# Patient Record
Sex: Female | Born: 1940 | Race: White | Hispanic: No | Marital: Married | Smoking: Never smoker
Health system: Southern US, Community
[De-identification: ages and names within clinical notes are randomized; demographics above are authoritative.]

## PROBLEM LIST (undated history)

## (undated) DIAGNOSIS — S42292D Other displaced fracture of upper end of left humerus, subsequent encounter for fracture with routine healing: Secondary | ICD-10-CM

## (undated) DIAGNOSIS — C439 Malignant melanoma of skin, unspecified: Secondary | ICD-10-CM

## (undated) DIAGNOSIS — G20 Parkinson's disease: Secondary | ICD-10-CM

## (undated) DIAGNOSIS — C50919 Malignant neoplasm of unspecified site of unspecified female breast: Secondary | ICD-10-CM

## (undated) DIAGNOSIS — G20A1 Parkinson's disease without dyskinesia, without mention of fluctuations: Secondary | ICD-10-CM

## (undated) DIAGNOSIS — C4499 Other specified malignant neoplasm of skin, unspecified: Secondary | ICD-10-CM

## (undated) DIAGNOSIS — N952 Postmenopausal atrophic vaginitis: Secondary | ICD-10-CM

## (undated) DIAGNOSIS — C50412 Malignant neoplasm of upper-outer quadrant of left female breast: Secondary | ICD-10-CM

## (undated) DIAGNOSIS — Z923 Personal history of irradiation: Secondary | ICD-10-CM

## (undated) DIAGNOSIS — R232 Flushing: Secondary | ICD-10-CM

## (undated) DIAGNOSIS — I639 Cerebral infarction, unspecified: Secondary | ICD-10-CM

## (undated) DIAGNOSIS — K219 Gastro-esophageal reflux disease without esophagitis: Secondary | ICD-10-CM

## (undated) DIAGNOSIS — I10 Essential (primary) hypertension: Secondary | ICD-10-CM

## (undated) DIAGNOSIS — N3281 Overactive bladder: Secondary | ICD-10-CM

## (undated) DIAGNOSIS — E7800 Pure hypercholesterolemia, unspecified: Secondary | ICD-10-CM

## (undated) HISTORY — DX: Parkinson's disease without dyskinesia, without mention of fluctuations: G20.A1

## (undated) HISTORY — PX: BRAIN SURGERY: SHX531

## (undated) HISTORY — DX: Other displaced fracture of upper end of left humerus, subsequent encounter for fracture with routine healing: S42.292D

## (undated) HISTORY — DX: Overactive bladder: N32.81

## (undated) HISTORY — DX: Malignant melanoma of skin, unspecified: C43.9

## (undated) HISTORY — DX: Flushing: R23.2

## (undated) HISTORY — DX: Other specified malignant neoplasm of skin, unspecified: C44.99

## (undated) HISTORY — DX: Parkinson's disease: G20

## (undated) HISTORY — DX: Essential (primary) hypertension: I10

## (undated) HISTORY — PX: OTHER SURGICAL HISTORY: SHX169

## (undated) HISTORY — DX: Pure hypercholesterolemia, unspecified: E78.00

## (undated) HISTORY — DX: Malignant neoplasm of upper-outer quadrant of left female breast: C50.412

## (undated) HISTORY — DX: Malignant neoplasm of unspecified site of unspecified female breast: C50.919

## (undated) HISTORY — DX: Postmenopausal atrophic vaginitis: N95.2

## (undated) HISTORY — PX: TONSILLECTOMY: SUR1361

---

## 1997-12-06 ENCOUNTER — Other Ambulatory Visit: Admission: RE | Admit: 1997-12-06 | Discharge: 1997-12-06 | Payer: Self-pay | Admitting: Obstetrics and Gynecology

## 1998-08-11 DIAGNOSIS — S42292D Other displaced fracture of upper end of left humerus, subsequent encounter for fracture with routine healing: Secondary | ICD-10-CM

## 1998-08-11 HISTORY — DX: Other displaced fracture of upper end of left humerus, subsequent encounter for fracture with routine healing: S42.292D

## 1998-12-10 ENCOUNTER — Other Ambulatory Visit: Admission: RE | Admit: 1998-12-10 | Discharge: 1998-12-10 | Payer: Self-pay | Admitting: Obstetrics and Gynecology

## 1999-12-19 ENCOUNTER — Other Ambulatory Visit: Admission: RE | Admit: 1999-12-19 | Discharge: 1999-12-19 | Payer: Self-pay | Admitting: Obstetrics and Gynecology

## 2000-02-25 ENCOUNTER — Encounter: Payer: Self-pay | Admitting: Obstetrics and Gynecology

## 2000-02-25 ENCOUNTER — Encounter: Admission: RE | Admit: 2000-02-25 | Discharge: 2000-02-25 | Payer: Self-pay | Admitting: Obstetrics and Gynecology

## 2000-12-29 ENCOUNTER — Other Ambulatory Visit: Admission: RE | Admit: 2000-12-29 | Discharge: 2000-12-29 | Payer: Self-pay | Admitting: Obstetrics and Gynecology

## 2001-03-02 ENCOUNTER — Encounter: Admission: RE | Admit: 2001-03-02 | Discharge: 2001-03-02 | Payer: Self-pay | Admitting: Obstetrics and Gynecology

## 2001-03-02 ENCOUNTER — Encounter: Payer: Self-pay | Admitting: Obstetrics and Gynecology

## 2001-12-30 ENCOUNTER — Other Ambulatory Visit: Admission: RE | Admit: 2001-12-30 | Discharge: 2001-12-30 | Payer: Self-pay | Admitting: Obstetrics and Gynecology

## 2002-03-07 ENCOUNTER — Encounter: Payer: Self-pay | Admitting: Obstetrics and Gynecology

## 2002-03-07 ENCOUNTER — Encounter: Admission: RE | Admit: 2002-03-07 | Discharge: 2002-03-07 | Payer: Self-pay | Admitting: Obstetrics and Gynecology

## 2002-08-11 HISTORY — PX: FOOT SURGERY: SHX648

## 2003-01-02 ENCOUNTER — Other Ambulatory Visit: Admission: RE | Admit: 2003-01-02 | Discharge: 2003-01-02 | Payer: Self-pay | Admitting: Obstetrics and Gynecology

## 2003-03-09 ENCOUNTER — Encounter: Admission: RE | Admit: 2003-03-09 | Discharge: 2003-03-09 | Payer: Self-pay | Admitting: Obstetrics and Gynecology

## 2003-03-09 ENCOUNTER — Encounter: Payer: Self-pay | Admitting: Obstetrics and Gynecology

## 2004-01-10 ENCOUNTER — Other Ambulatory Visit: Admission: RE | Admit: 2004-01-10 | Discharge: 2004-01-10 | Payer: Self-pay | Admitting: Obstetrics and Gynecology

## 2004-02-14 ENCOUNTER — Other Ambulatory Visit: Admission: RE | Admit: 2004-02-14 | Discharge: 2004-02-14 | Payer: Self-pay | Admitting: Obstetrics and Gynecology

## 2004-03-18 ENCOUNTER — Encounter: Admission: RE | Admit: 2004-03-18 | Discharge: 2004-03-18 | Payer: Self-pay | Admitting: Obstetrics and Gynecology

## 2004-08-06 ENCOUNTER — Other Ambulatory Visit: Admission: RE | Admit: 2004-08-06 | Discharge: 2004-08-06 | Payer: Self-pay | Admitting: Obstetrics and Gynecology

## 2005-01-28 ENCOUNTER — Other Ambulatory Visit: Admission: RE | Admit: 2005-01-28 | Discharge: 2005-01-28 | Payer: Self-pay | Admitting: Obstetrics and Gynecology

## 2005-04-08 ENCOUNTER — Encounter: Admission: RE | Admit: 2005-04-08 | Discharge: 2005-04-08 | Payer: Self-pay | Admitting: Obstetrics and Gynecology

## 2005-08-11 HISTORY — PX: KNEE SURGERY: SHX244

## 2006-02-02 ENCOUNTER — Other Ambulatory Visit: Admission: RE | Admit: 2006-02-02 | Discharge: 2006-02-02 | Payer: Self-pay | Admitting: Obstetrics and Gynecology

## 2006-02-02 ENCOUNTER — Encounter: Payer: Self-pay | Admitting: Vascular Surgery

## 2006-02-02 ENCOUNTER — Ambulatory Visit (HOSPITAL_COMMUNITY): Admission: RE | Admit: 2006-02-02 | Discharge: 2006-02-02 | Payer: Self-pay | Admitting: Obstetrics and Gynecology

## 2006-02-13 ENCOUNTER — Ambulatory Visit (HOSPITAL_COMMUNITY): Admission: RE | Admit: 2006-02-13 | Discharge: 2006-02-13 | Payer: Self-pay | Admitting: Orthopedic Surgery

## 2006-02-13 ENCOUNTER — Encounter (INDEPENDENT_AMBULATORY_CARE_PROVIDER_SITE_OTHER): Payer: Self-pay | Admitting: *Deleted

## 2006-05-06 ENCOUNTER — Encounter: Admission: RE | Admit: 2006-05-06 | Discharge: 2006-05-06 | Payer: Self-pay | Admitting: Obstetrics and Gynecology

## 2007-04-28 ENCOUNTER — Other Ambulatory Visit: Admission: RE | Admit: 2007-04-28 | Discharge: 2007-04-28 | Payer: Self-pay | Admitting: Obstetrics and Gynecology

## 2007-05-26 ENCOUNTER — Encounter: Admission: RE | Admit: 2007-05-26 | Discharge: 2007-05-26 | Payer: Self-pay | Admitting: Obstetrics and Gynecology

## 2007-10-07 ENCOUNTER — Ambulatory Visit: Payer: Self-pay | Admitting: Cardiology

## 2008-06-07 ENCOUNTER — Encounter: Admission: RE | Admit: 2008-06-07 | Discharge: 2008-06-07 | Payer: Self-pay | Admitting: Obstetrics and Gynecology

## 2008-06-07 ENCOUNTER — Ambulatory Visit: Payer: Self-pay | Admitting: Obstetrics and Gynecology

## 2009-06-08 ENCOUNTER — Encounter: Payer: Self-pay | Admitting: Obstetrics and Gynecology

## 2009-06-08 ENCOUNTER — Ambulatory Visit: Payer: Self-pay | Admitting: Obstetrics and Gynecology

## 2009-06-08 ENCOUNTER — Encounter: Admission: RE | Admit: 2009-06-08 | Discharge: 2009-06-08 | Payer: Self-pay | Admitting: Obstetrics and Gynecology

## 2009-06-08 ENCOUNTER — Other Ambulatory Visit: Admission: RE | Admit: 2009-06-08 | Discharge: 2009-06-08 | Payer: Self-pay | Admitting: Obstetrics and Gynecology

## 2010-03-27 ENCOUNTER — Ambulatory Visit: Payer: Self-pay | Admitting: Women's Health

## 2010-04-11 ENCOUNTER — Ambulatory Visit: Payer: Self-pay | Admitting: Obstetrics and Gynecology

## 2010-04-29 ENCOUNTER — Ambulatory Visit: Payer: Self-pay | Admitting: Obstetrics and Gynecology

## 2010-06-11 ENCOUNTER — Ambulatory Visit: Payer: Self-pay | Admitting: Obstetrics and Gynecology

## 2010-07-01 ENCOUNTER — Encounter: Admission: RE | Admit: 2010-07-01 | Discharge: 2010-07-01 | Payer: Self-pay | Admitting: Obstetrics and Gynecology

## 2010-07-12 ENCOUNTER — Encounter: Admission: RE | Admit: 2010-07-12 | Discharge: 2010-07-12 | Payer: Self-pay | Admitting: Obstetrics and Gynecology

## 2010-07-12 IMAGING — MG MM DIGITAL DIAGNOSTIC LIMITED*R*
2 series · 2 of 2 positions shown · non-contrast
Comparison: Previous examinations, including the screening
mammogram dated [DATE].

CLINICAL DATA: Possible right breast mass at recent screening
mammography.

 RIGHT DIGITAL DIAGNOSTIC MAMMOGRAM

[R CC]
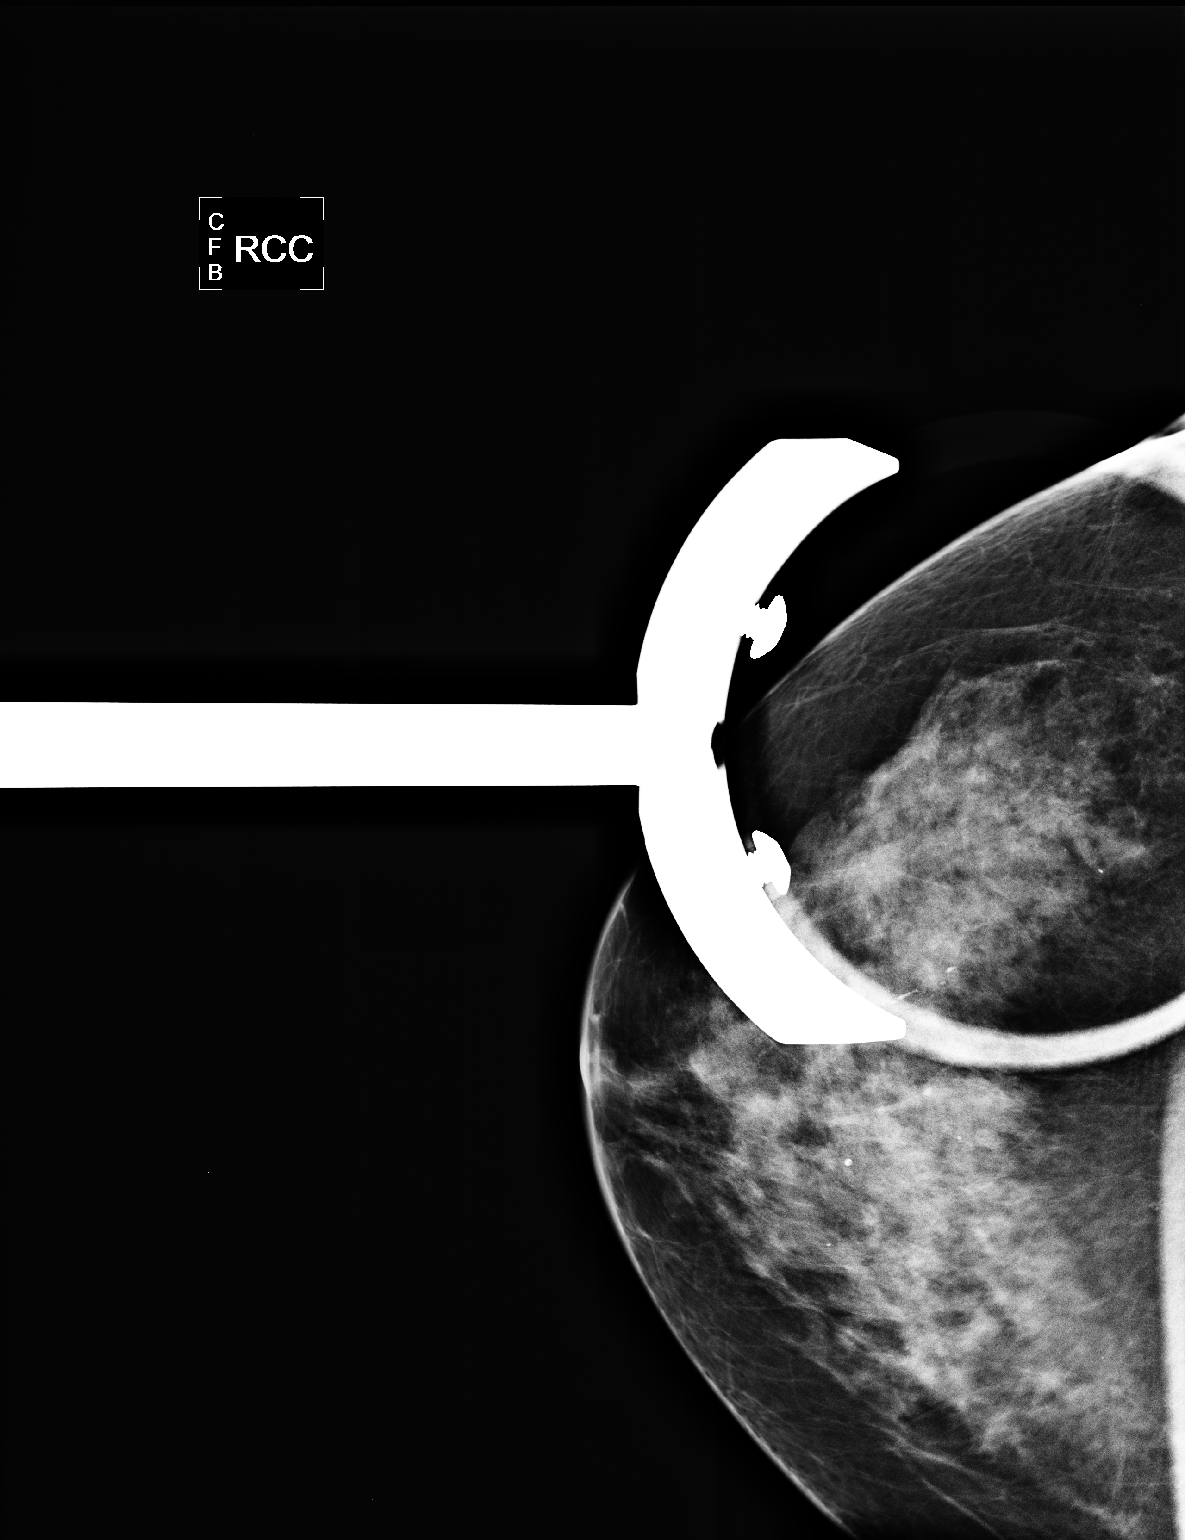

[R MLO]
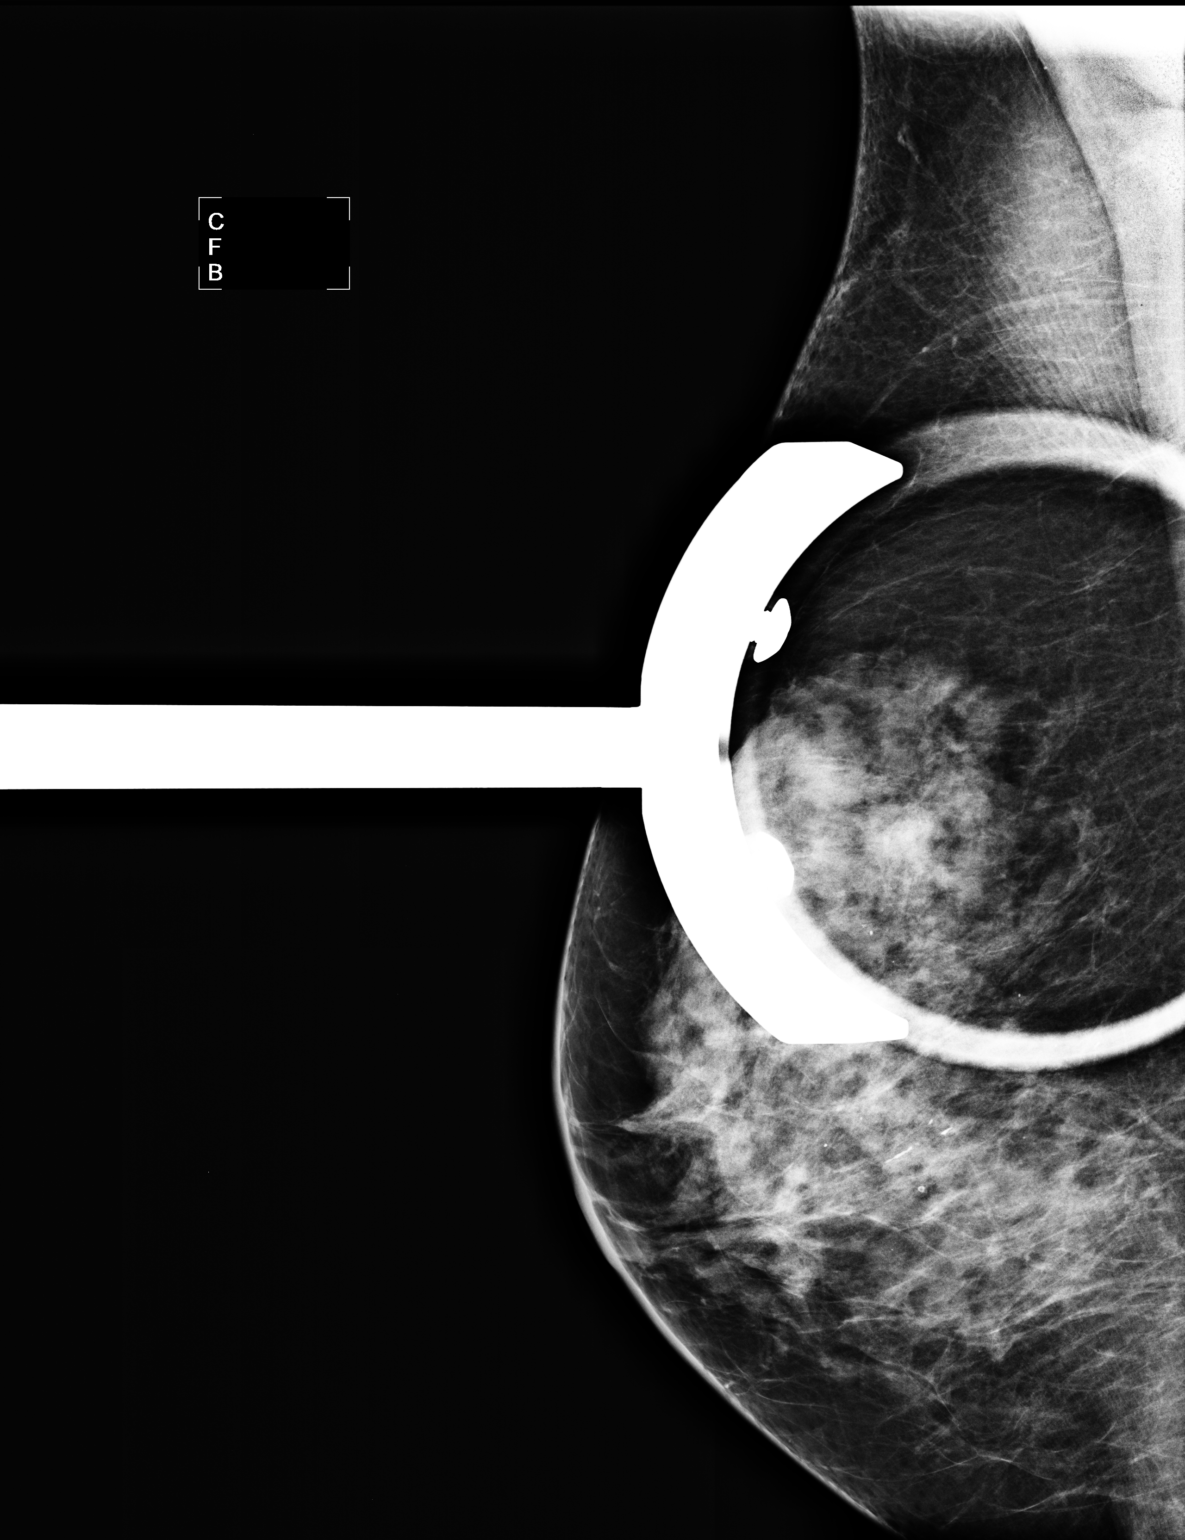

[2 of 2 positions shown; findings below may reference images not displayed]

FINDINGS: Spot compression views of the right breast demonstrate
normal appearing parenchyma at the location of the recently
suspected mass.  No true mass or other findings suspicious for
malignancy are seen.
IMPRESSION: No evidence of malignancy.  The recently suspected right breast
mass represented overlapping of normal tissue.  Annual screening
mammography is recommended.

Bi-Rads Category 1: Negative

## 2010-09-01 ENCOUNTER — Encounter: Payer: Self-pay | Admitting: Obstetrics and Gynecology

## 2010-12-24 NOTE — Assessment & Plan Note (Signed)
[facility]                            CARDIOLOGY OFFICE NOTE   ALANIA, OVERHOLT                      MRN:          [id number]  DATE:10/07/2007                            DOB:          [birth year]    PRIMARY:  Paulita Cradle, nurse practitioner.   REASON FOR PRESENTATION:  Evaluate the patient with chest discomfort and  palpitations.   HISTORY OF PRESENT ILLNESS:  The patient is 70 year old white female.  She had been having some chest discomfort on and off for several days.  She presented last Thursday to [place].  She described a dull  discomfort.  It was somewhat sore for her to move.  It was substernal.  It was episodic.  It was moderate.  It was not associated with nausea,  vomiting or diaphoresis.  It did radiate very slightly into her neck and  down into her arms.  She could not bring this on with activity.  She  dances and was able to do this without bringing it on.  She was given  Nexium which she has been taking for a week.  Said around Monday this  discomfort stopped.  She was able to dance since then again not having  any of this discomfort.  She is also describing palpitations apparently.  However, she does not describe it as such.  She describes it rather to  me, as the chest discomfort.  She is not having any presyncope or  syncope.  She has had a monitor.  She has pressed this 7 times and has  been told It was okay.   PAST MEDICAL HISTORY:  Hyperlipidemia x 8 years, hypertension x 20  years, melanoma recently resected from both legs.   PAST SURGICAL HISTORY:  Left knee torn ligament, melanoma resected as  above.   ALLERGIES:  None.   MEDICATIONS:  Glucosamine, calcium, aspirin 81 mg daily, lisinopril 10  mg daily, simvastatin 40 mg, Nexium 40 mg daily, doxycycline, Ditropan.   SOCIAL HISTORY:  The patient retired.  She is married.  She has no  children.  She has never smoked cigarettes and does not drink  alcohol.   FAMILY HISTORY:  Noncontributory for early coronary artery disease.  Mother died of CVA at 6.   REVIEW OF SYSTEMS:  As stated in the HPI, positive for mild cough,  positive for hiatal hernia, positive for mild edema and lower extremity.  Negative for all other systems.   PHYSICAL EXAMINATION:  The patient is in no distress.  Blood pressure 125 of 70, heart rate 88 and regular, weight 160 pounds,  body mass index 28.  HEENT:  Eyelids unremarkable.  Pupils are equal, round, and reactive to  light and accommodation.  Fundi are not visualized.  Oral mucosa  unremarkable.  NECK:  No jugular venous distension at 45 degrees, carotid upstroke  brisk and symmetric, no bruits, thyromegaly.  LYMPHATICS:  No cervical, axillary, or inguinal adenopathy.  LUNGS:  Clear to auscultation bilaterally.  BACK:  No costovertebral angle tenderness.  CHEST:  Unremarkable.  HEART:  PMI not displaced or sustained, S1  and S2 within normal limits,  no S3, no S4, no clicks, rubs, murmurs.  ABDOMEN:  Flat, positive bowel sounds, normal in frequency and pitch, no  bruits, rebound, guarding.  No midline pulsatile masses, hepatomegaly,  splenomegaly.  SKIN:  No rashes, no nodules.  EXTREMITIES:  With 2+ pulses throughout, no edema, cyanosis, clubbing.  NEURO:  Oriented to person, place, and time, cranial nerves 2-12 grossly  intact, motor grossly intact.    EKG sinus rhythm, rate 84, axis within normal limits, intervals within  normal limits, poor anterior R wave progression, no specific ST-T wave  changes.   ASSESSMENT/PLAN:  1. Chest discomfort, the patient's chest discomfort is atypical.      Actually went away with Nexium.  I think the pretest probability of      obstructive coronary disease is low.  However, given her risk      factors,  I think that exercising her with a POET (plain old      exercise treadmill test) would be reasonable.  She can have this      arranged through [facility].  2. Palpitations.  She did she not had any apparent events of abnormal      rhythm noted though she is press the device 7 times.  I would be      happy reviewed the strips.  I think she could discontinue this at      about 2 weeks if it has been unrevealing.  3. Dyslipidemia per Dr. Christell Constant and Paulita Cradle.  4. Hypertension a blood pressure is elevated today.  However, states      at home it is 120s over 70s.  She probably does have some white      coat hypertension.  Looked at her last readings at [facility] and it is fine.  Therefore, she will continue      medications as listed.  5. Weight.  She does have body mass index that puts her in overweight      range.  She understands in the lose weight with an exercise.  6. Follow-up will be as needed.     Rollene Rotunda, MD, [facility]  Electronically Signed    JH/MedQ  DD: 10/07/2007  DT: 10/08/2007  Job #: [id number]   cc:   Paulita Cradle, NP

## 2011-06-03 ENCOUNTER — Encounter: Payer: Self-pay | Admitting: Obstetrics and Gynecology

## 2011-06-06 ENCOUNTER — Other Ambulatory Visit: Payer: Self-pay | Admitting: Obstetrics and Gynecology

## 2011-06-06 DIAGNOSIS — Z1231 Encounter for screening mammogram for malignant neoplasm of breast: Secondary | ICD-10-CM

## 2011-06-09 ENCOUNTER — Encounter: Payer: Self-pay | Admitting: Obstetrics and Gynecology

## 2011-07-09 ENCOUNTER — Encounter: Payer: Self-pay | Admitting: *Deleted

## 2011-07-09 DIAGNOSIS — N952 Postmenopausal atrophic vaginitis: Secondary | ICD-10-CM | POA: Insufficient documentation

## 2011-07-09 DIAGNOSIS — I10 Essential (primary) hypertension: Secondary | ICD-10-CM | POA: Insufficient documentation

## 2011-07-09 DIAGNOSIS — N3281 Overactive bladder: Secondary | ICD-10-CM | POA: Insufficient documentation

## 2011-07-09 DIAGNOSIS — C439 Malignant melanoma of skin, unspecified: Secondary | ICD-10-CM | POA: Insufficient documentation

## 2011-07-09 DIAGNOSIS — C4499 Other specified malignant neoplasm of skin, unspecified: Secondary | ICD-10-CM | POA: Insufficient documentation

## 2011-07-16 ENCOUNTER — Other Ambulatory Visit (HOSPITAL_COMMUNITY)
Admission: RE | Admit: 2011-07-16 | Discharge: 2011-07-16 | Disposition: A | Discharge status: Home / Self Care | Payer: Medicare Other | Source: Ambulatory Visit | Attending: Obstetrics and Gynecology | Admitting: Obstetrics and Gynecology

## 2011-07-16 ENCOUNTER — Encounter: Payer: Self-pay | Admitting: Obstetrics and Gynecology

## 2011-07-16 ENCOUNTER — Ambulatory Visit (INDEPENDENT_AMBULATORY_CARE_PROVIDER_SITE_OTHER): Payer: Medicare Other | Admitting: Obstetrics and Gynecology

## 2011-07-16 ENCOUNTER — Ambulatory Visit
Admission: RE | Admit: 2011-07-16 | Discharge: 2011-07-16 | Disposition: A | Discharge status: Home / Self Care | Payer: Medicare Other | Source: Ambulatory Visit | Attending: Obstetrics and Gynecology | Admitting: Obstetrics and Gynecology

## 2011-07-16 VITALS — BP 124/74 | Ht 63.0 in | Wt 157.0 lb

## 2011-07-16 DIAGNOSIS — N951 Menopausal and female climacteric states: Secondary | ICD-10-CM

## 2011-07-16 DIAGNOSIS — M949 Disorder of cartilage, unspecified: Secondary | ICD-10-CM

## 2011-07-16 DIAGNOSIS — Z1231 Encounter for screening mammogram for malignant neoplasm of breast: Secondary | ICD-10-CM

## 2011-07-16 DIAGNOSIS — Z01419 Encounter for gynecological examination (general) (routine) without abnormal findings: Secondary | ICD-10-CM | POA: Insufficient documentation

## 2011-07-16 DIAGNOSIS — M8580 Other specified disorders of bone density and structure, unspecified site: Secondary | ICD-10-CM

## 2011-07-16 DIAGNOSIS — R251 Tremor, unspecified: Secondary | ICD-10-CM

## 2011-07-16 DIAGNOSIS — N952 Postmenopausal atrophic vaginitis: Secondary | ICD-10-CM

## 2011-07-16 DIAGNOSIS — M899 Disorder of bone, unspecified: Secondary | ICD-10-CM

## 2011-07-16 DIAGNOSIS — R259 Unspecified abnormal involuntary movements: Secondary | ICD-10-CM

## 2011-07-16 DIAGNOSIS — B373 Candidiasis of vulva and vagina: Secondary | ICD-10-CM

## 2011-07-16 DIAGNOSIS — Z124 Encounter for screening for malignant neoplasm of cervix: Secondary | ICD-10-CM

## 2011-07-16 DIAGNOSIS — Z780 Asymptomatic menopausal state: Secondary | ICD-10-CM

## 2011-07-16 DIAGNOSIS — B3731 Acute candidiasis of vulva and vagina: Secondary | ICD-10-CM

## 2011-07-16 MED ORDER — TERCONAZOLE 0.8 % VA CREA: 1.0000 | TOPICAL_CREAM | Freq: Every day | VAGINAL | Status: AC

## 2011-07-16 NOTE — Progress Notes (Signed)
Patient came to see me today for further followup. For many years she took systemic estrogen for menopausal symptoms. Since she stopped it she has been somewhat symptomatic especially in the summer. She is also developed vaginal dryness but she uses estrogen cream for that she didn't have when she was on oral estrogen. She is scheduled for a mammogram today. Her bone density done this year showed significant osteopenia with an elevated FRAX risk. Her PCP who did it did not recommend treatment but instead suggested she have it repeated in one year. She is taking calcium and vitamin D. She exercises. She is a nonsmoker nondrinker. She did have one fracture of her shoulder and when she described to me it does not sound like her fragility fracture. In addition she is having vulvar and vaginal itching. She also has developed a tremor and her PCP was going to refer her to a neurologist but never did. She had heard of operation she can have if it is an essential tremor.  ROS: 12 system review done. See above for pertinent positives. In addition other positives include hyperlipidemia on medication, history of basal cell skin cancer and melanoma without current issues.  Physical examination:  Tracey Juarez present HEENT within normal limits. Neck: Thyroid not large. No masses. Supraclavicular nodes: not enlarged. Breasts: Examined in both sitting midline position. No skin changes and no masses. Abdomen: Soft no guarding rebound or masses or hernia. Pelvic: External: Within normal limits. BUS: Within normal limits. Vaginal:within normal limits. Good estrogen effect. No evidence of cystocele rectocele or enterocele. Cervix: clean. Uterus: Normal size and shape. Adnexa: No masses. Rectovaginal exam: Confirmatory and negative. Extremities: Within normal limits. Obvious tremor of hands. 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  Assessment: #1. Menopausal symptoms #2. Tremor. #3. Significant  osteopenia with elevated FRAX risk #4. Yeast vaginitis  Plan: Had a very long discussion about systemic HRT. I told her I be willing to treat her would like to use estrogen patch and the rationale for this. We discussed CombiPatch, Climara pro, or Climara with medroxyprogesterone. She will checked on costs. Encouraged her to see a neurologist. Terconazole 3 cream for yeast infection. Mammogram. Discussed biphosphonate therapy. Explained HRT would be another way to treat her osteopenia. Discussed slightly higher risk of breast cancer with HRT.

## 2011-07-16 NOTE — Patient Instructions (Signed)
Patient to look into costs of different patches and let me know.

## 2011-07-29 ENCOUNTER — Telehealth: Payer: Self-pay | Admitting: *Deleted

## 2011-07-29 MED ORDER — ESTRADIOL-NORETHINDRONE ACET 0.05-0.14 MG/DAY TD PTTW: 1.0000 | MEDICATED_PATCH | TRANSDERMAL | Status: DC

## 2011-07-29 NOTE — Telephone Encounter (Signed)
Patient informed rx sent.

## 2011-07-29 NOTE — Telephone Encounter (Signed)
CombiPatch 0.05/.14 milligrams. Applied twice a week to buttocks. Move  patch each time. Space out(i.e Monday-Thursday)

## 2011-07-29 NOTE — Telephone Encounter (Signed)
Addended byMckinley Jewel, Nnaemeka Samson L on: 07/29/2011 11:04 AM   Modules accepted: Orders

## 2011-07-29 NOTE — Telephone Encounter (Signed)
Patient said she would like for [place] to call in Combipatch for her.  Didn't see the strength in chart.  Can we call in?

## 2011-09-05 DIAGNOSIS — H04129 Dry eye syndrome of unspecified lacrimal gland: Secondary | ICD-10-CM | POA: Diagnosis not present

## 2011-09-17 DIAGNOSIS — C44519 Basal cell carcinoma of skin of other part of trunk: Secondary | ICD-10-CM | POA: Diagnosis not present

## 2011-09-17 DIAGNOSIS — L570 Actinic keratosis: Secondary | ICD-10-CM | POA: Diagnosis not present

## 2011-09-17 DIAGNOSIS — Z85828 Personal history of other malignant neoplasm of skin: Secondary | ICD-10-CM | POA: Diagnosis not present

## 2011-09-17 DIAGNOSIS — D485 Neoplasm of uncertain behavior of skin: Secondary | ICD-10-CM | POA: Diagnosis not present

## 2011-09-24 DIAGNOSIS — I10 Essential (primary) hypertension: Secondary | ICD-10-CM | POA: Diagnosis not present

## 2011-09-24 DIAGNOSIS — M899 Disorder of bone, unspecified: Secondary | ICD-10-CM | POA: Diagnosis not present

## 2011-09-24 DIAGNOSIS — E785 Hyperlipidemia, unspecified: Secondary | ICD-10-CM | POA: Diagnosis not present

## 2011-09-24 DIAGNOSIS — E559 Vitamin D deficiency, unspecified: Secondary | ICD-10-CM | POA: Diagnosis not present

## 2011-09-24 DIAGNOSIS — R259 Unspecified abnormal involuntary movements: Secondary | ICD-10-CM | POA: Diagnosis not present

## 2011-09-25 DIAGNOSIS — C44519 Basal cell carcinoma of skin of other part of trunk: Secondary | ICD-10-CM | POA: Diagnosis not present

## 2011-10-27 DIAGNOSIS — E785 Hyperlipidemia, unspecified: Secondary | ICD-10-CM | POA: Diagnosis not present

## 2011-10-27 DIAGNOSIS — R259 Unspecified abnormal involuntary movements: Secondary | ICD-10-CM | POA: Diagnosis not present

## 2011-10-27 DIAGNOSIS — I658 Occlusion and stenosis of other precerebral arteries: Secondary | ICD-10-CM | POA: Diagnosis not present

## 2011-10-27 DIAGNOSIS — G20 Parkinson's disease: Secondary | ICD-10-CM | POA: Diagnosis not present

## 2011-12-29 DIAGNOSIS — H04129 Dry eye syndrome of unspecified lacrimal gland: Secondary | ICD-10-CM | POA: Diagnosis not present

## 2012-01-01 DIAGNOSIS — E559 Vitamin D deficiency, unspecified: Secondary | ICD-10-CM | POA: Diagnosis not present

## 2012-01-01 DIAGNOSIS — E039 Hypothyroidism, unspecified: Secondary | ICD-10-CM | POA: Diagnosis not present

## 2012-01-01 DIAGNOSIS — I10 Essential (primary) hypertension: Secondary | ICD-10-CM | POA: Diagnosis not present

## 2012-01-01 DIAGNOSIS — I6789 Other cerebrovascular disease: Secondary | ICD-10-CM | POA: Diagnosis not present

## 2012-01-01 DIAGNOSIS — E785 Hyperlipidemia, unspecified: Secondary | ICD-10-CM | POA: Diagnosis not present

## 2012-01-07 ENCOUNTER — Ambulatory Visit (INDEPENDENT_AMBULATORY_CARE_PROVIDER_SITE_OTHER): Payer: Medicare Other | Admitting: Obstetrics and Gynecology

## 2012-01-07 DIAGNOSIS — N898 Other specified noninflammatory disorders of vagina: Secondary | ICD-10-CM

## 2012-01-07 DIAGNOSIS — N9489 Other specified conditions associated with female genital organs and menstrual cycle: Secondary | ICD-10-CM

## 2012-01-07 DIAGNOSIS — B373 Candidiasis of vulva and vagina: Secondary | ICD-10-CM

## 2012-01-07 DIAGNOSIS — B3731 Acute candidiasis of vulva and vagina: Secondary | ICD-10-CM | POA: Diagnosis not present

## 2012-01-07 DIAGNOSIS — R102 Pelvic and perineal pain: Secondary | ICD-10-CM

## 2012-01-07 LAB — URINALYSIS W MICROSCOPIC + REFLEX CULTURE
Casts: NONE SEEN
Glucose, UA: NEGATIVE mg/dL
Ketones, ur: NEGATIVE mg/dL
Nitrite: NEGATIVE
Protein, ur: NEGATIVE mg/dL
Specific Gravity, Urine: 1.01 (ref 1.005–1.030)
Urobilinogen, UA: 0.2 mg/dL (ref 0.0–1.0)
WBC, UA: NONE SEEN WBC/hpf (ref <3)
pH: 7 (ref 5.0–8.0)

## 2012-01-07 LAB — WET PREP FOR TRICH, YEAST, CLUE: Clue Cells Wet Prep HPF POC: NONE SEEN

## 2012-01-07 MED ORDER — TERCONAZOLE 0.8 % VA CREA: 1.0000 | TOPICAL_CREAM | Freq: Every day | VAGINAL | Status: AC

## 2012-01-07 NOTE — Progress Notes (Signed)
Patient came to see me today with a 2 week history of lower abdominal pressure, vulva burning, vaginal discharge and some itching. So far she is taken no medication.  Exam: Tracey Juarez  Present. Abdomen is soft without guarding rebound or masses. Pelvic exam: External: 2+ vulvitis. BUS within normal limits. Vaginal exam: poor estrogen effect. Very little discharge. Wet prep negative. Cervix: Clean. Uterus: Within normal limits. Adnexa: Within normal limits. Rectovaginal exam: Within normal limits. No uterine, bladder, or rectal prolapse. Urinalysis negative.  Assessment: Probable yeast vaginitis  Plan: Terconazole 3 cream

## 2012-02-04 DIAGNOSIS — I2510 Atherosclerotic heart disease of native coronary artery without angina pectoris: Secondary | ICD-10-CM | POA: Diagnosis not present

## 2012-02-04 DIAGNOSIS — I10 Essential (primary) hypertension: Secondary | ICD-10-CM | POA: Diagnosis not present

## 2012-03-15 DIAGNOSIS — J020 Streptococcal pharyngitis: Secondary | ICD-10-CM | POA: Diagnosis not present

## 2012-03-15 DIAGNOSIS — H65199 Other acute nonsuppurative otitis media, unspecified ear: Secondary | ICD-10-CM | POA: Diagnosis not present

## 2012-04-14 DIAGNOSIS — L570 Actinic keratosis: Secondary | ICD-10-CM | POA: Diagnosis not present

## 2012-04-14 DIAGNOSIS — Z85828 Personal history of other malignant neoplasm of skin: Secondary | ICD-10-CM | POA: Diagnosis not present

## 2012-04-26 DIAGNOSIS — E559 Vitamin D deficiency, unspecified: Secondary | ICD-10-CM | POA: Diagnosis not present

## 2012-04-26 DIAGNOSIS — E785 Hyperlipidemia, unspecified: Secondary | ICD-10-CM | POA: Diagnosis not present

## 2012-04-26 DIAGNOSIS — I10 Essential (primary) hypertension: Secondary | ICD-10-CM | POA: Diagnosis not present

## 2012-04-27 DIAGNOSIS — R259 Unspecified abnormal involuntary movements: Secondary | ICD-10-CM | POA: Diagnosis not present

## 2012-04-27 DIAGNOSIS — G20 Parkinson's disease: Secondary | ICD-10-CM | POA: Diagnosis not present

## 2012-05-12 DIAGNOSIS — Z23 Encounter for immunization: Secondary | ICD-10-CM | POA: Diagnosis not present

## 2012-05-24 DIAGNOSIS — H40059 Ocular hypertension, unspecified eye: Secondary | ICD-10-CM | POA: Diagnosis not present

## 2012-06-07 ENCOUNTER — Other Ambulatory Visit: Payer: Self-pay | Admitting: Obstetrics and Gynecology

## 2012-06-07 DIAGNOSIS — Z1231 Encounter for screening mammogram for malignant neoplasm of breast: Secondary | ICD-10-CM

## 2012-07-21 ENCOUNTER — Encounter: Payer: Self-pay | Admitting: Obstetrics and Gynecology

## 2012-07-21 ENCOUNTER — Ambulatory Visit (INDEPENDENT_AMBULATORY_CARE_PROVIDER_SITE_OTHER): Payer: Medicare Other | Admitting: Obstetrics and Gynecology

## 2012-07-21 ENCOUNTER — Ambulatory Visit
Admission: RE | Admit: 2012-07-21 | Discharge: 2012-07-21 | Disposition: A | Discharge status: Home / Self Care | Payer: Medicare Other | Source: Ambulatory Visit | Attending: Obstetrics and Gynecology | Admitting: Obstetrics and Gynecology

## 2012-07-21 VITALS — BP 136/84 | Ht 62.5 in | Wt 158.0 lb

## 2012-07-21 DIAGNOSIS — G20A1 Parkinson's disease without dyskinesia, without mention of fluctuations: Secondary | ICD-10-CM | POA: Insufficient documentation

## 2012-07-21 DIAGNOSIS — N951 Menopausal and female climacteric states: Secondary | ICD-10-CM

## 2012-07-21 DIAGNOSIS — G20 Parkinson's disease: Secondary | ICD-10-CM | POA: Insufficient documentation

## 2012-07-21 DIAGNOSIS — M8580 Other specified disorders of bone density and structure, unspecified site: Secondary | ICD-10-CM

## 2012-07-21 DIAGNOSIS — Z1231 Encounter for screening mammogram for malignant neoplasm of breast: Secondary | ICD-10-CM

## 2012-07-21 DIAGNOSIS — N3941 Urge incontinence: Secondary | ICD-10-CM

## 2012-07-21 DIAGNOSIS — R232 Flushing: Secondary | ICD-10-CM

## 2012-07-21 DIAGNOSIS — N952 Postmenopausal atrophic vaginitis: Secondary | ICD-10-CM | POA: Diagnosis not present

## 2012-07-21 DIAGNOSIS — R350 Frequency of micturition: Secondary | ICD-10-CM

## 2012-07-21 DIAGNOSIS — M949 Disorder of cartilage, unspecified: Secondary | ICD-10-CM

## 2012-07-21 DIAGNOSIS — M899 Disorder of bone, unspecified: Secondary | ICD-10-CM

## 2012-07-21 MED ORDER — MEDROXYPROGESTERONE ACETATE 2.5 MG PO TABS: 2.5000 mg | ORAL_TABLET | Freq: Every day | ORAL | Status: DC

## 2012-07-21 MED ORDER — ESTRADIOL 0.05 MG/24HR TD PTWK: 1.0000 | MEDICATED_PATCH | TRANSDERMAL | Status: DC

## 2012-07-21 NOTE — Progress Notes (Signed)
Patient came back to see me today for further followup. She is using a CombiPatch for control of both menopausal symptoms and vaginal dryness. She is very happy on it. She is having no bleeding with it. She is having no pelvic pain. It has however become expensive. She has been treated with oxybutin for urinary urgency and incontinence with excellent results and no side effects. She has scheduled her yearly mammogram. She does her bone density through PCP. She has osteopenia and is taking calcium and vitamin D. She has had no fractures. She has always had normal Pap smears. Her last Pap smear was 2012.  ROS: 12 system review done. Pertinent positives above. Other positives include Hypertension, Parkinson's disease and melanoma.  Physical examination:Kim Julian Reil present. HEENT within normal limits. Neck: Thyroid not large. No masses. Supraclavicular nodes: not enlarged. Breasts: Examined in both sitting and lying  position. No skin changes and no masses. Abdomen: Soft no guarding rebound or masses or hernia. Pelvic: External: Within normal limits. BUS: Within normal limits. Vaginal:within normal limits. Good estrogen effect. No evidence of cystocele rectocele or enterocele. Cervix: clean. Uterus: Normal size and shape. Adnexa: No masses. Rectovaginal exam: Confirmatory and negative. Extremities: Within normal limits.  Assessment: #1. Menopausal symptoms #2. Atrophic vaginitis #3. Detrussor instability #4. Osteopenia  Plan: Switched patient to Climara patch with oral progesterone for cost-effectiveness. We discussed higher risk of breast cancer with HRT.Consider estrogen-serm when available next year. Pap not done.The new Pap smear guidelines were discussed with the patient. Continue oxybutynin. Continue yearly mammograms. Continue periodic bone densities.

## 2012-07-21 NOTE — Patient Instructions (Signed)
Continue yearly mammograms. Continue periodic bone densities.

## 2012-08-09 DIAGNOSIS — J029 Acute pharyngitis, unspecified: Secondary | ICD-10-CM | POA: Diagnosis not present

## 2012-08-18 DIAGNOSIS — K219 Gastro-esophageal reflux disease without esophagitis: Secondary | ICD-10-CM | POA: Diagnosis not present

## 2012-08-18 DIAGNOSIS — N318 Other neuromuscular dysfunction of bladder: Secondary | ICD-10-CM | POA: Diagnosis not present

## 2012-08-18 DIAGNOSIS — E559 Vitamin D deficiency, unspecified: Secondary | ICD-10-CM | POA: Diagnosis not present

## 2012-08-18 DIAGNOSIS — I10 Essential (primary) hypertension: Secondary | ICD-10-CM | POA: Diagnosis not present

## 2012-08-18 DIAGNOSIS — E785 Hyperlipidemia, unspecified: Secondary | ICD-10-CM | POA: Diagnosis not present

## 2012-08-25 DIAGNOSIS — Z1212 Encounter for screening for malignant neoplasm of rectum: Secondary | ICD-10-CM | POA: Diagnosis not present

## 2012-09-10 DIAGNOSIS — H16149 Punctate keratitis, unspecified eye: Secondary | ICD-10-CM | POA: Diagnosis not present

## 2012-10-12 DIAGNOSIS — D485 Neoplasm of uncertain behavior of skin: Secondary | ICD-10-CM | POA: Diagnosis not present

## 2012-10-12 DIAGNOSIS — L570 Actinic keratosis: Secondary | ICD-10-CM | POA: Diagnosis not present

## 2012-10-12 DIAGNOSIS — C4359 Malignant melanoma of other part of trunk: Secondary | ICD-10-CM | POA: Diagnosis not present

## 2012-10-12 DIAGNOSIS — Z85828 Personal history of other malignant neoplasm of skin: Secondary | ICD-10-CM | POA: Diagnosis not present

## 2012-10-21 DIAGNOSIS — C4359 Malignant melanoma of other part of trunk: Secondary | ICD-10-CM | POA: Diagnosis not present

## 2012-11-16 DIAGNOSIS — G20 Parkinson's disease: Secondary | ICD-10-CM | POA: Diagnosis not present

## 2012-11-16 DIAGNOSIS — I658 Occlusion and stenosis of other precerebral arteries: Secondary | ICD-10-CM | POA: Diagnosis not present

## 2012-11-16 DIAGNOSIS — R259 Unspecified abnormal involuntary movements: Secondary | ICD-10-CM | POA: Diagnosis not present

## 2013-01-20 ENCOUNTER — Encounter: Payer: Self-pay | Admitting: *Deleted

## 2013-02-02 ENCOUNTER — Telehealth: Payer: Self-pay | Admitting: *Deleted

## 2013-02-02 NOTE — Telephone Encounter (Signed)
Pharmacy faxed prior authorization for estradiol 0.05mg  patch change weekly. Medication approved thru 08/10/13. Pharmacy informed as well.

## 2013-02-16 ENCOUNTER — Ambulatory Visit: Payer: Self-pay | Admitting: Family Medicine

## 2013-03-01 ENCOUNTER — Encounter: Payer: Self-pay | Admitting: Family Medicine

## 2013-03-01 ENCOUNTER — Ambulatory Visit (INDEPENDENT_AMBULATORY_CARE_PROVIDER_SITE_OTHER): Payer: Medicare Other | Admitting: Family Medicine

## 2013-03-01 VITALS — BP 140/70 | HR 78 | Temp 97.0°F | Ht 64.0 in | Wt 161.0 lb

## 2013-03-01 DIAGNOSIS — E785 Hyperlipidemia, unspecified: Secondary | ICD-10-CM | POA: Diagnosis not present

## 2013-03-01 DIAGNOSIS — I10 Essential (primary) hypertension: Secondary | ICD-10-CM | POA: Diagnosis not present

## 2013-03-01 LAB — BASIC METABOLIC PANEL WITH GFR
CO2: 26 mEq/L (ref 19–32)
Chloride: 106 mEq/L (ref 96–112)
Creat: 1.04 mg/dL (ref 0.50–1.10)
GFR, Est African American: 63 mL/min
GFR, Est Non African American: 55 mL/min — ABNORMAL LOW
Glucose, Bld: 95 mg/dL (ref 70–99)

## 2013-03-01 LAB — HEPATIC FUNCTION PANEL
ALT: 20 U/L (ref 0–35)
Alkaline Phosphatase: 48 U/L (ref 39–117)

## 2013-03-01 MED ORDER — PANTOPRAZOLE SODIUM 40 MG PO TBEC: 40.0000 mg | DELAYED_RELEASE_TABLET | Freq: Every day | ORAL | Status: DC

## 2013-03-01 MED ORDER — OXYBUTYNIN CHLORIDE 5 MG PO TABS: 5.0000 mg | ORAL_TABLET | Freq: Two times a day (BID) | ORAL | Status: DC

## 2013-03-01 MED ORDER — LISINOPRIL 10 MG PO TABS: 10.0000 mg | ORAL_TABLET | Freq: Every day | ORAL | Status: DC

## 2013-03-01 NOTE — Addendum Note (Signed)
Addended by: Magdalene River on: 03/01/2013 10:05 AM   Modules accepted: Orders

## 2013-03-01 NOTE — Patient Instructions (Addendum)
Continue current medications Continue aggressive therapeutic lifestyle changes which include diet and exercise Always be careful and don't put yourself at risk of falling Return FOBT Get colonoscopy as planned--- make sure that we get a copy of the report.

## 2013-03-01 NOTE — Progress Notes (Signed)
  Subjective:    Patient ID: Tracey Juarez, female    DOB: October 23, 1940, 72 y.o.   MRN: [id number]  HPI Patient returns to clinic today for followup and management of chronic medical problems. 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 She also has a history of colon polyps and osteopenia. All of her health maintenance issues are up to date. Her last pelvic exam was done in December 2013. According to the paper chart she may need an FOBT. Patient sees a neurologist every 6 months regarding her tremor which she says is gradually getting worse. She is only taking Benadryl for that now.   Review of Systems  Constitutional: Negative.   HENT: Negative.   Eyes: Negative.   Respiratory: Negative.   Cardiovascular: Negative.   Gastrointestinal: Negative.   Endocrine: Negative.   Genitourinary: Negative.   Musculoskeletal: Positive for myalgias (arthritis).  Skin: Negative.   Allergic/Immunologic: Negative.   Neurological: Negative.   Hematological: Negative.   Psychiatric/Behavioral: Negative.        Objective:   Physical Exam BP 140/70  Pulse 78  Temp(Src) 97 F (36.1 C) (Oral)  Ht 5\' 4"  (1.626 m)  Wt 161 lb (73.029 kg)  BMI 27.62 kg/m2  The patient appeared well nourished and normally developed, alert and oriented to time and place. She was calm and pleasant. The tremor was only slightly noticeable during the exam.  Speech, behavior and judgement appear normal. Vital signs as documented.  Head exam is unremarkable. No scleral icterus or pallor noted. Ears nose mouth and throat were within normal limits Neck is without jugular venous distension, thyromegally, or carotid bruits. Carotid upstrokes are brisk bilaterally. No cervical adenopathy. Lungs are clear anteriorly and posteriorly to auscultation. Normal respiratory effort. Cardiac exam reveals regular rate and rhythm at 72 per minute. First and second heart sounds normal.  No murmurs,  rubs or gallops.  Abdominal exam reveals normal bowl sounds, no masses, no organomegaly and no aortic enlargement. No inguinal adenopathy. Extremities are nonedematous and both femoral and pedal pulses are normal. Skin without pallor or jaundice.  Warm and dry, without rash. Neurologic exam reveals normal brisk deep tendon reflexes bilaterally and normal sensation.          Assessment & Plan:  Hypertension - Plan: Hepatic function panel, BASIC METABOLIC PANEL WITH GFR  Other and unspecified hyperlipidemia - Plan: NMR Lipoprofile with Lipids  Tremor -Followup with neurologist as planned  Patient Instructions  Continue current medications Continue aggressive therapeutic lifestyle changes which include diet and exercise Always be careful and don't put yourself at risk of falling Return FOBT Get colonoscopy as planned--- make sure that we get a copy of the report.   medications will be refilled at [facility]--oxybutynin 5, lisinopril 10, and pantoprazole 40   Nyra Capes MD

## 2013-03-02 LAB — NMR LIPOPROFILE WITH LIPIDS
HDL Particle Number: 40.5 umol/L (ref >=30.5)
HDL-C: 47 mg/dL (ref >=40)
LDL (calc): 66 mg/dL (ref <100)
LDL Particle Number: 1259 nmol/L — ABNORMAL HIGH (ref <1000)
Large VLDL-P: 1.3 nmol/L (ref <=2.7)
Small LDL Particle Number: 843 nmol/L — ABNORMAL HIGH (ref <=527)
Triglycerides: 107 mg/dL (ref <150)

## 2013-03-09 ENCOUNTER — Telehealth: Payer: Self-pay | Admitting: Family Medicine

## 2013-03-10 NOTE — Telephone Encounter (Signed)
PATIENT AWARE

## 2013-04-18 DIAGNOSIS — Z85828 Personal history of other malignant neoplasm of skin: Secondary | ICD-10-CM | POA: Diagnosis not present

## 2013-04-18 DIAGNOSIS — L570 Actinic keratosis: Secondary | ICD-10-CM | POA: Diagnosis not present

## 2013-05-09 DIAGNOSIS — Z23 Encounter for immunization: Secondary | ICD-10-CM | POA: Diagnosis not present

## 2013-05-20 DIAGNOSIS — H40059 Ocular hypertension, unspecified eye: Secondary | ICD-10-CM | POA: Diagnosis not present

## 2013-05-25 DIAGNOSIS — R259 Unspecified abnormal involuntary movements: Secondary | ICD-10-CM | POA: Diagnosis not present

## 2013-05-25 DIAGNOSIS — G20 Parkinson's disease: Secondary | ICD-10-CM | POA: Diagnosis not present

## 2013-05-25 DIAGNOSIS — I658 Occlusion and stenosis of other precerebral arteries: Secondary | ICD-10-CM | POA: Diagnosis not present

## 2013-06-10 ENCOUNTER — Other Ambulatory Visit: Payer: Self-pay

## 2013-06-10 DIAGNOSIS — Z1231 Encounter for screening mammogram for malignant neoplasm of breast: Secondary | ICD-10-CM

## 2013-07-25 ENCOUNTER — Ambulatory Visit
Admission: RE | Admit: 2013-07-25 | Discharge: 2013-07-25 | Disposition: A | Discharge status: Home / Self Care | Payer: Medicare Other | Source: Ambulatory Visit

## 2013-07-25 ENCOUNTER — Encounter: Payer: Self-pay | Admitting: Gynecology

## 2013-07-25 ENCOUNTER — Ambulatory Visit (INDEPENDENT_AMBULATORY_CARE_PROVIDER_SITE_OTHER): Payer: Medicare Other | Admitting: Gynecology

## 2013-07-25 VITALS — BP 140/78 | Ht 62.0 in | Wt 162.2 lb

## 2013-07-25 DIAGNOSIS — Z1231 Encounter for screening mammogram for malignant neoplasm of breast: Secondary | ICD-10-CM

## 2013-07-25 DIAGNOSIS — M8580 Other specified disorders of bone density and structure, unspecified site: Secondary | ICD-10-CM

## 2013-07-25 DIAGNOSIS — N952 Postmenopausal atrophic vaginitis: Secondary | ICD-10-CM | POA: Diagnosis not present

## 2013-07-25 DIAGNOSIS — M899 Disorder of bone, unspecified: Secondary | ICD-10-CM

## 2013-07-25 NOTE — Progress Notes (Signed)
Tracey Juarez 1941-01-06 [id number]        72 y.o.  G0P0 for followup exam.  Former patient of Dr. Eda Paschal. Several issues noted below.  Past medical history,surgical history, problem list, medications, allergies, family history and social history were all reviewed and documented in the EPIC chart.  ROS:  Performed and pertinent positives and negatives are included in the history, assessment and plan .  Exam: Sherrilyn Rist assistant Filed Vitals:   07/25/13 1355  BP: 140/78  Height: 5\' 2"  (1.575 m)  Weight: 162 lb 3.2 oz (73.573 kg)   General appearance  Normal Skin grossly normal Head/Neck normal with no cervical or supraclavicular adenopathy thyroid normal Lungs  clear Cardiac RR, without RMG Abdominal  soft, nontender, without masses, organomegaly or hernia Breasts  examined lying and sitting without masses, retractions, discharge or axillary adenopathy. Pelvic  Ext/BUS/vagina  atrophic changes   Cervix  Normal with atrophic changes  Uterus  axial, normal size, shape and contour, midline and mobile nontender   Adnexa  Without masses or tenderness    Anus and perineum  Normal   Rectovaginal  Normal sphincter tone without palpated masses or tenderness.    Assessment/Plan:  72 y.o. G0P0 female for annual exam.   1. Atrophic genital changes. Patient had been on Climara patch with probe ureter 0.5 mg daily. Ran out over a week ago and has not been using.  Overall is doing well without significant hot flashes. Does have an issue with atrophic vaginitis. Over the years she has been on estrogen creams as well as global HRT. I reviewed the whole issue of HRT with her to include the WHI study with increased risk of stroke, heart attack, DVT and breast cancer. The ACOG and NAMS statements for lowest dose for the shortest period of time reviewed. Transdermal versus oral first-pass effect benefit discussed.  After lengthy discussion she wants to try vaginal estrogen formulated cream twice weekly.   Patient knows if she does any bleeding to call. We'll go ahead and start this and see how she does. 2. Pap smear 2012. No Pap smear done today. No history of abnormal Pap smears previously. Discussed current screening guidelines and options to stop screaming altogether she is over the age of 84 versus less frequent screening intervals reviewed. Will readdress on an annual basis. 3. Detrusor instability. Patient on oxybutynin for her primary physician's office and doing well. 4. Mammography today. We'll continue with annual mammography. SBE monthly reviewed. 5. Colonoscopy due now and she knows to schedule this and is making arrangements for this. 6. Osteopenia. DEXA 2012 with T score -2.1 through her primary physician's office. Patient will followup with her primary physician for repeat DEXA as she is due now. Increase calcium vitamin D reviewed. 7. Health maintenance. No blood work done as this is all done through her primary physician's office. Followup in one year, sooner as needed.   Note: This document was prepared with digital dictation and possible smart phrase technology. Any transcriptional errors that result from this process are unintentional.   Dara Lords MD, 2:34 PM 07/25/2013

## 2013-07-25 NOTE — Patient Instructions (Addendum)
Start on vaginal estrogen as we discussed. Call me if you have any issues. [facility]   [address] [place]  [place], [place] [address] [phone]

## 2013-07-26 ENCOUNTER — Telehealth: Payer: Self-pay | Admitting: *Deleted

## 2013-07-26 ENCOUNTER — Encounter: Payer: Self-pay | Admitting: Obstetrics and Gynecology

## 2013-07-26 MED ORDER — NONFORMULARY OR COMPOUNDED ITEM: Status: DC

## 2013-07-26 NOTE — Telephone Encounter (Signed)
rx called in

## 2013-07-26 NOTE — Telephone Encounter (Signed)
Message copied by Aura Camps on Tue Jul 26, 2013  9:56 AM ------      Message from: Dara Lords      Created: Mon Jul 25, 2013  2:42 PM       Call in formulated vaginal estrogen cream #24 one per vagina twice weekly refill x1 year to [facility] ------

## 2013-08-15 ENCOUNTER — Encounter: Payer: Self-pay | Admitting: Family Medicine

## 2013-08-15 ENCOUNTER — Ambulatory Visit (INDEPENDENT_AMBULATORY_CARE_PROVIDER_SITE_OTHER): Payer: Medicare Other | Admitting: Family Medicine

## 2013-08-15 ENCOUNTER — Ambulatory Visit (INDEPENDENT_AMBULATORY_CARE_PROVIDER_SITE_OTHER): Payer: Medicare Other

## 2013-08-15 VITALS — BP 150/69 | HR 81 | Temp 98.3°F | Ht 62.0 in | Wt 162.0 lb

## 2013-08-15 DIAGNOSIS — M899 Disorder of bone, unspecified: Secondary | ICD-10-CM

## 2013-08-15 DIAGNOSIS — G20 Parkinson's disease: Secondary | ICD-10-CM | POA: Diagnosis not present

## 2013-08-15 DIAGNOSIS — I10 Essential (primary) hypertension: Secondary | ICD-10-CM | POA: Diagnosis not present

## 2013-08-15 DIAGNOSIS — Z780 Asymptomatic menopausal state: Secondary | ICD-10-CM

## 2013-08-15 DIAGNOSIS — Z23 Encounter for immunization: Secondary | ICD-10-CM

## 2013-08-15 DIAGNOSIS — E559 Vitamin D deficiency, unspecified: Secondary | ICD-10-CM | POA: Diagnosis not present

## 2013-08-15 DIAGNOSIS — G20A1 Parkinson's disease without dyskinesia, without mention of fluctuations: Secondary | ICD-10-CM

## 2013-08-15 DIAGNOSIS — Z0000 Encounter for general adult medical examination without abnormal findings: Secondary | ICD-10-CM

## 2013-08-15 DIAGNOSIS — M8580 Other specified disorders of bone density and structure, unspecified site: Secondary | ICD-10-CM

## 2013-08-15 DIAGNOSIS — M949 Disorder of cartilage, unspecified: Secondary | ICD-10-CM

## 2013-08-15 LAB — POCT CBC
Granulocyte percent: 71.2 %G (ref 37–80)
HCT, POC: 46.7 % (ref 37.7–47.9)
Hemoglobin: 14.5 g/dL (ref 12.2–16.2)
Lymph, poc: 1.7 (ref 0.6–3.4)
MCH: 28.5 pg (ref 27–31.2)
MCHC: 31.1 g/dL — AB (ref 31.8–35.4)
MCV: 91.4 fL (ref 80–97)
MPV: 7.6 fL (ref 0–99.8)
POC Granulocyte: 4.7 (ref 2–6.9)
POC LYMPH PERCENT: 25.7 %L (ref 10–50)
Platelet Count, POC: 234 10*3/uL (ref 142–424)
RBC: 5.1 M/uL (ref 4.04–5.48)
RDW, POC: 13.5 %
WBC: 6.6 10*3/uL (ref 4.6–10.2)

## 2013-08-15 NOTE — Patient Instructions (Addendum)
Continue current medications. Continue good therapeutic lifestyle changes which include good diet and exercise. Fall precautions discussed with patient. Schedule your flu vaccine if you haven't had it yet If you are over 72 years old - you may need Prevnar 48 or the adult Pneumonia vaccine. Return FOBT The Prevnar vaccine that you received today may make her arm somewhat sore. Be sure and stay physically active walking and exercise will help her Parkinson's disease. Make sure that you take your vitamin D regularly. We will call you with the results of the chest x-ray and the lab work once this results are avail

## 2013-08-15 NOTE — Progress Notes (Signed)
Subjective:    Patient ID: Tracey Juarez, female    DOB: 1940/08/26, 73 y.o.   MRN: [id number]  HPI Pt here for follow up and management of chronic medical problems. Patient has a history of hypertension, hyperlipidemia, hormone replacement therapy, and Parkinson's disease. She will receive a Prevnar vaccination today. She will also get lab work today. She will be given FOBT. She is due to get a chest x-ray and a DEXA scan.       Patient Active Problem List   Diagnosis Date Noted  . Parkinson disease   . Hypertension   . Atrophic vaginitis   . Detrusor instability   . Melanoma   . Mixed basal-squamous cell carcinoma    Outpatient Encounter Prescriptions as of 08/15/2013  Medication Sig  . NONFORMULARY OR COMPOUNDED ITEM Estradiol 0.02% vaginal cream place vaginally twice weekly  . Calcium Carbonate-Vit D-Min (CALTRATE PLUS PO) Take by mouth.    . clopidogrel (PLAVIX) 75 MG tablet Take 75 mg by mouth daily.  . diphenhydrAMINE (BENADRYL) 25 mg capsule Take 25 mg by mouth every 6 (six) hours as needed for itching.  Marland Kitchen lisinopril (PRINIVIL,ZESTRIL) 10 MG tablet Take 1 tablet (10 mg total) by mouth daily.  . Multiple Vitamin (MULTIVITAMIN) capsule Take 1 capsule by mouth daily.    Marland Kitchen oxybutynin (DITROPAN) 5 MG tablet Take 1 tablet (5 mg total) by mouth 2 (two) times daily.  . pantoprazole (PROTONIX) 40 MG tablet Take 1 tablet (40 mg total) by mouth daily.  . simvastatin (ZOCOR) 40 MG tablet Take 40 mg by mouth at bedtime.    . [DISCONTINUED] estradiol (CLIMARA - DOSED IN MG/24 HR) 0.05 mg/24hr Place 1 patch (0.05 mg total) onto the skin once a week.  . [DISCONTINUED] medroxyPROGESTERone (PROVERA) 2.5 MG tablet Take 1 tablet (2.5 mg total) by mouth daily.    Review of Systems  Constitutional: Negative.   HENT: Negative.   Eyes: Negative.   Respiratory: Negative.   Cardiovascular: Negative.   Gastrointestinal: Negative.   Endocrine: Negative.   Genitourinary: Negative.     Musculoskeletal: Negative.   Skin: Negative.   Allergic/Immunologic: Negative.   Neurological: Negative.   Hematological: Negative.   Psychiatric/Behavioral: Negative.        Objective:   Physical Exam  Nursing note and vitals reviewed. Constitutional: She is oriented to person, place, and time. She appears well-developed and well-nourished. No distress.  Calm and pleasant  HENT:  Head: Normocephalic and atraumatic.  Right Ear: External ear normal.  Left Ear: External ear normal.  Nose: Nose normal.  Mouth/Throat: Oropharynx is clear and moist.  Eyes: Conjunctivae and EOM are normal. Pupils are equal, round, and reactive to light. Right eye exhibits no discharge. Left eye exhibits no discharge. No scleral icterus.  Neck: Normal range of motion. Neck supple. No thyromegaly present.  No carotid bruits  Cardiovascular: Normal rate, regular rhythm, normal heart sounds and intact distal pulses.  Exam reveals no gallop and no friction rub.   No murmur heard. At 72 per minute  Pulmonary/Chest: Effort normal and breath sounds normal. No respiratory distress. She has no wheezes. She has no rales. She exhibits no tenderness.  Abdominal: Soft. Bowel sounds are normal. She exhibits no distension and no mass. There is no tenderness. There is no rebound and no guarding.  Musculoskeletal: Normal range of motion. She exhibits no edema and no tenderness.  Kyphosis  Lymphadenopathy:    She has no cervical adenopathy.  Neurological: She is alert  and oriented to person, place, and time. She has normal reflexes.  Slight tremor in the right hand  Skin: Skin is warm and dry.  Psychiatric: She has a normal mood and affect. Her behavior is normal. Judgment and thought content normal.   BP 150/69  Pulse 81  Temp(Src) 98.3 F (36.8 C) (Oral)  Ht '5\' 2"'  (1.575 m)  Wt 162 lb (73.483 kg)  BMI 29.62 kg/m2  [facility] reading (PRIMARY) by  Dr. 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 Hypertension - POCT CBC - BMP8+EGFR - Hepatic function panel - NMR, lipoprofile - DG Chest 2 View; Future  2. Parkinson disease - POCT CBC - DG Chest 2 View; Future  3. Health care maintenance - POCT CBC - DG Chest 2 View; Future -Prevnar vaccine  4. Vitamin D deficiency - Vit D  25 hydroxy (rtn osteoporosis monitoring)  5. Osteopenia -DEXA scan planned  Orders Placed This Encounter  Procedures  . DG Chest 2 View    Standing Status: Future     Number of Occurrences:      Standing Expiration Date: 10/15/2014    Order Specific Question:  Reason for Exam (SYMPTOM  OR DIAGNOSIS REQUIRED)    Answer:  health maintainence    Order Specific Question:  Preferred imaging location?    Answer:  Internal  . BMP8+EGFR  . Hepatic function panel  . NMR, lipoprofile  . Vit D  25 hydroxy (rtn osteoporosis monitoring)  . POCT CBC   No orders of the defined types were placed in this encounter.   Patient Instructions  Continue current medications. Continue good therapeutic lifestyle changes which include good diet and exercise. Fall precautions discussed with patient. Schedule your flu vaccine if you haven't had it yet If you are over 59 years old - you may need Prevnar 40 or the adult Pneumonia vaccine. Return FOBT The Prevnar vaccine that you received today may make her arm somewhat sore. Be sure and stay physically active walking and exercise will help her Parkinson's disease. Make sure that you take your vitamin D regularly. We will call you with the results of the chest x-ray and the lab work once this results are avail    Arrie Senate MD

## 2013-08-16 LAB — NMR, LIPOPROFILE
CHOLESTEROL: 180 mg/dL (ref <200)
HDL Cholesterol by NMR: 61 mg/dL (ref >=40)
HDL PARTICLE NUMBER: 43.1 umol/L (ref >=30.5)
LDL Particle Number: 1823 nmol/L — ABNORMAL HIGH (ref <1000)
LDL SIZE: 20.2 nm — AB (ref >20.5)
LDLC SERPL CALC-MCNC: 91 mg/dL (ref <100)
LP-IR Score: 42 (ref <=45)
SMALL LDL PARTICLE NUMBER: 1165 nmol/L — AB (ref <=527)
TRIGLYCERIDES BY NMR: 138 mg/dL (ref <150)

## 2013-08-16 LAB — HEPATIC FUNCTION PANEL
ALBUMIN: 4.3 g/dL (ref 3.5–4.8)
ALT: 15 IU/L (ref 0–32)
AST: 18 IU/L (ref 0–40)
Alkaline Phosphatase: 64 IU/L (ref 39–117)
BILIRUBIN DIRECT: 0.15 mg/dL (ref 0.00–0.40)
TOTAL PROTEIN: 6.5 g/dL (ref 6.0–8.5)
Total Bilirubin: 0.5 mg/dL (ref 0.0–1.2)

## 2013-08-16 LAB — BMP8+EGFR
BUN/Creatinine Ratio: 13 (ref 11–26)
BUN: 12 mg/dL (ref 8–27)
CALCIUM: 9.5 mg/dL (ref 8.6–10.2)
CO2: 24 mmol/L (ref 18–29)
Chloride: 103 mmol/L (ref 97–108)
Creatinine, Ser: 0.95 mg/dL (ref 0.57–1.00)
GFR calc Af Amer: 70 mL/min/{1.73_m2} (ref >59)
GFR calc non Af Amer: 60 mL/min/{1.73_m2} (ref >59)
GLUCOSE: 98 mg/dL (ref 65–99)
Potassium: 4.5 mmol/L (ref 3.5–5.2)
SODIUM: 143 mmol/L (ref 134–144)

## 2013-08-16 LAB — VITAMIN D 25 HYDROXY (VIT D DEFICIENCY, FRACTURES): Vit D, 25-Hydroxy: 34.4 ng/mL (ref 30.0–100.0)

## 2013-09-28 ENCOUNTER — Ambulatory Visit: Payer: Medicare Other

## 2013-09-28 ENCOUNTER — Other Ambulatory Visit: Payer: Medicare Other

## 2013-10-05 ENCOUNTER — Other Ambulatory Visit: Payer: Medicare Other

## 2013-10-05 ENCOUNTER — Ambulatory Visit: Payer: Medicare Other

## 2013-10-12 DIAGNOSIS — L570 Actinic keratosis: Secondary | ICD-10-CM | POA: Diagnosis not present

## 2013-11-17 DIAGNOSIS — K5730 Diverticulosis of large intestine without perforation or abscess without bleeding: Secondary | ICD-10-CM | POA: Diagnosis not present

## 2013-11-17 DIAGNOSIS — Z86010 Personal history of colonic polyps: Secondary | ICD-10-CM | POA: Diagnosis not present

## 2013-11-17 DIAGNOSIS — Z09 Encounter for follow-up examination after completed treatment for conditions other than malignant neoplasm: Secondary | ICD-10-CM | POA: Diagnosis not present

## 2013-11-30 ENCOUNTER — Encounter: Payer: Self-pay | Admitting: Pharmacist

## 2013-11-30 ENCOUNTER — Ambulatory Visit (INDEPENDENT_AMBULATORY_CARE_PROVIDER_SITE_OTHER): Payer: Medicare Other

## 2013-11-30 ENCOUNTER — Ambulatory Visit (INDEPENDENT_AMBULATORY_CARE_PROVIDER_SITE_OTHER): Payer: Medicare Other | Admitting: Pharmacist

## 2013-11-30 VITALS — Ht 63.0 in | Wt 162.0 lb

## 2013-11-30 DIAGNOSIS — M899 Disorder of bone, unspecified: Secondary | ICD-10-CM

## 2013-11-30 DIAGNOSIS — M8580 Other specified disorders of bone density and structure, unspecified site: Secondary | ICD-10-CM

## 2013-11-30 DIAGNOSIS — M949 Disorder of cartilage, unspecified: Secondary | ICD-10-CM

## 2013-11-30 DIAGNOSIS — Z0000 Encounter for general adult medical examination without abnormal findings: Secondary | ICD-10-CM

## 2013-11-30 DIAGNOSIS — E559 Vitamin D deficiency, unspecified: Secondary | ICD-10-CM

## 2013-11-30 DIAGNOSIS — Z780 Asymptomatic menopausal state: Secondary | ICD-10-CM

## 2013-11-30 LAB — HM DEXA SCAN

## 2013-11-30 MED ORDER — SIMVASTATIN 40 MG PO TABS: 40.0000 mg | ORAL_TABLET | Freq: Every day | ORAL | Status: DC

## 2013-11-30 NOTE — Progress Notes (Signed)
Patient ID: [name], female   DOB: 1940/12/04, 73 y.o.   MRN: [id number]  [facility] Current Height: Height: 5\' 3"  (160 cm)      Max Lifetime Height:  5\' 4"  Current Weight: Weight: 162 lb (73.483 kg)       Ethnicity:Caucasian  BP:       HR:         HPI: Does pt already have a diagnosis of:  Osteopenia?  Yes Osteoporosis?  No  Back Pain?  No       Kyphosis?  No Prior fracture?  Yes - humerus 15 years ago due to fall Med(s) for Osteoporosis/Osteopenia:  None - discussed in past but patient refused Med(s) previously tried for Osteoporosis/Osteopenia:  none                                                             PMH: Age at menopause:  23's Hysterectomy?  No Oophorectomy?  No HRT? Yes - Current.  Type/duration: previsoulsy took premarin/provera for 10 yrs.  Now uses estridiol vaginal cream 2 times weekly Steroid Use?  No Thyroid med?  No History of cancer?  Yes - skin cancer History of digestive disorders (ie Crohn's)?  Yes - hiatal hernia.  Has Rx for PPI but only has to take occasionally  Current or previous eating disorders?  No Last Vitamin D Result:  34.4 (08/15/2013) Last GFR Result:  60 (08/15/2013)   FH/SH: Family history of osteoporosis?  No Parent with history of hip fracture?  No Family history of breast cancer?  No Exercise?  Yes - some dancing 3 times per weekly Smoking?  No Alcohol?  No    Calcium Assessment Calcium Intake  # of servings/day  Calcium mg  Milk (8 oz) 0.5  x  300  = 150mg   Yogurt (4 oz) 0.5 x  200 = 100mg   Cheese (1 oz) 0 x  200 = 0  Other Calcium sources   250mg   Ca supplement Ca supplement qd and MVI = 1000mg    Estimated calcium intake per day 1600mg     DEXA Results Date of Test T-Score for AP Spine L1-L4 T-Score for Total Left Hip T-Score for Total Right Hip  11/30/2013 -0.3 -0.8 -1.3  01/29/2011 -0.5 -1.1 -1.6  10/13/2005 -0.2 -0.7 -1.1        FRAX 10 year estimate: Total FX risk:  19%  (consider medication if  >/= 20%) Hip FX risk:  4.1%  (consider medication if >/= 3%)  Assessment: Osteopenia with high fracture risk assessment  Recommendations: 1.  Discussed results of BMD which has increased slightly.  Patient continues to decline treatment for osteopenia / fracture prevention 2.  continue calcium 1200mg  daily through supplementation or diet.  3.  recommend weight bearing exercise - 30 minutes at least 4 days per week.   4.  Counseled and educated about fall risk and prevention.  Recheck DEXA:  2 years  Time spent counseling patient:  20 minutes  Cherre Robins, PharmD, CPP

## 2013-12-26 ENCOUNTER — Other Ambulatory Visit: Payer: Self-pay | Admitting: Family Medicine

## 2014-02-14 ENCOUNTER — Ambulatory Visit (INDEPENDENT_AMBULATORY_CARE_PROVIDER_SITE_OTHER): Payer: Medicare Other | Admitting: Family Medicine

## 2014-02-14 ENCOUNTER — Encounter: Payer: Self-pay | Admitting: Family Medicine

## 2014-02-14 VITALS — BP 141/64 | HR 67 | Temp 97.7°F | Ht 62.0 in | Wt 148.6 lb

## 2014-02-14 DIAGNOSIS — M899 Disorder of bone, unspecified: Secondary | ICD-10-CM

## 2014-02-14 DIAGNOSIS — N3281 Overactive bladder: Secondary | ICD-10-CM

## 2014-02-14 DIAGNOSIS — E785 Hyperlipidemia, unspecified: Secondary | ICD-10-CM

## 2014-02-14 DIAGNOSIS — N318 Other neuromuscular dysfunction of bladder: Secondary | ICD-10-CM

## 2014-02-14 DIAGNOSIS — R5381 Other malaise: Secondary | ICD-10-CM | POA: Diagnosis not present

## 2014-02-14 DIAGNOSIS — M8580 Other specified disorders of bone density and structure, unspecified site: Secondary | ICD-10-CM

## 2014-02-14 DIAGNOSIS — R5383 Other fatigue: Secondary | ICD-10-CM

## 2014-02-14 DIAGNOSIS — I10 Essential (primary) hypertension: Secondary | ICD-10-CM

## 2014-02-14 DIAGNOSIS — E559 Vitamin D deficiency, unspecified: Secondary | ICD-10-CM | POA: Diagnosis not present

## 2014-02-14 DIAGNOSIS — G20 Parkinson's disease: Secondary | ICD-10-CM

## 2014-02-14 DIAGNOSIS — M949 Disorder of cartilage, unspecified: Secondary | ICD-10-CM

## 2014-02-14 LAB — POCT CBC
GRANULOCYTE PERCENT: 73 % (ref 37–80)
HEMATOCRIT: 42.8 % (ref 37.7–47.9)
Hemoglobin: 14.1 g/dL (ref 12.2–16.2)
Lymph, poc: 1.2 (ref 0.6–3.4)
MCH, POC: 30.1 pg (ref 27–31.2)
MCHC: 33 g/dL (ref 31.8–35.4)
MCV: 91.3 fL (ref 80–97)
MPV: 8.5 fL (ref 0–99.8)
POC Granulocyte: 3.7 (ref 2–6.9)
POC LYMPH %: 23.6 % (ref 10–50)
Platelet Count, POC: 220 10*3/uL (ref 142–424)
RBC: 4.7 M/uL (ref 4.04–5.48)
RDW, POC: 13.5 %
WBC: 5 10*3/uL (ref 4.6–10.2)

## 2014-02-14 MED ORDER — LISINOPRIL 10 MG PO TABS: ORAL_TABLET | ORAL | Status: DC

## 2014-02-14 MED ORDER — OXYBUTYNIN CHLORIDE 5 MG PO TABS: 5.0000 mg | ORAL_TABLET | Freq: Two times a day (BID) | ORAL | Status: DC

## 2014-02-14 NOTE — Patient Instructions (Addendum)
Continue current medications. Continue good therapeutic lifestyle changes which include good diet and exercise. Fall precautions discussed with patient. If an FOBT was given today- please return it to our front desk. If you are over 72 years old - you may need Prevnar 104 or the adult Pneumonia vaccine.  Continue to keep appointment with the neurologist Get your eye exam in August Continue your exercise program This summer, drink plenty of fluids Always be careful and did not put yourself at risk for any falling. Consider in the future moving her washer/dryer up stairs. Monitor blood pressures at home and return them at your next visit.

## 2014-02-14 NOTE — Progress Notes (Signed)
Subjective:    Patient ID: Tracey Juarez, female    DOB: 23-Oct-1940, 73 y.o.   MRN: [id number]  HPI Pt is here today for follow up of chronic medical problems to include hypertension, hyperlipidemia and GERD. This patient is followed by the neurologist for her Parkinson's disease. She currently sees him yearly and is only taking Benadryl for this at this time. She exercises regularly and also is involved in dance. She is careful not to fall. She does have a washer/dryer in the basement and has cats in the basement. She has a railing on the steps so she can hold onto this when she's going up and down steps. Her review of systems is negative for pulmonary cardiovascular GI or urinary tract issues. She seems to be managing her Parkinson's well at this point in time. The patient indicates that her home blood pressures are running in the 120s over the 70s always. She will monitor these readings and were turned them at her next visit.     Patient Active Problem List   Diagnosis Date Noted  . Osteopenia 08/15/2013  . Parkinson disease   . Hypertension   . Atrophic vaginitis   . Detrusor instability   . Melanoma   . Mixed basal-squamous cell carcinoma    Outpatient Encounter Prescriptions as of 02/14/2014  Medication Sig  . Calcium Carbonate-Vit D-Min (CALTRATE PLUS PO) Take by mouth daily.   . Cholecalciferol (VITAMIN D) 2000 UNITS CAPS Take 1 capsule by mouth daily.  . clopidogrel (PLAVIX) 75 MG tablet Take 75 mg by mouth daily.  . diphenhydrAMINE (BENADRYL) 25 mg capsule Take 25 mg by mouth every 6 (six) hours as needed (for parkinson's symptoms).   Marland Kitchen lisinopril (PRINIVIL,ZESTRIL) 10 MG tablet TAKE ONE TABLET BY MOUTH ONCE DAILY  . Multiple Vitamin (MULTIVITAMIN) capsule Take 1 capsule by mouth daily.    . NONFORMULARY OR COMPOUNDED ITEM Estradiol 0.02% vaginal cream place vaginally twice weekly  . oxybutynin (DITROPAN) 5 MG tablet Take 1 tablet (5 mg total) by mouth 2 (two) times daily.    . pantoprazole (PROTONIX) 40 MG tablet Take 1 tablet (40 mg total) by mouth daily.  . simvastatin (ZOCOR) 40 MG tablet Take 1 tablet (40 mg total) by mouth at bedtime.        Review of Systems  Constitutional: Negative.   HENT: Negative.   Eyes: Negative.   Cardiovascular: Negative for chest pain and leg swelling.  Genitourinary: Negative for dysuria and frequency.  Neurological: Negative.   Psychiatric/Behavioral: Negative.        Objective:   Physical Exam  Nursing note and vitals reviewed. Constitutional: She is oriented to person, place, and time. She appears well-developed and well-nourished. No distress.  HENT:  Head: Normocephalic and atraumatic.  Right Ear: External ear normal.  Left Ear: External ear normal.  Nose: Nose normal.  Mouth/Throat: Oropharynx is clear and moist. No oropharyngeal exudate.  Eyes: Conjunctivae and EOM are normal. Pupils are equal, round, and reactive to light. Right eye exhibits no discharge. Left eye exhibits no discharge. No scleral icterus.  Neck: Normal range of motion. Neck supple. No thyromegaly present.  There are no carotid bruits.  Cardiovascular: Normal rate, regular rhythm, normal heart sounds and intact distal pulses.  Exam reveals no gallop and no friction rub.   No murmur heard. At 72 per minute  Pulmonary/Chest: Effort normal and breath sounds normal. No respiratory distress. She has no wheezes. She has no rales. She exhibits no tenderness.  There are no axillary nodes.  Abdominal: Soft. Bowel sounds are normal. She exhibits no mass. There is no tenderness. There is no rebound and no guarding.  There are no inguinal nodes  Musculoskeletal: Normal range of motion. She exhibits no edema and no tenderness.  Lymphadenopathy:    She has no cervical adenopathy.  Neurological: She is alert and oriented to person, place, and time. She has normal reflexes. No cranial nerve deficit.  Her gait was good and not shuffling. She does have  a slight resting tremor in the right hand.  Skin: Skin is warm and dry. No rash noted.  Psychiatric: She has a normal mood and affect. Her behavior is normal. Judgment and thought content normal.   BP 141/64  Pulse 67  Temp(Src) 97.7 F (36.5 C) (Oral)  Ht _0  (1.575 m)  Wt 148 lb 9.6 oz (67.405 kg)  BMI 27.17 kg/m2        Assessment & Plan:  1. Other fatigue - POCT CBC - Thyroid Panel With TSH  2. Essential hypertension  3. Hyperlipemia - BMP8+EGFR - NMR, lipoprofile - Hepatic function panel  4. Vitamin D deficiency - Vit D  25 hydroxy (rtn osteoporosis monitoring)  5. Parkinson disease  6. Osteopenia Meds ordered this encounter  Medications  . Cholecalciferol (VITAMIN D) 2000 UNITS CAPS    Sig: Take 1 capsule by mouth daily.   Patient Instructions  Continue current medications. Continue good therapeutic lifestyle changes which include good diet and exercise. Fall precautions discussed with patient. If an FOBT was given today- please return it to our front desk. If you are over 28 years old - you may need Prevnar 87 or the adult Pneumonia vaccine.  Continue to keep appointment with the neurologist Get your eye exam in August Continue your exercise program This summer, drink plenty of fluids Always be careful and did not put yourself at risk for any falling. Consider in the future moving her washer/dryer up stairs. Monitor blood pressures at home and return them at your next visit.   Arrie Senate MD

## 2014-02-15 LAB — HEPATIC FUNCTION PANEL
ALBUMIN: 4.6 g/dL (ref 3.5–4.8)
ALT: 13 IU/L (ref 0–32)
AST: 22 IU/L (ref 0–40)
Alkaline Phosphatase: 66 IU/L (ref 39–117)
BILIRUBIN TOTAL: 0.5 mg/dL (ref 0.0–1.2)
Bilirubin, Direct: 0.14 mg/dL (ref 0.00–0.40)
Total Protein: 6.7 g/dL (ref 6.0–8.5)

## 2014-02-15 LAB — NMR, LIPOPROFILE
CHOLESTEROL: 162 mg/dL (ref 100–199)
HDL Cholesterol by NMR: 59 mg/dL (ref >39)
HDL Particle Number: 38.1 umol/L (ref >=30.5)
LDL PARTICLE NUMBER: 1305 nmol/L — AB (ref <1000)
LDL SIZE: 20.7 nm (ref >20.5)
LDLC SERPL CALC-MCNC: 84 mg/dL (ref 0–99)
LP-IR SCORE: 37 (ref <=45)
Small LDL Particle Number: 546 nmol/L — ABNORMAL HIGH (ref <=527)
TRIGLYCERIDES BY NMR: 97 mg/dL (ref 0–149)

## 2014-02-15 LAB — BMP8+EGFR
BUN/Creatinine Ratio: 10 — ABNORMAL LOW (ref 11–26)
BUN: 9 mg/dL (ref 8–27)
CHLORIDE: 103 mmol/L (ref 97–108)
CO2: 26 mmol/L (ref 18–29)
Calcium: 10 mg/dL (ref 8.7–10.3)
Creatinine, Ser: 0.93 mg/dL (ref 0.57–1.00)
GFR calc Af Amer: 72 mL/min/{1.73_m2} (ref >59)
GFR, EST NON AFRICAN AMERICAN: 62 mL/min/{1.73_m2} (ref >59)
Glucose: 91 mg/dL (ref 65–99)
Potassium: 5.1 mmol/L (ref 3.5–5.2)
Sodium: 143 mmol/L (ref 134–144)

## 2014-02-15 LAB — THYROID PANEL WITH TSH
FREE THYROXINE INDEX: 2.3 (ref 1.2–4.9)
T3 UPTAKE RATIO: 26 % (ref 24–39)
T4 TOTAL: 9 ug/dL (ref 4.5–12.0)
TSH: 0.945 u[IU]/mL (ref 0.450–4.500)

## 2014-02-15 LAB — VITAMIN D 25 HYDROXY (VIT D DEFICIENCY, FRACTURES): Vit D, 25-Hydroxy: 42.7 ng/mL (ref 30.0–100.0)

## 2014-03-17 ENCOUNTER — Encounter: Payer: Self-pay | Admitting: Family Medicine

## 2014-03-28 ENCOUNTER — Telehealth: Payer: Self-pay | Admitting: Family Medicine

## 2014-03-29 ENCOUNTER — Telehealth: Payer: Self-pay | Admitting: Family Medicine

## 2014-03-29 ENCOUNTER — Encounter: Payer: Self-pay | Admitting: Nurse Practitioner

## 2014-03-29 NOTE — Telephone Encounter (Signed)
Patient aware she said she would check and if she couldn't she would call back and let us know

## 2014-03-29 NOTE — Telephone Encounter (Signed)
Letter ready to be ailed to patient

## 2014-03-29 NOTE — Telephone Encounter (Signed)
Patient said the letter is wrong it needs to say that she is to way no [address] than [address] and she wants the letter mailed to her

## 2014-03-29 NOTE — Telephone Encounter (Signed)
Letter sent to my chart- if can't access it let me know.

## 2014-03-29 NOTE — Telephone Encounter (Signed)
Letter mailed

## 2014-04-12 DIAGNOSIS — D485 Neoplasm of uncertain behavior of skin: Secondary | ICD-10-CM | POA: Diagnosis not present

## 2014-04-12 DIAGNOSIS — L851 Acquired keratosis [keratoderma] palmaris et plantaris: Secondary | ICD-10-CM | POA: Diagnosis not present

## 2014-04-12 DIAGNOSIS — Z85828 Personal history of other malignant neoplasm of skin: Secondary | ICD-10-CM | POA: Diagnosis not present

## 2014-04-12 DIAGNOSIS — L570 Actinic keratosis: Secondary | ICD-10-CM | POA: Diagnosis not present

## 2014-05-08 DIAGNOSIS — Z23 Encounter for immunization: Secondary | ICD-10-CM | POA: Diagnosis not present

## 2014-05-18 ENCOUNTER — Other Ambulatory Visit: Payer: Self-pay

## 2014-05-18 DIAGNOSIS — Z1231 Encounter for screening mammogram for malignant neoplasm of breast: Secondary | ICD-10-CM

## 2014-05-18 DIAGNOSIS — Z1239 Encounter for other screening for malignant neoplasm of breast: Secondary | ICD-10-CM

## 2014-05-30 DIAGNOSIS — G20 Parkinson's disease: Secondary | ICD-10-CM | POA: Diagnosis not present

## 2014-05-30 DIAGNOSIS — I658 Occlusion and stenosis of other precerebral arteries: Secondary | ICD-10-CM | POA: Diagnosis not present

## 2014-05-30 DIAGNOSIS — R251 Tremor, unspecified: Secondary | ICD-10-CM | POA: Diagnosis not present

## 2014-06-12 DIAGNOSIS — H40053 Ocular hypertension, bilateral: Secondary | ICD-10-CM | POA: Diagnosis not present

## 2014-06-12 DIAGNOSIS — H5203 Hypermetropia, bilateral: Secondary | ICD-10-CM | POA: Diagnosis not present

## 2014-07-27 ENCOUNTER — Ambulatory Visit (INDEPENDENT_AMBULATORY_CARE_PROVIDER_SITE_OTHER): Payer: Medicare Other | Admitting: Gynecology

## 2014-07-27 ENCOUNTER — Encounter: Payer: Self-pay | Admitting: Gynecology

## 2014-07-27 ENCOUNTER — Ambulatory Visit
Admission: RE | Admit: 2014-07-27 | Discharge: 2014-07-27 | Disposition: A | Discharge status: Home / Self Care | Payer: Medicare Other | Source: Ambulatory Visit

## 2014-07-27 VITALS — BP 124/80 | Ht 63.0 in | Wt 143.0 lb

## 2014-07-27 DIAGNOSIS — Z1231 Encounter for screening mammogram for malignant neoplasm of breast: Secondary | ICD-10-CM | POA: Diagnosis not present

## 2014-07-27 DIAGNOSIS — N952 Postmenopausal atrophic vaginitis: Secondary | ICD-10-CM

## 2014-07-27 DIAGNOSIS — M8580 Other specified disorders of bone density and structure, unspecified site: Secondary | ICD-10-CM | POA: Diagnosis not present

## 2014-07-27 MED ORDER — NONFORMULARY OR COMPOUNDED ITEM: Status: DC

## 2014-07-27 NOTE — Patient Instructions (Signed)
You may obtain a copy of any labs that were done today by logging onto MyChart as outlined in the instructions provided with your AVS (after visit summary). The office will not call with normal lab results but certainly if there are any significant abnormalities then we will contact you.   Health Maintenance, Female A healthy lifestyle and preventative care can promote health and wellness.  Maintain regular health, dental, and eye exams.  Eat a healthy diet. Foods like vegetables, fruits, whole grains, low-fat dairy products, and lean protein foods contain the nutrients you need without too many calories. Decrease your intake of foods high in solid fats, added sugars, and salt. Get information about a proper diet from your caregiver, if necessary.  Regular physical exercise is one of the most important things you can do for your health. Most adults should get at least 150 minutes of moderate-intensity exercise (any activity that increases your heart rate and causes you to sweat) each week. In addition, most adults need muscle-strengthening exercises on 2 or more days a week.   Maintain a healthy weight. The body mass index (BMI) is a screening tool to identify possible weight problems. It provides an estimate of body fat based on height and weight. Your caregiver can help determine your BMI, and can help you achieve or maintain a healthy weight. For adults 20 years and older:  A BMI below 18.5 is considered underweight.  A BMI of 18.5 to 24.9 is normal.  A BMI of 25 to 29.9 is considered overweight.  A BMI of 30 and above is considered obese.  Maintain normal blood lipids and cholesterol by exercising and minimizing your intake of saturated fat. Eat a balanced diet with plenty of fruits and vegetables. Blood tests for lipids and cholesterol should begin at age 61 and be repeated every 5 years. If your lipid or cholesterol levels are high, you are over 50, or you are a high risk for heart  disease, you may need your cholesterol levels checked more frequently.Ongoing high lipid and cholesterol levels should be treated with medicines if diet and exercise are not effective.  If you smoke, find out from your caregiver how to quit. If you do not use tobacco, do not start.  Lung cancer screening is recommended for adults aged 33 80 years who are at high risk for developing lung cancer because of a history of smoking. Yearly low-dose computed tomography (CT) is recommended for people who have at least a 30-pack-year history of smoking and are a current smoker or have quit within the past 15 years. A pack year of smoking is smoking an average of 1 pack of cigarettes a day for 1 year (for example: 1 pack a day for 30 years or 2 packs a day for 15 years). Yearly screening should continue until the smoker has stopped smoking for at least 15 years. Yearly screening should also be stopped for people who develop a health problem that would prevent them from having lung cancer treatment.  If you are pregnant, do not drink alcohol. If you are breastfeeding, be very cautious about drinking alcohol. If you are not pregnant and choose to drink alcohol, do not exceed 1 drink per day. One drink is considered to be 12 ounces (355 mL) of beer, 5 ounces (148 mL) of wine, or 1.5 ounces (44 mL) of liquor.  Avoid use of street drugs. Do not share needles with anyone. Ask for help if you need support or instructions about stopping  the use of drugs.  High blood pressure causes heart disease and increases the risk of stroke. Blood pressure should be checked at least every 1 to 2 years. Ongoing high blood pressure should be treated with medicines, if weight loss and exercise are not effective.  If you are 59 to 72 years old, ask your caregiver if you should take aspirin to prevent strokes.  Diabetes screening involves taking a blood sample to check your fasting blood sugar level. This should be done once every 3  years, after age 91, if you are within normal weight and without risk factors for diabetes. Testing should be considered at a younger age or be carried out more frequently if you are overweight and have at least 1 risk factor for diabetes.  Breast cancer screening is essential preventative care for women. You should practice "breast self-awareness." This means understanding the normal appearance and feel of your breasts and may include breast self-examination. Any changes detected, no matter how small, should be reported to a caregiver. Women in their 66s and 30s should have a clinical breast exam (CBE) by a caregiver as part of a regular health exam every 1 to 3 years. After age 101, women should have a CBE every year. Starting at age 100, women should consider having a mammogram (breast X-ray) every year. Women who have a family history of breast cancer should talk to their caregiver about genetic screening. Women at a high risk of breast cancer should talk to their caregiver about having an MRI and a mammogram every year.  Breast cancer gene (BRCA)-related cancer risk assessment is recommended for women who have family members with BRCA-related cancers. BRCA-related cancers include breast, ovarian, tubal, and peritoneal cancers. Having family members with these cancers may be associated with an increased risk for harmful changes (mutations) in the breast cancer genes BRCA1 and BRCA2. Results of the assessment will determine the need for genetic counseling and BRCA1 and BRCA2 testing.  The Pap test is a screening test for cervical cancer. Women should have a Pap test starting at age 57. Between ages 25 and 35, Pap tests should be repeated every 2 years. Beginning at age 37, you should have a Pap test every 3 years as long as the past 3 Pap tests have been normal. If you had a hysterectomy for a problem that was not cancer or a condition that could lead to cancer, then you no longer need Pap tests. If you are  between ages 50 and 76, and you have had normal Pap tests going back 10 years, you no longer need Pap tests. If you have had past treatment for cervical cancer or a condition that could lead to cancer, you need Pap tests and screening for cancer for at least 20 years after your treatment. If Pap tests have been discontinued, risk factors (such as a new sexual partner) need to be reassessed to determine if screening should be resumed. Some women have medical problems that increase the chance of getting cervical cancer. In these cases, your caregiver may recommend more frequent screening and Pap tests.  The human papillomavirus (HPV) test is an additional test that may be used for cervical cancer screening. The HPV test looks for the virus that can cause the cell changes on the cervix. The cells collected during the Pap test can be tested for HPV. The HPV test could be used to screen women aged 44 years and older, and should be used in women of any age  who have unclear Pap test results. After the age of 55, women should have HPV testing at the same frequency as a Pap test.  Colorectal cancer can be detected and often prevented. Most routine colorectal cancer screening begins at the age of 44 and continues through age 20. However, your caregiver may recommend screening at an earlier age if you have risk factors for colon cancer. On a yearly basis, your caregiver may provide home test kits to check for hidden blood in the stool. Use of a small camera at the end of a tube, to directly examine the colon (sigmoidoscopy or colonoscopy), can detect the earliest forms of colorectal cancer. Talk to your caregiver about this at age 86, when routine screening begins. Direct examination of the colon should be repeated every 5 to 10 years through age 13, unless early forms of pre-cancerous polyps or small growths are found.  Hepatitis C blood testing is recommended for all people born from [birth year] through 1964 and any  individual with known risks for hepatitis C.  Practice safe sex. Use condoms and avoid high-risk sexual practices to reduce the spread of sexually transmitted infections (STIs). Sexually active women aged 36 and younger should be checked for Chlamydia, which is a common sexually transmitted infection. Older women with new or multiple partners should also be tested for Chlamydia. Testing for other STIs is recommended if you are sexually active and at increased risk.  Osteoporosis is a disease in which the bones lose minerals and strength with aging. This can result in serious bone fractures. The risk of osteoporosis can be identified using a bone density scan. Women ages 20 and over and women at risk for fractures or osteoporosis should discuss screening with their caregivers. Ask your caregiver whether you should be taking a calcium supplement or vitamin D to reduce the rate of osteoporosis.  Menopause can be associated with physical symptoms and risks. Hormone replacement therapy is available to decrease symptoms and risks. You should talk to your caregiver about whether hormone replacement therapy is right for you.  Use sunscreen. Apply sunscreen liberally and repeatedly throughout the day. You should seek shade when your shadow is shorter than you. Protect yourself by wearing long sleeves, pants, a wide-brimmed hat, and sunglasses year round, whenever you are outdoors.  Notify your caregiver of new moles or changes in moles, especially if there is a change in shape or color. Also notify your caregiver if a mole is larger than the size of a pencil eraser.  Stay current with your immunizations. Document Released: 02/10/2011 Document Revised: 11/22/2012 Document Reviewed: 02/10/2011 [facility] Patient Information 2014 [employer].

## 2014-07-27 NOTE — Progress Notes (Signed)
Tracey Juarez 03-17-1941 [id number]        73 y.o.  G0P0 for follow up exam. Several issues noted below.  Past medical history,surgical history, problem list, medications, allergies, family history and social history were all reviewed and documented as reviewed in the EPIC chart.  ROS:  Performed with pertinent positives and negatives included in the history, assessment and plan.   Additional significant findings :  none   Exam: Tracey Juarez Vitals:   07/27/14 1358  BP: 124/80  Height: 5\' 3"  (1.6 m)  Weight: 143 lb (64.864 kg)   General appearance:  Normal affect, orientation and appearance. Skin: Grossly normal HEENT: Without gross lesions.  No cervical or supraclavicular adenopathy. Thyroid normal.  Lungs:  Clear without wheezing, rales or rhonchi Cardiac: RR, without RMG Abdominal:  Soft, nontender, without masses, guarding, rebound, organomegaly or hernia Breasts:  Examined lying and sitting without masses, retractions, discharge or axillary adenopathy. Pelvic:  Ext/BUS/vagina with significant atrophic changes  Cervix atrophic  Uterus axial to anteverted, normal size, shape and contour, midline and mobile nontender   Adnexa  Without masses or tenderness    Anus and perineum  Normal   Rectovaginal  Normal sphincter tone without palpated masses or tenderness.    Assessment/Plan:  73 y.o. G0P0 female for annual exam.   1. Postmenopausal/atrophic genital changes. Was started on estradiol vaginal cream twice weekly last year with great results. Patient notes it is helped a lot with vaginal dryness. She wants to continue.  We've discussed the risks as noted in last year's note. Patient understands and accepts and I refilled her 1 year. No vaginal bleeding. Patient knows to report any vaginal bleeding. 2. Osteopenia. DEXA 2015 T score -1.9. Stable from prior DEXA. We'll continue to follow up with her primary physician in reference to this. Increased calcium vitamin D  reviewed. 3. Mammography 07/2014. Continue with annual mammography. SBE monthly reviewed. 4. Pap smear 2012. No Pap smear done today. No history of significant abnormal Pap smears. Per current screening guidelines that she is over the age of 62 we both agree with stop screening and she is comfortable with this. 5. Colonoscopy a proximally 5 years ago. Repeat at their recommended interval. 6. Health maintenance. No routine blood work done as this is done at her primary physician's office. Patient reports having received Pneumovax, Zostavax and influenza vaccine through her primary physician.  Follow up in one year, sooner if any issues.     Anastasio Auerbach MD, 2:18 PM 07/27/2014

## 2014-07-28 ENCOUNTER — Telehealth: Payer: Self-pay

## 2014-07-28 ENCOUNTER — Other Ambulatory Visit: Payer: Self-pay | Admitting: Family Medicine

## 2014-07-28 DIAGNOSIS — R928 Other abnormal and inconclusive findings on diagnostic imaging of breast: Secondary | ICD-10-CM

## 2014-07-28 DIAGNOSIS — N6489 Other specified disorders of breast: Secondary | ICD-10-CM

## 2014-07-28 LAB — URINALYSIS W MICROSCOPIC + REFLEX CULTURE
Bacteria, UA: NONE SEEN
Bilirubin Urine: NEGATIVE
CRYSTALS: NONE SEEN
Casts: NONE SEEN
Glucose, UA: NEGATIVE mg/dL
Hgb urine dipstick: NEGATIVE
Ketones, ur: NEGATIVE mg/dL
LEUKOCYTES UA: NEGATIVE
NITRITE: NEGATIVE
PROTEIN: NEGATIVE mg/dL
SQUAMOUS EPITHELIAL / LPF: NONE SEEN
Specific Gravity, Urine: 1.01 (ref 1.005–1.030)
UROBILINOGEN UA: 0.2 mg/dL (ref 0.0–1.0)
pH: 7.5 (ref 5.0–8.0)

## 2014-07-28 NOTE — Telephone Encounter (Signed)
Pt aware of results and is scheduled for 08/10/2014 at [place]

## 2014-07-28 NOTE — Telephone Encounter (Signed)
-----   Message from Chipper Herb, MD sent at 07/28/2014  7:45 AM EST ----- Very important [facility] schedule this patient for additional imaging as requested by the radiologist

## 2014-08-10 ENCOUNTER — Other Ambulatory Visit: Payer: Medicare Other

## 2014-08-15 ENCOUNTER — Ambulatory Visit
Admission: RE | Admit: 2014-08-15 | Discharge: 2014-08-15 | Disposition: A | Discharge status: Home / Self Care | Payer: Medicare Other | Source: Ambulatory Visit | Attending: Family Medicine | Admitting: Family Medicine

## 2014-08-15 DIAGNOSIS — R928 Other abnormal and inconclusive findings on diagnostic imaging of breast: Secondary | ICD-10-CM

## 2014-08-15 DIAGNOSIS — N6489 Other specified disorders of breast: Secondary | ICD-10-CM | POA: Diagnosis not present

## 2014-08-17 ENCOUNTER — Encounter: Payer: Self-pay | Admitting: Family Medicine

## 2014-08-17 ENCOUNTER — Ambulatory Visit (INDEPENDENT_AMBULATORY_CARE_PROVIDER_SITE_OTHER): Payer: Medicare Other | Admitting: Family Medicine

## 2014-08-17 VITALS — BP 160/70 | HR 70 | Temp 97.6°F | Ht 63.0 in | Wt 145.0 lb

## 2014-08-17 DIAGNOSIS — I10 Essential (primary) hypertension: Secondary | ICD-10-CM

## 2014-08-17 DIAGNOSIS — E559 Vitamin D deficiency, unspecified: Secondary | ICD-10-CM | POA: Diagnosis not present

## 2014-08-17 DIAGNOSIS — G20 Parkinson's disease: Secondary | ICD-10-CM | POA: Diagnosis not present

## 2014-08-17 DIAGNOSIS — E785 Hyperlipidemia, unspecified: Secondary | ICD-10-CM

## 2014-08-17 DIAGNOSIS — G20A1 Parkinson's disease without dyskinesia, without mention of fluctuations: Secondary | ICD-10-CM

## 2014-08-17 LAB — POCT CBC
Granulocyte percent: 58.4 %G (ref 37–80)
HCT, POC: 44.5 % (ref 37.7–47.9)
Hemoglobin: 14 g/dL (ref 12.2–16.2)
LYMPH, POC: 1.8 (ref 0.6–3.4)
MCH: 29.2 pg (ref 27–31.2)
MCHC: 31.5 g/dL — AB (ref 31.8–35.4)
MCV: 92.7 fL (ref 80–97)
MPV: 7.3 fL (ref 0–99.8)
PLATELET COUNT, POC: 248 10*3/uL (ref 142–424)
POC Granulocyte: 3.1 (ref 2–6.9)
POC LYMPH PERCENT: 58.4 %L — AB (ref 10–50)
RBC: 4.8 M/uL (ref 4.04–5.48)
RDW, POC: 13.7 %
WBC: 5.3 10*3/uL (ref 4.6–10.2)

## 2014-08-17 NOTE — Progress Notes (Signed)
Subjective:    Patient ID: Tracey Juarez, female    DOB: May 15, 1941, 74 y.o.   MRN: [id number]  HPI Pt here for follow up and management of chronic medical problems which include hypertension, hyperlipidemia, and Parkinson's disease. It is important to note that the patient has had a recent abnormal mammogram and is requiring follow-up imaging in about 6 months on the left breast. Her gynecologist is managing this. The patient brings her home blood pressures in for review today and they are all running in the 114 range up to the 120s over the 60s and 70s. The patient is due to return an FOBT and get lab work today. She has no specific complaints.         Patient Active Problem List   Diagnosis Date Noted  . Osteopenia 08/15/2013  . Parkinson disease   . Hypertension   . Atrophic vaginitis   . Detrusor instability   . Melanoma   . Mixed basal-squamous cell carcinoma    Outpatient Encounter Prescriptions as of 08/17/2014  Medication Sig  . Calcium Carbonate-Vit D-Min (CALTRATE PLUS PO) Take by mouth daily.   . Cholecalciferol (VITAMIN D) 2000 UNITS CAPS Take 1 capsule by mouth daily.  . clopidogrel (PLAVIX) 75 MG tablet Take 75 mg by mouth daily.  . diphenhydrAMINE (BENADRYL) 25 mg capsule Take 25 mg by mouth every 6 (six) hours as needed (for parkinson's symptoms).   Marland Kitchen lisinopril (PRINIVIL,ZESTRIL) 10 MG tablet TAKE ONE TABLET BY MOUTH ONCE DAILY  . Magnesium 250 MG TABS Take 1 tablet by mouth daily.  . Multiple Vitamin (MULTIVITAMIN) capsule Take 1 capsule by mouth daily.    . NONFORMULARY OR COMPOUNDED ITEM Estradiol 0.02% vaginal cream place vaginally twice weekly  . oxybutynin (DITROPAN) 5 MG tablet Take 1 tablet (5 mg total) by mouth 2 (two) times daily.  . simvastatin (ZOCOR) 40 MG tablet Take 1 tablet (40 mg total) by mouth at bedtime.  . [DISCONTINUED] pantoprazole (PROTONIX) 40 MG tablet Take 1 tablet (40 mg total) by mouth daily.     Review of Systems    Constitutional: Negative.   HENT: Negative.   Eyes: Negative.   Respiratory: Negative.   Cardiovascular: Negative.   Gastrointestinal: Negative.   Endocrine: Negative.   Genitourinary: Negative.   Musculoskeletal: Negative.   Skin: Negative.   Allergic/Immunologic: Negative.   Neurological: Negative.   Hematological: Negative.   Psychiatric/Behavioral: Negative.        Objective:   Physical Exam  Constitutional: She is oriented to person, place, and time. She appears well-developed and well-nourished.  The patient is alert and relaxed. She is soft spoken. The tremor on the initial visit was not noticeable but became more noticeable in the right hand with the exam.  HENT:  Head: Normocephalic and atraumatic.  Right Ear: External ear normal.  Left Ear: External ear normal.  Nose: Nose normal.  Mouth/Throat: Oropharynx is clear and moist.  Eyes: Conjunctivae and EOM are normal. Pupils are equal, round, and reactive to light. Right eye exhibits no discharge. Left eye exhibits no discharge. No scleral icterus.  Neck: Normal range of motion. Neck supple. No JVD present. No thyromegaly present.  There were no carotid bruits or anterior cervical adenopathy  Cardiovascular: Normal rate, regular rhythm, normal heart sounds and intact distal pulses.  Exam reveals no gallop and no friction rub.   No murmur heard. The heart had a regular rate and rhythm at 60/m  Pulmonary/Chest: Effort normal and breath sounds  normal. No respiratory distress. She has no wheezes. She has no rales. She exhibits no tenderness.  There was no axillary adenopathy  Abdominal: Soft. Bowel sounds are normal. She exhibits no mass. There is no tenderness. There is no rebound and no guarding.  The abdomen was soft without masses tenderness or abdominal bruits  Musculoskeletal: Normal range of motion. She exhibits no edema or tenderness.  Range of motion appeared normal. Gait appeared normal  Lymphadenopathy:    She  has no cervical adenopathy.  Neurological: She is alert and oriented to person, place, and time. She has normal reflexes. No cranial nerve deficit.  With sitting and during the examination the tremor was very prominent and the first 2 fingers of the right hand and the right hand in general.  Skin: Skin is warm and dry. No rash noted.  Psychiatric: She has a normal mood and affect. Her behavior is normal. Judgment and thought content normal.  Agent was alert, cooperative.  Nursing note and vitals reviewed.  BP 160/70 mmHg  Pulse 70  Temp(Src) 97.6 F (36.4 C) (Oral)  Ht _0  (1.6 m)  Wt 145 lb (65.772 kg)  BMI 25.69 kg/m2        Assessment & Plan:  1. Hyperlipemia -Continue aggressive therapeutic lifestyle changes and current medication - POCT CBC - NMR, lipoprofile  2. Essential hypertension -Continue current medication and continue to monitor blood pressures at home and bring these readings in for review review to each visit -Watch sodium intake - POCT CBC - BMP8+EGFR - Hepatic function panel  3. Vitamin D deficiency -Continue current treatment and any changes will be made after lab work done today is reviewed - POCT CBC - Vit D  25 hydroxy (rtn osteoporosis monitoring)  4. Parkinson disease -Continue follow-up with neurologist, Dr. Hyman Bower in [place].- -Exercise more regularly especially with walking regularly--exercising several times daily is helpful for Parkinson's disease. - POCT CBC  Patient Instructions                       Medicare Annual Wellness Visit  [facility] and the medical providers at [facility] strive to bring you the best medical care.  In doing so we not only want to address your current medical conditions and concerns but also to detect new conditions early and prevent illness, disease and health-related problems.    Medicare offers a yearly Wellness Visit which allows our clinical staff to assess your need for  preventative services including immunizations, lifestyle education, counseling to decrease risk of preventable diseases and screening for fall risk and other medical concerns.    This visit is provided free of charge (no copay) for all Medicare recipients. The clinical pharmacists at [facility] have begun to conduct these Wellness Visits which will also include a thorough review of all your medications.    As you primary medical provider recommend that you make an appointment for your Annual Wellness Visit if you have not done so already this year.  You may set up this appointment before you leave today or you may call back ([phone]) and schedule an appointment.  Please make sure when you call that you mention that you are scheduling your Annual Wellness Visit with the clinical pharmacist so that the appointment may be made for the proper length of time.     Continue current medications. Continue good therapeutic lifestyle changes which include good diet and exercise. Fall precautions discussed  with patient. If an FOBT was given today- please return it to our front desk. If you are over 74 years old - you may need Prevnar 68 or the adult Pneumonia vaccine.  Flu Shots will be available at our office starting mid- September. Please call and schedule a FLU CLINIC APPOINTMENT.   Continue follow-up for abnormal mammogram as planned Continue follow-up for Parkinson's with Dr. Hyman Bower and make an appointment so that he can start the treatment he has recommended This winter drink plenty of fluids and keep well hydrated Most importantly for your Parkinson's disease, exercise regularly and frequently. Walking is good. Continue to be careful and did not put yourself at risk for falling Return the FOBT We will call you with the results of your lab work as soon as that becomes available Continue to monitor blood pressures at home and bring readings to office visits for  review Watch sodium intake   Arrie Senate MD

## 2014-08-17 NOTE — Patient Instructions (Addendum)
Medicare Annual Wellness Visit  [facility] and the medical providers at [facility] strive to bring you the best medical care.  In doing so we not only want to address your current medical conditions and concerns but also to detect new conditions early and prevent illness, disease and health-related problems.    Medicare offers a yearly Wellness Visit which allows our clinical staff to assess your need for preventative services including immunizations, lifestyle education, counseling to decrease risk of preventable diseases and screening for fall risk and other medical concerns.    This visit is provided free of charge (no copay) for all Medicare recipients. The clinical pharmacists at [facility] have begun to conduct these Wellness Visits which will also include a thorough review of all your medications.    As you primary medical provider recommend that you make an appointment for your Annual Wellness Visit if you have not done so already this year.  You may set up this appointment before you leave today or you may call back ([phone]) and schedule an appointment.  Please make sure when you call that you mention that you are scheduling your Annual Wellness Visit with the clinical pharmacist so that the appointment may be made for the proper length of time.     Continue current medications. Continue good therapeutic lifestyle changes which include good diet and exercise. Fall precautions discussed with patient. If an FOBT was given today- please return it to our front desk. If you are over 47 years old - you may need Prevnar 25 or the adult Pneumonia vaccine.  Flu Shots will be available at our office starting mid- September. Please call and schedule a FLU CLINIC APPOINTMENT.   Continue follow-up for abnormal mammogram as planned Continue follow-up for Parkinson's with Dr. Hyman Bower and make an appointment so that he can  start the treatment he has recommended This winter drink plenty of fluids and keep well hydrated Most importantly for your Parkinson's disease, exercise regularly and frequently. Walking is good. Continue to be careful and did not put yourself at risk for falling Return the FOBT We will call you with the results of your lab work as soon as that becomes available Continue to monitor blood pressures at home and bring readings to office visits for review Watch sodium intake

## 2014-08-18 LAB — HEPATIC FUNCTION PANEL
ALK PHOS: 75 IU/L (ref 39–117)
ALT: 16 IU/L (ref 0–32)
AST: 23 IU/L (ref 0–40)
Albumin: 4.4 g/dL (ref 3.5–4.8)
BILIRUBIN TOTAL: 0.4 mg/dL (ref 0.0–1.2)
Bilirubin, Direct: 0.13 mg/dL (ref 0.00–0.40)
Total Protein: 6.7 g/dL (ref 6.0–8.5)

## 2014-08-18 LAB — NMR, LIPOPROFILE
CHOLESTEROL: 214 mg/dL — AB (ref 100–199)
HDL Cholesterol by NMR: 71 mg/dL (ref >39)
HDL Particle Number: 42.9 umol/L (ref >=30.5)
LDL Particle Number: 1460 nmol/L — ABNORMAL HIGH (ref <1000)
LDL Size: 20.8 nm (ref >20.5)
LDL-C: 123 mg/dL — AB (ref 0–99)
LP-IR Score: <25 (ref <=45)
Small LDL Particle Number: 521 nmol/L (ref <=527)
Triglycerides by NMR: 102 mg/dL (ref 0–149)

## 2014-08-18 LAB — BMP8+EGFR
BUN/Creatinine Ratio: 14 (ref 11–26)
BUN: 12 mg/dL (ref 8–27)
CALCIUM: 9.8 mg/dL (ref 8.7–10.3)
CO2: 28 mmol/L (ref 18–29)
Chloride: 102 mmol/L (ref 97–108)
Creatinine, Ser: 0.87 mg/dL (ref 0.57–1.00)
GFR calc Af Amer: 77 mL/min/{1.73_m2} (ref >59)
GFR, EST NON AFRICAN AMERICAN: 67 mL/min/{1.73_m2} (ref >59)
GLUCOSE: 92 mg/dL (ref 65–99)
POTASSIUM: 4.5 mmol/L (ref 3.5–5.2)
Sodium: 143 mmol/L (ref 134–144)

## 2014-08-18 LAB — VITAMIN D 25 HYDROXY (VIT D DEFICIENCY, FRACTURES): VIT D 25 HYDROXY: 34.7 ng/mL (ref 30.0–100.0)

## 2014-09-07 ENCOUNTER — Telehealth: Payer: Self-pay | Admitting: *Deleted

## 2014-09-07 MED ORDER — NONFORMULARY OR COMPOUNDED ITEM: Status: DC

## 2014-09-07 NOTE — Telephone Encounter (Signed)
Pt was seen at [facility] 07/28/15 her compound estradiol was never called into pharmacy, Rx will be called in.

## 2014-10-09 ENCOUNTER — Encounter: Payer: Self-pay | Admitting: *Deleted

## 2014-10-16 DIAGNOSIS — D485 Neoplasm of uncertain behavior of skin: Secondary | ICD-10-CM | POA: Diagnosis not present

## 2014-10-16 DIAGNOSIS — Z85828 Personal history of other malignant neoplasm of skin: Secondary | ICD-10-CM | POA: Diagnosis not present

## 2014-10-16 DIAGNOSIS — L570 Actinic keratosis: Secondary | ICD-10-CM | POA: Diagnosis not present

## 2014-10-16 DIAGNOSIS — L820 Inflamed seborrheic keratosis: Secondary | ICD-10-CM | POA: Diagnosis not present

## 2015-01-31 ENCOUNTER — Other Ambulatory Visit: Payer: Self-pay

## 2015-01-31 ENCOUNTER — Other Ambulatory Visit: Payer: Self-pay | Admitting: Family Medicine

## 2015-01-31 DIAGNOSIS — N6012 Diffuse cystic mastopathy of left breast: Secondary | ICD-10-CM

## 2015-02-12 ENCOUNTER — Other Ambulatory Visit: Payer: Self-pay | Admitting: Family Medicine

## 2015-02-13 NOTE — Telephone Encounter (Signed)
Last seen and last lipid 08/17/14 [facility]  Requesting 90 day supply

## 2015-02-14 ENCOUNTER — Other Ambulatory Visit: Payer: Self-pay | Admitting: Family Medicine

## 2015-02-15 ENCOUNTER — Ambulatory Visit: Payer: Medicare Other | Admitting: Family Medicine

## 2015-02-16 ENCOUNTER — Encounter: Payer: Self-pay | Admitting: Family Medicine

## 2015-02-16 ENCOUNTER — Ambulatory Visit (INDEPENDENT_AMBULATORY_CARE_PROVIDER_SITE_OTHER): Payer: Medicare Other | Admitting: Family Medicine

## 2015-02-16 VITALS — BP 147/96 | HR 81 | Temp 98.2°F | Ht 63.0 in | Wt 150.0 lb

## 2015-02-16 DIAGNOSIS — C439 Malignant melanoma of skin, unspecified: Secondary | ICD-10-CM | POA: Diagnosis not present

## 2015-02-16 DIAGNOSIS — I10 Essential (primary) hypertension: Secondary | ICD-10-CM

## 2015-02-16 DIAGNOSIS — E559 Vitamin D deficiency, unspecified: Secondary | ICD-10-CM | POA: Diagnosis not present

## 2015-02-16 DIAGNOSIS — G20 Parkinson's disease: Secondary | ICD-10-CM | POA: Diagnosis not present

## 2015-02-16 DIAGNOSIS — E785 Hyperlipidemia, unspecified: Secondary | ICD-10-CM | POA: Diagnosis not present

## 2015-02-16 LAB — POCT CBC
Granulocyte percent: 68.4 %G (ref 37–80)
HCT, POC: 44.5 % (ref 37.7–47.9)
Hemoglobin: 14.2 g/dL (ref 12.2–16.2)
Lymph, poc: 1.5 (ref 0.6–3.4)
MCH: 28.9 pg (ref 27–31.2)
MCHC: 31.8 g/dL (ref 31.8–35.4)
MCV: 90.9 fL (ref 80–97)
MPV: 7.6 fL (ref 0–99.8)
POC Granulocyte: 4 (ref 2–6.9)
POC LYMPH PERCENT: 25.3 %L (ref 10–50)
Platelet Count, POC: 224 10*3/uL (ref 142–424)
RBC: 4.9 M/uL (ref 4.04–5.48)
RDW, POC: 13.4 %
WBC: 5.8 10*3/uL (ref 4.6–10.2)

## 2015-02-16 NOTE — Progress Notes (Signed)
Subjective:    Patient ID: Tracey Juarez, female    DOB: [birth year], 74 y.o.   MRN: [id number]  HPI Pt here for follow up and management of chronic medical problems which includes hypertension. The patient also has Parkinson's disease and she is followed regularly by the neurologist. Parkinson's is worse on the right. She exercises regularly and stays active daily.  She is taking medications regularly. She is also followed by a skin specialist for her melanoma. She denies chest pain shortness of breath trouble swallowing and has only occasional heartburn. She is passing her water and not too frequently with taking the oxybutynin. He understands that the Benadryl and the oxybutynin may contribute to making her mouth more dry than usual.     Patient Active Problem List   Diagnosis Date Noted  . Osteopenia 08/15/2013  . Parkinson disease   . Hypertension   . Atrophic vaginitis   . Detrusor instability   . Melanoma   . Mixed basal-squamous cell carcinoma    Outpatient Encounter Prescriptions as of 02/16/2015  Medication Sig  . AZILECT 1 MG TABS tablet Take 1 tablet by mouth daily.  . Cholecalciferol (VITAMIN D) 2000 UNITS CAPS Take 1 capsule by mouth daily.  . clopidogrel (PLAVIX) 75 MG tablet Take 75 mg by mouth daily.  . diphenhydrAMINE (BENADRYL) 25 mg capsule Take 25 mg by mouth every 6 (six) hours as needed (for parkinson's symptoms).   Marland Kitchen lisinopril (PRINIVIL,ZESTRIL) 10 MG tablet TAKE ONE TABLET BY MOUTH ONCE DAILY  . Magnesium 250 MG TABS Take 1 tablet by mouth daily.  . Multiple Vitamin (MULTIVITAMIN) capsule Take 1 capsule by mouth daily.    . NONFORMULARY OR COMPOUNDED ITEM Estradiol 0.02% vaginal cream place vaginally twice weekly  . oxybutynin (DITROPAN) 5 MG tablet Take 1 tablet (5 mg total) by mouth 2 (two) times daily.  . ranitidine (ZANTAC) 150 MG tablet Take 150 mg by mouth 2 (two) times daily.  . simvastatin (ZOCOR) 40 MG tablet TAKE ONE TABLET BY MOUTH ONCE DAILY AT  BEDTIME  . [DISCONTINUED] Calcium Carbonate-Vit D-Min (CALTRATE PLUS PO) Take by mouth daily.    No facility-administered encounter medications on file as of 02/16/2015.      Review of Systems  Constitutional: Negative.   HENT: Negative.        DRY mouth  Eyes: Negative.   Respiratory: Negative.   Cardiovascular: Negative.   Gastrointestinal: Negative.   Endocrine: Negative.   Genitourinary: Negative.   Musculoskeletal: Negative.   Skin: Negative.   Allergic/Immunologic: Negative.   Neurological: Negative.   Hematological: Negative.   Psychiatric/Behavioral: Negative.        Objective:   Physical Exam  Constitutional: She is oriented to person, place, and time. She appears well-developed and well-nourished. No distress.  She is pleasant and alert and has a noticeable right-sided tremor but not constantly. He is also somewhat kyphotic in appearance.  HENT:  Head: Normocephalic and atraumatic.  Right Ear: External ear normal.  Left Ear: External ear normal.  Nose: Nose normal.  Mouth/Throat: Oropharynx is clear and moist. No oropharyngeal exudate.  Eyes: Conjunctivae and EOM are normal. Pupils are equal, round, and reactive to light. Right eye exhibits no discharge. Left eye exhibits no discharge. No scleral icterus.  Neck: Normal range of motion. Neck supple. No thyromegaly present.  Without bruits or thyromegaly  Cardiovascular: Normal rate, regular rhythm, normal heart sounds and intact distal pulses.   No murmur heard. 84/m  Pulmonary/Chest: Effort normal  and breath sounds normal. No respiratory distress. She has no wheezes. She has no rales. She exhibits no tenderness.  Clear anteriorly and posteriorly  Abdominal: Soft. Bowel sounds are normal. She exhibits no mass. There is no tenderness. There is no rebound and no guarding.  No bruits or organ enlargement or inguinal adenopathy  Musculoskeletal: Normal range of motion. She exhibits no edema or tenderness.  A resting  tremor was noticed on the right side at times during the visit but not  Lymphadenopathy:    She has no cervical adenopathy.  Neurological: She is alert and oriented to person, place, and time. She has normal reflexes. No cranial nerve deficit.  Right-sided tremor  Skin: Skin is warm and dry. No rash noted.  Psychiatric: She has a normal mood and affect. Her behavior is normal. Judgment and thought content normal.  Nursing note and vitals reviewed.  BP 147/96 mmHg  Pulse 81  Temp(Src) 98.2 F (36.8 C) (Oral)  Ht '5\' 3"'  (1.6 m)  Wt 150 lb (68.04 kg)  BMI 26.58 kg/m2        Assessment & Plan:  1. Hyperlipemia -10 you with current treatment pending results of lab work - POCT CBC - NMR, lipoprofile  2. Essential hypertension -The blood pressure in our office was elevated but she brings readings from home using a wrist monitor that are all excellent. The patient will return to the office with her wrist monitor and compare those readings to the readings we have in the office. The readings she brings in today will be scanned into the record. - POCT CBC - BMP8+EGFR - Hepatic function panel  3. Vitamin D deficiency -Continue vitamin D replacement pending results of lab work - POCT CBC - Vit D  25 hydroxy (rtn osteoporosis monitoring)  4. Parkinson disease -Continue follow-up with neurology - POCT CBC  5. Melanoma of skin -Continue follow-up with dermatology  Patient Instructions                       Medicare Annual Wellness Visit  [facility] and the medical providers at [facility] strive to bring you the best medical care.  In doing so we not only want to address your current medical conditions and concerns but also to detect new conditions early and prevent illness, disease and health-related problems.    Medicare offers a yearly Wellness Visit which allows our clinical staff to assess your need for preventative services including immunizations,  lifestyle education, counseling to decrease risk of preventable diseases and screening for fall risk and other medical concerns.    This visit is provided free of charge (no copay) for all Medicare recipients. The clinical pharmacists at [facility] have begun to conduct these Wellness Visits which will also include a thorough review of all your medications.    As you primary medical provider recommend that you make an appointment for your Annual Wellness Visit if you have not done so already this year.  You may set up this appointment before you leave today or you may call back ([phone]) and schedule an appointment.  Please make sure when you call that you mention that you are scheduling your Annual Wellness Visit with the clinical pharmacist so that the appointment may be made for the proper length of time.     Continue current medications. Continue good therapeutic lifestyle changes which include good diet and exercise. Fall precautions discussed with patient. If an FOBT  was given today- please return it to our front desk. If you are over 61 years old - you may need Prevnar 31 or the adult Pneumonia vaccine.  Flu Shots are still available at our office. If you still haven't had one please call to set up a nurse visit to get one.   After your visit with [place] today you will receive a survey in the mail or online from [employer] regarding your care with [place]. Please take a moment to fill this out. Your feedback is very important to [place] as you can help [place] better understand your patient needs as well as improve your experience and satisfaction. WE CARE ABOUT YOU!!!   Stay active exercise regularly and drink plenty of water and fluids Follow-up with neurologist yearly Follow-up with dermatology as directed   Arrie Senate MD

## 2015-02-16 NOTE — Patient Instructions (Addendum)
Medicare Annual Wellness Visit  [facility] and the medical providers at [facility] strive to bring you the best medical care.  In doing so we not only want to address your current medical conditions and concerns but also to detect new conditions early and prevent illness, disease and health-related problems.    Medicare offers a yearly Wellness Visit which allows our clinical staff to assess your need for preventative services including immunizations, lifestyle education, counseling to decrease risk of preventable diseases and screening for fall risk and other medical concerns.    This visit is provided free of charge (no copay) for all Medicare recipients. The clinical pharmacists at [facility] have begun to conduct these Wellness Visits which will also include a thorough review of all your medications.    As you primary medical provider recommend that you make an appointment for your Annual Wellness Visit if you have not done so already this year.  You may set up this appointment before you leave today or you may call back ([phone]) and schedule an appointment.  Please make sure when you call that you mention that you are scheduling your Annual Wellness Visit with the clinical pharmacist so that the appointment may be made for the proper length of time.     Continue current medications. Continue good therapeutic lifestyle changes which include good diet and exercise. Fall precautions discussed with patient. If an FOBT was given today- please return it to our front desk. If you are over 67 years old - you may need Prevnar 71 or the adult Pneumonia vaccine.  Flu Shots are still available at our office. If you still haven't had one please call to set up a nurse visit to get one.   After your visit with [place] today you will receive a survey in the mail or online from [employer] regarding your care with [place]. Please take a moment to  fill this out. Your feedback is very important to [place] as you can help [place] better understand your patient needs as well as improve your experience and satisfaction. WE CARE ABOUT YOU!!!   Stay active exercise regularly and drink plenty of water and fluids Follow-up with neurologist yearly Follow-up with dermatology as directed Please bring wrist monitor by the office and get comparable readings with our monitor in the office If there is a big discrepancy consider getting an [employer] blood pressure monitor for the arm

## 2015-02-17 LAB — NMR, LIPOPROFILE
Cholesterol: 218 mg/dL — ABNORMAL HIGH (ref 100–199)
HDL Cholesterol by NMR: 68 mg/dL (ref >39)
HDL Particle Number: 42 umol/L (ref >=30.5)
LDL PARTICLE NUMBER: 1673 nmol/L — AB (ref <1000)
LDL Size: 20.6 nm (ref >20.5)
LDL-C: 131 mg/dL — AB (ref 0–99)
LP-IR Score: 34 (ref <=45)
Small LDL Particle Number: 757 nmol/L — ABNORMAL HIGH (ref <=527)
Triglycerides by NMR: 93 mg/dL (ref 0–149)

## 2015-02-17 LAB — BMP8+EGFR
BUN / CREAT RATIO: 15 (ref 11–26)
BUN: 14 mg/dL (ref 8–27)
CHLORIDE: 101 mmol/L (ref 97–108)
CO2: 25 mmol/L (ref 18–29)
Calcium: 9.4 mg/dL (ref 8.7–10.3)
Creatinine, Ser: 0.92 mg/dL (ref 0.57–1.00)
GFR calc Af Amer: 72 mL/min/{1.73_m2} (ref >59)
GFR calc non Af Amer: 62 mL/min/{1.73_m2} (ref >59)
Glucose: 97 mg/dL (ref 65–99)
POTASSIUM: 4.3 mmol/L (ref 3.5–5.2)
Sodium: 140 mmol/L (ref 134–144)

## 2015-02-17 LAB — HEPATIC FUNCTION PANEL
ALT: 15 IU/L (ref 0–32)
AST: 20 IU/L (ref 0–40)
Albumin: 4.5 g/dL (ref 3.5–4.8)
Alkaline Phosphatase: 60 IU/L (ref 39–117)
Bilirubin Total: 0.6 mg/dL (ref 0.0–1.2)
Bilirubin, Direct: 0.15 mg/dL (ref 0.00–0.40)
Total Protein: 6.8 g/dL (ref 6.0–8.5)

## 2015-02-17 LAB — VITAMIN D 25 HYDROXY (VIT D DEFICIENCY, FRACTURES): VIT D 25 HYDROXY: 42.9 ng/mL (ref 30.0–100.0)

## 2015-02-20 ENCOUNTER — Other Ambulatory Visit: Payer: Self-pay | Admitting: *Deleted

## 2015-02-20 MED ORDER — ROSUVASTATIN CALCIUM 20 MG PO TABS: 20.0000 mg | ORAL_TABLET | Freq: Every day | ORAL | Status: DC

## 2015-02-22 ENCOUNTER — Telehealth: Payer: Self-pay | Admitting: Family Medicine

## 2015-02-22 NOTE — Telephone Encounter (Signed)
Patient had already filled her simvastatin for a 90 day supply so she prefers to stay on it and not change to crestor.  She will discuss new medication at her next appointment with Dr. Laurance Flatten.

## 2015-02-27 ENCOUNTER — Ambulatory Visit (INDEPENDENT_AMBULATORY_CARE_PROVIDER_SITE_OTHER): Payer: Medicare Other | Admitting: Family

## 2015-02-27 ENCOUNTER — Encounter: Payer: Self-pay | Admitting: Family

## 2015-02-27 VITALS — BP 160/73 | HR 76 | Temp 97.8°F | Ht 63.0 in | Wt 153.0 lb

## 2015-02-27 DIAGNOSIS — H9312 Tinnitus, left ear: Secondary | ICD-10-CM | POA: Diagnosis not present

## 2015-02-27 DIAGNOSIS — R42 Dizziness and giddiness: Secondary | ICD-10-CM | POA: Diagnosis not present

## 2015-02-27 MED ORDER — MECLIZINE HCL 50 MG PO TABS: 50.0000 mg | ORAL_TABLET | Freq: Three times a day (TID) | ORAL | Status: DC | PRN

## 2015-02-27 NOTE — Progress Notes (Signed)
   Subjective:    Patient ID: Tracey Juarez, female    DOB: [birth year], 74 y.o.   MRN: [id number]  HPI Pt presents to the office today for "sizzling of her ears". Pt states it really isn't ringing, but of a sizzling sound of her left ear. Pt states this has happened before and put sweet oil in them and it seemed to help. Pt states she feels dizzy and swimmy since she started hearing this sizzling noise. Pt denies any hearing loss or pain in her ear.   Review of Systems  Constitutional: Negative.   HENT: Negative.   Eyes: Negative.   Respiratory: Negative.  Negative for shortness of breath.   Cardiovascular: Negative.  Negative for palpitations.  Gastrointestinal: Negative.   Endocrine: Negative.   Genitourinary: Negative.   Musculoskeletal: Negative.   Neurological: Negative.  Negative for headaches.  Hematological: Negative.   Psychiatric/Behavioral: Negative.   All other systems reviewed and are negative.      Objective:   Physical Exam  Constitutional: She is oriented to person, place, and time. She appears well-developed and well-nourished. No distress.  HENT:  Head: Normocephalic and atraumatic.  Right Ear: External ear normal.  Left Ear: External ear normal.  Nose: Nose normal.  Mouth/Throat: Oropharynx is clear and moist.  Eyes: Pupils are equal, round, and reactive to light.  Neck: Normal range of motion. Neck supple. No thyromegaly present.  Cardiovascular: Normal rate, regular rhythm, normal heart sounds and intact distal pulses.   No murmur heard. Pulmonary/Chest: Effort normal and breath sounds normal. No respiratory distress. She has no wheezes.  Abdominal: Soft. Bowel sounds are normal. She exhibits no distension. There is no tenderness.  Musculoskeletal: Normal range of motion. She exhibits no edema or tenderness.  Neurological: She is alert and oriented to person, place, and time. She has normal reflexes. No cranial nerve deficit.  Skin: Skin is warm and  dry.  Psychiatric: She has a normal mood and affect. Her behavior is normal. Judgment and thought content normal.  Vitals reviewed.   BP 160/73 mmHg  Pulse 76  Temp(Src) 97.8 F (36.6 C) (Oral)  Ht 5\' 3"  (1.6 m)  Wt 153 lb (69.4 kg)  BMI 27.11 kg/m2      Assessment & Plan:  1. Tinnitus, left - Ambulatory referral to ENT  2. Dizziness -Falls precaution discussed -Discussed  counting to 5 before she starts walking when she stands up -Discussed removing all rugs and fall hazards from her home -RTO prn - meclizine (ANTIVERT) 50 MG tablet; Take 1 tablet (50 mg total) by mouth 3 (three) times daily as needed.  Dispense: 30 tablet; Refill: 0 - Ambulatory referral to ENT  Evelina Dun, FNP

## 2015-02-27 NOTE — Patient Instructions (Signed)
Tinnitus °Sounds you hear in your ears and coming from within the ear is called tinnitus. This can be a symptom of many ear disorders. It is often associated with hearing loss.  °Tinnitus can be seen with: °· Infections. °· Ear blockages such as wax buildup. °· Meniere's disease. °· Ear damage. °· Inherited. °· Occupational causes. °While irritating, it is not usually a threat to health. When the cause of the tinnitus is wax, infection in the middle ear, or foreign body it is easily treated. Hearing loss will usually be reversible.  °TREATMENT  °When treating the underlying cause does not get rid of tinnitus, it may be necessary to get rid of the unwanted sound by covering it up with more pleasant background noises. This may include music, the radio etc. There are tinnitus maskers which can be worn which produce background noise to cover up the tinnitus. °Avoid all medications which tend to make tinnitus worse such as alcohol, caffeine, aspirin, and nicotine. There are many soothing background tapes such as rain, ocean, thunderstorms, etc. These soothing sounds help with sleeping or resting. °Keep all follow-up appointments and referrals. This is important to identify the cause of the problem. It also helps avoid complications, impaired hearing, disability, or chronic pain. °Document Released: 07/28/2005 Document Revised: 10/20/2011 Document Reviewed: 03/15/2008 °ExitCare® Patient Information ©2015 [employer]. This information is not intended to replace advice given to you by your health care provider. Make sure you discuss any questions you have with your health care provider. ° °

## 2015-03-05 ENCOUNTER — Telehealth: Payer: Self-pay | Admitting: Family Medicine

## 2015-03-05 NOTE — Telephone Encounter (Signed)
Pt informed of ENT appt and that letter is in the mail regarding this

## 2015-03-06 ENCOUNTER — Other Ambulatory Visit: Payer: Self-pay | Admitting: Family Medicine

## 2015-03-06 ENCOUNTER — Ambulatory Visit
Admission: RE | Admit: 2015-03-06 | Discharge: 2015-03-06 | Disposition: A | Discharge status: Home / Self Care | Payer: Medicare Other | Source: Ambulatory Visit | Attending: Family Medicine | Admitting: Family Medicine

## 2015-03-06 ENCOUNTER — Telehealth: Payer: Self-pay

## 2015-03-06 DIAGNOSIS — N6012 Diffuse cystic mastopathy of left breast: Secondary | ICD-10-CM

## 2015-03-06 DIAGNOSIS — N6489 Other specified disorders of breast: Secondary | ICD-10-CM | POA: Diagnosis not present

## 2015-03-07 NOTE — Telephone Encounter (Signed)
Completed - Contacted Dr.Meyer regarding patient stopping plavix for L breast Biopsy. Per Dr. Bjorn Loser pt may stop Plavix 5-7 days prior to biopsy and restart Plavix the day after biopsy.    By Bary Leriche, RN

## 2015-03-08 ENCOUNTER — Other Ambulatory Visit: Payer: Medicare Other

## 2015-03-15 ENCOUNTER — Ambulatory Visit
Admission: RE | Admit: 2015-03-15 | Discharge: 2015-03-15 | Disposition: A | Discharge status: Home / Self Care | Payer: Medicare Other | Source: Ambulatory Visit | Attending: Family Medicine | Admitting: Family Medicine

## 2015-03-15 DIAGNOSIS — C50412 Malignant neoplasm of upper-outer quadrant of left female breast: Secondary | ICD-10-CM | POA: Diagnosis not present

## 2015-03-15 DIAGNOSIS — N6012 Diffuse cystic mastopathy of left breast: Secondary | ICD-10-CM

## 2015-03-15 DIAGNOSIS — D0502 Lobular carcinoma in situ of left breast: Secondary | ICD-10-CM | POA: Diagnosis not present

## 2015-03-15 DIAGNOSIS — R928 Other abnormal and inconclusive findings on diagnostic imaging of breast: Secondary | ICD-10-CM | POA: Diagnosis not present

## 2015-03-15 DIAGNOSIS — N6489 Other specified disorders of breast: Secondary | ICD-10-CM | POA: Diagnosis not present

## 2015-03-16 ENCOUNTER — Other Ambulatory Visit: Payer: Medicare Other

## 2015-03-16 DIAGNOSIS — R42 Dizziness and giddiness: Secondary | ICD-10-CM | POA: Diagnosis not present

## 2015-03-16 DIAGNOSIS — H938X2 Other specified disorders of left ear: Secondary | ICD-10-CM | POA: Diagnosis not present

## 2015-03-16 DIAGNOSIS — H9312 Tinnitus, left ear: Secondary | ICD-10-CM | POA: Diagnosis not present

## 2015-03-18 ENCOUNTER — Other Ambulatory Visit: Payer: Self-pay | Admitting: Family

## 2015-03-19 ENCOUNTER — Other Ambulatory Visit: Payer: Self-pay | Admitting: Family Medicine

## 2015-03-19 ENCOUNTER — Telehealth: Payer: Self-pay | Admitting: Family Medicine

## 2015-03-19 DIAGNOSIS — C50912 Malignant neoplasm of unspecified site of left female breast: Secondary | ICD-10-CM

## 2015-03-20 ENCOUNTER — Encounter: Payer: Self-pay | Admitting: *Deleted

## 2015-03-20 ENCOUNTER — Other Ambulatory Visit: Payer: Self-pay

## 2015-03-20 ENCOUNTER — Telehealth: Payer: Self-pay | Admitting: *Deleted

## 2015-03-20 ENCOUNTER — Other Ambulatory Visit: Payer: Self-pay | Admitting: Family Medicine

## 2015-03-20 DIAGNOSIS — R922 Inconclusive mammogram: Secondary | ICD-10-CM

## 2015-03-20 DIAGNOSIS — C50412 Malignant neoplasm of upper-outer quadrant of left female breast: Secondary | ICD-10-CM

## 2015-03-20 HISTORY — DX: Malignant neoplasm of upper-outer quadrant of left female breast: C50.412

## 2015-03-20 NOTE — Telephone Encounter (Signed)
Left vm for pt to return call regarding [facility] appt for 03/28/15 

## 2015-03-20 NOTE — Telephone Encounter (Signed)
Confirmed [facility] for 03/28/15 at 0800 .  Instructions and contact information given.

## 2015-03-22 ENCOUNTER — Ambulatory Visit
Admission: RE | Admit: 2015-03-22 | Discharge: 2015-03-22 | Disposition: A | Discharge status: Home / Self Care | Payer: Medicare Other | Source: Ambulatory Visit | Attending: Family Medicine | Admitting: Family Medicine

## 2015-03-22 DIAGNOSIS — R921 Mammographic calcification found on diagnostic imaging of breast: Secondary | ICD-10-CM | POA: Diagnosis not present

## 2015-03-22 DIAGNOSIS — R922 Inconclusive mammogram: Secondary | ICD-10-CM

## 2015-03-22 DIAGNOSIS — C50412 Malignant neoplasm of upper-outer quadrant of left female breast: Secondary | ICD-10-CM | POA: Diagnosis not present

## 2015-03-22 DIAGNOSIS — C50912 Malignant neoplasm of unspecified site of left female breast: Secondary | ICD-10-CM

## 2015-03-22 MED ORDER — GADOBENATE DIMEGLUMINE 529 MG/ML IV SOLN
13.0000 mL | Freq: Once | INTRAVENOUS | Status: AC | PRN
  Administered 2015-03-22: 13 mL via INTRAVENOUS

## 2015-03-23 ENCOUNTER — Telehealth: Payer: Self-pay | Admitting: Family Medicine

## 2015-03-23 NOTE — Telephone Encounter (Signed)
FYI

## 2015-03-26 NOTE — Telephone Encounter (Signed)
Patient aware that we can see MRI results in our system.  If she would like to bring in CD at her next appointment with Dr. Laurance Flatten she could.  Patient also stated she had breast clinic Wednesday and would like to take CD with her there.

## 2015-03-27 NOTE — Progress Notes (Signed)
[place]  Telephone:(336) [phone] Fax:([phone]) [place] Note   Patient Care Team: Chipper Herb, MD as PCP - General (Family Medicine) Anastasio Auerbach, MD as Consulting Physician (Gynecology) Freeman Caldron. Bjorn Loser, MD as Referring Physician (Urology) Erroll Luna, MD as Consulting Physician (General Surgery) Truitt Merle, MD as Consulting Physician (Hematology) Thea Silversmith, MD as Consulting Physician (Radiation Oncology) Mauro Kaufmann, RN as Registered Nurse Rockwell Germany, RN as Registered Nurse 03/28/2015  CHIEF COMPLAINTS/PURPOSE OF CONSULTATION:  Newly diagnosed left breast cancer  Oncology History   Breast cancer of upper-outer quadrant of left female breast   Staging form: Breast, AJCC 7th Edition     Clinical stage from 03/19/2015: Stage IIA (T2, N0, M0) - Signed by Truitt Merle, MD on 03/27/2015       Breast cancer of upper-outer quadrant of left female breast   03/06/2015 Mammogram Hypoechoic area in the left breast 1:00 position, 3 cm from the nipple, measuring 1.9 x 1.1 x 1.2 cm, corresponding to an area of architectural distortion on mammogram.   03/19/2015 Receptors her2 ER 100% positive, PR 50% positive, HER-2 negative, Ki-67 5%.   03/19/2015 Initial Biopsy Left breast core needle biopsy showed invasive lobular carcinoma, grade 1-2, lobular carcinoma in situ.    03/20/2015 Initial Diagnosis Breast cancer of upper-outer quadrant of left female breast   03/23/2015 Imaging 2.1 x 1.2 x 1.7 cm irregular enhancement within the upper outer left breast compatible with biopsy-proven malignancy. No other abnormalities.    HISTORY OF PRESENTING ILLNESS:  Tracey Juarez 74 y.o. female is here because of recently diagnosed left breast cancer. She presents to our multidisciplinary clinic with her husband.  This was discovered by screening mammogram, she did not feel the lump. She denies any skin or nipple change, no constitutional symptoms. The moment  mammogram and ultrasound revealed a 1.9 cm mass at 1:00 position of left breast, core needle biopsy showed invasive lobular carcinoma. ER/PR stripe positive, HER-2 negative. She underwent breast MI after biopsy, which showed a 2.1 cm irregular enhancement mass in the upper outer left breast.   She has Parkinson disesease which diagnosed 5 years ago, mild, very physically active. She has no family history of breast or ovary cancer. She has no children.  MEDICAL HISTORY:  Past Medical History  Diagnosis Date  . Atrophic vaginitis   . Detrusor instability   . Mixed basal-squamous cell carcinoma     skin  . Melanoma   . Elevated cholesterol   . Hypertension   . Parkinson disease   . Breast cancer of upper-outer quadrant of left female breast 03/20/2015  . Hot flashes     SURGICAL HISTORY: Past Surgical History  Procedure Laterality Date  . Tonsillectomy    . Foot surgery  2004  . Knee surgery  2007  . Skin cancer excised     GYN HISTORY  Menarchal: 11 LMP: 51 Contraceptive: no  HRT: 10 years G0P0:    SOCIAL HISTORY: Social History   Social History  . Marital Status: Married    Spouse Name: N/A  . Number of Children: No   . Years of Education: N/A   Occupational History  . Retired    Social History Main Topics  . Smoking status: Never Smoker   . Smokeless tobacco: Never Used  . Alcohol Use: No  . Drug Use: No  . Sexual Activity: No   Other Topics Concern  . Not on file  Social History Narrative    FAMILY HISTORY: Family History  Problem Relation Age of Onset  . Hypertension Mother   . Heart attack Mother   . Heart attack Maternal Aunt   . Heart attack Maternal Uncle     ALLERGIES:  has No Known Allergies.  MEDICATIONS:  Current Outpatient Prescriptions  Medication Sig Dispense Refill  . AZILECT 1 MG TABS tablet Take 1 tablet by mouth daily.    . Cholecalciferol (VITAMIN D) 2000 UNITS CAPS Take 1 capsule by mouth daily.    . clopidogrel (PLAVIX)  75 MG tablet Take 75 mg by mouth daily.    . diphenhydrAMINE (BENADRYL) 25 mg capsule Take 25 mg by mouth every 6 (six) hours as needed (for parkinson's symptoms).     Marland Kitchen lisinopril (PRINIVIL,ZESTRIL) 10 MG tablet TAKE ONE TABLET BY MOUTH ONCE DAILY 90 tablet 4  . Magnesium 250 MG TABS Take 1 tablet by mouth daily.    . meclizine (ANTIVERT) 25 MG tablet TAKE TWO TABLETS BY MOUTH THREE TIMES DAILY AS NEEDED 60 tablet 2  . Multiple Vitamin (MULTIVITAMIN) capsule Take 1 capsule by mouth daily.      . NONFORMULARY OR COMPOUNDED ITEM Estradiol 0.02% vaginal cream place vaginally twice weekly 90 each 3  . oxybutynin (DITROPAN) 5 MG tablet Take 1 tablet (5 mg total) by mouth 2 (two) times daily. 180 tablet 4  . ranitidine (ZANTAC) 150 MG tablet Take 150 mg by mouth 2 (two) times daily as needed.     . simvastatin (ZOCOR) 40 MG tablet Take 40 mg by mouth daily.     No current facility-administered medications for this visit.    REVIEW OF SYSTEMS:   Constitutional: Denies fevers, chills or abnormal night sweats Eyes: Denies blurriness of vision, double vision or watery eyes Ears, nose, mouth, throat, and face: Denies mucositis or sore throat Respiratory: Denies cough, dyspnea or wheezes Cardiovascular: Denies palpitation, chest discomfort or lower extremity swelling Gastrointestinal:  Denies nausea, heartburn or change in bowel habits Skin: Denies abnormal skin rashes Lymphatics: Denies new lymphadenopathy or easy bruising Neurological:Denies numbness, tingling or new weaknesses Behavioral/Psych: Mood is stable, no new changes  All other systems were reviewed with the patient and are negative.  PHYSICAL EXAMINATION: ECOG PERFORMANCE STATUS: 0 - Asymptomatic  Filed Vitals:   03/28/15 0904  BP: 151/74  Pulse: 80  Temp: 99.1 F (37.3 C)  Resp: 18   Filed Weights   03/28/15 0904  Weight: 149 lb (67.586 kg)    GENERAL:alert, no distress and comfortable SKIN: skin color, texture, turgor  are normal, no rashes or significant lesions EYES: normal, conjunctiva are pink and non-injected, sclera clear OROPHARYNX:no exudate, no erythema and lips, buccal mucosa, and tongue normal  NECK: supple, thyroid normal size, non-tender, without nodularity LYMPH:  no palpable lymphadenopathy in the cervical, axillary or inguinal LUNGS: clear to auscultation and percussion with normal breathing effort HEART: regular rate & rhythm and no murmurs and no lower extremity edema ABDOMEN:abdomen soft, non-tender and normal bowel sounds Musculoskeletal:no cyanosis of digits and no clubbing  PSYCH: alert & oriented x 3 with fluent speech NEURO: no focal motor/sensory deficits Breasts: Breast inspection showed them to be symmetrical with no nipple discharge. (+) Diffuse skin ecchymosis at left upper outer quadrant of breast, there is a 1.5 cm mass in the upper outer quadrant of left breast. Palpation of the right breast and axilla revealed no obvious mass that I could appreciate.   LABORATORY DATA:  I have reviewed  the data as listed Lab Results  Component Value Date   WBC 5.6 03/28/2015   HGB 14.4 03/28/2015   HCT 43.1 03/28/2015   MCV 91.1 03/28/2015   PLT 228 03/28/2015    Recent Labs  08/17/14 0956 02/16/15 0947 03/28/15 0801  NA 143 140 142  K 4.5 4.3 4.3  CL 102 101  --   CO2 28 25 30*  GLUCOSE 92 97 104  BUN 12 14 15.1  CREATININE 0.87 0.92 1.1  CALCIUM 9.8 9.4 10.2  GFRNONAA 67 62  --   GFRAA 77 72  --   PROT 6.7 6.8 7.1  ALBUMIN  --   --  4.0  AST '23 20 20  ' ALT '16 15 19  ' ALKPHOS 75 60 64  BILITOT 0.4 0.6 0.47  BILIDIR 0.13 0.15  --    PATHOLOGY REPORT: Diagnosis Breast, left, needle core biopsy, upper outer - INVASIVE LOBULAR CARCINOMA, SEE COMMENT. 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 - CALCIFICATIONS PRESENT. Microscopic Comment Although the grade of tumor is best assessed at resection, with these biopsies, both the in  situ and invasive carcinoma are grade 1-2. There is partial to complete absence of E-cadherin immunostain expression within the invasive carcinoma and in situ carcinoma; supporting a lobular phenotype for each. Breast prognostic studies are pending and will be reported in an addendum. (CRR:ecj 03/19/2015)  Results: IMMUNOHISTOCHEMICAL AND MORPHOMETRIC ANALYSIS BY THE AUTOMATED CELLULAR IMAGING SYSTEM (ACIS) Estrogen Receptor: 100%, POSITIVE, STRONG STAINING INTENSITY (PERFORMED MANUALLY) Progesterone Receptor: 50%, POSITIVE, STRONG STAINING INTENSITY (PERFORMED MANUALLY) Proliferation Marker Ki67: 5% (PERFORMED MANUALLY) Results: HER2 - NEGATIVE RATIO OF HER2/CEP17 SIGNALS 1.21 AVERAGE HER2 COPY NUMBER PER CELL 2.35  RADIOGRAPHIC STUDIES: I have personally reviewed the radiological images as listed and agreed with the findings in the report.  Mr Breast Bilateral W Wo Contrast 03/23/2015   IMPRESSION: 2.1 x 1.2 x 1.7 cm irregular enhancement within the upper outer left breast compatible with biopsy-proven malignancy. No other suspicious abnormalities identified although study is slightly limited by motion artifact.  No mammographic evidence of right breast malignancy.  RECOMMENDATION: Treatment plan  BI-RADS CATEGORY  6: Known biopsy-proven malignancy.   Electronically Signed   By: Margarette Canada M.D.   On: 03/23/2015 12:30   MM and US Breast Ltd Uni Left Inc Axilla 03/06/2015    IMPRESSION: Vague hypoechoic area with associated shadowing located within the left breast at the 1 o'clock axis, 3 cm from the nipple, measuring 1.9 x 1.1 x 1.2 cm, corresponding to an area of architectural distortion on mammogram. This is a suspicious finding for which ultrasound-guided biopsy is recommended.  RECOMMENDATION: Ultrasound-guided biopsy of the vague hypoechoic area with associated shadowing located within the left breast at the 1 o'clock axis, 3 cm from the nipple, measuring 1.9 x 1.1 x 1.2 cm, corresponding to  an area of architectural distortion on mammogram. This biopsy will be scheduled after a consultation with Dr. Bjorn Loser in regards to patient's current blood thinning medication regimen.  I have discussed the findings and recommendations with the patient. Results were also provided in writing at the conclusion of the visit. If applicable, a reminder letter will be sent to the patient regarding the next appointment.  BI-RADS CATEGORY  4: Suspicious.   Electronically Signed   By: Franki Cabot M.D.   On: 03/06/2015 13:00    Mm Diag Breast Tomo Uni Right  03/22/2015   CLINICAL DATA:  Patient is a 74 year old female  with recent diagnosis of left breast invasive lobular carcinoma on image guided biopsy performed 03/15/2015. She presents today for right diagnostic mammography prior to contrast-enhanced breast MR.  EXAM: DIGITAL DIAGNOSTIC RIGHT MAMMOGRAM WITH 3D TOMOSYNTHESIS AND CAD  COMPARISON:  Previous exam(s).  ACR Breast Density Category c: The breast tissue is heterogeneously dense, which may obscure small masses.  FINDINGS: Right unilateral digital diagnostic mammography demonstrates a heterogeneously dense parenchymal pattern which may obscure detection of small masses. Note is again made of scattered high-density round and rod-like calcifications with benign features. No new mass or suspicious microcalcification. There has been no significant interval change from comparison mammography.  Mammographic images were processed with CAD.  IMPRESSION: 1. No mammographic findings of malignancy in the right breast. 2. Patient has biopsy-proven invasive lobular carcinoma in the upper outer left breast. She is scheduled for contrast-enhanced bilateral breast MRI in the near future as well as the Multidisciplinary Clinic on 03/28/2015.  RECOMMENDATION: Continued surgical management of known malignancy in the left breast.  I have discussed the findings and recommendations with the patient. Results were also provided in  writing at the conclusion of the visit. If applicable, a reminder letter will be sent to the patient regarding the next appointment.  BI-RADS CATEGORY  2: Benign.   Electronically Signed   By: Andres Shad   On: 03/22/2015 14:46     ASSESSMENT & PLAN: 74 year old Caucasian female, postmenopausal, presented with screening detected left breast cancer.  1. Left breast invasive lobular carcinoma, cT2N0M0, stage IIA, ER/PR positive, HER-2 negative, Ki-67 5% - I reviewed her mammograms, ultrasound findings, and her core needle biopsy results.  -She has early-stage breast cancer, with strong ER/PR positivity and HER-2 negative, G1-2. -Her cancer is likely to be cured by complete surgical resection. She was seen by breast surgeon Dr. Brantley Stage today, likely will have lumpectomy and sentinel lymph node soon.  -We discussed the risk of cancer recurrence after breast surgery. Giving the T2 size, I recommend Oncotype DX test to further evaluated the risk of recurrence and benefit of chemotherapy. She is 62, has some medical comorbidities, but has relatively good performance status, would be a candidate for chemotherapy if she has high risk.  -Giving a strong ER/PR positivity, I recommend adjuvant hormonal therapy with aromatase inhibitor, which will start after she completes adjuvant radiation.  -She was seen by radiation oncologist Dr. Pablo Ledger, and adjuvant breast irradiation was offered.   2. Parkinson disease, hypertension -She'll continue follow-up with her primary care physician  Plan -She will likely have lumpectomy and sentinel lymph nodes soon - Oncotype DX test on surgical sample if SLN negative  -I'll see her 3 weeks after her surgery to review the Oncotype test result.    All questions were answered. The patient knows to call the clinic with any problems, questions or concerns. I spent 55 minutes counseling the patient face to face. The total time spent in the appointment was 60 minutes and  more than 50% was on counseling.     Truitt Merle, MD 03/28/2015 10:27 AM

## 2015-03-28 ENCOUNTER — Ambulatory Visit: Payer: Medicare Other | Attending: Surgery | Admitting: Physical Therapy

## 2015-03-28 ENCOUNTER — Ambulatory Visit: Payer: Self-pay | Admitting: Surgery

## 2015-03-28 ENCOUNTER — Ambulatory Visit
Admission: RE | Admit: 2015-03-28 | Discharge: 2015-03-28 | Disposition: A | Discharge status: Home / Self Care | Payer: Medicare Other | Source: Ambulatory Visit | Attending: Radiation Oncology | Admitting: Radiation Oncology

## 2015-03-28 ENCOUNTER — Ambulatory Visit (HOSPITAL_BASED_OUTPATIENT_CLINIC_OR_DEPARTMENT_OTHER): Payer: Medicare Other | Admitting: Hematology

## 2015-03-28 ENCOUNTER — Other Ambulatory Visit (HOSPITAL_BASED_OUTPATIENT_CLINIC_OR_DEPARTMENT_OTHER): Payer: Medicare Other

## 2015-03-28 ENCOUNTER — Encounter: Payer: Self-pay | Admitting: Hematology

## 2015-03-28 ENCOUNTER — Ambulatory Visit: Payer: Medicare Other

## 2015-03-28 ENCOUNTER — Encounter: Payer: Self-pay | Admitting: Skilled Nursing Facility1

## 2015-03-28 ENCOUNTER — Encounter: Payer: Self-pay | Admitting: Nurse Practitioner

## 2015-03-28 ENCOUNTER — Encounter: Payer: Self-pay | Admitting: Physical Therapy

## 2015-03-28 VITALS — BP 151/74 | HR 80 | Temp 99.1°F | Resp 18 | Ht 63.0 in | Wt 149.0 lb

## 2015-03-28 DIAGNOSIS — M25511 Pain in right shoulder: Secondary | ICD-10-CM | POA: Diagnosis not present

## 2015-03-28 DIAGNOSIS — M25611 Stiffness of right shoulder, not elsewhere classified: Secondary | ICD-10-CM | POA: Insufficient documentation

## 2015-03-28 DIAGNOSIS — Z170 Estrogen receptor positive status [ER+]: Secondary | ICD-10-CM | POA: Diagnosis not present

## 2015-03-28 DIAGNOSIS — C50412 Malignant neoplasm of upper-outer quadrant of left female breast: Secondary | ICD-10-CM

## 2015-03-28 DIAGNOSIS — R293 Abnormal posture: Secondary | ICD-10-CM

## 2015-03-28 DIAGNOSIS — G20 Parkinson's disease: Secondary | ICD-10-CM | POA: Diagnosis not present

## 2015-03-28 DIAGNOSIS — C50912 Malignant neoplasm of unspecified site of left female breast: Secondary | ICD-10-CM

## 2015-03-28 DIAGNOSIS — I10 Essential (primary) hypertension: Secondary | ICD-10-CM | POA: Diagnosis not present

## 2015-03-28 LAB — COMPREHENSIVE METABOLIC PANEL (CC13)
ALBUMIN: 4 g/dL (ref 3.5–5.0)
ALK PHOS: 64 U/L (ref 40–150)
ALT: 19 U/L (ref 0–55)
AST: 20 U/L (ref 5–34)
Anion Gap: 8 mEq/L (ref 3–11)
BUN: 15.1 mg/dL (ref 7.0–26.0)
CALCIUM: 10.2 mg/dL (ref 8.4–10.4)
CHLORIDE: 104 meq/L (ref 98–109)
CO2: 30 mEq/L — ABNORMAL HIGH (ref 22–29)
Creatinine: 1.1 mg/dL (ref 0.6–1.1)
EGFR: 53 mL/min/{1.73_m2} — AB (ref >90)
Glucose: 104 mg/dl (ref 70–140)
POTASSIUM: 4.3 meq/L (ref 3.5–5.1)
Sodium: 142 mEq/L (ref 136–145)
Total Bilirubin: 0.47 mg/dL (ref 0.20–1.20)
Total Protein: 7.1 g/dL (ref 6.4–8.3)

## 2015-03-28 LAB — CBC WITH DIFFERENTIAL/PLATELET
BASO%: 0.5 % (ref 0.0–2.0)
Basophils Absolute: 0 10*3/uL (ref 0.0–0.1)
EOS%: 4.3 % (ref 0.0–7.0)
Eosinophils Absolute: 0.2 10*3/uL (ref 0.0–0.5)
HCT: 43.1 % (ref 34.8–46.6)
HEMOGLOBIN: 14.4 g/dL (ref 11.6–15.9)
LYMPH%: 22 % (ref 14.0–49.7)
MCH: 30.4 pg (ref 25.1–34.0)
MCHC: 33.3 g/dL (ref 31.5–36.0)
MCV: 91.1 fL (ref 79.5–101.0)
MONO#: 0.8 10*3/uL (ref 0.1–0.9)
MONO%: 14.5 % — AB (ref 0.0–14.0)
NEUT%: 58.7 % (ref 38.4–76.8)
NEUTROS ABS: 3.3 10*3/uL (ref 1.5–6.5)
Platelets: 228 10*3/uL (ref 145–400)
RBC: 4.73 10*6/uL (ref 3.70–5.45)
RDW: 12.9 % (ref 11.2–14.5)
WBC: 5.6 10*3/uL (ref 3.9–10.3)
lymph#: 1.2 10*3/uL (ref 0.9–3.3)

## 2015-03-28 NOTE — Progress Notes (Signed)
Radiation Oncology         [id number]) [phone] ________________________________  Initial outpatient Consultation - Date: 03/28/2015   Name: Tracey Juarez MRN: [id number]   DOB: 02-04-1941  REFERRING PHYSICIAN: Erroll Luna, MD  DIAGNOSIS AND STAGE: Breast cancer of upper-outer quadrant of left female breast   Staging form: Breast, AJCC 7th Edition     Clinical stage from 03/19/2015: Stage IIA (T2, N0, M0) - Signed by Truitt Merle, MD on 03/27/2015    - T1cN0 invasive lobular carcinoma of left breast (03/28/15)  HISTORY OF PRESENT ILLNESS::Tracey Juarez is a 74 y.o. female who presented with a distortion in left upper outer quadrant. Ultrasound showed a 1.9 cm mass. Biopsy showed invasive lobular carcinoma this was ER, PR positive, HER-2 negative, Ki-67 5% She had an MRI on 03/22/15 which showed a 2.1 cm area of enhancement in the upper outer left breast. The right breast was normal and no suspicious lymph nodes were noted. She has recovered well from biopsy and presents for consideration of radiation treatment in the management of her disease. She is doing well after her biopsy. Accompanied by husband. She is interested in receiving radiation in [place], where her husband finished his prostate cancer treatment. She had questions whether to stop using estrogen cream.   Patient's menarche at age 56 She is post-menopausal.  She has used hormone placement for 10 years and discontinued use in 1993.  She is GxP0 She has not used birth control pills or hormone shots for contraception.   PREVIOUS RADIATION THERAPY: No  Past medical, social and family history were reviewed in the electronic chart. Review of symptoms was reviewed in the electronic chart. Medications were reviewed in the electronic chart.   PHYSICAL EXAM:  Vitals with Age-Percentiles 03/28/2015  Length 726 cm  Systolic 203  Diastolic 74  Pulse 80  Respiration 18  Weight 67.586 kg  BMI 26.4    Pleasant female appears younger than  stated age. She has biopsy change in the upper outer left breast with some bruising. Alert and oriented x3.   IMPRESSION: T2N0 Left Breast Invasive Lobular Carcinoma  PLAN:We discussed the elderly trials showing no survival benefit to radiation plus anti-estrogen therapy versus anti-estrogen alone.  She feels comfortable with her decision to proceed with anti estrogen therapy alone and will discuss that with Dr. Jana Hakim tomorrow. She will follow up with me on a prn basis.   I spoke to the patient today regarding her diagnosis and options for treatment. We discussed the equivalence in terms of survival and local failure between mastectomy and breast conservation. We discussed the role of radiation in decreasing local failures in patients who undergo lumpectomy. We discussed the low rates of failure in patients her age with these non aggressive breast cancers. We discussed the randomized trials in elderly women showing equivalent survival when omitting raidiaton and taking an AI instead. We discussed the randomized trials showing a slight improvement in local control with the combination.We discussed the differences between systemic and local treatment. She is unsure what she wants to do. She is going to meet Dr. Brantley Stage and Dr. Jana Hakim today and then after her surgery and she has some time to think about it. I am happy to meet with her again as well.   We discussed the process of simulation and the placement tattoos.  We discussed the possibility of asymptomatic lung damage. We discussed the low likelihood of secondary malignancies. We discussed the possible side effects including but not  limited to skin redness, fatigue, permanent skin darkening, and breast swelling.   She was evaluated by surgery, medical oncology, physical therapy and social workers today.     I spent 40 minutes  face to face with the patient and more than 50% of that time was spent in counseling and/or coordination of care.    ------------------------------------------------  Thea Silversmith, MD  This document serves as a record of services personally performed by Thea Silversmith, MD. It was created on her behalf by Derek Mound, a trained medical scribe. The creation of this record is based on the scribe's personal observations and the provider's statements to them. This document has been checked and approved by the attending provider.

## 2015-03-28 NOTE — Progress Notes (Signed)
Checked in new patient with 2 insurances. Pt may not need my assistance but card was given for any questions or concerns. Pt was given a packet.

## 2015-03-28 NOTE — Patient Instructions (Signed)

## 2015-03-28 NOTE — Progress Notes (Signed)
Tracey Juarez is a very pleasant 74 y.o. female from [place], [place] with newly diagnosed grade 1-2 invasive lobular carcinoma of the left breast.  Biopsy results revealed the tumor's prognostic profile is ER positive, PR positive, and HER2/neu negative. Ki67 is 5%.  She presents today with her husband to the [facility]) for treatment consideration and recommendations from the breast surgeon, radiation oncologist, and medical oncologist.     I briefly met with Tracey Juarez and her husband during her [facility] visit today. We discussed the purpose of the Survivorship Clinic, which will include monitoring for recurrence, coordinating completion of age and gender-appropriate cancer screenings, promotion of overall wellness, as well as managing potential late/long-term side effects of anti-cancer treatments.    The treatment plan for Tracey Juarez will likely include surgery, radiation therapy, and anti-estrogen therapy. As of today, the intent of treatment for Tracey Juarez is cure, therefore she will be eligible for the Survivorship Clinic upon her completion of treatment.  Her survivorship care plan (SCP) document will be drafted and updated throughout the course of her treatment trajectory. She will receive the SCP in an office visit with myself in the Survivorship Clinic once she has completed treatment.   Tracey Juarez was encouraged to ask questions and all questions were answered to her satisfaction.  She was given my business card and encouraged to contact me with any concerns regarding survivorship.  I look forward to participating in her care.   Kenn File, [place] [phone]

## 2015-03-28 NOTE — H&P (Signed)
Tracey Juarez 03/28/2015 8:31 AM Location: [facility] Patient #: [id number] DOB: 07-19-1941 Undefined / Language: Tracey Juarez / Race: White Female History of Present Illness Tracey Moores A. Vernadette Stutsman MD; 03/28/2015 10:03 AM) Patient words: Pt sent at the request of Tracey Juarez for left breast cancer diagnosed by mammogram. Pt denies history of breast mass pain or discharge. Mammogram/ MRI and U/S show a 2.1 cm LUOQ mass core bx ILC ER POS PR POS HER 2 NEU NEGATIVE.               Patient: Tracey Juarez Collected: 03/15/2015 Client: [facility] Accession: [id number] Received: 03/15/2015 Tracey Shad, MD DOB: Jan 05, 1941 Age: 47 Gender: F Reported: 03/19/2015 [place] Patient Ph: [phone] MRN #: [id number] [place],  [id number] Client Acc#: Chart #: [id number] Phone: [phone] Fax: CC: Tracey Gainer, MD CC: Tracey Herb, MD REPORT OF SURGICAL PATHOLOGY ADDITIONAL INFORMATION: FLUORESCENCE IN-SITU HYBRIDIZATION Results: HER2 - NEGATIVE RATIO OF HER2/CEP17 SIGNALS 1.21 AVERAGE HER2 COPY NUMBER PER CELL 2.35 Reference Range: NEGATIVE HER2/CEP17 Ratio <2.0 and average HER2 copy number <4.0 EQUIVOCAL HER2/CEP17 Ratio <2.0 and average HER2 copy number >=4.0 and <6.0 POSITIVE HER2/CEP17 Ratio >=2.0 or <2.0 and average HER2 copy number >=6.0 Tracey Laws MD Pathologist, Electronic Signature ( Signed 03/22/2015) PROGNOSTIC INDICATORS - ACIS Results: IMMUNOHISTOCHEMICAL AND MORPHOMETRIC ANALYSIS BY THE AUTOMATED CELLULAR IMAGING SYSTEM (ACIS) Estrogen Receptor: 100%, POSITIVE, STRONG STAINING INTENSITY (PERFORMED MANUALLY) Progesterone Receptor: 50%, POSITIVE, STRONG STAINING INTENSITY (PERFORMED MANUALLY) Proliferation Marker Ki67: 5% (PERFORMED MANUALLY) REFERENCE RANGE ESTROGEN RECEPTOR NEGATIVE <1% POSITIVE =>1% PROGESTERONE RECEPTOR NEGATIVE <1% POSITIVE =>1% 1 of 3 FINAL for Tracey Juarez ([id number]) ADDITIONAL  INFORMATION:(continued) All controls stained appropriately Tracey Laws MD Pathologist, Electronic Signature ( Signed 03/21/2015) FINAL DIAGNOSIS Diagnosis Breast, left, needle core biopsy, upper outer - INVASIVE LOBULAR CARCINOMA, SEE COMMENT. 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 - CALCIFICATIONS PRESENT. Microscopic Comment Although the grade of tumor is best assessed at resection, with these biopsies, both the in situ and invasive carcinoma are grade 1-2. There is partial to complete absence of E-cadherin immunostain expression within the invasive carcinoma and in situ carcinoma; supporting a lobular phenotype for each. Breast prognostic studies are pending and will be reported in an addendum. 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  LABS: None performed today.  EXAM: BILATERAL BREAST MRI WITH AND WITHOUT CONTRAST  TECHNIQUE: Multiplanar, multisequence MR images of both breasts were obtained prior to and following the intravenous administration of 13 ml of MultiHance.  THREE-DIMENSIONAL MR IMAGE RENDERING ON INDEPENDENT WORKSTATION:  Three-dimensional MR images were rendered by post-processing of the original MR data on an independent workstation. The three-dimensional MR images were interpreted, and findings are reported in the following complete MRI report for this study. Three dimensional images were evaluated at the independent DynaCad workstation  COMPARISON: Previous exam(s).  FINDINGS: Please note that there is mild motion artifact which slightly decreases sensitivity.  Breast composition: c. Heterogeneous fibroglandular tissue.  Background parenchymal enhancement: Moderate.  Right breast: No mass or abnormal enhancement.  Left breast: A 2.1 x  1.2 x 1.7 cm irregular area of enhancement within the upper outer left breast (middle third) is noted with internal biopsy clip artifact compatible with biopsy-proven neoplasm. Post biopsy changes laterally are noted.  No other definite suspicious enhancing  abnormalities are identified within the left breast.  Lymph nodes: No suspicious appearing lymph nodes are identified.  Ancillary findings: None.  IMPRESSION: 2.1 x 1.2 x 1.7 cm irregular enhancement within the upper outer left breast compatible with biopsy-proven malignancy. No other suspicious abnormalities identified although study is slightly limited by motion artifact.  No mammographic evidence of right breast malignancy.  RECOMMENDATION: Treatment plan  BI-RADS CATEGORY 6: Known biopsy-proven malignancy.   Electronically Signed By: Tracey Juarez M.D. On: 03/23/2015 12:30.  The patient is a 74 year old female   Other Problems Tracey Slipper, RN; 03/28/2015 8:31 AM) Arthritis Back Pain Breast Cancer Cerebrovascular Accident Gastroesophageal Reflux Disease General anesthesia - complications High blood pressure Hypercholesterolemia Lump In Breast Melanoma Migraine Headache Other disease, cancer, significant illness Ventral Hernia Repair  Past Surgical History Tracey Slipper, RN; 03/28/2015 8:31 AM) Breast Biopsy Left. Colon Polyp Removal - Colonoscopy Foot Surgery Left. Knee Surgery Left. Oral Surgery Tonsillectomy  Diagnostic Studies History Tracey Slipper, RN; 03/28/2015 8:31 AM) Colonoscopy 1-5 years ago Mammogram within last year Pap Smear 1-5 years ago  Medication History Tracey Slipper, RN; 03/28/2015 8:31 AM) No Current Medications Medications Reconciled  Social History Tracey Slipper, RN; 03/28/2015 8:31 AM) Caffeine use Coffee. No alcohol use No drug use Tobacco use Never smoker.  Family History Tracey Slipper, RN; 03/28/2015 8:31 AM) Arthritis Mother. Cerebrovascular  Accident Mother. Heart Disease Family Members In General. Hypertension Mother. Respiratory Condition Father.  Pregnancy / Birth History Tracey Slipper, RN; 03/28/2015 8:31 AM) Age at menarche 34 years. Age of menopause 68-55 Gravida 0 Irregular periods Para 0     Review of Systems Tracey Slipper RN; 03/28/2015 8:31 AM) General Present- Night Sweats. Not Present- Appetite Loss, Chills, Fatigue, Fever, Weight Gain and Weight Loss. Skin Present- Dryness. Not Present- Change in Wart/Mole, Hives, Jaundice, New Lesions, Non-Healing Wounds, Rash and Ulcer. HEENT Present- Earache, Ringing in the Ears and Wears glasses/contact lenses. Not Present- Hearing Loss, Hoarseness, Nose Bleed, Oral Ulcers, Seasonal Allergies, Sinus Pain, Sore Throat, Visual Disturbances and Yellow Eyes. Respiratory Present- Snoring. Not Present- Bloody sputum, Chronic Cough, Difficulty Breathing and Wheezing. Cardiovascular Present- Leg Cramps. Not Present- Chest Pain, Difficulty Breathing Lying Down, Palpitations, Rapid Heart Rate, Shortness of Breath and Swelling of Extremities. Gastrointestinal Present- Indigestion. Not Present- Abdominal Pain, Bloating, Bloody Stool, Change in Bowel Habits, Chronic diarrhea, Constipation, Difficulty Swallowing, Excessive gas, Gets full quickly at meals, Hemorrhoids, Nausea, Rectal Pain and Vomiting. Female Genitourinary Present- Urgency. Not Present- Frequency, Nocturia, Painful Urination and Pelvic Pain. Neurological Present- Headaches and Tremor. Not Present- Decreased Memory, Fainting, Numbness, Seizures, Tingling, Trouble walking and Weakness. Psychiatric Not Present- Anxiety, Bipolar, Change in Sleep Pattern, Depression, Fearful and Frequent crying. Endocrine Present- Hot flashes. Not Present- Cold Intolerance, Excessive Hunger, Hair Changes, Heat Intolerance and New Diabetes.   Physical Exam (Neeley Sedivy A. Gabby Rackers MD; 03/28/2015 10:04 AM)  General Mental Status-Alert. General  Appearance-Consistent with stated age. Hydration-Well hydrated. Voice-Normal.  Head and Neck Head-normocephalic, atraumatic with no lesions or palpable masses. Trachea-midline. Thyroid Gland Characteristics - normal size and consistency.  Eye Eyeball - Bilateral-Extraocular movements intact. Sclera/Conjunctiva - Bilateral-No scleral icterus.  Chest and Lung Exam Chest and lung exam reveals -quiet, even and easy respiratory effort with no use of accessory muscles and on auscultation, normal breath sounds, no adventitious sounds and normal vocal resonance. Inspection Chest Wall - Normal. Back - normal.  Breast Breast - Left-Symmetric, Non Tender, No Biopsy scars, no Dimpling, No Inflammation, No Lumpectomy scars, No Mastectomy scars, No  Peau d' Orange. Breast - Right-Symmetric, Non Tender, No Biopsy scars, no Dimpling, No Inflammation, No Lumpectomy scars, No Mastectomy scars, No Peau d' Orange. Breast Lump-No Palpable Breast Mass. Note: MILD BRUISING LEFT BREAST   Cardiovascular Cardiovascular examination reveals -normal heart sounds, regular rate and rhythm with no murmurs and normal pedal pulses bilaterally.  Abdomen - Did not examine.  Neurologic Neurologic evaluation reveals -alert and oriented x 3 with no impairment of recent or remote memory. Mental Status-Normal. Note: MILD COGWHEEL RIGIDITY   Musculoskeletal Normal Exam - Left-Upper Extremity Strength Normal and Lower Extremity Strength Normal. Normal Exam - Right-Upper Extremity Strength Normal and Lower Extremity Strength Normal.  Lymphatic Head & Neck  General Head & Neck Lymphatics: Bilateral - Description - Normal. Axillary  General Axillary Region: Bilateral - Description - Normal. Tenderness - Non Tender. Femoral & Inguinal - Did not examine.    Assessment & Plan (Liviana Mills A. Bodhi Moradi MD; 03/28/2015 10:08 AM)  BREAST CANCER, STAGE 2, LEFT (174.9  C50.912) Impression:  Discussed partial mastectomy vs mastectomy and reconstruction and SLN mapping. She would like breast conservation. Risk of partial mastectomy Risk of sentinel lymph node mapping include bleeding, infection, lymphedema, shoulder pain. stiffness, dye allergy. cosmetic deformity , blood clots, death, need for more surgery. Pt agres to proceed. include bleeding, infection, seroma, more surgery, use of seed/wire, wound care, cosmetic deformity and the need for other treatments, death , blood clots, death. Pt agrees to proceed.

## 2015-03-28 NOTE — Progress Notes (Signed)
Subjective:     Patient ID: Tracey Juarez, female   DOB: 1941-01-23, 74 y.o.   MRN: [id number]  HPI   Review of Systems     Objective:   Physical Exam For the patient to understand and be given the tools to implement a healthy plant based diet during their cancer diagnosis.     Assessment:     Patient was seen today and found to be pleasant and accompanied by her seemingly supportive husband. Pts ht 5'3'', wt 149 pounds, BMI 26.4. Pt does have parkinsons but she still goes to the gym 3 days a week and dances the other days of the week with her husband (quick step, 2 step, etc.). Pts relevant medications: lisinopril and simvastatin.  Pt states she loves fish and pinto beans.     Plan:     Dietitian educated the patient on implementing a plant based diet by incorporating more plant proteins, fruits, and vegetables. As a part of a healthy routine physical activity was discussed. Dietitian educated the pt on some of the diet recomendations that also overlap with benefits to parkinsons.  The importance of legitimate, evidence based information was discussed and examples were given. A folder of evidence based information with a focus on a plant based diet and general nutrition during cancer was given to the patient.  As a part of the continuum of care the cancer dietitian's contact information was given to the patient in the event they would like to have a follow up appointment.

## 2015-03-28 NOTE — Therapy (Signed)
[place], [place], [address] Phone: [phone]   Fax:  [phone]  Physical Therapy Evaluation  Patient Details  Name: Tracey Juarez MRN: [id number] Date of Birth: 25-Sep-1940 Referring Provider:  Erroll Luna, MD  Encounter Date: 03/28/2015      PT End of Session - 03/28/15 1002    Visit Number 1   Number of Visits 1   PT Start Time 0932   PT Stop Time 0955   PT Time Calculation (min) 23 min   Activity Tolerance Patient tolerated treatment well   Behavior During Therapy [facility] for tasks assessed/performed      Past Medical History  Diagnosis Date  . Atrophic vaginitis   . Detrusor instability   . Mixed basal-squamous cell carcinoma     skin  . Melanoma   . Elevated cholesterol   . Hypertension   . Parkinson disease   . Breast cancer of upper-outer quadrant of left female breast 03/20/2015  . Hot flashes     Past Surgical History  Procedure Laterality Date  . Tonsillectomy    . Foot surgery  2004  . Knee surgery  2007  . Skin cancer excised      There were no vitals filed for this visit.  Visit Diagnosis:  Shoulder stiffness, right - Plan: PT plan of care cert/re-cert  Right shoulder pain - Plan: PT plan of care cert/re-cert  Breast cancer, left - Plan: PT plan of care cert/re-cert  Abnormal posture - Plan: PT plan of care cert/re-cert      Subjective Assessment - 03/28/15 1003    Pertinent History Patient was diagnosed on 03/14/15 with left ER/PR positive, HER2 negative lobular breast cancer.  It has a low Ki67 and measures 1.9 cm in size.            [facility] PT Assessment - 03/28/15 0001    Assessment   Medical Diagnosis Left breast cancer   Onset Date/Surgical Date 03/14/15   Hand Dominance Right   Prior Therapy none   Precautions   Precautions Other (comment)  Active breast cancer; Parkinson's   Restrictions   Weight Bearing Restrictions No   Balance Screen   Has the patient fallen in the past 6 months No   Has the patient had a decrease in activity level because of a fear of falling?  No   Is the patient reluctant to leave their home because of a fear of falling?  No   Home Ecologist residence   Living Arrangements Spouse/significant other   Available Help at Discharge Family   Prior Function   Level of Independence Independent   Vocation Retired   Leisure Exercises [employer] 3x/wk doing 30 min cadio and 30 min weights; dances 2x/wk for 90 min   Cognition   Overall Cognitive Status Within Functional Limits for tasks assessed   Posture/Postural Control   Posture/Postural Control Postural limitations   Postural Limitations Rounded Shoulders;Forward head;Increased thoracic kyphosis   ROM / Strength   AROM / PROM / Strength AROM;Strength   AROM   AROM Assessment Site Shoulder   Right/Left Shoulder Right;Left   Right Shoulder Extension 36 Degrees   Right Shoulder Flexion 128 Degrees  with pain   Right Shoulder ABduction 121 Degrees  with tightness at end ROM   Right Shoulder Internal Rotation 67 Degrees   Right Shoulder External Rotation 63 Degrees   Left Shoulder Extension 46 Degrees  Left Shoulder Flexion 135 Degrees   Left Shoulder ABduction 146 Degrees   Left Shoulder Internal Rotation 75 Degrees   Left Shoulder External Rotation 63 Degrees   Strength   Overall Strength Within functional limits for tasks performed           LYMPHEDEMA/ONCOLOGY QUESTIONNAIRE - 03/28/15 1001    Type   Cancer Type Left breast cancer   Lymphedema Assessments   Lymphedema Assessments Upper extremities   Right Upper Extremity Lymphedema   10 cm Proximal to Olecranon Process 28 cm   Olecranon Process 24.6 cm   10 cm Proximal to Ulnar Styloid Process 21.4 cm   Just Proximal to Ulnar Styloid Process 14.7 cm   Across Hand at [employer] 18.5 cm   At [place] of 2nd Digit 5.8 cm   Left Upper Extremity  Lymphedema   10 cm Proximal to Olecranon Process 27.3 cm   Olecranon Process 24.3 cm   10 cm Proximal to Ulnar Styloid Process 21 cm   Just Proximal to Ulnar Styloid Process 14.6 cm   Across Hand at [employer] 18 cm   At [place] of 2nd Digit 5.7 cm      Patient was instructed today in a home exercise program today for post op shoulder range of motion. These included active assist shoulder flexion in sitting, scapular retraction, wall walking with shoulder abduction, and hands behind head external rotation.  She was encouraged to do these twice a day, holding 3 seconds and repeating 5 times when permitted by her physician.           PT Education - 03/28/15 1002    Education provided Yes   Education Details Post op shoulder ROM HEP and lymphedema risk reduction   Person(s) Educated Patient;Spouse   Methods Explanation;Demonstration;Handout   Comprehension Verbalized understanding;Returned demonstration              Breast Clinic Goals - 03/28/15 1034    Patient will be able to verbalize understanding of pertinent lymphedema risk reduction practices relevant to her diagnosis specifically related to skin care.   Time 1   Period Days   Status Achieved   Patient will be able to return demonstrate and/or verbalize understanding of the post-op home exercise program related to regaining shoulder range of motion.   Time 1   Period Days   Status Achieved   Patient will be able to verbalize understanding of the importance of attending the postoperative After Breast Cancer Class for further lymphedema risk reduction education and therapeutic exercise.   Time 1   Period Days   Status Achieved              Plan - 03/28/15 1003    Clinical Impression Statement Patient was diagnosed on 03/14/15 with left ER/PR positive, HER2 negative lobular breast cancer.  It has a low Ki67 and measures 1.9 cm in size.  She is planning to have a left lumpectomy with a sentinel node biopsy  followed by Oncotype testing with possible chemotherapy, radiaton and anti-estogen therapy.  She may benefit from post op PT to regain shoulder ROM and prevent lymphedema.   Pt will benefit from skilled therapeutic intervention in order to improve on the following deficits Decreased range of motion;Pain;Decreased strength;Decreased knowledge of precautions;Impaired UE functional use   Rehab Potential Excellent   Clinical Impairments Affecting Rehab Potential Parkinson's   PT Frequency One time visit   PT Treatment/Interventions Patient/family education;Therapeutic exercise   Consulted and Agree  with Plan of Care Patient;Family member/caregiver   Family Member Consulted Husband     Patient will follow up at outpatient cancer rehab if needed following surgery.  If the patient requires physical therapy at that time, a specific plan will be dictated and sent to the referring physician for approval. The patient was educated today on appropriate basic range of motion exercises to begin post operatively and the importance of attending the After Breast Cancer class following surgery.  Patient was educated today on lymphedema risk reduction practices as it pertains to recommendations that will benefit the patient immediately following surgery.  She verbalized good understanding.  No additional physical therapy is indicated at this time.         G-Codes - 04/25/15 1034    Functional Assessment Tool Used Clinical Judgement   Functional Limitation Self care   Self Care Current Status [phone]) At least 1 percent but less than 20 percent impaired, limited or restricted   Self Care Goal Status ([id number]) At least 1 percent but less than 20 percent impaired, limited or restricted   Self Care Discharge Status [phone]) At least 1 percent but less than 20 percent impaired, limited or restricted       Problem List Patient Active Problem List   Diagnosis Date Noted  . Breast cancer of upper-outer quadrant of left  female breast 03/20/2015  . Osteopenia 08/15/2013  . Parkinson disease   . Hypertension   . Atrophic vaginitis   . Detrusor instability   . Melanoma   . Mixed basal-squamous cell carcinoma     Annia Friendly, PT 2015-04-25 10:36 AM   [place], [place], [address] Phone: [phone]   Fax:  [phone]

## 2015-03-29 ENCOUNTER — Other Ambulatory Visit: Payer: Self-pay | Admitting: Surgery

## 2015-03-29 DIAGNOSIS — C50912 Malignant neoplasm of unspecified site of left female breast: Secondary | ICD-10-CM

## 2015-04-02 ENCOUNTER — Other Ambulatory Visit: Payer: Self-pay | Admitting: *Deleted

## 2015-04-02 ENCOUNTER — Telehealth: Payer: Self-pay | Admitting: Hematology

## 2015-04-02 ENCOUNTER — Telehealth: Payer: Self-pay | Admitting: *Deleted

## 2015-04-02 NOTE — Telephone Encounter (Signed)
Spoke with patient from [facility] 8/17.  She is doing well.  No questions or concerns at this time.  Encouraged her to call with any needs or concerns.

## 2015-04-02 NOTE — Telephone Encounter (Signed)
Mailed calendar for October

## 2015-04-12 HISTORY — PX: BREAST LUMPECTOMY: SHX2

## 2015-04-18 ENCOUNTER — Encounter (HOSPITAL_BASED_OUTPATIENT_CLINIC_OR_DEPARTMENT_OTHER): Payer: Self-pay | Admitting: *Deleted

## 2015-04-18 DIAGNOSIS — Z85828 Personal history of other malignant neoplasm of skin: Secondary | ICD-10-CM | POA: Diagnosis not present

## 2015-04-18 DIAGNOSIS — L570 Actinic keratosis: Secondary | ICD-10-CM | POA: Diagnosis not present

## 2015-04-18 NOTE — Progress Notes (Signed)
Patient is scheduled  for seed placement at [facility] on 04-20-15 at 1pm. She will come to [facility] for EKG before going there. Had labs 03-28-15 at [place].

## 2015-04-20 ENCOUNTER — Encounter (HOSPITAL_BASED_OUTPATIENT_CLINIC_OR_DEPARTMENT_OTHER)
Admission: RE | Admit: 2015-04-20 | Discharge: 2015-04-20 | Disposition: A | Discharge status: Home / Self Care | Payer: Medicare Other | Source: Ambulatory Visit | Attending: Surgery | Admitting: Surgery

## 2015-04-20 ENCOUNTER — Other Ambulatory Visit: Payer: Self-pay

## 2015-04-20 ENCOUNTER — Ambulatory Visit
Admission: RE | Admit: 2015-04-20 | Discharge: 2015-04-20 | Disposition: A | Discharge status: Home / Self Care | Payer: Medicare Other | Source: Ambulatory Visit | Attending: Surgery | Admitting: Surgery

## 2015-04-20 DIAGNOSIS — Z01818 Encounter for other preprocedural examination: Secondary | ICD-10-CM | POA: Insufficient documentation

## 2015-04-20 DIAGNOSIS — K219 Gastro-esophageal reflux disease without esophagitis: Secondary | ICD-10-CM | POA: Diagnosis not present

## 2015-04-20 DIAGNOSIS — E780 Pure hypercholesterolemia: Secondary | ICD-10-CM | POA: Diagnosis not present

## 2015-04-20 DIAGNOSIS — C50412 Malignant neoplasm of upper-outer quadrant of left female breast: Secondary | ICD-10-CM | POA: Diagnosis not present

## 2015-04-20 DIAGNOSIS — I10 Essential (primary) hypertension: Secondary | ICD-10-CM | POA: Diagnosis not present

## 2015-04-20 DIAGNOSIS — C50912 Malignant neoplasm of unspecified site of left female breast: Secondary | ICD-10-CM | POA: Diagnosis not present

## 2015-04-20 DIAGNOSIS — Z8673 Personal history of transient ischemic attack (TIA), and cerebral infarction without residual deficits: Secondary | ICD-10-CM | POA: Diagnosis not present

## 2015-04-25 ENCOUNTER — Ambulatory Visit (HOSPITAL_COMMUNITY)
Admission: RE | Admit: 2015-04-25 | Discharge: 2015-04-25 | Disposition: A | Discharge status: Home / Self Care | Payer: Medicare Other | Source: Ambulatory Visit | Attending: Surgery | Admitting: Surgery

## 2015-04-25 ENCOUNTER — Ambulatory Visit (HOSPITAL_BASED_OUTPATIENT_CLINIC_OR_DEPARTMENT_OTHER): Payer: Medicare Other | Admitting: Anesthesiology

## 2015-04-25 ENCOUNTER — Encounter (HOSPITAL_BASED_OUTPATIENT_CLINIC_OR_DEPARTMENT_OTHER)
Admission: RE | Disposition: A | Discharge status: Home / Self Care | Payer: Self-pay | Source: Ambulatory Visit | Attending: Surgery

## 2015-04-25 ENCOUNTER — Ambulatory Visit (HOSPITAL_BASED_OUTPATIENT_CLINIC_OR_DEPARTMENT_OTHER)
Admission: RE | Admit: 2015-04-25 | Discharge: 2015-04-25 | Disposition: A | Discharge status: Home / Self Care | Payer: Medicare Other | Source: Ambulatory Visit | Attending: Surgery | Admitting: Surgery

## 2015-04-25 ENCOUNTER — Ambulatory Visit
Admission: RE | Admit: 2015-04-25 | Discharge: 2015-04-25 | Disposition: A | Discharge status: Home / Self Care | Payer: Medicare Other | Source: Ambulatory Visit | Attending: Surgery | Admitting: Surgery

## 2015-04-25 ENCOUNTER — Encounter (HOSPITAL_BASED_OUTPATIENT_CLINIC_OR_DEPARTMENT_OTHER): Payer: Self-pay | Admitting: Anesthesiology

## 2015-04-25 DIAGNOSIS — C50912 Malignant neoplasm of unspecified site of left female breast: Secondary | ICD-10-CM

## 2015-04-25 DIAGNOSIS — C50412 Malignant neoplasm of upper-outer quadrant of left female breast: Secondary | ICD-10-CM | POA: Insufficient documentation

## 2015-04-25 DIAGNOSIS — K219 Gastro-esophageal reflux disease without esophagitis: Secondary | ICD-10-CM | POA: Diagnosis not present

## 2015-04-25 DIAGNOSIS — E780 Pure hypercholesterolemia: Secondary | ICD-10-CM | POA: Diagnosis not present

## 2015-04-25 DIAGNOSIS — Z8673 Personal history of transient ischemic attack (TIA), and cerebral infarction without residual deficits: Secondary | ICD-10-CM | POA: Insufficient documentation

## 2015-04-25 DIAGNOSIS — R079 Chest pain, unspecified: Secondary | ICD-10-CM | POA: Diagnosis not present

## 2015-04-25 DIAGNOSIS — R928 Other abnormal and inconclusive findings on diagnostic imaging of breast: Secondary | ICD-10-CM | POA: Diagnosis not present

## 2015-04-25 DIAGNOSIS — G8918 Other acute postprocedural pain: Secondary | ICD-10-CM | POA: Diagnosis not present

## 2015-04-25 DIAGNOSIS — I10 Essential (primary) hypertension: Secondary | ICD-10-CM | POA: Diagnosis not present

## 2015-04-25 HISTORY — DX: Cerebral infarction, unspecified: I63.9

## 2015-04-25 HISTORY — DX: Gastro-esophageal reflux disease without esophagitis: K21.9

## 2015-04-25 HISTORY — PX: BREAST LUMPECTOMY WITH RADIOACTIVE SEED AND SENTINEL LYMPH NODE BIOPSY: SHX6550

## 2015-04-25 SURGERY — BREAST LUMPECTOMY WITH RADIOACTIVE SEED AND SENTINEL LYMPH NODE BIOPSY
Anesthesia: Regional | Site: Breast | Laterality: Left

## 2015-04-25 MED ORDER — BUPIVACAINE-EPINEPHRINE (PF) 0.25% -1:200000 IJ SOLN
INTRAMUSCULAR | Status: AC
  Filled 2015-04-25: qty 60

## 2015-04-25 MED ORDER — SODIUM CHLORIDE 0.9 % IJ SOLN
INTRAMUSCULAR | Status: AC
  Filled 2015-04-25: qty 10

## 2015-04-25 MED ORDER — CHLORHEXIDINE GLUCONATE 4 % EX LIQD: 1.0000 "application " | Freq: Once | CUTANEOUS | Status: DC

## 2015-04-25 MED ORDER — PHENYLEPHRINE HCL 10 MG/ML IJ SOLN
INTRAMUSCULAR | Status: DC | PRN
  Administered 2015-04-25: 40 ug via INTRAVENOUS

## 2015-04-25 MED ORDER — GLYCOPYRROLATE 0.2 MG/ML IJ SOLN: 0.2000 mg | Freq: Once | INTRAMUSCULAR | Status: DC | PRN

## 2015-04-25 MED ORDER — FENTANYL CITRATE (PF) 100 MCG/2ML IJ SOLN
INTRAMUSCULAR | Status: DC | PRN
  Administered 2015-04-25: 25 ug via INTRAVENOUS
  Administered 2015-04-25: 50 ug via INTRAVENOUS
  Administered 2015-04-25: 25 ug via INTRAVENOUS

## 2015-04-25 MED ORDER — MIDAZOLAM HCL 2 MG/2ML IJ SOLN: 1.0000 mg | INTRAMUSCULAR | Status: DC | PRN

## 2015-04-25 MED ORDER — FENTANYL CITRATE (PF) 100 MCG/2ML IJ SOLN: 25.0000 ug | INTRAMUSCULAR | Status: DC | PRN

## 2015-04-25 MED ORDER — EPHEDRINE SULFATE 50 MG/ML IJ SOLN
INTRAMUSCULAR | Status: DC | PRN
  Administered 2015-04-25: 10 mg via INTRAVENOUS

## 2015-04-25 MED ORDER — BUPIVACAINE-EPINEPHRINE (PF) 0.25% -1:200000 IJ SOLN
INTRAMUSCULAR | Status: AC
  Filled 2015-04-25: qty 30

## 2015-04-25 MED ORDER — FENTANYL CITRATE (PF) 100 MCG/2ML IJ SOLN
50.0000 ug | INTRAMUSCULAR | Status: DC | PRN
  Administered 2015-04-25 (×2): 50 ug via INTRAVENOUS

## 2015-04-25 MED ORDER — LIDOCAINE HCL (CARDIAC) 20 MG/ML IV SOLN
INTRAVENOUS | Status: DC | PRN
  Administered 2015-04-25: 50 mg via INTRAVENOUS

## 2015-04-25 MED ORDER — BUPIVACAINE-EPINEPHRINE (PF) 0.25% -1:200000 IJ SOLN
INTRAMUSCULAR | Status: DC | PRN
  Administered 2015-04-25: 10 mL via PERINEURAL

## 2015-04-25 MED ORDER — BUPIVACAINE HCL (PF) 0.25 % IJ SOLN
INTRAMUSCULAR | Status: AC
  Filled 2015-04-25: qty 90

## 2015-04-25 MED ORDER — LACTATED RINGERS IV SOLN
INTRAVENOUS | Status: DC
  Administered 2015-04-25 (×2): via INTRAVENOUS

## 2015-04-25 MED ORDER — FENTANYL CITRATE (PF) 100 MCG/2ML IJ SOLN
INTRAMUSCULAR | Status: AC
  Filled 2015-04-25: qty 4

## 2015-04-25 MED ORDER — ONDANSETRON HCL 4 MG/2ML IJ SOLN: 4.0000 mg | Freq: Four times a day (QID) | INTRAMUSCULAR | Status: DC | PRN

## 2015-04-25 MED ORDER — DEXAMETHASONE SODIUM PHOSPHATE 4 MG/ML IJ SOLN
INTRAMUSCULAR | Status: DC | PRN
  Administered 2015-04-25: 10 mg via INTRAVENOUS

## 2015-04-25 MED ORDER — SCOPOLAMINE 1 MG/3DAYS TD PT72: 1.0000 | MEDICATED_PATCH | Freq: Once | TRANSDERMAL | Status: DC | PRN

## 2015-04-25 MED ORDER — METHYLENE BLUE 1 % INJ SOLN
INTRAMUSCULAR | Status: AC
  Filled 2015-04-25: qty 10

## 2015-04-25 MED ORDER — FENTANYL CITRATE (PF) 100 MCG/2ML IJ SOLN
INTRAMUSCULAR | Status: AC
  Filled 2015-04-25: qty 2

## 2015-04-25 MED ORDER — TECHNETIUM TC 99M SULFUR COLLOID FILTERED
1.0000 | Freq: Once | INTRAVENOUS | Status: AC | PRN
Start: 2015-04-25 — End: 2015-04-25
  Administered 2015-04-25: 1 via INTRADERMAL

## 2015-04-25 MED ORDER — OXYCODONE HCL 5 MG/5ML PO SOLN: 5.0000 mg | Freq: Once | ORAL | Status: DC | PRN

## 2015-04-25 MED ORDER — DEXTROSE 5 % IV SOLN
3.0000 g | INTRAVENOUS | Status: AC
  Administered 2015-04-25: 2 g via INTRAVENOUS

## 2015-04-25 MED ORDER — HYDROCODONE-ACETAMINOPHEN 5-325 MG PO TABS: 1.0000 | ORAL_TABLET | Freq: Four times a day (QID) | ORAL | Status: DC | PRN

## 2015-04-25 MED ORDER — BUPIVACAINE-EPINEPHRINE (PF) 0.5% -1:200000 IJ SOLN
INTRAMUSCULAR | Status: DC | PRN
  Administered 2015-04-25: 25 mL via PERINEURAL

## 2015-04-25 MED ORDER — PROPOFOL 10 MG/ML IV BOLUS
INTRAVENOUS | Status: AC
  Filled 2015-04-25: qty 20

## 2015-04-25 MED ORDER — MIDAZOLAM HCL 5 MG/5ML IJ SOLN: INTRAMUSCULAR | Status: DC | PRN

## 2015-04-25 MED ORDER — LIDOCAINE HCL (CARDIAC) 20 MG/ML IV SOLN
INTRAVENOUS | Status: AC
  Filled 2015-04-25: qty 5

## 2015-04-25 MED ORDER — PROPOFOL 10 MG/ML IV BOLUS
INTRAVENOUS | Status: DC | PRN
  Administered 2015-04-25: 100 mg via INTRAVENOUS

## 2015-04-25 MED ORDER — CEFAZOLIN SODIUM-DEXTROSE 2-3 GM-% IV SOLR
INTRAVENOUS | Status: AC
  Filled 2015-04-25: qty 50

## 2015-04-25 MED ORDER — MIDAZOLAM HCL 2 MG/2ML IJ SOLN
INTRAMUSCULAR | Status: AC
  Filled 2015-04-25: qty 2

## 2015-04-25 MED ORDER — OXYCODONE HCL 5 MG PO TABS: 5.0000 mg | ORAL_TABLET | Freq: Once | ORAL | Status: DC | PRN

## 2015-04-25 MED ORDER — ONDANSETRON HCL 4 MG/2ML IJ SOLN
INTRAMUSCULAR | Status: AC
  Filled 2015-04-25: qty 2

## 2015-04-25 SURGICAL SUPPLY — 45 items
APPLIER CLIP 9.375 MED OPEN (MISCELLANEOUS) ×2
BINDER BREAST LRG (GAUZE/BANDAGES/DRESSINGS) ×2 IMPLANT
BINDER BREAST MEDIUM (GAUZE/BANDAGES/DRESSINGS) IMPLANT
BINDER BREAST XLRG (GAUZE/BANDAGES/DRESSINGS) IMPLANT
BINDER BREAST XXLRG (GAUZE/BANDAGES/DRESSINGS) IMPLANT
BLADE SURG 15 STRL LF DISP TIS (BLADE) ×1 IMPLANT
BLADE SURG 15 STRL SS (BLADE) ×1
CANISTER SUCT 1200ML W/VALVE (MISCELLANEOUS) ×2 IMPLANT
CHLORAPREP W/TINT 26ML (MISCELLANEOUS) ×2 IMPLANT
CLIP APPLIE 9.375 MED OPEN (MISCELLANEOUS) ×1 IMPLANT
COVER BACK TABLE 60X90IN (DRAPES) ×2 IMPLANT
COVER MAYO STAND STRL (DRAPES) ×2 IMPLANT
COVER PROBE W GEL 5X96 (DRAPES) ×2 IMPLANT
DECANTER SPIKE VIAL GLASS SM (MISCELLANEOUS) IMPLANT
DRAPE LAPAROSCOPIC ABDOMINAL (DRAPES) ×2 IMPLANT
DRAPE UTILITY XL STRL (DRAPES) ×2 IMPLANT
ELECT COATED BLADE 2.86 ST (ELECTRODE) ×2 IMPLANT
ELECT REM PT RETURN 9FT ADLT (ELECTROSURGICAL) ×2
ELECTRODE REM PT RTRN 9FT ADLT (ELECTROSURGICAL) ×1 IMPLANT
GLOVE BIO SURGEON STRL SZ 6.5 (GLOVE) ×4 IMPLANT
GLOVE BIOGEL PI IND STRL 7.0 (GLOVE) ×1 IMPLANT
GLOVE BIOGEL PI IND STRL 8 (GLOVE) ×1 IMPLANT
GLOVE BIOGEL PI INDICATOR 7.0 (GLOVE) ×1
GLOVE BIOGEL PI INDICATOR 8 (GLOVE) ×1
GLOVE ECLIPSE 8.0 STRL XLNG CF (GLOVE) ×2 IMPLANT
GOWN STRL REUS W/ TWL LRG LVL3 (GOWN DISPOSABLE) ×2 IMPLANT
GOWN STRL REUS W/TWL LRG LVL3 (GOWN DISPOSABLE) ×2
HEMOSTAT SNOW SURGICEL 2X4 (HEMOSTASIS) ×6 IMPLANT
KIT MARKER MARGIN INK (KITS) ×2 IMPLANT
LIQUID BAND (GAUZE/BANDAGES/DRESSINGS) ×2 IMPLANT
NDL SAFETY ECLIPSE 18X1.5 (NEEDLE) IMPLANT
NEEDLE HYPO 18GX1.5 SHARP (NEEDLE)
NEEDLE HYPO 25X1 1.5 SAFETY (NEEDLE) ×2 IMPLANT
NS IRRIG 1000ML POUR BTL (IV SOLUTION) ×2 IMPLANT
PACK BASIN DAY SURGERY FS (CUSTOM PROCEDURE TRAY) ×2 IMPLANT
PENCIL BUTTON HOLSTER BLD 10FT (ELECTRODE) ×2 IMPLANT
SLEEVE SCD COMPRESS KNEE MED (MISCELLANEOUS) ×2 IMPLANT
SPONGE LAP 4X18 X RAY DECT (DISPOSABLE) ×2 IMPLANT
SUT MNCRL AB 4-0 PS2 18 (SUTURE) ×2 IMPLANT
SUT VICRYL 3-0 CR8 SH (SUTURE) ×2 IMPLANT
SYR CONTROL 10ML LL (SYRINGE) ×2 IMPLANT
TOWEL OR 17X24 6PK STRL BLUE (TOWEL DISPOSABLE) ×2 IMPLANT
TOWEL OR NON WOVEN STRL DISP B (DISPOSABLE) ×2 IMPLANT
TUBE CONNECTING 20X1/4 (TUBING) ×2 IMPLANT
YANKAUER SUCT BULB TIP NO VENT (SUCTIONS) ×2 IMPLANT

## 2015-04-25 NOTE — Transfer of Care (Signed)
Immediate Anesthesia Transfer of Care Note  Patient: Tracey Juarez  Procedure(s) Performed: Procedure(s): LEFT BREAST PARTIAL MASTECTOMY WITH RADIOACTIVE SEED AND SENTINEL LYMPH NODE MAPPING (Left)  Patient Location: PACU  Anesthesia Type:GA combined with regional for post-op pain  Level of Consciousness: sedated  Airway & Oxygen Therapy: Patient Spontanous Breathing and Patient connected to face mask oxygen  Post-op Assessment: Report given to RN and Post -op Vital signs reviewed and stable  Post vital signs: Reviewed and stable  Last Vitals:  Filed Vitals:   04/25/15 1133  BP:   Pulse: 80  Temp:   Resp: 31    Complications: No apparent anesthesia complications

## 2015-04-25 NOTE — H&P (View-Only) (Signed)
Tracey Juarez 03/28/2015 8:31 AM Location: [facility] Patient #: [id number] DOB: 07-19-1941 Undefined / Language: Tracey Juarez / Race: White Female History of Present Illness Tracey Juarez A. Tracey Lein MD; 03/28/2015 10:03 AM) Patient words: Pt sent at the request of Redge Gainer for left breast cancer diagnosed by mammogram. Pt denies history of breast mass pain or discharge. Mammogram/ MRI and U/S show a 2.1 cm LUOQ mass core bx ILC ER POS PR POS HER 2 NEU NEGATIVE.               Patient: Tracey Juarez, Tracey Juarez Collected: 03/15/2015 Client: [facility] Accession: [id number] Received: 03/15/2015 Tracey Shad, MD DOB: Jan 05, 1941 Age: 47 Gender: F Reported: 03/19/2015 [place] Patient Ph: [phone] MRN #: [id number] Tracey Juarez [id number] Client Acc#: Chart #: [id number] Phone: [phone] Fax: CC: Redge Gainer, MD CC: Chipper Herb, MD REPORT OF SURGICAL PATHOLOGY ADDITIONAL INFORMATION: FLUORESCENCE IN-SITU HYBRIDIZATION Results: HER2 - NEGATIVE RATIO OF HER2/CEP17 SIGNALS 1.21 AVERAGE HER2 COPY NUMBER PER CELL 2.35 Reference Range: NEGATIVE HER2/CEP17 Ratio <2.0 and average HER2 copy number <4.0 EQUIVOCAL HER2/CEP17 Ratio <2.0 and average HER2 copy number >=4.0 and <6.0 POSITIVE HER2/CEP17 Ratio >=2.0 or <2.0 and average HER2 copy number >=6.0 Tracey Laws MD Pathologist, Electronic Signature ( Signed 03/22/2015) PROGNOSTIC INDICATORS - ACIS Results: IMMUNOHISTOCHEMICAL AND MORPHOMETRIC ANALYSIS BY THE AUTOMATED CELLULAR IMAGING SYSTEM (ACIS) Estrogen Receptor: 100%, POSITIVE, STRONG STAINING INTENSITY (PERFORMED MANUALLY) Progesterone Receptor: 50%, POSITIVE, STRONG STAINING INTENSITY (PERFORMED MANUALLY) Proliferation Marker Ki67: 5% (PERFORMED MANUALLY) REFERENCE RANGE ESTROGEN RECEPTOR NEGATIVE <1% POSITIVE =>1% PROGESTERONE RECEPTOR NEGATIVE <1% POSITIVE =>1% 1 of 3 FINAL for Tracey Juarez ([id number]) ADDITIONAL  INFORMATION:(continued) All controls stained appropriately Tracey Laws MD Pathologist, Electronic Signature ( Signed 03/21/2015) FINAL DIAGNOSIS Diagnosis Breast, left, needle core biopsy, upper outer - INVASIVE LOBULAR CARCINOMA, SEE COMMENT. 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 - CALCIFICATIONS PRESENT. Microscopic Comment Although the grade of tumor is best assessed at resection, with these biopsies, both the in situ and invasive carcinoma are grade 1-2. There is partial to complete absence of E-cadherin immunostain expression within the invasive carcinoma and in situ carcinoma; supporting a lobular phenotype for each. Breast prognostic studies are pending and will be reported in an addendum. 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  LABS: None performed today.  EXAM: BILATERAL BREAST MRI WITH AND WITHOUT CONTRAST  TECHNIQUE: Multiplanar, multisequence MR images of both breasts were obtained prior to and following the intravenous administration of 13 ml of MultiHance.  THREE-DIMENSIONAL MR IMAGE RENDERING ON INDEPENDENT WORKSTATION:  Three-dimensional MR images were rendered by post-processing of the original MR data on an independent workstation. The three-dimensional MR images were interpreted, and findings are reported in the following complete MRI report for this study. Three dimensional images were evaluated at the independent DynaCad workstation  COMPARISON: Previous exam(s).  FINDINGS: Please note that there is mild motion artifact which slightly decreases sensitivity.  Breast composition: c. Heterogeneous fibroglandular tissue.  Background parenchymal enhancement: Moderate.  Right breast: No mass or abnormal enhancement.  Left breast: A 2.1 x  1.2 x 1.7 cm irregular area of enhancement within the upper outer left breast (middle third) is noted with internal biopsy clip artifact compatible with biopsy-proven neoplasm. Post biopsy changes laterally are noted.  No other definite suspicious enhancing  abnormalities are identified within the left breast.  Lymph nodes: No suspicious appearing lymph nodes are identified.  Ancillary findings: None.  IMPRESSION: 2.1 x 1.2 x 1.7 cm irregular enhancement within the upper outer left breast compatible with biopsy-proven malignancy. No other suspicious abnormalities identified although study is slightly limited by motion artifact.  No mammographic evidence of right breast malignancy.  RECOMMENDATION: Treatment plan  BI-RADS CATEGORY 6: Known biopsy-proven malignancy.   Electronically Signed By: Tracey Juarez M.D. On: 03/23/2015 12:30.  The patient is a 74 year old female   Other Problems Tracey Slipper, RN; 03/28/2015 8:31 AM) Arthritis Back Pain Breast Cancer Cerebrovascular Accident Gastroesophageal Reflux Disease General anesthesia - complications High blood pressure Hypercholesterolemia Lump In Breast Melanoma Migraine Headache Other disease, cancer, significant illness Ventral Hernia Repair  Past Surgical History Tracey Slipper, RN; 03/28/2015 8:31 AM) Breast Biopsy Left. Colon Polyp Removal - Colonoscopy Foot Surgery Left. Knee Surgery Left. Oral Surgery Tonsillectomy  Diagnostic Studies History Tracey Slipper, RN; 03/28/2015 8:31 AM) Colonoscopy 1-5 years ago Mammogram within last year Pap Smear 1-5 years ago  Medication History Tracey Slipper, RN; 03/28/2015 8:31 AM) No Current Medications Medications Reconciled  Social History Tracey Slipper, RN; 03/28/2015 8:31 AM) Caffeine use Coffee. No alcohol use No drug use Tobacco use Never smoker.  Family History Tracey Slipper, RN; 03/28/2015 8:31 AM) Arthritis Mother. Cerebrovascular  Accident Mother. Heart Disease Family Members In General. Hypertension Mother. Respiratory Condition Father.  Pregnancy / Birth History Tracey Slipper, RN; 03/28/2015 8:31 AM) Age at menarche 34 years. Age of menopause 68-55 Gravida 0 Irregular periods Para 0     Review of Systems Tracey Slipper RN; 03/28/2015 8:31 AM) General Present- Night Sweats. Not Present- Appetite Loss, Chills, Fatigue, Fever, Weight Gain and Weight Loss. Skin Present- Dryness. Not Present- Change in Wart/Mole, Hives, Jaundice, New Lesions, Non-Healing Wounds, Rash and Ulcer. HEENT Present- Earache, Ringing in the Ears and Wears glasses/contact lenses. Not Present- Hearing Loss, Hoarseness, Nose Bleed, Oral Ulcers, Seasonal Allergies, Sinus Pain, Sore Throat, Visual Disturbances and Yellow Eyes. Respiratory Present- Snoring. Not Present- Bloody sputum, Chronic Cough, Difficulty Breathing and Wheezing. Cardiovascular Present- Leg Cramps. Not Present- Chest Pain, Difficulty Breathing Lying Down, Palpitations, Rapid Heart Rate, Shortness of Breath and Swelling of Extremities. Gastrointestinal Present- Indigestion. Not Present- Abdominal Pain, Bloating, Bloody Stool, Change in Bowel Habits, Chronic diarrhea, Constipation, Difficulty Swallowing, Excessive gas, Gets full quickly at meals, Hemorrhoids, Nausea, Rectal Pain and Vomiting. Female Genitourinary Present- Urgency. Not Present- Frequency, Nocturia, Painful Urination and Pelvic Pain. Neurological Present- Headaches and Tremor. Not Present- Decreased Memory, Fainting, Numbness, Seizures, Tingling, Trouble walking and Weakness. Psychiatric Not Present- Anxiety, Bipolar, Change in Sleep Pattern, Depression, Fearful and Frequent crying. Endocrine Present- Hot flashes. Not Present- Cold Intolerance, Excessive Hunger, Hair Changes, Heat Intolerance and New Diabetes.   Physical Exam (Hershell Brandl A. Lella Mullany MD; 03/28/2015 10:04 AM)  General Mental Status-Alert. General  Appearance-Consistent with stated age. Hydration-Well hydrated. Voice-Normal.  Head and Neck Head-normocephalic, atraumatic with no lesions or palpable masses. Trachea-midline. Thyroid Gland Characteristics - normal size and consistency.  Eye Eyeball - Bilateral-Extraocular movements intact. Sclera/Conjunctiva - Bilateral-No scleral icterus.  Chest and Lung Exam Chest and lung exam reveals -quiet, even and easy respiratory effort with no use of accessory muscles and on auscultation, normal breath sounds, no adventitious sounds and normal vocal resonance. Inspection Chest Wall - Normal. Back - normal.  Breast Breast - Left-Symmetric, Non Tender, No Biopsy scars, no Dimpling, No Inflammation, No Lumpectomy scars, No Mastectomy scars, No  Peau d' Orange. Breast - Right-Symmetric, Non Tender, No Biopsy scars, no Dimpling, No Inflammation, No Lumpectomy scars, No Mastectomy scars, No Peau d' Orange. Breast Lump-No Palpable Breast Mass. Note: MILD BRUISING LEFT BREAST   Cardiovascular Cardiovascular examination reveals -normal heart sounds, regular rate and rhythm with no murmurs and normal pedal pulses bilaterally.  Abdomen - Did not examine.  Neurologic Neurologic evaluation reveals -alert and oriented x 3 with no impairment of recent or remote memory. Mental Status-Normal. Note: MILD COGWHEEL RIGIDITY   Musculoskeletal Normal Exam - Left-Upper Extremity Strength Normal and Lower Extremity Strength Normal. Normal Exam - Right-Upper Extremity Strength Normal and Lower Extremity Strength Normal.  Lymphatic Head & Neck  General Head & Neck Lymphatics: Bilateral - Description - Normal. Axillary  General Axillary Region: Bilateral - Description - Normal. Tenderness - Non Tender. Femoral & Inguinal - Did not examine.    Assessment & Plan (Rashmi Tallent A. Larene Ascencio MD; 03/28/2015 10:08 AM)  BREAST CANCER, STAGE 2, LEFT (174.9  C50.912) Impression:  Discussed partial mastectomy vs mastectomy and reconstruction and SLN mapping. She would like breast conservation. Risk of partial mastectomy Risk of sentinel lymph node mapping include bleeding, infection, lymphedema, shoulder pain. stiffness, dye allergy. cosmetic deformity , blood clots, death, need for more surgery. Pt agres to proceed. include bleeding, infection, seroma, more surgery, use of seed/wire, wound care, cosmetic deformity and the need for other treatments, death , blood clots, death. Pt agrees to proceed.

## 2015-04-25 NOTE — Discharge Instructions (Signed)
[place] Office Phone Number [phone]  BREAST BIOPSY/ PARTIAL MASTECTOMY: POST OP INSTRUCTIONS  Always review your discharge instruction sheet given to you by the facility where your surgery was performed.  IF YOU HAVE DISABILITY OR FAMILY LEAVE FORMS, YOU MUST BRING THEM TO THE OFFICE FOR PROCESSING.  DO NOT GIVE THEM TO YOUR DOCTOR.  1. A prescription for pain medication may be given to you upon discharge.  Take your pain medication as prescribed, if needed.  If narcotic pain medicine is not needed, then you may take acetaminophen (Tylenol) or ibuprofen (Advil) as needed. 2. Take your usually prescribed medications unless otherwise directed 3. If you need a refill on your pain medication, please contact your pharmacy.  They will contact our office to request authorization.  Prescriptions will not be filled after 5pm or on week-ends. 4. You should eat very light the first 24 hours after surgery, such as soup, crackers, pudding, etc.  Resume your normal diet the day after surgery. 5. Most patients will experience some swelling and bruising in the breast.  Ice packs and a good support bra will help.  Swelling and bruising can take several days to resolve.  6. It is common to experience some constipation if taking pain medication after surgery.  Increasing fluid intake and taking a stool softener will usually help or prevent this problem from occurring.  A mild laxative (Milk of Magnesia or Miralax) should be taken according to package directions if there are no bowel movements after 48 hours. 7. Unless discharge instructions indicate otherwise, you may remove your bandages 24-48 hours after surgery, and you may shower at that time.  You may have steri-strips (small skin tapes) in place directly over the incision.  These strips should be left on the skin for 7-10 days.  If your surgeon used skin glue on the incision, you may shower in 24 hours.  The glue will flake off over the  next 2-3 weeks.  Any sutures or staples will be removed at the office during your follow-up visit. 8. ACTIVITIES:  You may resume regular daily activities (gradually increasing) beginning the next day.  Wearing a good support bra or sports bra minimizes pain and swelling.  You may have sexual intercourse when it is comfortable. a. You may drive when you no longer are taking prescription pain medication, you can comfortably wear a seatbelt, and you can safely maneuver your car and apply brakes. b. RETURN TO WORK:  ______________________________________________________________________________________ 9. You should see your doctor in the office for a follow-up appointment approximately two weeks after your surgery.  Your doctors nurse will typically make your follow-up appointment when she calls you with your pathology report.  Expect your pathology report 2-3 business days after your surgery.  You may call to check if you do not hear from [place] after three days. 10. OTHER INSTRUCTIONS: _______________________________________________________________________________________________ _____________________________________________________________________________________________________________________________________ _____________________________________________________________________________________________________________________________________ _____________________________________________________________________________________________________________________________________  WHEN TO CALL YOUR DOCTOR: 1. Fever over 101.0 2. Nausea and/or vomiting. 3. Extreme swelling or bruising. 4. Continued bleeding from incision. 5. Increased pain, redness, or drainage from the incision.  The clinic staff is available to answer your questions during regular business hours.  Please dont hesitate to call and ask to speak to one of the nurses for clinical concerns.  If you have a medical emergency, go to the nearest  emergency room or call 911.  A surgeon from [facility] is always on call at the hospital.  For further questions, please visit [email or web address]  CALL OFFICE IN 48 HOUR TO SEE IF PATHOLOGY BACK YET     Post Anesthesia Home Care Instructions  Activity: Get plenty of rest for the remainder of the day. A responsible adult should stay with you for 24 hours following the procedure.  For the next 24 hours, DO NOT: -Drive a car -Paediatric nurse -Drink alcoholic beverages -Take any medication unless instructed by your physician -Make any legal decisions or sign important papers.  Meals: Start with liquid foods such as gelatin or soup. Progress to regular foods as tolerated. Avoid greasy, spicy, heavy foods. If nausea and/or vomiting occur, drink only clear liquids until the nausea and/or vomiting subsides. Call your physician if vomiting continues.  Special Instructions/Symptoms: Your throat may feel dry or sore from the anesthesia or the breathing tube placed in your throat during surgery. If this causes discomfort, gargle with warm salt water. The discomfort should disappear within 24 hours.  If you had a scopolamine patch placed behind your ear for the management of post- operative nausea and/or vomiting:  1. The medication in the patch is effective for 72 hours, after which it should be removed.  Wrap patch in a tissue and discard in the trash. Wash hands thoroughly with soap and water. 2. You may remove the patch earlier than 72 hours if you experience unpleasant side effects which may include dry mouth, dizziness or visual disturbances. 3. Avoid touching the patch. Wash your hands with soap and water after contact with the patch.

## 2015-04-25 NOTE — Anesthesia Postprocedure Evaluation (Signed)
Anesthesia Post Note  Patient: Tracey Juarez  Procedure(s) Performed: Procedure(s) (LRB): LEFT BREAST PARTIAL MASTECTOMY WITH RADIOACTIVE SEED AND SENTINEL LYMPH NODE MAPPING (Left)  Anesthesia type: General  Patient location: PACU  Post pain: Pain level controlled and Adequate analgesia  Post assessment: Post-op Vital signs reviewed, Patient's Cardiovascular Status Stable, Respiratory Function Stable, Patent Airway and Pain level controlled  Last Vitals:  Filed Vitals:   04/25/15 1145  BP: 122/57  Pulse: 83  Temp:   Resp: 17    Post vital signs: Reviewed and stable  Level of consciousness: awake, alert  and oriented  Complications: No apparent anesthesia complications

## 2015-04-25 NOTE — Op Note (Signed)
Preoperative diagnosis: Left breast cancer stage I upper outer quadrant  Postoperative diagnosis: Same  Procedure: Left breast seed localized partial mastectomy with left axillary sentinel lymph node mapping  Surgeon: Thomas Cornett M.D.  Anesthesia: LMA with pectoral block and 0.25% Sensorcaine local with epinephrine  EBL: Minimal  Specimen: Left breast mass with seating clip in specimen verified by radiograph and 2 left axillary sentinel nodes  Drains: None  EBL: Minimal  Description of procedure: The patient is a 74-year-old female with stage I left breast cancer. He was seen and evaluated and breast conservation versus mastectomy and reconstruction was discussed with the patient. She was felt to be a good breast conservation candidate and that is what she wished to do.The procedure has been discussed with the patient. Alternatives to surgery have been discussed with the patient.  Risks of surgery include bleeding,  Infection,  Seroma formation, death,  and the need for further surgery.   The patient understands and wishes to proceed.Sentinel lymph node mapping and dissection has been discussed with the patient.  Risk of bleeding,  Infection,  Seroma formation, lymphedema   Additional procedures,,  Shoulder weakness ,  Shoulder stiffness,  Nerve and blood vessel injury and reaction to the mapping dyes have been discussed.  Alternatives to surgery have been discussed with the patient.  The patient agrees to proceed.  Description of procedure: The patient was met in the holding area. Neoprobe was used to verify proper proceed location. The left breast was marked as the correct side. She underwent injection of technetium sulfa colloid per radiology. All questions are answered. She was taken back to the operating room after placement of a pectoral block by anesthesia and placed supine on the OR table. After induction of LMA anesthesia, left breast was prepped and draped in a sterile fashion and  timeout was done. Neoprobe was used and the scene was localized in the left breast upper outer quadrant. Curvilinear incision was made after infiltration of skin with 0.25% Sensorcaine local with epinephrine. All tissue around the scene clip were excised with grossly negative margins. Neoprobe was used to verify the see was the specimen. Radiograph showed the seating clip to be in the specimen. The cavity was made hemostatic with cautery. The same incision, I was able to dissect up into the left axilla. The neoprobe settings were changed technetium in 2 hot sentinel nodes removed. These are sent to pathology for permanent examination. There is no clinically enlarged lymph nodes. Hemostasis achieved. Long thoracic nerve, thoracodorsal trunk axillary vein were preserved. Surgicel snow placed into the axilla and this was closed with 3-0 Vicryl. The breast was mobilized the chest wall this was used to help close lumpectomy cavity after placement of clips. Surgicel snow was placed in the lumpectomy bed as well. The breast was closed in layers the deep layer 3-0 Vicryl and 4-0 Monocryl the skin. Liquid bandage applied. All final counts found to be correct. Binder placed. The patient was then extubated taken to recovery in satisfactory condition. 

## 2015-04-25 NOTE — Progress Notes (Signed)
Assisted Dr. Marcie Bal with left, ultrasound guided, pectoralis block. Side rails up, monitors on throughout procedure. See vital signs in flow sheet. Tolerated Procedure well.

## 2015-04-25 NOTE — Progress Notes (Signed)
Emotional support during breast injections °

## 2015-04-25 NOTE — Interval H&P Note (Signed)
History and Physical Interval Note:  04/25/2015 10:02 AM  Tracey Juarez  has presented today for surgery, with the diagnosis of Left Breast Cancer  The various methods of treatment have been discussed with the patient and family. After consideration of risks, benefits and other options for treatment, the patient has consented to  Procedure(s): LEFT BREAST PARTIAL MASTECTOMY WITH RADIOACTIVE SEED AND SENTINEL [place] (Left) as a surgical intervention .  The patient's history has been reviewed, patient examined, no change in status, stable for surgery.  I have reviewed the patient's chart and labs.  Questions were answered to the patient's satisfaction.     [name] A.

## 2015-04-25 NOTE — Anesthesia Procedure Notes (Addendum)
Anesthesia Regional Block:  Pectoralis block  Pre-Anesthetic Checklist: ,, timeout performed, Correct Patient, Correct Site, Correct Laterality, Correct Procedure, Correct Position, site marked, Risks and benefits discussed,  Surgical consent,  Pre-op evaluation,  At surgeon's request and post-op pain management  Laterality: Left  Prep: chloraprep       Needles:  Injection technique: Single-shot  Needle Type: Echogenic Needle     Needle Length: 9cm 9 cm Needle Gauge: 21 and 21 G    Additional Needles:  Procedures: ultrasound guided (picture in chart) Pectoralis block Narrative:  Start time: 04/25/2015 9:30 AM End time: 04/25/2015 9:41 AM Injection made incrementally with aspirations every 5 mL.  Performed by: Personally  Anesthesiologist: HODIERNE, ADAM  Additional Notes: Pt tolerated the procedure well.   Procedure Name: LMA Insertion Date/Time: 04/25/2015 10:38 AM Performed by: Toula Moos L Pre-anesthesia Checklist: Patient identified, Emergency Drugs available, Suction available, Patient being monitored and Timeout performed Patient Re-evaluated:Patient Re-evaluated prior to inductionOxygen Delivery Method: Circle System Utilized Preoxygenation: Pre-oxygenation with 100% oxygen Intubation Type: IV induction Ventilation: Mask ventilation without difficulty LMA: LMA inserted LMA Size: 4.0 Number of attempts: 1 Airway Equipment and Method: Bite block Placement Confirmation: positive ETCO2 Tube secured with: Tape Dental Injury: Teeth and Oropharynx as per pre-operative assessment

## 2015-04-25 NOTE — Anesthesia Preprocedure Evaluation (Signed)
Anesthesia Evaluation  Patient identified by MRN, date of birth, ID band Patient awake    Reviewed: Allergy & Precautions, NPO status , Patient's Chart, lab work & pertinent test results  Airway Mallampati: II   Neck ROM: full    Dental   Pulmonary neg pulmonary ROS,    breath sounds clear to auscultation       Cardiovascular hypertension,  Rhythm:regular Rate:Normal     Neuro/Psych CVA    GI/Hepatic GERD  ,  Endo/Other    Renal/GU      Musculoskeletal   Abdominal   Peds  Hematology   Anesthesia Other Findings   Reproductive/Obstetrics                             Anesthesia Physical Anesthesia Plan  ASA: III  Anesthesia Plan: General and Regional   Post-op Pain Management: MAC Combined w/ Regional for Post-op pain   Induction: Intravenous  Airway Management Planned: LMA  Additional Equipment:   Intra-op Plan:   Post-operative Plan:   Informed Consent: I have reviewed the patients History and Physical, chart, labs and discussed the procedure including the risks, benefits and alternatives for the proposed anesthesia with the patient or authorized representative who has indicated his/her understanding and acceptance.     Plan Discussed with: CRNA, Anesthesiologist and Surgeon  Anesthesia Plan Comments:         Anesthesia Quick Evaluation

## 2015-04-27 NOTE — Addendum Note (Signed)
Addendum  created 04/27/15 9688 by Ernesta Amble Blocker, CRNA   Modules edited: Charges VN

## 2015-04-30 ENCOUNTER — Telehealth: Payer: Self-pay | Admitting: *Deleted

## 2015-04-30 NOTE — Telephone Encounter (Signed)
Received order per Dr. Burr Medico for oncotype testing. Requisition sent to pathology. Received by Alyse Low.

## 2015-05-08 DIAGNOSIS — C50412 Malignant neoplasm of upper-outer quadrant of left female breast: Secondary | ICD-10-CM | POA: Diagnosis not present

## 2015-05-09 ENCOUNTER — Encounter (HOSPITAL_COMMUNITY): Payer: Self-pay

## 2015-05-11 ENCOUNTER — Encounter: Payer: Self-pay | Admitting: *Deleted

## 2015-05-11 NOTE — Progress Notes (Signed)
Received oncotype score of 15.  I have left message with patient's husband for a return phone call.

## 2015-05-15 ENCOUNTER — Encounter: Payer: Self-pay | Admitting: Hematology

## 2015-05-15 ENCOUNTER — Ambulatory Visit (HOSPITAL_BASED_OUTPATIENT_CLINIC_OR_DEPARTMENT_OTHER): Payer: Medicare Other | Admitting: Hematology

## 2015-05-15 ENCOUNTER — Telehealth: Payer: Self-pay | Admitting: Hematology

## 2015-05-15 ENCOUNTER — Telehealth: Payer: Self-pay | Admitting: Family Medicine

## 2015-05-15 VITALS — BP 136/75 | HR 78 | Temp 98.4°F | Resp 18 | Ht 63.0 in | Wt 155.7 lb

## 2015-05-15 DIAGNOSIS — I10 Essential (primary) hypertension: Secondary | ICD-10-CM

## 2015-05-15 DIAGNOSIS — G20 Parkinson's disease: Secondary | ICD-10-CM

## 2015-05-15 DIAGNOSIS — Z170 Estrogen receptor positive status [ER+]: Secondary | ICD-10-CM | POA: Diagnosis not present

## 2015-05-15 DIAGNOSIS — C50412 Malignant neoplasm of upper-outer quadrant of left female breast: Secondary | ICD-10-CM

## 2015-05-15 DIAGNOSIS — Z23 Encounter for immunization: Secondary | ICD-10-CM | POA: Diagnosis not present

## 2015-05-15 MED ORDER — ROSUVASTATIN CALCIUM 20 MG PO TABS: 20.0000 mg | ORAL_TABLET | Freq: Every day | ORAL | Status: DC

## 2015-05-15 MED ORDER — INFLUENZA VAC SPLIT QUAD 0.5 ML IM SUSY
0.5000 mL | PREFILLED_SYRINGE | Freq: Once | INTRAMUSCULAR | Status: AC
  Administered 2015-05-15: 0.5 mL via INTRAMUSCULAR
  Filled 2015-05-15: qty 0.5

## 2015-05-15 NOTE — Telephone Encounter (Signed)
Rx for crestor sent to pharmacy.

## 2015-05-15 NOTE — Telephone Encounter (Signed)
Pt confirmed labs/ov per 10/04 POF, gave pt AVS and Calendar.... KJ °

## 2015-05-15 NOTE — Progress Notes (Signed)
[place]  [phone] [phone]  Clinic Follow Up Note   Patient Care Team: Chipper Herb, MD as PCP - General (Family Medicine) Anastasio Auerbach, MD as Consulting Physician (Gynecology) Freeman Caldron. Bjorn Loser, MD as Referring Physician (Urology) Erroll Luna, MD as Consulting Physician (General Surgery) Truitt Merle, MD as Consulting Physician (Hematology) Thea Silversmith, MD as Consulting Physician (Radiation Oncology) Mauro Kaufmann, RN as Registered Nurse Rockwell Germany, RN as Registered Nurse Sylvan Cheese, NP as Nurse Practitioner (Nurse Practitioner) 05/15/2015  CHIEF COMPLAINTS:  Follow Up left breast cancer  Oncology History   Breast cancer of upper-outer quadrant of left female breast [facility])   Staging form: Breast, AJCC 7th Edition     Clinical stage from 03/19/2015: Stage IIA (T2, N0, M0) - Signed by Truitt Merle, MD on 03/27/2015     Pathologic stage from 04/27/2015: Stage IA (T1c, N0, cM0) - Signed by Enid Cutter, MD on 05/01/2015       Staging comments: Staged on final surgical specimen by Dr. Donato Heinz.          Breast cancer of upper-outer quadrant of left female breast ([place])   03/06/2015 Mammogram Hypoechoic area in the left breast 1:00 position, 3 cm from the nipple, measuring 1.9 x 1.1 x 1.2 cm, corresponding to an area of architectural distortion on mammogram.   03/19/2015 Receptors her2 ER 100% positive, PR 50% positive, HER-2 negative, Ki-67 5%.   03/19/2015 Initial Biopsy Left breast core needle biopsy showed invasive lobular carcinoma, grade 1-2, lobular carcinoma in situ.    03/20/2015 Initial Diagnosis Breast cancer of upper-outer quadrant of left female breast   03/23/2015 Imaging 2.1 x 1.2 x 1.7 cm irregular enhancement within the upper outer left breast compatible with biopsy-proven malignancy. No other abnormalities.   04/25/2015 Surgery Left breast lumpectomy  and a sentinel lymph node biopsy, margins were negative.   04/25/2015 Pathology Results Left breast lumpectomy showed a grade 1 invasive lobular carcinoma, and lobular carcinoma in situ, 3 sentinel lymph nodes were negative, margins were negative, repeated HER-2 was negative. pT1cN0   04/25/2015 Oncotype testing Recurrence score 15, which predicts 10% 10 year risk of distant recurrence with tamoxifen alone.    HISTORY OF PRESENTING ILLNESS:  Tracey Juarez 74 y.o. female is here because of recently diagnosed left breast cancer. She presents to our multidisciplinary clinic with her husband.  This was discovered by screening mammogram, she did not feel the lump. She denies any skin or nipple change, no constitutional symptoms. The moment mammogram and ultrasound revealed a 1.9 cm mass at 1:00 position of left breast, core needle biopsy showed invasive lobular carcinoma. ER/PR stripe positive, HER-2 negative. She underwent breast MI after biopsy, which showed a 2.1 cm irregular enhancement mass in the upper outer left breast.   She has Parkinson disesease which diagnosed 5 years ago, mild, very physically active. She has no family history of breast or ovary cancer. She has no children.  INTERIM HISTORY Mrs. Fahey returns for follow-up. She underwent left lumpectomy and sentinel lymph node biopsy on 04/25/2015. She tolerated procedure very well, still has a moderate tenderness at the incision site, but is not taking any pain medication. She otherwise is doing very well, no other complaints.  MEDICAL HISTORY:  Past Medical History  Diagnosis Date  . Atrophic vaginitis   . Detrusor instability   . Mixed basal-squamous cell carcinoma     skin  . Melanoma ([place])   . Elevated  cholesterol   . Hypertension   . Parkinson disease ([facility])   . Breast cancer of upper-outer quadrant of left female breast ([facility]) 03/20/2015  . Hot flashes   . GERD (gastroesophageal reflux disease)   . Stroke ([facility])     on Plavix, no deficits    SURGICAL HISTORY: Past Surgical History   Procedure Laterality Date  . Tonsillectomy    . Foot surgery  2004  . Knee surgery  2007  . Skin cancer excised    . Breast lumpectomy with radioactive seed and sentinel lymph node biopsy Left 04/25/2015    Procedure: LEFT BREAST PARTIAL MASTECTOMY WITH RADIOACTIVE SEED AND SENTINEL LYMPH NODE MAPPING;  Surgeon: Thomas Cornett, MD;  Location: [facility];  Service: General;  Laterality: Left;   GYN HISTORY  Menarchal: 11 LMP: 51 Contraceptive: no  HRT: 10 years G0P0:    SOCIAL HISTORY: Social History   Social History  . Marital Status: Married    Spouse Name: N/A  . Number of Children: No   . Years of Education: N/A   Occupational History  . Retired    Social History Main Topics  . Smoking status: Never Smoker   . Smokeless tobacco: Never Used  . Alcohol Use: No  . Drug Use: No  . Sexual Activity: No   Other Topics Concern  . Not on file   Social History Narrative    FAMILY HISTORY: Family History  Problem Relation Age of Onset  . Hypertension Mother   . Heart attack Mother   . Heart attack Maternal Aunt   . Heart attack Maternal Uncle     ALLERGIES:  has No Known Allergies.  MEDICATIONS:  Current Outpatient Prescriptions  Medication Sig Dispense Refill  . AZILECT 1 MG TABS tablet Take 1 tablet by mouth daily.    . Cholecalciferol (VITAMIN D) 2000 UNITS CAPS Take 1 capsule by mouth daily.    . clopidogrel (PLAVIX) 75 MG tablet Take 75 mg by mouth daily.    . diphenhydrAMINE (BENADRYL) 25 mg capsule Take 25 mg by mouth every 6 (six) hours as needed (for parkinson's symptoms).     . lisinopril (PRINIVIL,ZESTRIL) 10 MG tablet TAKE ONE TABLET BY MOUTH ONCE DAILY 90 tablet 4  . Magnesium 250 MG TABS Take 1 tablet by mouth daily.    . meclizine (ANTIVERT) 25 MG tablet TAKE TWO TABLETS BY MOUTH THREE TIMES DAILY AS NEEDED 60 tablet 2  . Multiple Vitamin (MULTIVITAMIN) capsule Take 1 capsule by mouth daily.      . oxybutynin (DITROPAN) 5 MG  tablet Take 1 tablet (5 mg total) by mouth 2 (two) times daily. 180 tablet 4  . ranitidine (ZANTAC) 150 MG tablet Take 150 mg by mouth 2 (two) times daily as needed.     . simvastatin (ZOCOR) 40 MG tablet Take 40 mg by mouth daily.    . HYDROcodone-acetaminophen (NORCO) 5-325 MG per tablet Take 1 tablet by mouth every 6 (six) hours as needed for moderate pain. (Patient not taking: Reported on 05/15/2015) 30 tablet 0   Current Facility-Administered Medications  Medication Dose Route Frequency Provider Last Rate Last Dose  . Influenza vac split quadrivalent PF (FLUARIX) injection 0.5 mL  0.5 mL Intramuscular Once Yan Feng, MD        REVIEW OF SYSTEMS:   Constitutional: Denies fevers, chills or abnormal night sweats Eyes: Denies blurriness of vision, double vision or watery eyes Ears, nose, mouth, throat, and face: Denies mucositis or sore   throat Respiratory: Denies cough, dyspnea or wheezes Cardiovascular: Denies palpitation, chest discomfort or lower extremity swelling Gastrointestinal:  Denies nausea, heartburn or change in bowel habits Skin: Denies abnormal skin rashes Lymphatics: Denies new lymphadenopathy or easy bruising Neurological:Denies numbness, tingling or new weaknesses Behavioral/Psych: Mood is stable, no new changes  All other systems were reviewed with the patient and are negative.  PHYSICAL EXAMINATION: ECOG PERFORMANCE STATUS: 0 - Asymptomatic  Filed Vitals:   05/15/15 0945  BP: 136/75  Pulse: 78  Temp: 98.4 F (36.9 C)  Resp: 18   Filed Weights   05/15/15 0945  Weight: 155 lb 11.2 oz (70.625 kg)    GENERAL:alert, no distress and comfortable SKIN: skin color, texture, turgor are normal, no rashes or significant lesions EYES: normal, conjunctiva are pink and non-injected, sclera clear OROPHARYNX:no exudate, no erythema and lips, buccal mucosa, and tongue normal  NECK: supple, thyroid normal size, non-tender, without nodularity LYMPH:  no palpable  lymphadenopathy in the cervical, axillary or inguinal LUNGS: clear to auscultation and percussion with normal breathing effort HEART: regular rate & rhythm and no murmurs and no lower extremity edema ABDOMEN:abdomen soft, non-tender and normal bowel sounds Musculoskeletal:no cyanosis of digits and no clubbing  PSYCH: alert & oriented x 3 with fluent speech NEURO: no focal motor/sensory deficits Breasts: Breast inspection showed them to be symmetrical with no nipple discharge. (+) Diffuse skin ecchymosis at left upper outer quadrant of breast, there is a 1.5 cm mass in the upper outer quadrant of left breast. Palpation of the right breast and axilla revealed no obvious mass that I could appreciate.   LABORATORY DATA:  I have reviewed the data as listed Lab Results  Component Value Date   WBC 5.6 03/28/2015   HGB 14.4 03/28/2015   HCT 43.1 03/28/2015   MCV 91.1 03/28/2015   PLT 228 03/28/2015    Recent Labs  08/17/14 0956 02/16/15 0947 03/28/15 0801  NA 143 140 142  K 4.5 4.3 4.3  CL 102 101  --   CO2 28 25 30*  GLUCOSE 92 97 104  BUN 12 14 15.1  CREATININE 0.87 0.92 1.1  CALCIUM 9.8 9.4 10.2  GFRNONAA 67 62  --   GFRAA 77 72  --   PROT 6.7 6.8 7.1  ALBUMIN  --   --  4.0  AST 23 20 20  ALT 16 15 19  ALKPHOS 75 60 64  BILITOT 0.4 0.6 0.47  BILIDIR 0.13 0.15  --    PATHOLOGY REPORT: Diagnosis 04/25/2015 1. Breast, lumpectomy, left - INVASIVE LOBULAR CARCINOMA, SEE COMMENT. 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 - PREVIOUS BIOPSY SITE. - SEE TUMOR SYNOPTIC TEMPLATE BELOW. 2. Lymph node, sentinel, biopsy, left - ONE LYMPH NODE, NEGATIVE FOR TUMOR (0/1) SEE COMMENT. 3. Lymph node, sentinel, biopsy, left - ONE LYMPH NODE, NEGATIVE FOR TUMOR (0/1) SEE COMMENT. 4. Lymph node, sentinel, biopsy, left - ONE LYMPH NODE, NEGATIVE FOR TUMOR (0/1) SEE COMMENT. Specimen, including laterality and lymph node sampling (sentinel,  non-sentinel): Left breat with sentinel lymph node sampling. Procedure: Lumpectomy. Histologic type: Lobular. Grade: 1 of 3 Tubule formation: 3 Nuclear pleomorphism: 1 Mitotic:1 Tumor size (gross measurement): 1.8 cm Margins: Invasive, distance to closest margin: 0.5 cm (anterior) In-situ, distance to closest margin: N/A If margin positive, focally or broadly: N/A Lymphovascular invasion: Absent. Ductal carcinoma in situ: Absent. Grade: Extensive. Extensive intraductal component: N/A Lobular neoplasia: Present. Tumor focality: Unifocal. Treatment effect: None. If present, treatment effect   in breast tissue, lymph nodes or both: N/A Extent of tumor: Skin: N/A Nipple: N/A Skeletal muscle: N/A Lymph nodes: Examined: 3 Sentinel 0 Non-sentinel 3 Total Lymph nodes with metastasis: 0 Isolated tumor cells (< 0.2 mm): N/A Micrometastasis: (> 0.2 mm and < 2.0 mm): N/A Macrometastasis: (> 2.0 mm): N/A Extracapsular extension: N/A Breast prognostic profile: Estrogen receptor: Not repeated, previous study demonstrated 100% positivity, [id number]. Progesterone receptor: Not repeated, previous study demonstrated 50% positivity, [id number]. Her 2 neu: Repeated, previous study demonstrated no amplification, [id number]. Ki-67: Not repeated, previous study demonstrated 5% proliferation rate. Non-neoplastic breast: Previous biopsy site, fibrocystic change, benign radial scar, calcifications. TNM: pT1c, pN0, pMX Comments: None. 2. - 4. There are no intranodal malignant epithelial tumor deposits identified on routine histology or with cytokeratin AE1/3 immunostains. (CRR:ecj 04/26/2015)  Results: HER2 - NEGATIVE RATIO OF HER2/CEP17 SIGNALS 1.69 AVERAGE HER2 COPY NUMBER PER CELL 2.45  Oncotype RS 15  RADIOGRAPHIC STUDIES: I have personally reviewed the radiological images as listed and agreed with the findings in the report.  Mr Breast Bilateral W Wo Contrast 03/23/2015   IMPRESSION:  2.1 x 1.2 x 1.7 cm irregular enhancement within the upper outer left breast compatible with biopsy-proven malignancy. No other suspicious abnormalities identified although study is slightly limited by motion artifact.  No mammographic evidence of right breast malignancy.  RECOMMENDATION: Treatment plan  BI-RADS CATEGORY  6: Known biopsy-proven malignancy.   Electronically Signed   By: Jeffrey  Hu M.D.   On: 03/23/2015 12:30   MM and Us Breast Ltd Uni Left Inc Axilla 03/06/2015    IMPRESSION: Vague hypoechoic area with associated shadowing located within the left breast at the 1 o'clock axis, 3 cm from the nipple, measuring 1.9 x 1.1 x 1.2 cm, corresponding to an area of architectural distortion on mammogram. This is a suspicious finding for which ultrasound-guided biopsy is recommended.  RECOMMENDATION: Ultrasound-guided biopsy of the vague hypoechoic area with associated shadowing located within the left breast at the 1 o'clock axis, 3 cm from the nipple, measuring 1.9 x 1.1 x 1.2 cm, corresponding to an area of architectural distortion on mammogram. This biopsy will be scheduled after a consultation with Dr. Meyer in regards to patient's current blood thinning medication regimen.  I have discussed the findings and recommendations with the patient. Results were also provided in writing at the conclusion of the visit. If applicable, a reminder letter will be sent to the patient regarding the next appointment.  BI-RADS CATEGORY  4: Suspicious.   Electronically Signed   By: Stan  Maynard M.D.   On: 03/06/2015 13:00    Mm Diag Breast Tomo Uni Right  03/22/2015   CLINICAL DATA:  Patient is a 73-year-old female with recent diagnosis of left breast invasive lobular carcinoma on image guided biopsy performed 03/15/2015. She presents today for right diagnostic mammography prior to contrast-enhanced breast MR.  EXAM: DIGITAL DIAGNOSTIC RIGHT MAMMOGRAM WITH 3D TOMOSYNTHESIS AND CAD  COMPARISON:  Previous exam(s).  ACR  Breast Density Category c: The breast tissue is heterogeneously dense, which may obscure small masses.  FINDINGS: Right unilateral digital diagnostic mammography demonstrates a heterogeneously dense parenchymal pattern which may obscure detection of small masses. Note is again made of scattered high-density round and rod-like calcifications with benign features. No new mass or suspicious microcalcification. There has been no significant interval change from comparison mammography.  Mammographic images were processed with CAD.  IMPRESSION: 1. No mammographic findings of malignancy in the right breast. 2. Patient has biopsy-proven invasive   lobular carcinoma in the upper outer left breast. She is scheduled for contrast-enhanced bilateral breast MRI in the near future as well as the Multidisciplinary Clinic on 03/28/2015.  RECOMMENDATION: Continued surgical management of known malignancy in the left breast.  I have discussed the findings and recommendations with the patient. Results were also provided in writing at the conclusion of the visit. If applicable, a reminder letter will be sent to the patient regarding the next appointment.  BI-RADS CATEGORY  2: Benign.   Electronically Signed   By: Andres Shad   On: 03/22/2015 14:46     ASSESSMENT & PLAN: 74 year old Caucasian female, postmenopausal, presented with screening detected left breast cancer.  1. Left breast invasive lobular carcinoma, pT1cN0M0, stage IA, ER/PR positive, HER-2 negative, Ki-67 5%, Oncotype low risk -I reviewed her surgical pathology findings with her and her husband in great detail. -She had a computed surgical resection, margins were negative, sentinel lymph nodes were negative, she has low-grade stage I breast cancer, likely cured by surgery alone. -We discussed the small risk of cancer recurrence after her surgery. I reviewed her Oncotype DX test results, the recurrence score is 15, which predicts 10% 10 year distant recurrence with  tamoxifen alone. -Giving a strong ER/PR positivity, I recommend adjuvant hormonal therapy with aromatase inhibitor, which will start after she completes adjuvant radiation. Due to side effects, which includes, but not limited to, hot flash, skin and vaginal dryness, muscular joint discomfort, slight increased risk of perivascular disease, osteoporosis, were reviewed with her in details. She agrees to proceed. -She was seen by radiation oncologist Dr. Pablo Ledger, and I will follow-up with her this week, likely proceed with adjuvant breast irradiation.  2. Parkinson disease, hypertension -She'll continue follow-up with her primary care physician  Plan -No adjuvant chemotherapy -She will proceed with breast irradiation soon -I'll see her back in 8 weeks to start her aromatase inhibitor, likely anastrozole. -For shot today.  All questions were answered. The patient knows to call the clinic with any problems, questions or concerns. I spent 25 minutes counseling the patient face to face. The total time spent in the appointment was 30 minutes and more than 50% was on counseling.     Truitt Merle, MD 05/15/2015 10:12 AM

## 2015-05-16 ENCOUNTER — Other Ambulatory Visit: Payer: Self-pay | Admitting: Family Medicine

## 2015-05-17 ENCOUNTER — Ambulatory Visit: Payer: Medicare Other

## 2015-05-17 ENCOUNTER — Ambulatory Visit
Admission: RE | Admit: 2015-05-17 | Discharge: 2015-05-17 | Disposition: A | Discharge status: Home / Self Care | Payer: Medicare Other | Source: Ambulatory Visit | Attending: Radiation Oncology | Admitting: Radiation Oncology

## 2015-05-17 ENCOUNTER — Ambulatory Visit: Admission: RE | Admit: 2015-05-17 | Payer: Medicare Other | Source: Ambulatory Visit | Admitting: Radiation Oncology

## 2015-05-21 ENCOUNTER — Telehealth: Payer: Self-pay | Admitting: *Deleted

## 2015-05-21 NOTE — Telephone Encounter (Signed)
Left message for a return phone call to get dates she is going to be getting XRT in [place].  Awaiting patient response.

## 2015-05-22 ENCOUNTER — Telehealth: Payer: Self-pay | Admitting: *Deleted

## 2015-05-22 NOTE — Telephone Encounter (Signed)
Spoke with patient. She will be getting her XRT in [place].  Informed her to let us know when her radiation start date and final date are.  Patient verbalized understanding.

## 2015-05-24 ENCOUNTER — Other Ambulatory Visit: Payer: Self-pay | Admitting: Family Medicine

## 2015-05-25 DIAGNOSIS — C50412 Malignant neoplasm of upper-outer quadrant of left female breast: Secondary | ICD-10-CM | POA: Diagnosis not present

## 2015-05-25 DIAGNOSIS — Z510 Encounter for antineoplastic radiation therapy: Secondary | ICD-10-CM | POA: Diagnosis not present

## 2015-05-25 DIAGNOSIS — Z170 Estrogen receptor positive status [ER+]: Secondary | ICD-10-CM | POA: Diagnosis not present

## 2015-06-01 DIAGNOSIS — Z170 Estrogen receptor positive status [ER+]: Secondary | ICD-10-CM | POA: Diagnosis not present

## 2015-06-01 DIAGNOSIS — Z510 Encounter for antineoplastic radiation therapy: Secondary | ICD-10-CM | POA: Diagnosis not present

## 2015-06-01 DIAGNOSIS — C50412 Malignant neoplasm of upper-outer quadrant of left female breast: Secondary | ICD-10-CM | POA: Diagnosis not present

## 2015-06-04 DIAGNOSIS — Z170 Estrogen receptor positive status [ER+]: Secondary | ICD-10-CM | POA: Diagnosis not present

## 2015-06-04 DIAGNOSIS — C50412 Malignant neoplasm of upper-outer quadrant of left female breast: Secondary | ICD-10-CM | POA: Diagnosis not present

## 2015-06-04 DIAGNOSIS — Z510 Encounter for antineoplastic radiation therapy: Secondary | ICD-10-CM | POA: Diagnosis not present

## 2015-06-05 DIAGNOSIS — G20 Parkinson's disease: Secondary | ICD-10-CM | POA: Diagnosis not present

## 2015-06-05 DIAGNOSIS — H8120 Vestibular neuronitis, unspecified ear: Secondary | ICD-10-CM | POA: Diagnosis not present

## 2015-06-05 DIAGNOSIS — R42 Dizziness and giddiness: Secondary | ICD-10-CM | POA: Diagnosis not present

## 2015-06-05 DIAGNOSIS — R251 Tremor, unspecified: Secondary | ICD-10-CM | POA: Diagnosis not present

## 2015-06-05 DIAGNOSIS — H9319 Tinnitus, unspecified ear: Secondary | ICD-10-CM | POA: Diagnosis not present

## 2015-06-06 DIAGNOSIS — C50412 Malignant neoplasm of upper-outer quadrant of left female breast: Secondary | ICD-10-CM | POA: Diagnosis not present

## 2015-06-06 DIAGNOSIS — Z170 Estrogen receptor positive status [ER+]: Secondary | ICD-10-CM | POA: Diagnosis not present

## 2015-06-06 DIAGNOSIS — Z510 Encounter for antineoplastic radiation therapy: Secondary | ICD-10-CM | POA: Diagnosis not present

## 2015-06-07 DIAGNOSIS — Z170 Estrogen receptor positive status [ER+]: Secondary | ICD-10-CM | POA: Diagnosis not present

## 2015-06-07 DIAGNOSIS — C50412 Malignant neoplasm of upper-outer quadrant of left female breast: Secondary | ICD-10-CM | POA: Diagnosis not present

## 2015-06-07 DIAGNOSIS — Z510 Encounter for antineoplastic radiation therapy: Secondary | ICD-10-CM | POA: Diagnosis not present

## 2015-06-08 ENCOUNTER — Encounter: Payer: Self-pay | Admitting: *Deleted

## 2015-06-08 DIAGNOSIS — Z510 Encounter for antineoplastic radiation therapy: Secondary | ICD-10-CM | POA: Diagnosis not present

## 2015-06-08 DIAGNOSIS — Z170 Estrogen receptor positive status [ER+]: Secondary | ICD-10-CM | POA: Diagnosis not present

## 2015-06-08 DIAGNOSIS — C50412 Malignant neoplasm of upper-outer quadrant of left female breast: Secondary | ICD-10-CM | POA: Diagnosis not present

## 2015-06-11 DIAGNOSIS — Z170 Estrogen receptor positive status [ER+]: Secondary | ICD-10-CM | POA: Diagnosis not present

## 2015-06-11 DIAGNOSIS — C50412 Malignant neoplasm of upper-outer quadrant of left female breast: Secondary | ICD-10-CM | POA: Diagnosis not present

## 2015-06-11 DIAGNOSIS — Z510 Encounter for antineoplastic radiation therapy: Secondary | ICD-10-CM | POA: Diagnosis not present

## 2015-06-12 DIAGNOSIS — Z510 Encounter for antineoplastic radiation therapy: Secondary | ICD-10-CM | POA: Diagnosis not present

## 2015-06-12 DIAGNOSIS — C50412 Malignant neoplasm of upper-outer quadrant of left female breast: Secondary | ICD-10-CM | POA: Diagnosis not present

## 2015-06-13 DIAGNOSIS — Z510 Encounter for antineoplastic radiation therapy: Secondary | ICD-10-CM | POA: Diagnosis not present

## 2015-06-13 DIAGNOSIS — C50412 Malignant neoplasm of upper-outer quadrant of left female breast: Secondary | ICD-10-CM | POA: Diagnosis not present

## 2015-06-14 DIAGNOSIS — Z510 Encounter for antineoplastic radiation therapy: Secondary | ICD-10-CM | POA: Diagnosis not present

## 2015-06-14 DIAGNOSIS — C50412 Malignant neoplasm of upper-outer quadrant of left female breast: Secondary | ICD-10-CM | POA: Diagnosis not present

## 2015-06-15 DIAGNOSIS — Z510 Encounter for antineoplastic radiation therapy: Secondary | ICD-10-CM | POA: Diagnosis not present

## 2015-06-15 DIAGNOSIS — C50412 Malignant neoplasm of upper-outer quadrant of left female breast: Secondary | ICD-10-CM | POA: Diagnosis not present

## 2015-06-18 DIAGNOSIS — C50412 Malignant neoplasm of upper-outer quadrant of left female breast: Secondary | ICD-10-CM | POA: Diagnosis not present

## 2015-06-18 DIAGNOSIS — Z510 Encounter for antineoplastic radiation therapy: Secondary | ICD-10-CM | POA: Diagnosis not present

## 2015-06-19 DIAGNOSIS — Z510 Encounter for antineoplastic radiation therapy: Secondary | ICD-10-CM | POA: Diagnosis not present

## 2015-06-19 DIAGNOSIS — C50412 Malignant neoplasm of upper-outer quadrant of left female breast: Secondary | ICD-10-CM | POA: Diagnosis not present

## 2015-06-20 DIAGNOSIS — C50412 Malignant neoplasm of upper-outer quadrant of left female breast: Secondary | ICD-10-CM | POA: Diagnosis not present

## 2015-06-20 DIAGNOSIS — Z510 Encounter for antineoplastic radiation therapy: Secondary | ICD-10-CM | POA: Diagnosis not present

## 2015-06-21 DIAGNOSIS — C50412 Malignant neoplasm of upper-outer quadrant of left female breast: Secondary | ICD-10-CM | POA: Diagnosis not present

## 2015-06-21 DIAGNOSIS — Z510 Encounter for antineoplastic radiation therapy: Secondary | ICD-10-CM | POA: Diagnosis not present

## 2015-06-22 DIAGNOSIS — C50412 Malignant neoplasm of upper-outer quadrant of left female breast: Secondary | ICD-10-CM | POA: Diagnosis not present

## 2015-06-22 DIAGNOSIS — Z510 Encounter for antineoplastic radiation therapy: Secondary | ICD-10-CM | POA: Diagnosis not present

## 2015-06-25 DIAGNOSIS — C50412 Malignant neoplasm of upper-outer quadrant of left female breast: Secondary | ICD-10-CM | POA: Diagnosis not present

## 2015-06-25 DIAGNOSIS — Z510 Encounter for antineoplastic radiation therapy: Secondary | ICD-10-CM | POA: Diagnosis not present

## 2015-06-26 DIAGNOSIS — C50412 Malignant neoplasm of upper-outer quadrant of left female breast: Secondary | ICD-10-CM | POA: Diagnosis not present

## 2015-06-26 DIAGNOSIS — Z510 Encounter for antineoplastic radiation therapy: Secondary | ICD-10-CM | POA: Diagnosis not present

## 2015-06-27 ENCOUNTER — Telehealth: Payer: Self-pay | Admitting: Nurse Practitioner

## 2015-06-27 ENCOUNTER — Telehealth: Payer: Self-pay | Admitting: *Deleted

## 2015-06-27 DIAGNOSIS — Z510 Encounter for antineoplastic radiation therapy: Secondary | ICD-10-CM | POA: Diagnosis not present

## 2015-06-27 DIAGNOSIS — C50412 Malignant neoplasm of upper-outer quadrant of left female breast: Secondary | ICD-10-CM | POA: Diagnosis not present

## 2015-06-27 NOTE — Telephone Encounter (Signed)
Left message for a return phone call to follow up after completion of XRT.  Referral placed for survivorship.

## 2015-06-27 NOTE — Telephone Encounter (Signed)
per pof to sch pt appt-Cld & spoke to pt in re to appt time & date for 2/17.Pt understood

## 2015-07-12 ENCOUNTER — Other Ambulatory Visit: Payer: Self-pay | Admitting: *Deleted

## 2015-07-12 DIAGNOSIS — C50412 Malignant neoplasm of upper-outer quadrant of left female breast: Secondary | ICD-10-CM

## 2015-07-12 DIAGNOSIS — C4499 Other specified malignant neoplasm of skin, unspecified: Secondary | ICD-10-CM

## 2015-07-16 ENCOUNTER — Ambulatory Visit (HOSPITAL_BASED_OUTPATIENT_CLINIC_OR_DEPARTMENT_OTHER): Payer: Medicare Other | Admitting: Hematology

## 2015-07-16 ENCOUNTER — Other Ambulatory Visit (HOSPITAL_BASED_OUTPATIENT_CLINIC_OR_DEPARTMENT_OTHER): Payer: Medicare Other

## 2015-07-16 ENCOUNTER — Encounter: Payer: Self-pay | Admitting: Hematology

## 2015-07-16 ENCOUNTER — Telehealth: Payer: Self-pay | Admitting: Hematology

## 2015-07-16 VITALS — BP 142/76 | HR 77 | Temp 98.3°F | Resp 18 | Ht 63.0 in | Wt 157.7 lb

## 2015-07-16 DIAGNOSIS — M8580 Other specified disorders of bone density and structure, unspecified site: Secondary | ICD-10-CM

## 2015-07-16 DIAGNOSIS — C4499 Other specified malignant neoplasm of skin, unspecified: Secondary | ICD-10-CM

## 2015-07-16 DIAGNOSIS — G20 Parkinson's disease: Secondary | ICD-10-CM | POA: Diagnosis not present

## 2015-07-16 DIAGNOSIS — C50412 Malignant neoplasm of upper-outer quadrant of left female breast: Secondary | ICD-10-CM | POA: Diagnosis not present

## 2015-07-16 DIAGNOSIS — Z170 Estrogen receptor positive status [ER+]: Secondary | ICD-10-CM | POA: Diagnosis not present

## 2015-07-16 DIAGNOSIS — I10 Essential (primary) hypertension: Secondary | ICD-10-CM

## 2015-07-16 LAB — COMPREHENSIVE METABOLIC PANEL
ALT: 12 U/L (ref 0–55)
ANION GAP: 7 meq/L (ref 3–11)
AST: 19 U/L (ref 5–34)
Albumin: 3.8 g/dL (ref 3.5–5.0)
Alkaline Phosphatase: 72 U/L (ref 40–150)
BILIRUBIN TOTAL: 0.5 mg/dL (ref 0.20–1.20)
BUN: 13.3 mg/dL (ref 7.0–26.0)
CALCIUM: 9.8 mg/dL (ref 8.4–10.4)
CO2: 28 mEq/L (ref 22–29)
CREATININE: 0.9 mg/dL (ref 0.6–1.1)
Chloride: 106 mEq/L (ref 98–109)
EGFR: 63 mL/min/{1.73_m2} — ABNORMAL LOW (ref >90)
Glucose: 95 mg/dl (ref 70–140)
Potassium: 4.2 mEq/L (ref 3.5–5.1)
Sodium: 142 mEq/L (ref 136–145)
TOTAL PROTEIN: 6.9 g/dL (ref 6.4–8.3)

## 2015-07-16 LAB — CBC WITH DIFFERENTIAL/PLATELET
BASO%: 0.8 % (ref 0.0–2.0)
Basophils Absolute: 0 10*3/uL (ref 0.0–0.1)
EOS ABS: 0.3 10*3/uL (ref 0.0–0.5)
EOS%: 4.7 % (ref 0.0–7.0)
HEMATOCRIT: 40 % (ref 34.8–46.6)
HGB: 13.1 g/dL (ref 11.6–15.9)
LYMPH#: 0.9 10*3/uL (ref 0.9–3.3)
LYMPH%: 15.9 % (ref 14.0–49.7)
MCH: 30.3 pg (ref 25.1–34.0)
MCHC: 32.8 g/dL (ref 31.5–36.0)
MCV: 92.3 fL (ref 79.5–101.0)
MONO#: 0.8 10*3/uL (ref 0.1–0.9)
MONO%: 13.7 % (ref 0.0–14.0)
NEUT%: 64.9 % (ref 38.4–76.8)
NEUTROS ABS: 3.6 10*3/uL (ref 1.5–6.5)
PLATELETS: 216 10*3/uL (ref 145–400)
RBC: 4.33 10*6/uL (ref 3.70–5.45)
RDW: 13.1 % (ref 11.2–14.5)
WBC: 5.6 10*3/uL (ref 3.9–10.3)

## 2015-07-16 MED ORDER — ANASTROZOLE 1 MG PO TABS: 1.0000 mg | ORAL_TABLET | Freq: Every day | ORAL | Status: DC

## 2015-07-16 NOTE — Progress Notes (Signed)
[place]  [phone] [phone]  Clinic Follow Up Note   Patient Care Team: Chipper Herb, MD as PCP - General (Family Medicine) Anastasio Auerbach, MD as Consulting Physician (Gynecology) Freeman Caldron. Bjorn Loser, MD as Referring Physician (Urology) Erroll Luna, MD as Consulting Physician (General Surgery) Truitt Merle, MD as Consulting Physician (Hematology) Thea Silversmith, MD as Consulting Physician (Radiation Oncology) Mauro Kaufmann, RN as Registered Nurse Rockwell Germany, RN as Registered Nurse Sylvan Cheese, NP as Nurse Practitioner (Nurse Practitioner) 07/16/2015  CHIEF COMPLAINTS:  Follow Up left breast cancer  Oncology History   Breast cancer of upper-outer quadrant of left female breast [facility])   Staging form: Breast, AJCC 7th Edition     Clinical stage from 03/19/2015: Stage IIA (T2, N0, M0) - Signed by Truitt Merle, MD on 03/27/2015     Pathologic stage from 04/27/2015: Stage IA (T1c, N0, cM0) - Signed by Enid Cutter, MD on 05/01/2015       Staging comments: Staged on final surgical specimen by Dr. Donato Heinz.          Breast cancer of upper-outer quadrant of left female breast ([place])   03/06/2015 Mammogram Hypoechoic area in the left breast 1:00 position, 3 cm from the nipple, measuring 1.9 x 1.1 x 1.2 cm, corresponding to an area of architectural distortion on mammogram.   03/19/2015 Receptors her2 ER 100% positive, PR 50% positive, HER-2 negative, Ki-67 5%.   03/19/2015 Initial Biopsy Left breast core needle biopsy showed invasive lobular carcinoma, grade 1-2, lobular carcinoma in situ.    03/20/2015 Initial Diagnosis Breast cancer of upper-outer quadrant of left female breast   03/23/2015 Imaging 2.1 x 1.2 x 1.7 cm irregular enhancement within the upper outer left breast compatible with biopsy-proven malignancy. No other abnormalities.   04/25/2015 Surgery Left breast lumpectomy  and a sentinel lymph node biopsy, margins were negative.   04/25/2015 Pathology Results Left breast lumpectomy showed a grade 1 invasive lobular carcinoma, and lobular carcinoma in situ, 3 sentinel lymph nodes were negative, margins were negative, repeated HER-2 was negative. pT1cN0   04/25/2015 Oncotype testing Recurrence score 15, which predicts 10% 10 year risk of distant recurrence with tamoxifen alone.    - 06/29/2015 Radiation Therapy Breast irradiation     HISTORY OF PRESENTING ILLNESS:  Tracey Juarez 74 y.o. female is here because of recently diagnosed left breast cancer. She presents to our multidisciplinary clinic with her husband.  This was discovered by screening mammogram, she did not feel the lump. She denies any skin or nipple change, no constitutional symptoms. The moment mammogram and ultrasound revealed a 1.9 cm mass at 1:00 position of left breast, core needle biopsy showed invasive lobular carcinoma. ER/PR stripe positive, HER-2 negative. She underwent breast MI after biopsy, which showed a 2.1 cm irregular enhancement mass in the upper outer left breast.   She has Parkinson disesease which diagnosed 5 years ago, mild, very physically active. She has no family history of breast or ovary cancer. She has no children.  INTERIM HISTORY Tracey Juarez returns for follow-up. She completed breast irradiation about 2 weeks ago. She tolerated well, with mild radiation dermatitis, no other significant side effects. Her Parkinson disease is stable, she has a mild difficulty to initiate steps or change position, but she tolerates activity and functions well overall.   MEDICAL HISTORY:  Past Medical History  Diagnosis Date  . Atrophic vaginitis   . Detrusor instability   . Mixed basal-squamous cell  carcinoma     skin  . Melanoma ([place])   . Elevated cholesterol   . Hypertension   . Parkinson disease ([place])   . Breast cancer of upper-outer quadrant of left female breast ([place]) 03/20/2015  . Hot flashes   . GERD (gastroesophageal reflux disease)   .  Stroke [facility])     on Plavix, no deficits    SURGICAL HISTORY: Past Surgical History  Procedure Laterality Date  . Tonsillectomy    . Foot surgery  2004  . Knee surgery  2007  . Skin cancer excised    . Breast lumpectomy with radioactive seed and sentinel lymph node biopsy Left 04/25/2015    Procedure: LEFT BREAST PARTIAL MASTECTOMY WITH RADIOACTIVE SEED AND SENTINEL LYMPH NODE MAPPING;  Surgeon: Erroll Luna, MD;  Location: [facility];  Service: General;  Laterality: Left;   GYN HISTORY  Menarchal: 11 LMP: 51 Contraceptive: no  HRT: 10 years G0P0:    SOCIAL HISTORY: Social History   Social History  . Marital Status: Married    Spouse Name: N/A  . Number of Children: No   . Years of Education: N/A   Occupational History  . Retired    Social History Main Topics  . Smoking status: Never Smoker   . Smokeless tobacco: Never Used  . Alcohol Use: No  . Drug Use: No  . Sexual Activity: No   Other Topics Concern  . Not on file   Social History Narrative    FAMILY HISTORY: Family History  Problem Relation Age of Onset  . Hypertension Mother   . Heart attack Mother   . Heart attack Maternal Aunt   . Heart attack Maternal Uncle     ALLERGIES:  has No Known Allergies.  MEDICATIONS:  Current Outpatient Prescriptions  Medication Sig Dispense Refill  . AZILECT 1 MG TABS tablet Take 1 tablet by mouth daily.    . Cholecalciferol (VITAMIN D) 2000 UNITS CAPS Take 1 capsule by mouth daily.    . clopidogrel (PLAVIX) 75 MG tablet Take 75 mg by mouth daily.    . diphenhydrAMINE (BENADRYL) 25 mg capsule Take 25 mg by mouth every 6 (six) hours as needed (for parkinson's symptoms).     Marland Kitchen lisinopril (PRINIVIL,ZESTRIL) 10 MG tablet TAKE ONE TABLET BY MOUTH ONCE DAILY 90 tablet 0  . Magnesium 250 MG TABS Take 1 tablet by mouth daily.    . meclizine (ANTIVERT) 25 MG tablet TAKE TWO TABLETS BY MOUTH THREE TIMES DAILY AS NEEDED 60 tablet 2  . Multiple Vitamin  (MULTIVITAMIN) capsule Take 1 capsule by mouth daily.      Marland Kitchen oxybutynin (DITROPAN) 5 MG tablet Take 1 tablet (5 mg total) by mouth 2 (two) times daily. 180 tablet 4  . ranitidine (ZANTAC) 150 MG tablet Take 150 mg by mouth 2 (two) times daily as needed.     . rosuvastatin (CRESTOR) 20 MG tablet Take 1 tablet (20 mg total) by mouth daily. 30 tablet 3  . anastrozole (ARIMIDEX) 1 MG tablet Take 1 tablet (1 mg total) by mouth daily. 30 tablet 1   No current facility-administered medications for this visit.    REVIEW OF SYSTEMS:   Constitutional: Denies fevers, chills or abnormal night sweats Eyes: Denies blurriness of vision, double vision or watery eyes Ears, nose, mouth, throat, and face: Denies mucositis or sore throat Respiratory: Denies cough, dyspnea or wheezes Cardiovascular: Denies palpitation, chest discomfort or lower extremity swelling Gastrointestinal:  Denies nausea, heartburn or change  in bowel habits Skin: Denies abnormal skin rashes Lymphatics: Denies new lymphadenopathy or easy bruising Neurological:Denies numbness, tingling or new weaknesses Behavioral/Psych: Mood is stable, no new changes  All other systems were reviewed with the patient and are negative.  PHYSICAL EXAMINATION: ECOG PERFORMANCE STATUS: 0 - Asymptomatic  Filed Vitals:   07/16/15 1244  BP: 142/76  Pulse: 77  Temp: 98.3 F (36.8 C)  Resp: 18   Filed Weights   07/16/15 1244  Weight: 157 lb 11.2 oz (71.532 kg)    GENERAL:alert, no distress and comfortable SKIN: skin color, texture, turgor are normal, no rashes or significant lesions EYES: normal, conjunctiva are pink and non-injected, sclera clear OROPHARYNX:no exudate, no erythema and lips, buccal mucosa, and tongue normal  NECK: supple, thyroid normal size, non-tender, without nodularity LYMPH:  no palpable lymphadenopathy in the cervical, axillary or inguinal LUNGS: clear to auscultation and percussion with normal breathing effort HEART:  regular rate & rhythm and no murmurs and no lower extremity edema ABDOMEN:abdomen soft, non-tender and normal bowel sounds Musculoskeletal:no cyanosis of digits and no clubbing  PSYCH: alert & oriented x 3 with fluent speech NEURO: no focal motor/sensory deficits Breasts: Breast inspection showed them to be symmetrical with no nipple discharge. (+) Diffuse skin erythema with scatter pressures at left breast, nontender, no palpable mass. Palpation of the right breast and axilla revealed no obvious mass that I could appreciate.   LABORATORY DATA:  I have reviewed the data as listed Lab Results  Component Value Date   WBC 5.6 07/16/2015   HGB 13.1 07/16/2015   HCT 40.0 07/16/2015   MCV 92.3 07/16/2015   PLT 216 07/16/2015    Recent Labs  08/17/14 0956 02/16/15 0947 03/28/15 0801 07/16/15 1228  NA 143 140 142 142  K 4.5 4.3 4.3 4.2  CL 102 101  --   --   CO2 28 25 30* 28  GLUCOSE 92 97 104 95  BUN 12 14 15.1 13.3  CREATININE 0.87 0.92 1.1 0.9  CALCIUM 9.8 9.4 10.2 9.8  GFRNONAA 67 62  --   --   GFRAA 77 72  --   --   PROT 6.7 6.8 7.1 6.9  ALBUMIN 4.4 4.5 4.0 3.8  AST _0 ALT _1 ALKPHOS 75 60 64 72  BILITOT 0.4 0.6 0.47 0.50  BILIDIR 0.13 0.15  --   --    PATHOLOGY REPORT: Diagnosis 04/25/2015 1. Breast, lumpectomy, left - INVASIVE LOBULAR CARCINOMA, SEE COMMENT. 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 - PREVIOUS BIOPSY SITE. - SEE TUMOR SYNOPTIC TEMPLATE BELOW. 2. Lymph node, sentinel, biopsy, left - ONE LYMPH NODE, NEGATIVE FOR TUMOR (0/1) SEE COMMENT. 3. Lymph node, sentinel, biopsy, left - ONE LYMPH NODE, NEGATIVE FOR TUMOR (0/1) SEE COMMENT. 4. Lymph node, sentinel, biopsy, left - ONE LYMPH NODE, NEGATIVE FOR TUMOR (0/1) SEE COMMENT. Specimen, including laterality and lymph node sampling (sentinel, non-sentinel): Left breat with sentinel lymph node sampling. Procedure: Lumpectomy. Histologic type:  Lobular. Grade: 1 of 3 Tubule formation: 3 Nuclear pleomorphism: 1 Mitotic:1 Tumor size (gross measurement): 1.8 cm Margins: Invasive, distance to closest margin: 0.5 cm (anterior) In-situ, distance to closest margin: N/A If margin positive, focally or broadly: N/A Lymphovascular invasion: Absent. Ductal carcinoma in situ: Absent. Grade: Extensive. Extensive intraductal component: N/A Lobular neoplasia: Present. Tumor focality: Unifocal. Treatment effect: None. If present, treatment effect in breast tissue, lymph nodes or both: N/A Extent of tumor:  Skin: N/A Nipple: N/A Skeletal muscle: N/A Lymph nodes: Examined: 3 Sentinel 0 Non-sentinel 3 Total Lymph nodes with metastasis: 0 Isolated tumor cells (< 0.2 mm): N/A Micrometastasis: (> 0.2 mm and < 2.0 mm): N/A Macrometastasis: (> 2.0 mm): N/A Extracapsular extension: N/A Breast prognostic profile: Estrogen receptor: Not repeated, previous study demonstrated 100% positivity, [id number]. Progesterone receptor: Not repeated, previous study demonstrated 50% positivity, [id number]. Her 2 neu: Repeated, previous study demonstrated no amplification, [id number]. Ki-67: Not repeated, previous study demonstrated 5% proliferation rate. Non-neoplastic breast: Previous biopsy site, fibrocystic change, benign radial scar, calcifications. TNM: pT1c, pN0, pMX Comments: None. 2. - 4. There are no intranodal malignant epithelial tumor deposits identified on routine histology or with cytokeratin AE1/3 immunostains. (CRR:ecj 04/26/2015)  Results: HER2 - NEGATIVE RATIO OF HER2/CEP17 SIGNALS 1.69 AVERAGE HER2 COPY NUMBER PER CELL 2.45  Oncotype RS 15  RADIOGRAPHIC STUDIES: I have personally reviewed the radiological images as listed and agreed with the findings in the report.  Mr Breast Bilateral W Wo Contrast 03/23/2015   IMPRESSION: 2.1 x 1.2 x 1.7 cm irregular enhancement within the upper outer left breast compatible with  biopsy-proven malignancy. No other suspicious abnormalities identified although study is slightly limited by motion artifact.  No mammographic evidence of right breast malignancy.  RECOMMENDATION: Treatment plan  BI-RADS CATEGORY  6: Known biopsy-proven malignancy.   Electronically Signed   By: Margarette Canada M.D.   On: 03/23/2015 12:30   MM and US Breast Ltd Uni Left Inc Axilla 03/06/2015    IMPRESSION: Vague hypoechoic area with associated shadowing located within the left breast at the 1 o'clock axis, 3 cm from the nipple, measuring 1.9 x 1.1 x 1.2 cm, corresponding to an area of architectural distortion on mammogram. This is a suspicious finding for which ultrasound-guided biopsy is recommended.  RECOMMENDATION: Ultrasound-guided biopsy of the vague hypoechoic area with associated shadowing located within the left breast at the 1 o'clock axis, 3 cm from the nipple, measuring 1.9 x 1.1 x 1.2 cm, corresponding to an area of architectural distortion on mammogram. This biopsy will be scheduled after a consultation with Dr. Bjorn Loser in regards to patient's current blood thinning medication regimen.  I have discussed the findings and recommendations with the patient. Results were also provided in writing at the conclusion of the visit. If applicable, a reminder letter will be sent to the patient regarding the next appointment.  BI-RADS CATEGORY  4: Suspicious.   Electronically Signed   By: Franki Cabot M.D.   On: 03/06/2015 13:00    Mm Diag Breast Tomo Uni Right  03/22/2015   CLINICAL DATA:  Patient is a 74 year old female with recent diagnosis of left breast invasive lobular carcinoma on image guided biopsy performed 03/15/2015. She presents today for right diagnostic mammography prior to contrast-enhanced breast MR.  EXAM: DIGITAL DIAGNOSTIC RIGHT MAMMOGRAM WITH 3D TOMOSYNTHESIS AND CAD  COMPARISON:  Previous exam(s).  ACR Breast Density Category c: The breast tissue is heterogeneously dense, which may obscure  small masses.  FINDINGS: Right unilateral digital diagnostic mammography demonstrates a heterogeneously dense parenchymal pattern which may obscure detection of small masses. Note is again made of scattered high-density round and rod-like calcifications with benign features. No new mass or suspicious microcalcification. There has been no significant interval change from comparison mammography.  Mammographic images were processed with CAD.  IMPRESSION: 1. No mammographic findings of malignancy in the right breast. 2. Patient has biopsy-proven invasive lobular carcinoma in the upper outer left breast. She is scheduled  for contrast-enhanced bilateral breast MRI in the near future as well as the Multidisciplinary Clinic on 03/28/2015.  RECOMMENDATION: Continued surgical management of known malignancy in the left breast.  I have discussed the findings and recommendations with the patient. Results were also provided in writing at the conclusion of the visit. If applicable, a reminder letter will be sent to the patient regarding the next appointment.  BI-RADS CATEGORY  2: Benign.   Electronically Signed   By: Andres Shad   On: 03/22/2015 14:46     ASSESSMENT & PLAN: 74 year old Caucasian female, postmenopausal, presented with screening detected left breast cancer.  1. Left breast invasive lobular carcinoma, pT1cN0M0, stage IA, ER/PR positive, HER-2 negative, Ki-67 5%, Oncotype low risk -I previously reviewed her surgical pathology findings with her and her husband in great detail. -She had a complete surgical resection, margins were negative, sentinel lymph nodes were negative, she has low-grade stage I breast cancer, likely cured by surgery alone. -We discussed the small risk of cancer recurrence after her surgery. I reviewed her Oncotype DX test results, the recurrence score is 15, which predicts 10% 10 year distant recurrence with tamoxifen alone. -Giving a strong ER/PR positivity, I recommend adjuvant  hormonal therapy with aromatase inhibitor, potential side effects, which includes, but not limited to, hot flash, skin and vaginal dryness, muscular joint discomfort, slight increased risk of perivascular disease, osteoporosis, were reviewed with her in details. She is certainly concerned if her Parkinson disease will get worse when on the medication, but she agrees to proceed. I sent a prescription of anastrozole to her pharmacy today. -If she does have significant muscular and joint discomfort from the anastrozole, I'll consider switching her to tamoxifen. Potential side effects of tamoxifen were reviewed with her also.  2. Osteopenia -She hada bone density scan done in April 2015, which showed osteopenia with T score of -1.9 - I encouraged her to continue vitamin D, and add on calcium  -We discussed that the national may potentially make her osteopenia worse.  -We'll follow-up her bone density scan every 2 years.  2. Parkinson disease, hypertension -She'll continue follow-up with her primary care physician  Plan -Start anastrozole after visit with Dr. Pablo Ledger on December 19 -Return in 6 weeks for follow-up and lab.  All questions were answered. The patient knows to call the clinic with any problems, questions or concerns.  I spent 25 minutes counseling the patient face to face. The total time spent in the appointment was 30 minutes and more than 50% was on counseling.     Truitt Merle, MD 07/16/2015 5:13 PM

## 2015-07-16 NOTE — Telephone Encounter (Signed)
Gave and printed appt sched and avs for pt for jan 2017 °

## 2015-07-27 DIAGNOSIS — Z923 Personal history of irradiation: Secondary | ICD-10-CM | POA: Diagnosis not present

## 2015-07-27 DIAGNOSIS — C50412 Malignant neoplasm of upper-outer quadrant of left female breast: Secondary | ICD-10-CM | POA: Diagnosis not present

## 2015-08-20 DIAGNOSIS — H40053 Ocular hypertension, bilateral: Secondary | ICD-10-CM | POA: Diagnosis not present

## 2015-08-21 ENCOUNTER — Ambulatory Visit: Payer: Medicare Other | Admitting: Family Medicine

## 2015-08-22 ENCOUNTER — Encounter: Payer: Self-pay | Admitting: Family Medicine

## 2015-08-22 ENCOUNTER — Telehealth: Payer: Self-pay | Admitting: Hematology

## 2015-08-22 ENCOUNTER — Ambulatory Visit (INDEPENDENT_AMBULATORY_CARE_PROVIDER_SITE_OTHER): Payer: Medicare Other

## 2015-08-22 ENCOUNTER — Ambulatory Visit (INDEPENDENT_AMBULATORY_CARE_PROVIDER_SITE_OTHER): Payer: Medicare Other | Admitting: Family Medicine

## 2015-08-22 VITALS — BP 128/68 | HR 73 | Temp 97.9°F | Ht 63.0 in | Wt 157.0 lb

## 2015-08-22 DIAGNOSIS — G20 Parkinson's disease: Secondary | ICD-10-CM

## 2015-08-22 DIAGNOSIS — I10 Essential (primary) hypertension: Secondary | ICD-10-CM

## 2015-08-22 DIAGNOSIS — E559 Vitamin D deficiency, unspecified: Secondary | ICD-10-CM | POA: Diagnosis not present

## 2015-08-22 DIAGNOSIS — C50412 Malignant neoplasm of upper-outer quadrant of left female breast: Secondary | ICD-10-CM | POA: Diagnosis not present

## 2015-08-22 DIAGNOSIS — E785 Hyperlipidemia, unspecified: Secondary | ICD-10-CM | POA: Diagnosis not present

## 2015-08-22 NOTE — Telephone Encounter (Signed)
Patient called to r/s appt to 02/20 @ 10 labs, 10:30 MD

## 2015-08-22 NOTE — Patient Instructions (Addendum)
Medicare Annual Wellness Visit  [facility] and the medical providers at [facility] strive to bring you the best medical care.  In doing so we not only want to address your current medical conditions and concerns but also to detect new conditions early and prevent illness, disease and health-related problems.    Medicare offers a yearly Wellness Visit which allows our clinical staff to assess your need for preventative services including immunizations, lifestyle education, counseling to decrease risk of preventable diseases and screening for fall risk and other medical concerns.    This visit is provided free of charge (no copay) for all Medicare recipients. The clinical pharmacists at [facility] have begun to conduct these Wellness Visits which will also include a thorough review of all your medications.    As you primary medical provider recommend that you make an appointment for your Annual Wellness Visit if you have not done so already this year.  You may set up this appointment before you leave today or you may call back [phone]) and schedule an appointment.  Please make sure when you call that you mention that you are scheduling your Annual Wellness Visit with the clinical pharmacist so that the appointment may be made for the proper length of time.     Continue current medications. Continue good therapeutic lifestyle changes which include good diet and exercise. Fall precautions discussed with patient. If an FOBT was given today- please return it to our front desk. If you are over 44 years old - you may need Prevnar 77 or the adult Pneumonia vaccine.  **Flu shots are available--- please call and schedule a FLU-CLINIC appointment**  After your visit with [place] today you will receive a survey in the mail or online from [employer] regarding your care with [place]. Please take a moment to fill this out. Your feedback is very  important to [place] as you can help [place] better understand your patient needs as well as improve your experience and satisfaction. WE CARE ABOUT YOU!!!   The patient should continue to follow-up with her breast surgeon and her oncologist. She should follow-up with the neurologist because of the Parkinson's. She should hold the Crestor for 4 weeks and restarted at half the dose. If the muscle aches and cramps return she should discontinue it until we see her at the next visit She should call the neurologist and discuss the Benadryl that she is taking and its association with dementia. It may be that he will tell her to continue to take it is helping her at home with her tremor and to take it only as needed She should also inquire if he has any more samples of the medicine that he has her on it is so expensive She should reduce the oxybutynin to half the size that she is currently taking and see if this still helps her overactive bladder. By reducing this I'm sure that it could help some with her dry mouth.

## 2015-08-22 NOTE — Progress Notes (Signed)
Subjective:    Patient ID: Tracey Juarez, female    DOB: [birth year], 75 y.o.   MRN: [id number]  HPI  Pt here for follow up and management of chronic medical problems which includes hyperlipidemia and hypertension. She is taking medications regularly. The patient complains of some myalgias in her legs and a buzzing sensation in her left ear. The patient denies chest pain shortness of breath trouble swallowing heartburn indigestion and nausea vomiting diarrhea or blood in the stool. She does complain of some dry mouth. She is followed regularly by the neurologist for her Parkinson's disease, the surgeon for her breast cancer and the oncologist for her radiation treatment to her breast. She does take Benadryl but usually takes this when she is out in public and does not take that much at home. Since she had her breast cancer she has not been as active physically but plans to resume her more regular physical activity. She says that she left her Crestor off for 2 or 3 weeks and that her leg cramps and myalgias got better and when she restarted it at 20 mg the the same myalgias return. She is due today to get a chest x-ray return an FOBT and get traditional lab work. She denies chest pain shortness of breath trouble swallowing heartburn and indigestion nausea vomiting diarrhea or blood in the stool. The oxybutynin does help her frequency that she is okay with trying to reduce this to just a half a pill daily to see if the side effects of the dry mouth will diminish.     Patient Active Problem List   Diagnosis Date Noted  . Breast cancer of upper-outer quadrant of left female breast ([place]) 03/20/2015  . Osteopenia 08/15/2013  . Parkinson disease ([place])   . Hypertension   . Atrophic vaginitis   . Detrusor instability   . Melanoma ([place])   . Mixed basal-squamous cell carcinoma    Outpatient Encounter Prescriptions as of 08/22/2015  Medication Sig  . anastrozole (ARIMIDEX) 1 MG tablet Take 1 tablet (1  mg total) by mouth daily.  . AZILECT 1 MG TABS tablet Take 1 tablet by mouth daily.  . Cholecalciferol (VITAMIN D) 2000 UNITS CAPS Take 1 capsule by mouth daily.  . clopidogrel (PLAVIX) 75 MG tablet Take 75 mg by mouth daily.  . diphenhydrAMINE (BENADRYL) 25 mg capsule Take 25 mg by mouth every 6 (six) hours as needed (for parkinson's symptoms).   Marland Kitchen lisinopril (PRINIVIL,ZESTRIL) 10 MG tablet TAKE ONE TABLET BY MOUTH ONCE DAILY  . Magnesium 250 MG TABS Take 1 tablet by mouth daily.  . meclizine (ANTIVERT) 25 MG tablet TAKE TWO TABLETS BY MOUTH THREE TIMES DAILY AS NEEDED  . Multiple Vitamin (MULTIVITAMIN) capsule Take 1 capsule by mouth daily.    Marland Kitchen oxybutynin (DITROPAN) 5 MG tablet Take 1 tablet (5 mg total) by mouth 2 (two) times daily.  . ranitidine (ZANTAC) 150 MG tablet Take 150 mg by mouth 2 (two) times daily as needed.   . rosuvastatin (CRESTOR) 20 MG tablet Take 1 tablet (20 mg total) by mouth daily.   No facility-administered encounter medications on file as of 08/22/2015.     Review of Systems  Constitutional: Negative.   HENT: Negative.        Left ear "buzzing"  Eyes: Negative.   Respiratory: Negative.   Cardiovascular: Negative.   Gastrointestinal: Negative.   Endocrine: Negative.   Genitourinary: Negative.   Musculoskeletal: Positive for myalgias (legs).  Skin: Negative.  Allergic/Immunologic: Negative.   Neurological: Negative.   Hematological: Negative.   Psychiatric/Behavioral: Negative.        Objective:   Physical Exam  Constitutional: She is oriented to person, place, and time. She appears well-developed and well-nourished. No distress.  The patient is pleasant and alert and has only a mild tremor in the fingers of her right hand. This is not constant.  HENT:  Head: Normocephalic and atraumatic.  Right Ear: External ear normal.  Left Ear: External ear normal.  Nose: Nose normal.  Mouth/Throat: Oropharynx is clear and moist.  Eyes: Conjunctivae and EOM  are normal. Pupils are equal, round, and reactive to light. Right eye exhibits no discharge. Left eye exhibits no discharge. No scleral icterus.  Neck: Normal range of motion. Neck supple. No thyromegaly present.  Cardiovascular: Normal rate, regular rhythm, normal heart sounds and intact distal pulses.   No murmur heard. The heart has a regular rate and rhythm at 72/m  Pulmonary/Chest: Effort normal and breath sounds normal. No respiratory distress. She has no wheezes. She has no rales. She exhibits no tenderness.  Clear anteriorly and posteriorly  Abdominal: Soft. Bowel sounds are normal. She exhibits no mass. There is no tenderness. There is no rebound and no guarding.  No abdominal tenderness masses or bruits  Musculoskeletal: Normal range of motion. She exhibits no edema or tenderness.  Minimal if any rigidity noticed and only a slight tremor in the right hand while examining her.  Lymphadenopathy:    She has no cervical adenopathy.  Neurological: She is alert and oriented to person, place, and time. She has normal reflexes. No cranial nerve deficit.  Skin: Skin is warm and dry. No rash noted.  Psychiatric: She has a normal mood and affect. Her behavior is normal. Judgment and thought content normal.  Nursing note and vitals reviewed.   BP 153/79 mmHg  Pulse 73  Temp(Src) 97.9 F (36.6 C) (Oral)  Ht '5\' 3"'  (1.6 m)  Wt 157 lb (71.215 kg)  BMI 27.82 kg/m2  [facility] reading (PRIMARY) by  Dr. Brunilda Payor x-ray--results pending and patient left the office                                       Assessment & Plan:  1. Hyperlipemia -We'll hold the Crestor treatment for 4 weeks and then restart at half the dose and if muscle aches return she will discontinue the Crestor permanently and we will look at trying another medicine at her next visit - CBC with Differential/Platelet - Lipid panel  2. Vitamin D deficiency -Continue current treatment pending results of lab work - CBC with  Differential/Platelet - VITAMIN D 25 Hydroxy (Vit-D Deficiency, Fractures)  3. Essential hypertension -Watch sodium intake and continue with current treatment and have patient bring readings to the office for review - BMP8+EGFR - CBC with Differential/Platelet - Hepatic function panel - DG Chest 2 View; Future  4. Parkinson disease ([place]) -Kidney to follow-up with neurology - CBC with Differential/Platelet  5. Breast cancer of upper-outer quadrant of left female breast ([place]) -Continue to follow up with surgeon and oncology  No orders of the defined types were placed in this encounter.   Patient Instructions                       Medicare Annual Wellness Visit  [place] and the medical providers at [facility] strive to bring you the best medical care.  In doing so we not only want to address your current medical conditions and concerns but also to detect new conditions early and prevent illness, disease and health-related problems.    Medicare offers a yearly Wellness Visit which allows our clinical staff to assess your need for preventative services including immunizations, lifestyle education, counseling to decrease risk of preventable diseases and screening for fall risk and other medical concerns.    This visit is provided free of charge (no copay) for all Medicare recipients. The clinical pharmacists at [facility] have begun to conduct these Wellness Visits which will also include a thorough review of all your medications.    As you primary medical provider recommend that you make an appointment for your Annual Wellness Visit if you have not done so already this year.  You may set up this appointment before you leave today or you may call back ([phone]) and schedule an appointment.  Please make sure when you call that you mention that you are scheduling your Annual Wellness Visit with the clinical pharmacist so that the appointment  may be made for the proper length of time.     Continue current medications. Continue good therapeutic lifestyle changes which include good diet and exercise. Fall precautions discussed with patient. If an FOBT was given today- please return it to our front desk. If you are over 77 years old - you may need Prevnar 28 or the adult Pneumonia vaccine.  **Flu shots are available--- please call and schedule a FLU-CLINIC appointment**  After your visit with [place] today you will receive a survey in the mail or online from [employer] regarding your care with [place]. Please take a moment to fill this out. Your feedback is very important to [place] as you can help [place] better understand your patient needs as well as improve your experience and satisfaction. WE CARE ABOUT YOU!!!   The patient should continue to follow-up with her breast surgeon and her oncologist. She should follow-up with the neurologist because of the Parkinson's. She should hold the Crestor for 4 weeks and restarted at half the dose. If the muscle aches and cramps return she should discontinue it until we see her at the next visit She should call the neurologist and discuss the Benadryl that she is taking and its association with dementia. It may be that he will tell her to continue to take it is helping her at home with her tremor and to take it only as needed She should also inquire if he has any more samples of the medicine that he has her on it is so expensive She should reduce the oxybutynin to half the size that she is currently taking and see if this still helps her overactive bladder. By reducing this I'm sure that it could help some with her dry mouth.   Arrie Senate MD

## 2015-08-23 ENCOUNTER — Other Ambulatory Visit: Payer: Self-pay | Admitting: *Deleted

## 2015-08-23 LAB — CBC WITH DIFFERENTIAL/PLATELET
BASOS ABS: 0 10*3/uL (ref 0.0–0.2)
Basos: 0 %
EOS (ABSOLUTE): 0.1 10*3/uL (ref 0.0–0.4)
Eos: 2 %
Hematocrit: 41.1 % (ref 34.0–46.6)
Hemoglobin: 13.6 g/dL (ref 11.1–15.9)
IMMATURE GRANS (ABS): 0 10*3/uL (ref 0.0–0.1)
IMMATURE GRANULOCYTES: 0 %
LYMPHS: 18 %
Lymphocytes Absolute: 1.1 10*3/uL (ref 0.7–3.1)
MCH: 31.1 pg (ref 26.6–33.0)
MCHC: 33.1 g/dL (ref 31.5–35.7)
MCV: 94 fL (ref 79–97)
MONOS ABS: 0.8 10*3/uL (ref 0.1–0.9)
Monocytes: 13 %
NEUTROS PCT: 67 %
Neutrophils Absolute: 4.2 10*3/uL (ref 1.4–7.0)
PLATELETS: 237 10*3/uL (ref 150–379)
RBC: 4.37 x10E6/uL (ref 3.77–5.28)
RDW: 13.3 % (ref 12.3–15.4)
WBC: 6.3 10*3/uL (ref 3.4–10.8)

## 2015-08-23 LAB — HEPATIC FUNCTION PANEL
ALK PHOS: 68 IU/L (ref 39–117)
ALT: 16 IU/L (ref 0–32)
AST: 20 IU/L (ref 0–40)
Albumin: 4.2 g/dL (ref 3.5–4.8)
BILIRUBIN, DIRECT: 0.14 mg/dL (ref 0.00–0.40)
Bilirubin Total: 0.4 mg/dL (ref 0.0–1.2)
Total Protein: 6.9 g/dL (ref 6.0–8.5)

## 2015-08-23 LAB — LIPID PANEL
CHOLESTEROL TOTAL: 182 mg/dL (ref 100–199)
Chol/HDL Ratio: 2.9 ratio units (ref 0.0–4.4)
HDL: 63 mg/dL (ref >39)
LDL Calculated: 97 mg/dL (ref 0–99)
Triglycerides: 110 mg/dL (ref 0–149)
VLDL CHOLESTEROL CAL: 22 mg/dL (ref 5–40)

## 2015-08-23 LAB — BMP8+EGFR
BUN/Creatinine Ratio: 13 (ref 11–26)
BUN: 11 mg/dL (ref 8–27)
CALCIUM: 9.8 mg/dL (ref 8.7–10.3)
CHLORIDE: 100 mmol/L (ref 96–106)
CO2: 25 mmol/L (ref 18–29)
Creatinine, Ser: 0.83 mg/dL (ref 0.57–1.00)
GFR calc Af Amer: 81 mL/min/{1.73_m2} (ref >59)
GFR, EST NON AFRICAN AMERICAN: 70 mL/min/{1.73_m2} (ref >59)
GLUCOSE: 93 mg/dL (ref 65–99)
POTASSIUM: 4 mmol/L (ref 3.5–5.2)
Sodium: 141 mmol/L (ref 134–144)

## 2015-08-23 LAB — VITAMIN D 25 HYDROXY (VIT D DEFICIENCY, FRACTURES): VIT D 25 HYDROXY: 43.7 ng/mL (ref 30.0–100.0)

## 2015-08-23 MED ORDER — LISINOPRIL 10 MG PO TABS: 10.0000 mg | ORAL_TABLET | Freq: Every day | ORAL | Status: DC

## 2015-08-27 ENCOUNTER — Ambulatory Visit: Payer: Medicare Other | Admitting: Hematology

## 2015-08-27 ENCOUNTER — Other Ambulatory Visit: Payer: Medicare Other

## 2015-09-14 DIAGNOSIS — Z853 Personal history of malignant neoplasm of breast: Secondary | ICD-10-CM | POA: Diagnosis not present

## 2015-09-20 ENCOUNTER — Encounter: Payer: Self-pay | Admitting: Nurse Practitioner

## 2015-09-20 DIAGNOSIS — C50412 Malignant neoplasm of upper-outer quadrant of left female breast: Secondary | ICD-10-CM

## 2015-09-20 NOTE — Progress Notes (Signed)
The Survivorship Care Plan was mailed to Tracey Juarez as she reported not being able to come in to the Survivorship Clinic for an in-person visit at this time. A letter was mailed to her outlining the purpose of the content of the care plan, as well as encouraging her to reach out to me with any questions or concerns.  My business card was included in the correspondence to the patient as well.  A copy of the care plan was also routed/faxed/mailed to Redge Gainer, MD, the patient's PCP.  I will not be placing any follow-up appointments to the Survivorship Clinic for Ms. [name], but I am happy to see her at any time in the future for any survivorship concerns that may arise. Thank you for allowing me to participate in her care!  Kenn File, [place] [phone]

## 2015-09-24 ENCOUNTER — Encounter: Payer: Self-pay | Admitting: *Deleted

## 2015-09-28 ENCOUNTER — Encounter: Payer: Medicare Other | Admitting: Nurse Practitioner

## 2015-10-01 ENCOUNTER — Other Ambulatory Visit (HOSPITAL_BASED_OUTPATIENT_CLINIC_OR_DEPARTMENT_OTHER): Payer: Medicare Other

## 2015-10-01 ENCOUNTER — Ambulatory Visit (HOSPITAL_BASED_OUTPATIENT_CLINIC_OR_DEPARTMENT_OTHER): Payer: Medicare Other | Admitting: Hematology

## 2015-10-01 ENCOUNTER — Telehealth: Payer: Self-pay | Admitting: Hematology

## 2015-10-01 ENCOUNTER — Encounter: Payer: Self-pay | Admitting: Hematology

## 2015-10-01 VITALS — BP 155/64 | HR 71 | Temp 98.3°F | Resp 16 | Ht 63.0 in | Wt 159.5 lb

## 2015-10-01 DIAGNOSIS — G20 Parkinson's disease: Secondary | ICD-10-CM | POA: Diagnosis not present

## 2015-10-01 DIAGNOSIS — Z170 Estrogen receptor positive status [ER+]: Secondary | ICD-10-CM

## 2015-10-01 DIAGNOSIS — C50412 Malignant neoplasm of upper-outer quadrant of left female breast: Secondary | ICD-10-CM

## 2015-10-01 DIAGNOSIS — C4499 Other specified malignant neoplasm of skin, unspecified: Secondary | ICD-10-CM

## 2015-10-01 DIAGNOSIS — I10 Essential (primary) hypertension: Secondary | ICD-10-CM | POA: Diagnosis not present

## 2015-10-01 DIAGNOSIS — M8580 Other specified disorders of bone density and structure, unspecified site: Secondary | ICD-10-CM

## 2015-10-01 DIAGNOSIS — Z79810 Long term (current) use of selective estrogen receptor modulators (SERMs): Secondary | ICD-10-CM | POA: Diagnosis not present

## 2015-10-01 LAB — CBC WITH DIFFERENTIAL/PLATELET
BASO%: 0.6 % (ref 0.0–2.0)
BASOS ABS: 0 10*3/uL (ref 0.0–0.1)
EOS%: 3.8 % (ref 0.0–7.0)
Eosinophils Absolute: 0.2 10*3/uL (ref 0.0–0.5)
HEMATOCRIT: 42.3 % (ref 34.8–46.6)
HGB: 13.8 g/dL (ref 11.6–15.9)
LYMPH%: 25.5 % (ref 14.0–49.7)
MCH: 30.3 pg (ref 25.1–34.0)
MCHC: 32.6 g/dL (ref 31.5–36.0)
MCV: 93 fL (ref 79.5–101.0)
MONO#: 0.7 10*3/uL (ref 0.1–0.9)
MONO%: 14 % (ref 0.0–14.0)
NEUT#: 2.8 10*3/uL (ref 1.5–6.5)
NEUT%: 56.1 % (ref 38.4–76.8)
PLATELETS: 220 10*3/uL (ref 145–400)
RBC: 4.55 10*6/uL (ref 3.70–5.45)
RDW: 13.2 % (ref 11.2–14.5)
WBC: 4.9 10*3/uL (ref 3.9–10.3)
lymph#: 1.3 10*3/uL (ref 0.9–3.3)

## 2015-10-01 LAB — COMPREHENSIVE METABOLIC PANEL
ALT: 15 U/L (ref 0–55)
ANION GAP: 7 meq/L (ref 3–11)
AST: 19 U/L (ref 5–34)
Albumin: 3.8 g/dL (ref 3.5–5.0)
Alkaline Phosphatase: 68 U/L (ref 40–150)
BUN: 14.2 mg/dL (ref 7.0–26.0)
CALCIUM: 9.7 mg/dL (ref 8.4–10.4)
CHLORIDE: 107 meq/L (ref 98–109)
CO2: 27 meq/L (ref 22–29)
CREATININE: 0.9 mg/dL (ref 0.6–1.1)
EGFR: 60 mL/min/{1.73_m2} — AB (ref >90)
Glucose: 93 mg/dl (ref 70–140)
POTASSIUM: 4.5 meq/L (ref 3.5–5.1)
Sodium: 141 mEq/L (ref 136–145)
Total Bilirubin: 0.49 mg/dL (ref 0.20–1.20)
Total Protein: 6.8 g/dL (ref 6.4–8.3)

## 2015-10-01 MED ORDER — ANASTROZOLE 1 MG PO TABS: 1.0000 mg | ORAL_TABLET | Freq: Every day | ORAL | Status: DC

## 2015-10-01 NOTE — Telephone Encounter (Signed)
Pt confirmed labs/ov per 02/20 POF, gave pt AVS and Calendar... KJ °

## 2015-10-01 NOTE — Progress Notes (Signed)
[place]  [phone] [phone]  Clinic Follow Up Note   Patient Care Team: Chipper Herb, MD as PCP - General (Family Medicine) Anastasio Auerbach, MD as Consulting Physician (Gynecology) Freeman Caldron. Bjorn Loser, MD as Referring Physician (Urology) Erroll Luna, MD as Consulting Physician (General Surgery) Truitt Merle, MD as Consulting Physician (Hematology) Thea Silversmith, MD as Consulting Physician (Radiation Oncology) Mauro Kaufmann, RN as Registered Nurse Rockwell Germany, RN as Registered Nurse Sylvan Cheese, NP as Nurse Practitioner (Nurse Practitioner) 10/01/2015  CHIEF COMPLAINTS:  Follow Up left breast cancer  Oncology History   Breast cancer of upper-outer quadrant of left female breast [facility])   Staging form: Breast, AJCC 7th Edition     Clinical stage from 03/19/2015: Stage IIA (T2, N0, M0) - Signed by Truitt Merle, MD on 03/27/2015     Pathologic stage from 04/27/2015: Stage IA (T1c, N0, cM0) - Signed by Enid Cutter, MD on 05/01/2015       Staging comments: Staged on final surgical specimen by Dr. Donato Heinz.          Breast cancer of upper-outer quadrant of left female breast ([place])   03/06/2015 Mammogram Hypoechoic area in the left breast 1:00 position, 3 cm from the nipple, measuring 1.9 x 1.1 x 1.2 cm, corresponding to an area of architectural distortion on mammogram.   03/19/2015 Initial Biopsy Left breast core needle biopsy showed invasive lobular carcinoma, grade 1-2, lobular carcinoma in situ. ER 100% positive, PR 50% positive, HER-2 negative, Ki-67 5%.   03/22/2015 Breast MRI 2.1 x 1.2 x 1.7 cm irregular enhancement within the upper outer left breast compatible with biopsy-proven malignancy. No other abnormalities.   03/22/2015 Clinical Stage Stage IIA: T2 N0   04/25/2015 Definitive Surgery Left breast lumpectomy / SLNB: invasive lobular carcinoma, grade 1, margins were negative. LCIS; 3 sentinel lymph nodes were negative (0/3), HER2/neu  repeated and negative   04/25/2015 Pathologic Stage Stage IA: T1c N0   04/25/2015 Oncotype testing Recurrence score 15, which predicts 10% 10 year risk of distant recurrence with tamoxifen alone.   06/06/2015 - 06/27/2015 Radiation Therapy Adjuvant RT: Left breast 42.72 Gy over 16 fractions   08/12/2015 -  Anti-estrogen oral therapy Anastrozole 1 mg daily.   09/20/2015 Survivorship Survivorship care plan completed and mailed to patient in lieu of in person visit at pt request.    HISTORY OF PRESENTING ILLNESS:  Tracey Juarez 75 y.o. female is here because of recently diagnosed left breast cancer. She presents to our multidisciplinary clinic with her husband.  This was discovered by screening mammogram, she did not feel the lump. She denies any skin or nipple change, no constitutional symptoms. The moment mammogram and ultrasound revealed a 1.9 cm mass at 1:00 position of left breast, core needle biopsy showed invasive lobular carcinoma. ER/PR stripe positive, HER-2 negative. She underwent breast MI after biopsy, which showed a 2.1 cm irregular enhancement mass in the upper outer left breast.   She has Parkinson disesease which diagnosed 5 years ago, mild, very physically active. She has no family history of breast or ovary cancer. She has no children.  CURRENT THERAPY: anastrozole 1 mg once daily, satarted on 06/11/2016  INTERIM HISTORY Mrs. Manrique returns for follow-up.  He has been on the arimidex for about 6 weeks.  She denies any hot flashes or other side effects except she notices her nails are harder. Her Parkinson disease is stable, she has a mild difficulty to initiate steps  or change position, but she tolerates activity and functions well overall.   MEDICAL HISTORY:  Past Medical History  Diagnosis Date  . Atrophic vaginitis   . Detrusor instability   . Mixed basal-squamous cell carcinoma     skin  . Melanoma ([place])   . Elevated cholesterol   . Hypertension   . Parkinson disease  ([place])   . Breast cancer of upper-outer quadrant of left female breast ([place]) 03/20/2015  . Hot flashes   . GERD (gastroesophageal reflux disease)   . Stroke [facility])     on Plavix, no deficits    SURGICAL HISTORY: Past Surgical History  Procedure Laterality Date  . Tonsillectomy    . Foot surgery  2004  . Knee surgery  2007  . Skin cancer excised    . Breast lumpectomy with radioactive seed and sentinel lymph node biopsy Left 04/25/2015    Procedure: LEFT BREAST PARTIAL MASTECTOMY WITH RADIOACTIVE SEED AND SENTINEL LYMPH NODE MAPPING;  Surgeon: Erroll Luna, MD;  Location: [place];  Service: General;  Laterality: Left;   GYN HISTORY  Menarchal: 11 LMP: 51 Contraceptive: no  HRT: 10 years G0P0:    SOCIAL HISTORY: Social History   Social History  . Marital Status: Married    Spouse Name: N/A  . Number of Children: No   . Years of Education: N/A   Occupational History  . Retired    Social History Main Topics  . Smoking status: Never Smoker   . Smokeless tobacco: Never Used  . Alcohol Use: No  . Drug Use: No  . Sexual Activity: No   Other Topics Concern  . Not on file   Social History Narrative    FAMILY HISTORY: Family History  Problem Relation Age of Onset  . Hypertension Mother   . Heart attack Mother   . Heart attack Maternal Aunt   . Heart attack Maternal Uncle     ALLERGIES:  has No Known Allergies.  MEDICATIONS:  Current Outpatient Prescriptions  Medication Sig Dispense Refill  . AZILECT 1 MG TABS tablet Take 1 tablet by mouth daily.    . Cholecalciferol (VITAMIN D) 2000 UNITS CAPS Take 1 capsule by mouth daily.    . clopidogrel (PLAVIX) 75 MG tablet Take 75 mg by mouth daily.    . diphenhydrAMINE (BENADRYL) 25 mg capsule Take 25 mg by mouth every 6 (six) hours as needed (for parkinson's symptoms).     Marland Kitchen lisinopril (PRINIVIL,ZESTRIL) 10 MG tablet Take 1 tablet (10 mg total) by mouth daily. 90 tablet 1  . Magnesium 250 MG TABS Take  1 tablet by mouth daily.    . meclizine (ANTIVERT) 25 MG tablet TAKE TWO TABLETS BY MOUTH THREE TIMES DAILY AS NEEDED 60 tablet 2  . Multiple Vitamin (MULTIVITAMIN) capsule Take 1 capsule by mouth daily.      Marland Kitchen oxybutynin (DITROPAN) 5 MG tablet Take 1 tablet (5 mg total) by mouth 2 (two) times daily. 180 tablet 4  . ranitidine (ZANTAC) 150 MG tablet Take 150 mg by mouth 2 (two) times daily as needed.     . rosuvastatin (CRESTOR) 20 MG tablet Take 1 tablet (20 mg total) by mouth daily. 30 tablet 3  . anastrozole (ARIMIDEX) 1 MG tablet Take 1 tablet (1 mg total) by mouth daily. 90 tablet 1   No current facility-administered medications for this visit.    REVIEW OF SYSTEMS:   Constitutional: Denies fevers, chills or abnormal night sweats Eyes: Denies blurriness of  vision, double vision or watery eyes Ears, nose, mouth, throat, and face: Denies mucositis or sore throat Respiratory: Denies cough, dyspnea or wheezes Cardiovascular: Denies palpitation, chest discomfort or lower extremity swelling Gastrointestinal:  Denies nausea, heartburn or change in bowel habits Skin: Denies abnormal skin rashes Lymphatics: Denies new lymphadenopathy or easy bruising Neurological:Denies numbness, tingling or new weaknesses Behavioral/Psych: Mood is stable, no new changes  All other systems were reviewed with the patient and are negative.  PHYSICAL EXAMINATION: ECOG PERFORMANCE STATUS: 0 - Asymptomatic  Filed Vitals:   10/01/15 1026  BP: 155/64  Pulse: 71  Temp: 98.3 F (36.8 C)  Resp: 16   Filed Weights   10/01/15 1026  Weight: 159 lb 8 oz (72.349 kg)    GENERAL:alert, no distress and comfortable SKIN: skin color, texture, turgor are normal, no rashes or significant lesions EYES: normal, conjunctiva are pink and non-injected, sclera clear OROPHARYNX:no exudate, no erythema and lips, buccal mucosa, and tongue normal  NECK: supple, thyroid normal size, non-tender, without nodularity LYMPH:  no  palpable lymphadenopathy in the cervical, axillary or inguinal LUNGS: clear to auscultation and percussion with normal breathing effort HEART: regular rate & rhythm and no murmurs and no lower extremity edema ABDOMEN:abdomen soft, non-tender and normal bowel sounds Musculoskeletal:no cyanosis of digits and no clubbing  PSYCH: alert & oriented x 3 with fluent speech NEURO: no focal motor/sensory deficits Breasts: Breast inspection showed them to be symmetrical with no nipple discharge. (+) Diffuse skin pigmentation at left breast, nontender, no palpable mass. Palpation of the right breast and axilla revealed no obvious mass that I could appreciate.   LABORATORY DATA:  I have reviewed the data as listed Lab Results  Component Value Date   WBC 4.9 10/01/2015   HGB 13.8 10/01/2015   HCT 42.3 10/01/2015   MCV 93.0 10/01/2015   PLT 220 10/01/2015    Recent Labs  02/16/15 0947  07/16/15 1228 08/22/15 1632 10/01/15 0953  NA 140  < > 142 141 141  K 4.3  < > 4.2 4.0 4.5  CL 101  --   --  100  --   CO2 25  < > _0 GLUCOSE 97  < > 95 93 93  BUN 14  < > 13.3 11 14.2  CREATININE 0.92  < > 0.9 0.83 0.9  CALCIUM 9.4  < > 9.8 9.8 9.7  GFRNONAA 62  --   --  70  --   GFRAA 72  --   --  81  --   PROT 6.8  < > 6.9 6.9 6.8  ALBUMIN 4.5  < > 3.8 4.2 3.8  AST 20  < > _1 ALT 15  < > _2 ALKPHOS 60  < > 72 68 68  BILITOT 0.6  < > 0.50 0.4 0.49  BILIDIR 0.15  --   --  0.14  --   < > = values in this interval not displayed. PATHOLOGY REPORT: Diagnosis 04/25/2015 1. Breast, lumpectomy, left - INVASIVE LOBULAR CARCINOMA, SEE COMMENT. 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 - PREVIOUS BIOPSY SITE. - SEE TUMOR SYNOPTIC TEMPLATE BELOW. 2. Lymph node, sentinel, biopsy, left - ONE LYMPH NODE, NEGATIVE FOR TUMOR (0/1) SEE COMMENT. 3. Lymph node, sentinel, biopsy, left - ONE LYMPH NODE, NEGATIVE FOR TUMOR (0/1) SEE COMMENT. 4. Lymph node,  sentinel, biopsy, left - ONE LYMPH NODE, NEGATIVE FOR TUMOR (0/1) SEE COMMENT. Specimen, including  laterality and lymph node sampling (sentinel, non-sentinel): Left breat with sentinel lymph node sampling. Procedure: Lumpectomy. Histologic type: Lobular. Grade: 1 of 3 Tubule formation: 3 Nuclear pleomorphism: 1 Mitotic:1 Tumor size (gross measurement): 1.8 cm Margins: Invasive, distance to closest margin: 0.5 cm (anterior) In-situ, distance to closest margin: N/A If margin positive, focally or broadly: N/A Lymphovascular invasion: Absent. Ductal carcinoma in situ: Absent. Grade: Extensive. Extensive intraductal component: N/A Lobular neoplasia: Present. Tumor focality: Unifocal. Treatment effect: None. If present, treatment effect in breast tissue, lymph nodes or both: N/A Extent of tumor: Skin: N/A Nipple: N/A Skeletal muscle: N/A Lymph nodes: Examined: 3 Sentinel 0 Non-sentinel 3 Total Lymph nodes with metastasis: 0 Isolated tumor cells (< 0.2 mm): N/A Micrometastasis: (> 0.2 mm and < 2.0 mm): N/A Macrometastasis: (> 2.0 mm): N/A Extracapsular extension: N/A Breast prognostic profile: Estrogen receptor: Not repeated, previous study demonstrated 100% positivity, [id number]. Progesterone receptor: Not repeated, previous study demonstrated 50% positivity, [id number]. Her 2 neu: Repeated, previous study demonstrated no amplification, [id number]. Ki-67: Not repeated, previous study demonstrated 5% proliferation rate. Non-neoplastic breast: Previous biopsy site, fibrocystic change, benign radial scar, calcifications. TNM: pT1c, pN0, pMX Comments: None. 2. - 4. There are no intranodal malignant epithelial tumor deposits identified on routine histology or with cytokeratin AE1/3 immunostains. (CRR:ecj 04/26/2015)  Results: HER2 - NEGATIVE RATIO OF HER2/CEP17 SIGNALS 1.69 AVERAGE HER2 COPY NUMBER PER CELL 2.45  Oncotype RS 15  RADIOGRAPHIC STUDIES: I have personally  reviewed the radiological images as listed and agreed with the findings in the report.  Mr Breast Bilateral W Wo Contrast 03/23/2015   IMPRESSION: 2.1 x 1.2 x 1.7 cm irregular enhancement within the upper outer left breast compatible with biopsy-proven malignancy. No other suspicious abnormalities identified although study is slightly limited by motion artifact.  No mammographic evidence of right breast malignancy.  RECOMMENDATION: Treatment plan  BI-RADS CATEGORY  6: Known biopsy-proven malignancy.   Electronically Signed   By: Margarette Canada M.D.   On: 03/23/2015 12:30   MM and US Breast Ltd Uni Left Inc Axilla 03/06/2015    IMPRESSION: Vague hypoechoic area with associated shadowing located within the left breast at the 1 o'clock axis, 3 cm from the nipple, measuring 1.9 x 1.1 x 1.2 cm, corresponding to an area of architectural distortion on mammogram. This is a suspicious finding for which ultrasound-guided biopsy is recommended.  RECOMMENDATION: Ultrasound-guided biopsy of the vague hypoechoic area with associated shadowing located within the left breast at the 1 o'clock axis, 3 cm from the nipple, measuring 1.9 x 1.1 x 1.2 cm, corresponding to an area of architectural distortion on mammogram. This biopsy will be scheduled after a consultation with Dr. Bjorn Loser in regards to patient's current blood thinning medication regimen.  I have discussed the findings and recommendations with the patient. Results were also provided in writing at the conclusion of the visit. If applicable, a reminder letter will be sent to the patient regarding the next appointment.  BI-RADS CATEGORY  4: Suspicious.   Electronically Signed   By: Franki Cabot M.D.   On: 03/06/2015 13:00    Mm Diag Breast Tomo Uni Right  03/22/2015   CLINICAL DATA:  Patient is a 75 year old female with recent diagnosis of left breast invasive lobular carcinoma on image guided biopsy performed 03/15/2015. She presents today for right diagnostic mammography  prior to contrast-enhanced breast MR.  EXAM: DIGITAL DIAGNOSTIC RIGHT MAMMOGRAM WITH 3D TOMOSYNTHESIS AND CAD  COMPARISON:  Previous exam(s).  ACR Breast Density Category c:  The breast tissue is heterogeneously dense, which may obscure small masses.  FINDINGS: Right unilateral digital diagnostic mammography demonstrates a heterogeneously dense parenchymal pattern which may obscure detection of small masses. Note is again made of scattered high-density round and rod-like calcifications with benign features. No new mass or suspicious microcalcification. There has been no significant interval change from comparison mammography.  Mammographic images were processed with CAD.  IMPRESSION: 1. No mammographic findings of malignancy in the right breast. 2. Patient has biopsy-proven invasive lobular carcinoma in the upper outer left breast. She is scheduled for contrast-enhanced bilateral breast MRI in the near future as well as the Multidisciplinary Clinic on 03/28/2015.  RECOMMENDATION: Continued surgical management of known malignancy in the left breast.  I have discussed the findings and recommendations with the patient. Results were also provided in writing at the conclusion of the visit. If applicable, a reminder letter will be sent to the patient regarding the next appointment.  BI-RADS CATEGORY  2: Benign.   Electronically Signed   By: Andres Shad   On: 03/22/2015 14:46     ASSESSMENT & PLAN: 75 year old Caucasian female, postmenopausal, presented with screening detected left breast cancer.  1. Left breast invasive lobular carcinoma, pT1cN0M0, stage IA, ER/PR positive, HER-2 negative, Ki-67 5%, Oncotype low risk -I previously reviewed her surgical pathology findings with her and her husband in great detail. -She had a complete surgical resection, margins were negative, sentinel lymph nodes were negative, she has low-grade stage I breast cancer, likely cured by surgery alone. -We discussed the small risk of  cancer recurrence after her surgery. I reviewed her Oncotype DX test results, the recurrence score is 15, which predicts 10% 10 year distant recurrence with tamoxifen alone. -she is currently on adjuvant anastrozole, tolerating well, we'll continue, planning for total 5 years. -we'll continue breast cancer surveillance with annual mammogram, self-exam, and routine follow-up with [place].  2. Osteopenia -She hada bone density scan done in April 2015, which showed osteopenia with T score of -1.9 - I encouraged her to continue vitamin D, and add on calcium  -We discussed that the national may potentially make her osteopenia worse.  -We'll follow-up her bone density scan every 2 years.  2. Parkinson disease, hypertension -She'll continue follow-up with her primary care physician  Plan -continue anastrozole, I refilled for her today  -Return in 3 month for follow-up and lab, we'll order routine mammogram on the next visit  All questions were answered. The patient knows to call the clinic with any problems, questions or concerns.  I spent 20 minutes counseling the patient face to face. The total time spent in the appointment was 25 minutes and more than 50% was on counseling.     Truitt Merle, MD 10/01/2015 10:53 AM

## 2015-10-16 DIAGNOSIS — L570 Actinic keratosis: Secondary | ICD-10-CM | POA: Diagnosis not present

## 2015-10-16 DIAGNOSIS — Z85828 Personal history of other malignant neoplasm of skin: Secondary | ICD-10-CM | POA: Diagnosis not present

## 2015-11-01 ENCOUNTER — Ambulatory Visit (INDEPENDENT_AMBULATORY_CARE_PROVIDER_SITE_OTHER): Payer: Medicare Other | Admitting: Gynecology

## 2015-11-01 ENCOUNTER — Encounter: Payer: Self-pay | Admitting: Gynecology

## 2015-11-01 VITALS — BP 120/66 | Ht 62.0 in | Wt 159.0 lb

## 2015-11-01 DIAGNOSIS — M8580 Other specified disorders of bone density and structure, unspecified site: Secondary | ICD-10-CM

## 2015-11-01 DIAGNOSIS — N952 Postmenopausal atrophic vaginitis: Secondary | ICD-10-CM | POA: Diagnosis not present

## 2015-11-01 DIAGNOSIS — C50912 Malignant neoplasm of unspecified site of left female breast: Secondary | ICD-10-CM | POA: Diagnosis not present

## 2015-11-01 DIAGNOSIS — Z01419 Encounter for gynecological examination (general) (routine) without abnormal findings: Secondary | ICD-10-CM

## 2015-11-01 NOTE — Progress Notes (Signed)
    Tracey Juarez 1940/08/17 [id number]        74 y.o.  G0P0  for breast and pelvic exam. Several issues noted below.  Past medical history,surgical history, problem list, medications, allergies, family history and social history were all reviewed and documented as reviewed in the EPIC chart.  ROS:  Performed with pertinent positives and negatives included in the history, assessment and plan.   Additional significant findings :  none   Exam: Caryn Bee assistant Filed Vitals:   11/01/15 1013  BP: 120/66  Height: 5\' 2"  (1.575 m)  Weight: 159 lb (72.122 kg)   General appearance:  Normal affect, orientation and appearance. Skin: Grossly normal HEENT: Without gross lesions.  No cervical or supraclavicular adenopathy. Thyroid normal.  Lungs:  Clear without wheezing, rales or rhonchi Cardiac: RR, without RMG Abdominal:  Soft, nontender, without masses, guarding, rebound, organomegaly or hernia Breasts:  Examined lying and sitting. Right without masses, retractions, discharge or axillary adenopathy.  Left with radiation changes and well-healed lumpectomy/axillary scars. No masses or axillary adenopathy Pelvic:  Ext/BUS/vagina with atrophic changes  Cervix atrophic  Uterus anteverted, normal size, shape and contour, midline and mobile nontender   Adnexa without masses or tenderness    Anus and perineum normal   Rectovaginal normal sphincter tone without palpated masses or tenderness.    Assessment/Plan:  75 y.o. G0P0 female for breast and pelvic exam  1. Postmenopausal/atrophic genital changes.  Had been on estradiol vaginal cream previously but discontinued. She is doing okay with vaginal dryness. No significant hot flushes, night sweats or any vaginal bleeding. We'll continue to monitor and report any vaginal bleeding. 2. Breast cancer, left status post lumpectomy and radiation. On Arimidex. Exam no evidence of disease. Does have radiation changes throughout the breast but no  specific masses or axillary adenopathy. We'll continue follow up with her oncologist and recommendations as to when to schedule her next mammograms. SBE  Reviewed. 3. Osteopenia.  DEXA 2015 T score -1.9 stable from prior DEXA. She is on Arimidex now. She arranges her Dexa's through her primary physician's office and I would suggest going ahead and repeating it now as a baseline. Patient is going to discuss with her primary physician.  Increased calcium and vitamin D. 4. Pap smear 2012. No Pap smear done today. No history of significant abnormal Pap smears. Per current screening guidelines we both agree to stop screening based on age. 5. Colonoscopy 2015. Repeat at their recommended interval. 6. Health maintenance. No routine lab work done as patient reports this done through her primary physician's office. Follow up 1 year, sooner as needed.   Anastasio Auerbach MD, 10:38 AM 11/01/2015

## 2015-11-01 NOTE — Patient Instructions (Signed)

## 2015-11-14 ENCOUNTER — Encounter: Payer: Self-pay | Admitting: Family Medicine

## 2015-11-14 ENCOUNTER — Ambulatory Visit (INDEPENDENT_AMBULATORY_CARE_PROVIDER_SITE_OTHER): Payer: Medicare Other | Admitting: Family Medicine

## 2015-11-14 VITALS — BP 131/82 | HR 81 | Temp 98.6°F | Ht 62.0 in | Wt 157.8 lb

## 2015-11-14 DIAGNOSIS — N318 Other neuromuscular dysfunction of bladder: Secondary | ICD-10-CM

## 2015-11-14 DIAGNOSIS — N3281 Overactive bladder: Secondary | ICD-10-CM

## 2015-11-14 DIAGNOSIS — M65312 Trigger thumb, left thumb: Secondary | ICD-10-CM

## 2015-11-14 MED ORDER — METHYLPREDNISOLONE ACETATE 80 MG/ML IJ SUSP
40.0000 mg | Freq: Once | INTRAMUSCULAR | Status: AC
  Administered 2015-11-14: 40 mg via INTRALESIONAL

## 2015-11-14 NOTE — Progress Notes (Signed)
BP 131/82 mmHg  Pulse 81  Temp(Src) 98.6 F (37 C) (Oral)  Ht 5\' 2"  (1.575 m)  Wt 157 lb 12.8 oz (71.578 kg)  BMI 28.85 kg/m2   Subjective:    Patient ID: Tracey Juarez, female    DOB: [birth year], 75 y.o.   MRN: [id number]  HPI: Tracey Juarez is a 75 y.o. female presenting on 11/14/2015 for Thumb pain   HPI Thumb pain Patient has been having pain in her left thumb near the MCP joint for the past month. She is also been having some catching and popping of that thumb that is been worsening over the past couple weeks. She denies any overlying skin changes or fevers or chills. She has not had this previously. She is concerned whether be arthritis and has taken some Tylenol but has not been getting any better.  Relevant past medical, surgical, family and social history reviewed and updated as indicated. Interim medical history since our last visit reviewed. Allergies and medications reviewed and updated.  Review of Systems  Constitutional: Negative for fever and chills.  HENT: Negative for congestion, ear discharge and ear pain.   Eyes: Negative for redness and visual disturbance.  Respiratory: Negative for chest tightness and shortness of breath.   Cardiovascular: Negative for chest pain and leg swelling.  Genitourinary: Negative for dysuria and difficulty urinating.  Musculoskeletal: Positive for arthralgias. Negative for back pain, joint swelling and gait problem.  Skin: Negative for rash.  Neurological: Negative for light-headedness and headaches.  Psychiatric/Behavioral: Negative for behavioral problems and agitation.  All other systems reviewed and are negative.   Per HPI unless specifically indicated above     Medication List       This list is accurate as of: 11/14/15  8:47 AM.  Always use your most recent med list.               anastrozole 1 MG tablet  Commonly known as:  ARIMIDEX  Take 1 tablet (1 mg total) by mouth daily.     AZILECT 1 MG Tabs tablet    Generic drug:  rasagiline  Take 1 tablet by mouth daily.     clopidogrel 75 MG tablet  Commonly known as:  PLAVIX  Take 75 mg by mouth daily.     diphenhydrAMINE 25 mg capsule  Commonly known as:  BENADRYL  Take 25 mg by mouth every 6 (six) hours as needed (for parkinson's symptoms).     lisinopril 10 MG tablet  Commonly known as:  PRINIVIL,ZESTRIL  Take 1 tablet (10 mg total) by mouth daily.     Magnesium 250 MG Tabs  Take 1 tablet by mouth daily.     meclizine 25 MG tablet  Commonly known as:  ANTIVERT  TAKE TWO TABLETS BY MOUTH THREE TIMES DAILY AS NEEDED     multivitamin capsule  Take 1 capsule by mouth daily.     oxybutynin 5 MG tablet  Commonly known as:  DITROPAN  Take 1 tablet (5 mg total) by mouth 2 (two) times daily.     ranitidine 150 MG tablet  Commonly known as:  ZANTAC  Take 150 mg by mouth 2 (two) times daily as needed.     rosuvastatin 20 MG tablet  Commonly known as:  CRESTOR  Take 1 tablet (20 mg total) by mouth daily.     Vitamin D 2000 units Caps  Take 1 capsule by mouth daily.  Objective:    BP 131/82 mmHg  Pulse 81  Temp(Src) 98.6 F (37 C) (Oral)  Ht 5\' 2"  (1.575 m)  Wt 157 lb 12.8 oz (71.578 kg)  BMI 28.85 kg/m2  Wt Readings from Last 3 Encounters:  11/14/15 157 lb 12.8 oz (71.578 kg)  11/01/15 159 lb (72.122 kg)  10/01/15 159 lb 8 oz (72.349 kg)    Physical Exam  Constitutional: She is oriented to person, place, and time. She appears well-developed and well-nourished. No distress.  Eyes: Conjunctivae and EOM are normal. Pupils are equal, round, and reactive to light.  Cardiovascular: Normal rate, regular rhythm, normal heart sounds and intact distal pulses.   No murmur heard. Pulmonary/Chest: Effort normal and breath sounds normal. No respiratory distress. She has no wheezes.  Musculoskeletal: Normal range of motion. She exhibits no edema or tenderness.       Hands: Neurological: She is alert and oriented to person,  place, and time. Coordination normal.  Skin: Skin is warm and dry. No rash noted. She is not diaphoretic.  Psychiatric: She has a normal mood and affect. Her behavior is normal.  Nursing note and vitals reviewed.  Trigger finger injection: Risk factors of bleeding and infection discussed with patient and patient is agreeable towards injection. Patient prepped with Betadine. Injected directly around tendon sheath. Injected 40 mg of Depo-Medrol and 0.5 mL of 2% lidocaine. Patient tolerated procedure well and no side effects from noted. Minimal to no bleeding. Simple bandage applied after.    Assessment & Plan:   Problem List Items Addressed This Visit      Genitourinary   Detrusor instability    Other Visit Diagnoses    Trigger thumb of left hand    -  Primary    Relevant Medications    methylPREDNISolone acetate (DEPO-MEDROL) injection 40 mg (Completed)        Follow up plan: Return if symptoms worsen or fail to improve.  Counseling provided for all of the vaccine components No orders of the defined types were placed in this encounter.    Caryl Pina, MD [facility] 11/14/2015, 8:47 AM

## 2015-12-19 DIAGNOSIS — L820 Inflamed seborrheic keratosis: Secondary | ICD-10-CM | POA: Diagnosis not present

## 2015-12-19 DIAGNOSIS — D485 Neoplasm of uncertain behavior of skin: Secondary | ICD-10-CM | POA: Diagnosis not present

## 2015-12-21 ENCOUNTER — Telehealth: Payer: Self-pay | Admitting: Hematology

## 2015-12-21 NOTE — Telephone Encounter (Signed)
per Dr Burr Medico to sch pt appt-cld pt and left a message of time & date of r/s appt per Dr Burr Medico

## 2015-12-26 ENCOUNTER — Ambulatory Visit (HOSPITAL_BASED_OUTPATIENT_CLINIC_OR_DEPARTMENT_OTHER): Payer: Medicare Other | Admitting: Hematology

## 2015-12-26 ENCOUNTER — Other Ambulatory Visit: Payer: Medicare Other

## 2015-12-26 ENCOUNTER — Other Ambulatory Visit (HOSPITAL_BASED_OUTPATIENT_CLINIC_OR_DEPARTMENT_OTHER): Payer: Medicare Other

## 2015-12-26 ENCOUNTER — Ambulatory Visit: Payer: Medicare Other | Admitting: Hematology

## 2015-12-26 ENCOUNTER — Encounter: Payer: Self-pay | Admitting: Hematology

## 2015-12-26 ENCOUNTER — Telehealth: Payer: Self-pay | Admitting: Hematology

## 2015-12-26 VITALS — BP 138/65 | HR 79 | Temp 98.4°F | Resp 18 | Ht 62.0 in | Wt 156.8 lb

## 2015-12-26 DIAGNOSIS — M8580 Other specified disorders of bone density and structure, unspecified site: Secondary | ICD-10-CM

## 2015-12-26 DIAGNOSIS — C50412 Malignant neoplasm of upper-outer quadrant of left female breast: Secondary | ICD-10-CM | POA: Diagnosis not present

## 2015-12-26 DIAGNOSIS — Z79811 Long term (current) use of aromatase inhibitors: Secondary | ICD-10-CM | POA: Diagnosis not present

## 2015-12-26 DIAGNOSIS — I10 Essential (primary) hypertension: Secondary | ICD-10-CM | POA: Diagnosis not present

## 2015-12-26 DIAGNOSIS — Z170 Estrogen receptor positive status [ER+]: Secondary | ICD-10-CM | POA: Diagnosis not present

## 2015-12-26 DIAGNOSIS — G20 Parkinson's disease: Secondary | ICD-10-CM

## 2015-12-26 DIAGNOSIS — C4499 Other specified malignant neoplasm of skin, unspecified: Secondary | ICD-10-CM

## 2015-12-26 LAB — CBC WITH DIFFERENTIAL/PLATELET
BASO%: 0.9 % (ref 0.0–2.0)
Basophils Absolute: 0 10*3/uL (ref 0.0–0.1)
EOS ABS: 0.1 10*3/uL (ref 0.0–0.5)
EOS%: 2.4 % (ref 0.0–7.0)
HEMATOCRIT: 39.8 % (ref 34.8–46.6)
HGB: 13.1 g/dL (ref 11.6–15.9)
LYMPH#: 1.3 10*3/uL (ref 0.9–3.3)
LYMPH%: 23.3 % (ref 14.0–49.7)
MCH: 30.2 pg (ref 25.1–34.0)
MCHC: 32.9 g/dL (ref 31.5–36.0)
MCV: 91.7 fL (ref 79.5–101.0)
MONO#: 0.7 10*3/uL (ref 0.1–0.9)
MONO%: 13.2 % (ref 0.0–14.0)
NEUT#: 3.3 10*3/uL (ref 1.5–6.5)
NEUT%: 60.2 % (ref 38.4–76.8)
PLATELETS: 227 10*3/uL (ref 145–400)
RBC: 4.34 10*6/uL (ref 3.70–5.45)
RDW: 14.1 % (ref 11.2–14.5)
WBC: 5.5 10*3/uL (ref 3.9–10.3)

## 2015-12-26 LAB — COMPREHENSIVE METABOLIC PANEL
ALT: 14 U/L (ref 0–55)
ANION GAP: 7 meq/L (ref 3–11)
AST: 18 U/L (ref 5–34)
Albumin: 3.9 g/dL (ref 3.5–5.0)
Alkaline Phosphatase: 69 U/L (ref 40–150)
BILIRUBIN TOTAL: 0.35 mg/dL (ref 0.20–1.20)
BUN: 18.3 mg/dL (ref 7.0–26.0)
CALCIUM: 10 mg/dL (ref 8.4–10.4)
CO2: 29 meq/L (ref 22–29)
CREATININE: 0.9 mg/dL (ref 0.6–1.1)
Chloride: 105 mEq/L (ref 98–109)
EGFR: 60 mL/min/{1.73_m2} — AB (ref >90)
Glucose: 91 mg/dl (ref 70–140)
Potassium: 4.3 mEq/L (ref 3.5–5.1)
Sodium: 140 mEq/L (ref 136–145)
TOTAL PROTEIN: 7 g/dL (ref 6.4–8.3)

## 2015-12-26 NOTE — Telephone Encounter (Signed)
Gave pt apt & avs °

## 2015-12-26 NOTE — Progress Notes (Signed)
[facility]  [phone] [phone]  Clinic Follow Up Note   Patient Care Team: Chipper Herb, MD as PCP - General (Family Medicine) Anastasio Auerbach, MD as Consulting Physician (Gynecology) Freeman Caldron. Bjorn Loser, MD as Referring Physician (Urology) Erroll Luna, MD as Consulting Physician (General Surgery) Truitt Merle, MD as Consulting Physician (Hematology) Thea Silversmith, MD as Consulting Physician (Radiation Oncology) Mauro Kaufmann, RN as Registered Nurse Rockwell Germany, RN as Registered Nurse Sylvan Cheese, NP as Nurse Practitioner (Nurse Practitioner) 12/26/2015  CHIEF COMPLAINTS:  Follow Up left breast cancer  Oncology History   Breast cancer of upper-outer quadrant of left female breast [facility])   Staging form: Breast, AJCC 7th Edition     Clinical stage from 03/19/2015: Stage IIA (T2, N0, M0) - Signed by Truitt Merle, MD on 03/27/2015     Pathologic stage from 04/27/2015: Stage IA (T1c, N0, cM0) - Signed by Enid Cutter, MD on 05/01/2015       Staging comments: Staged on final surgical specimen by Dr. Donato Heinz.          Breast cancer of upper-outer quadrant of left female breast ([place])   03/06/2015 Mammogram Hypoechoic area in the left breast 1:00 position, 3 cm from the nipple, measuring 1.9 x 1.1 x 1.2 cm, corresponding to an area of architectural distortion on mammogram.   03/19/2015 Initial Biopsy Left breast core needle biopsy showed invasive lobular carcinoma, grade 1-2, lobular carcinoma in situ. ER 100% positive, PR 50% positive, HER-2 negative, Ki-67 5%.   03/22/2015 Breast MRI 2.1 x 1.2 x 1.7 cm irregular enhancement within the upper outer left breast compatible with biopsy-proven malignancy. No other abnormalities.   03/22/2015 Clinical Stage Stage IIA: T2 N0   04/25/2015 Definitive Surgery Left breast lumpectomy / SLNB: invasive lobular carcinoma, grade 1, margins were negative. LCIS; 3 sentinel lymph nodes were negative (0/3), HER2/neu  repeated and negative   04/25/2015 Pathologic Stage Stage IA: T1c N0   04/25/2015 Oncotype testing Recurrence score 15, which predicts 10% 10 year risk of distant recurrence with tamoxifen alone.   06/06/2015 - 06/27/2015 Radiation Therapy Adjuvant RT: Left breast 42.72 Gy over 16 fractions   08/12/2015 -  Anti-estrogen oral therapy Anastrozole 1 mg daily.   09/20/2015 Survivorship Survivorship care plan completed and mailed to patient in lieu of in person visit at pt request.    HISTORY OF PRESENTING ILLNESS:  Tracey Juarez 75 y.o. female is here because of recently diagnosed left breast cancer. She presents to our multidisciplinary clinic with her husband.  This was discovered by screening mammogram, she did not feel the lump. She denies any skin or nipple change, no constitutional symptoms. The moment mammogram and ultrasound revealed a 1.9 cm mass at 1:00 position of left breast, core needle biopsy showed invasive lobular carcinoma. ER/PR stripe positive, HER-2 negative. She underwent breast MI after biopsy, which showed a 2.1 cm irregular enhancement mass in the upper outer left breast.   She has Parkinson disesease which diagnosed 5 years ago, mild, very physically active. She has no family history of breast or ovary cancer. She has no children.  CURRENT THERAPY: anastrozole 1 mg once daily, satarted on 06/11/2016  INTERIM HISTORY Mrs. Brocato returns for follow-up. She is Accompanied by her husband to clinic Today. She is compliant with anastrozole, reports worsening hot flash, especially after hot shower, it happens several times a day, not much at night. She also notices leg cramps, especially at night, which  wakes her up. She otherwise tolerating anastrozole well, no significant arthritis, her tremor is stable. No other new complaints.   MEDICAL HISTORY:  Past Medical History  Diagnosis Date  . Atrophic vaginitis   . Detrusor instability   . Mixed basal-squamous cell carcinoma     skin    . Melanoma ([place])   . Elevated cholesterol   . Hypertension   . Parkinson disease ([place])   . Breast cancer of upper-outer quadrant of left female breast ([place]) 03/20/2015  . Hot flashes   . GERD (gastroesophageal reflux disease)   . Stroke [facility])     on Plavix, no deficits    SURGICAL HISTORY: Past Surgical History  Procedure Laterality Date  . Tonsillectomy    . Foot surgery  2004  . Knee surgery  2007  . Skin cancer excised    . Breast lumpectomy with radioactive seed and sentinel lymph node biopsy Left 04/25/2015    Procedure: LEFT BREAST PARTIAL MASTECTOMY WITH RADIOACTIVE SEED AND SENTINEL LYMPH NODE MAPPING;  Surgeon: Erroll Luna, MD;  Location: [facility];  Service: General;  Laterality: Left;   GYN HISTORY  Menarchal: 11 LMP: 51 Contraceptive: no  HRT: 10 years G0P0:    SOCIAL HISTORY: Social History   Social History  . Marital Status: Married    Spouse Name: N/A  . Number of Children: No   . Years of Education: N/A   Occupational History  . Retired    Social History Main Topics  . Smoking status: Never Smoker   . Smokeless tobacco: Never Used  . Alcohol Use: No  . Drug Use: No  . Sexual Activity: No   Other Topics Concern  . Not on file   Social History Narrative    FAMILY HISTORY: Family History  Problem Relation Age of Onset  . Hypertension Mother   . Heart attack Mother   . Heart attack Maternal Aunt   . Heart attack Maternal Uncle     ALLERGIES:  has No Known Allergies.  MEDICATIONS:  Current Outpatient Prescriptions  Medication Sig Dispense Refill  . anastrozole (ARIMIDEX) 1 MG tablet Take 1 tablet (1 mg total) by mouth daily. 90 tablet 1  . AZILECT 1 MG TABS tablet Take 1 tablet by mouth daily.    . Cholecalciferol (VITAMIN D) 2000 UNITS CAPS Take 1 capsule by mouth daily.    . clopidogrel (PLAVIX) 75 MG tablet Take 75 mg by mouth daily.    . diphenhydrAMINE (BENADRYL) 25 mg capsule Take 25 mg by mouth every 6 (six)  hours as needed (for parkinson's symptoms).     Marland Kitchen lisinopril (PRINIVIL,ZESTRIL) 10 MG tablet Take 1 tablet (10 mg total) by mouth daily. 90 tablet 1  . Magnesium 250 MG TABS Take 1 tablet by mouth daily.    . meclizine (ANTIVERT) 25 MG tablet TAKE TWO TABLETS BY MOUTH THREE TIMES DAILY AS NEEDED 60 tablet 2  . Multiple Vitamin (MULTIVITAMIN) capsule Take 1 capsule by mouth daily.      Marland Kitchen oxybutynin (DITROPAN) 5 MG tablet Take 1 tablet (5 mg total) by mouth 2 (two) times daily. (Patient not taking: Reported on 11/14/2015) 180 tablet 4  . ranitidine (ZANTAC) 150 MG tablet Take 150 mg by mouth 2 (two) times daily as needed.     . rosuvastatin (CRESTOR) 20 MG tablet Take 1 tablet (20 mg total) by mouth daily. (Patient not taking: Reported on 11/14/2015) 30 tablet 3   No current facility-administered medications for this  visit.    REVIEW OF SYSTEMS:   Constitutional: Denies fevers, chills or abnormal night sweats Eyes: Denies blurriness of vision, double vision or watery eyes Ears, nose, mouth, throat, and face: Denies mucositis or sore throat Respiratory: Denies cough, dyspnea or wheezes Cardiovascular: Denies palpitation, chest discomfort or lower extremity swelling Gastrointestinal:  Denies nausea, heartburn or change in bowel habits Skin: Denies abnormal skin rashes Lymphatics: Denies new lymphadenopathy or easy bruising Neurological:Denies numbness, tingling or new weaknesses Behavioral/Psych: Mood is stable, no new changes  All other systems were reviewed with the patient and are negative.  PHYSICAL EXAMINATION: ECOG PERFORMANCE STATUS: 0 - Asymptomatic  Filed Vitals:   12/26/15 1410  BP: 138/65  Pulse: 79  Temp: 98.4 F (36.9 C)  Resp: 18   Filed Weights   12/26/15 1410  Weight: 156 lb 12.8 oz (71.124 kg)    GENERAL:alert, no distress and comfortable SKIN: skin color, texture, turgor are normal, no rashes or significant lesions EYES: normal, conjunctiva are pink and  non-injected, sclera clear OROPHARYNX:no exudate, no erythema and lips, buccal mucosa, and tongue normal  NECK: supple, thyroid normal size, non-tender, without nodularity LYMPH:  no palpable lymphadenopathy in the cervical, axillary or inguinal LUNGS: clear to auscultation and percussion with normal breathing effort HEART: regular rate & rhythm and no murmurs and no lower extremity edema ABDOMEN:abdomen soft, non-tender and normal bowel sounds Musculoskeletal:no cyanosis of digits and no clubbing  PSYCH: alert & oriented x 3 with fluent speech NEURO: no focal motor/sensory deficits Breasts: Breast inspection showed them to be symmetrical with no nipple discharge. Mild diffuse skin pigmentation at left breast, nontender, no palpable mass. Palpation of the right breast and axilla revealed no obvious mass that I could appreciate.   LABORATORY DATA:  I have reviewed the data as listed Lab Results  Component Value Date   WBC 5.5 12/26/2015   HGB 13.1 12/26/2015   HCT 39.8 12/26/2015   MCV 91.7 12/26/2015   PLT 227 12/26/2015    Recent Labs  02/16/15 0947  08/22/15 1632 10/01/15 0953 12/26/15 1325  NA 140  < > 141 141 140  K 4.3  < > 4.0 4.5 4.3  CL 101  --  100  --   --   CO2 25  < > '25 27 29  ' GLUCOSE 97  < > 93 93 91  BUN 14  < > 11 14.2 18.3  CREATININE 0.92  < > 0.83 0.9 0.9  CALCIUM 9.4  < > 9.8 9.7 10.0  GFRNONAA 62  --  70  --   --   GFRAA 72  --  81  --   --   PROT 6.8  < > 6.9 6.8 7.0  ALBUMIN 4.5  < > 4.2 3.8 3.9  AST 20  < > '20 19 18  ' ALT 15  < > '16 15 14  ' ALKPHOS 60  < > 68 68 69  BILITOT 0.6  < > 0.4 0.49 0.35  BILIDIR 0.15  --  0.14  --   --   < > = values in this interval not displayed. PATHOLOGY REPORT: Diagnosis 04/25/2015 1. Breast, lumpectomy, left - INVASIVE LOBULAR CARCINOMA, SEE COMMENT. 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 - PREVIOUS BIOPSY SITE. - SEE TUMOR SYNOPTIC TEMPLATE BELOW. 2. Lymph node,  sentinel, biopsy, left - ONE LYMPH NODE, NEGATIVE FOR TUMOR (0/1) SEE COMMENT. 3. Lymph node, sentinel, biopsy, left - ONE LYMPH NODE, NEGATIVE FOR TUMOR (  0/1) SEE COMMENT. 4. Lymph node, sentinel, biopsy, left - ONE LYMPH NODE, NEGATIVE FOR TUMOR (0/1) SEE COMMENT. Specimen, including laterality and lymph node sampling (sentinel, non-sentinel): Left breat with sentinel lymph node sampling. Procedure: Lumpectomy. Histologic type: Lobular. Grade: 1 of 3 Tubule formation: 3 Nuclear pleomorphism: 1 Mitotic:1 Tumor size (gross measurement): 1.8 cm Margins: Invasive, distance to closest margin: 0.5 cm (anterior) In-situ, distance to closest margin: N/A If margin positive, focally or broadly: N/A Lymphovascular invasion: Absent. Ductal carcinoma in situ: Absent. Grade: Extensive. Extensive intraductal component: N/A Lobular neoplasia: Present. Tumor focality: Unifocal. Treatment effect: None. If present, treatment effect in breast tissue, lymph nodes or both: N/A Extent of tumor: Skin: N/A Nipple: N/A Skeletal muscle: N/A Lymph nodes: Examined: 3 Sentinel 0 Non-sentinel 3 Total Lymph nodes with metastasis: 0 Isolated tumor cells (< 0.2 mm): N/A Micrometastasis: (> 0.2 mm and < 2.0 mm): N/A Macrometastasis: (> 2.0 mm): N/A Extracapsular extension: N/A Breast prognostic profile: Estrogen receptor: Not repeated, previous study demonstrated 100% positivity, [id number]. Progesterone receptor: Not repeated, previous study demonstrated 50% positivity, [id number]. Her 2 neu: Repeated, previous study demonstrated no amplification, [id number]. Ki-67: Not repeated, previous study demonstrated 5% proliferation rate. Non-neoplastic breast: Previous biopsy site, fibrocystic change, benign radial scar, calcifications. TNM: pT1c, pN0, pMX Comments: None. 2. - 4. There are no intranodal malignant epithelial tumor deposits identified on routine histology or with cytokeratin AE1/3  immunostains. (CRR:ecj 04/26/2015)  Results: HER2 - NEGATIVE RATIO OF HER2/CEP17 SIGNALS 1.69 AVERAGE HER2 COPY NUMBER PER CELL 2.45  Oncotype RS 15  RADIOGRAPHIC STUDIES: I have personally reviewed the radiological images as listed and agreed with the findings in the report.  No new scans    ASSESSMENT & PLAN: 75 year old Caucasian female, postmenopausal, presented with screening detected left breast cancer.  1. Left breast invasive lobular carcinoma, pT1cN0M0, stage IA, ER/PR positive, HER-2 negative, Ki-67 5%, Oncotype low risk -I previously reviewed her surgical pathology findings with her and her husband in great detail. -She had a complete surgical resection, margins were negative, sentinel lymph nodes were negative, she has low-grade stage I breast cancer, likely cured by surgery alone. -We discussed the small risk of cancer recurrence after her surgery. I reviewed her Oncotype DX test results, the recurrence score is 15, which predicts 10% 10 year distant recurrence with tamoxifen alone. -she is currently on adjuvant anastrozole, has developed moderate hot flush and leg cramps, will continue anastrozole for now -we'll continue breast cancer surveillance with annual mammogram, self-exam, and routine follow-up with [place]. She is due for mammogram in early August   2. Osteopenia -She hada bone density scan done in April 2015, which showed osteopenia with T score of -1.9 - I encouraged her to continue vitamin D, and add on calcium  -We discussed that the national may potentially make her osteopenia worse.  -We'll follow-up her bone density scan every 2 years.  3. Parkinson disease, hypertension -She'll continue follow-up with her primary care physician  4. Hot flush  -Secondary to anastrozole -Her hot flashes moderate, but bothersome. We discussed medical treatment for hot flash, with SSRI or Neurontin. Due to her Parkinson disease, on Azilect, which may have interaction with SSRI.   I'll discuss with her neurologist to see if is okay to use SSRI or neurontin.   5. Leg cramps  -I encouraged her to drink more water, avoid dehydration, adequate calcium and continue magnesium  Plan -continue anastrozole -I will call Dr. Bjorn Loser if see if she is OK to use  SSRI or neurontin.  -Return in 3 month for follow-up and lab, I ordered screening diagnostic bilateral mammogram in early August  All questions were answered. The patient knows to call the clinic with any problems, questions or concerns.  I spent 20 minutes counseling the patient face to face. The total time spent in the appointment was 25 minutes and more than 50% was on counseling.     Truitt Merle, MD 12/26/2015 2:54 PM

## 2016-01-04 ENCOUNTER — Telehealth: Payer: Self-pay | Admitting: *Deleted

## 2016-01-04 ENCOUNTER — Other Ambulatory Visit: Payer: Self-pay | Admitting: *Deleted

## 2016-01-04 DIAGNOSIS — C50412 Malignant neoplasm of upper-outer quadrant of left female breast: Secondary | ICD-10-CM

## 2016-01-04 MED ORDER — SERTRALINE HCL 50 MG PO TABS: 50.0000 mg | ORAL_TABLET | Freq: Every day | ORAL | Status: DC

## 2016-01-04 NOTE — Telephone Encounter (Signed)
Spoke with pt and informed pt re:  Dr. Hyman Bower responded back to Dr. Burr Medico -  Yes - OK for low/moderated dose SSRI.  No problem with Neurontin.   Informed pt that script for Zoloft will be sent in to pt's [place] .   Pt voiced understanding.

## 2016-01-24 ENCOUNTER — Telehealth: Payer: Self-pay | Admitting: *Deleted

## 2016-01-24 NOTE — Telephone Encounter (Signed)
Patient called and left message wondering if she could take estroven for her hot flashes.   Discussed with Chestine Spore NP who reviewed her medications and diagnosis list.  Called patient back and let her know that she should be fine to try that for her hot flashes.

## 2016-02-20 ENCOUNTER — Ambulatory Visit: Payer: Medicare Other | Admitting: Family Medicine

## 2016-02-25 ENCOUNTER — Ambulatory Visit (INDEPENDENT_AMBULATORY_CARE_PROVIDER_SITE_OTHER): Payer: Medicare Other | Admitting: Family Medicine

## 2016-02-25 ENCOUNTER — Encounter: Payer: Self-pay | Admitting: Family Medicine

## 2016-02-25 VITALS — BP 158/76 | HR 78 | Temp 97.9°F | Ht 62.0 in | Wt 155.0 lb

## 2016-02-25 DIAGNOSIS — G20 Parkinson's disease: Secondary | ICD-10-CM

## 2016-02-25 DIAGNOSIS — E559 Vitamin D deficiency, unspecified: Secondary | ICD-10-CM | POA: Diagnosis not present

## 2016-02-25 DIAGNOSIS — C50412 Malignant neoplasm of upper-outer quadrant of left female breast: Secondary | ICD-10-CM | POA: Diagnosis not present

## 2016-02-25 DIAGNOSIS — N951 Menopausal and female climacteric states: Secondary | ICD-10-CM | POA: Diagnosis not present

## 2016-02-25 DIAGNOSIS — I10 Essential (primary) hypertension: Secondary | ICD-10-CM | POA: Diagnosis not present

## 2016-02-25 DIAGNOSIS — R232 Flushing: Secondary | ICD-10-CM

## 2016-02-25 DIAGNOSIS — E785 Hyperlipidemia, unspecified: Secondary | ICD-10-CM

## 2016-02-25 MED ORDER — LISINOPRIL 10 MG PO TABS: 10.0000 mg | ORAL_TABLET | Freq: Every day | ORAL | Status: DC

## 2016-02-25 NOTE — Patient Instructions (Addendum)
Medicare Annual Wellness Visit  [facility] and the medical providers at [facility] strive to bring you the best medical care.  In doing so we not only want to address your current medical conditions and concerns but also to detect new conditions early and prevent illness, disease and health-related problems.    Medicare offers a yearly Wellness Visit which allows our clinical staff to assess your need for preventative services including immunizations, lifestyle education, counseling to decrease risk of preventable diseases and screening for fall risk and other medical concerns.    This visit is provided free of charge (no copay) for all Medicare recipients. The clinical pharmacists at [facility] have begun to conduct these Wellness Visits which will also include a thorough review of all your medications.    As you primary medical provider recommend that you make an appointment for your Annual Wellness Visit if you have not done so already this year.  You may set up this appointment before you leave today or you may call back [phone]) and schedule an appointment.  Please make sure when you call that you mention that you are scheduling your Annual Wellness Visit with the clinical pharmacist so that the appointment may be made for the proper length of time.     Continue current medications. Continue good therapeutic lifestyle changes which include good diet and exercise. Fall precautions discussed with patient. If an FOBT was given today- please return it to our front desk. If you are over 74 years old - you may need Prevnar 32 or the adult Pneumonia vaccine.   After your visit with [place] today you will receive a survey in the mail or online from [employer] regarding your care with [place]. Please take a moment to fill this out. Your feedback is very important to [place] as you can help [place] better understand your patient needs as well as  improve your experience and satisfaction. WE CARE ABOUT YOU!!!    Continue to follow-up with neurology and oncology Get mammogram as planned Stay active physically Always be careful do not put yourself at risk for falling We will call with lab work results as soon as these results become available and this includes the urinalysis. Watch diet more closely and take Zantac or ranitidine as needed Take as little Benadryl as possible

## 2016-02-25 NOTE — Progress Notes (Signed)
Subjective:    Patient ID: Tracey Juarez, female    DOB: 1941-01-01, 75 y.o.   MRN: [id number]  HPI Pt here for follow up and management of chronic medical problems which includes parkinson's and hypertension. She is taking medications regularly.The patient is complaining of hot flashes and has urinary urgency. The patient has a history of breast cancer, hypertension and Parkinson's disease. The patient complains of more stiffness in the right lower extremity. She will discuss this with her neurologist at the next visit. She is also having a lot of hot flashes and thinks this is most likely related to the medicine she is taking for her breast cancer. She is also complaining of some urinary urgency but has stopped taking her medication for overactive bladder because it seemed to interact with the medication that she takes for her overactive bladder and made her feel bad. She denies any chest pain or shortness of breath. She has no trouble with heartburn indigestion nausea vomiting diarrhea or blood in the stool. She is passing her water frequently but with no burning. Because of her Parkinson's she does exercise 3 times weekly and is involved and other physical activities to try to help her Parkinson's.    Patient Active Problem List   Diagnosis Date Noted  . Breast cancer of upper-outer quadrant of left female breast ([place]) 03/20/2015  . Osteopenia 08/15/2013  . Parkinson disease ([place])   . Hypertension   . Atrophic vaginitis   . Detrusor instability   . Melanoma ([place])   . Mixed basal-squamous cell carcinoma    Outpatient Encounter Prescriptions as of 02/25/2016  Medication Sig  . anastrozole (ARIMIDEX) 1 MG tablet Take 1 tablet (1 mg total) by mouth daily.  . AZILECT 1 MG TABS tablet Take 1 tablet by mouth daily.  . Cholecalciferol (VITAMIN D) 2000 UNITS CAPS Take 1 capsule by mouth daily.  . clopidogrel (PLAVIX) 75 MG tablet Take 75 mg by mouth daily.  . diphenhydrAMINE (BENADRYL) 25 mg  capsule Take 25 mg by mouth every 6 (six) hours as needed (for parkinson's symptoms).   Marland Kitchen lisinopril (PRINIVIL,ZESTRIL) 10 MG tablet Take 1 tablet (10 mg total) by mouth daily.  . Magnesium 250 MG TABS Take 1 tablet by mouth daily.  . meclizine (ANTIVERT) 25 MG tablet TAKE TWO TABLETS BY MOUTH THREE TIMES DAILY AS NEEDED  . Multiple Vitamin (MULTIVITAMIN) capsule Take 1 capsule by mouth daily.    . ranitidine (ZANTAC) 150 MG tablet Take 150 mg by mouth 2 (two) times daily as needed.   . [DISCONTINUED] lisinopril (PRINIVIL,ZESTRIL) 10 MG tablet Take 1 tablet (10 mg total) by mouth daily.  . [DISCONTINUED] oxybutynin (DITROPAN) 5 MG tablet Take 1 tablet (5 mg total) by mouth 2 (two) times daily.  . [DISCONTINUED] rosuvastatin (CRESTOR) 20 MG tablet Take 1 tablet (20 mg total) by mouth daily. (Patient not taking: Reported on 11/14/2015)  . [DISCONTINUED] sertraline (ZOLOFT) 50 MG tablet Take 1 tablet (50 mg total) by mouth daily.   No facility-administered encounter medications on file as of 02/25/2016.      Review of Systems  Constitutional: Negative.   HENT: Negative.   Eyes: Negative.   Respiratory: Negative.   Cardiovascular: Negative.   Gastrointestinal: Negative.   Endocrine: Negative.        Hot flashes  Genitourinary: Positive for urgency.  Musculoskeletal: Negative.   Skin: Negative.   Allergic/Immunologic: Negative.   Neurological: Negative.   Hematological: Negative.   Psychiatric/Behavioral: Negative.  Objective:   Physical Exam  Constitutional: She is oriented to person, place, and time. She appears well-developed and well-nourished. No distress.  Pleasant and alert with right-handed tremor  HENT:  Head: Normocephalic and atraumatic.  Right Ear: External ear normal.  Left Ear: External ear normal.  Nose: Nose normal.  Mouth/Throat: Oropharynx is clear and moist.  Eyes: Conjunctivae and EOM are normal. Pupils are equal, round, and reactive to light. Right eye  exhibits no discharge. Left eye exhibits no discharge. No scleral icterus.  Neck: Normal range of motion. Neck supple. No thyromegaly present.  No bruits thyromegaly or anterior cervical adenopathy  Cardiovascular: Normal rate, regular rhythm, normal heart sounds and intact distal pulses.  Exam reveals no gallop and no friction rub.   No murmur heard. The heart is regular at 72/m  Pulmonary/Chest: Effort normal and breath sounds normal. No respiratory distress. She has no wheezes. She has no rales. She exhibits no tenderness.  Abdominal: Soft. Bowel sounds are normal. She exhibits no mass. There is no tenderness. There is no rebound and no guarding.  No abdominal tenderness masses or bruits  Musculoskeletal: Normal range of motion. She exhibits no edema.  Lymphadenopathy:    She has no cervical adenopathy.  Neurological: She is alert and oriented to person, place, and time. She has normal reflexes. No cranial nerve deficit.  Some stiffness in the right arm and shoulder and tremor in the right hand  Skin: Skin is warm and dry. No rash noted.  Psychiatric: She has a normal mood and affect. Her behavior is normal. Judgment and thought content normal.  Pleasant and alert  Nursing note and vitals reviewed.  BP 152/77 mmHg  Pulse 78  Temp(Src) 97.9 F (36.6 C) (Oral)  Ht _0  (1.575 m)  Wt 155 lb (70.308 kg)  BMI 28.34 kg/m2        Assessment & Plan:  1. Essential hypertension -The blood pressure is elevated. She will continue to watch her diet and restrict sodium as much as possible. She'll stay active physically. We will ask her to bring some home blood pressure readings in for review in 4 weeks and if the blood pressure remains elevated we will increase her lisinopril to 20 mg daily. - BMP8+EGFR - CBC with Differential/Platelet - Hepatic function panel  2. Hyperlipemia -The patient had to stop her Crestor due to leg cramps and muscle aches. She has been statin intolerant. In the  mean time she will continue with aggressive therapeutic lifestyle changes. - CBC with Differential/Platelet - Lipid panel  3. Vitamin D deficiency -Continue with current treatment pending results of lab work - CBC with Differential/Platelet - VITAMIN D 25 Hydroxy (Vit-D Deficiency, Fractures)  4. Parkinson disease ([place]) -Continue to follow-up with neurology - CBC with Differential/Platelet  5. Breast cancer of upper-outer quadrant of left female breast ([place]) -Continue to follow-up with oncology  6. Hot flashes -Discuss the symptoms with the oncologist at the next visit  Meds ordered this encounter  Medications  . lisinopril (PRINIVIL,ZESTRIL) 10 MG tablet    Sig: Take 1 tablet (10 mg total) by mouth daily.    Dispense:  90 tablet    Refill:  1   Patient Instructions                       Medicare Annual Wellness Visit  [place] and the medical providers at [facility] strive to bring you the best medical care.  In  doing so we not only want to address your current medical conditions and concerns but also to detect new conditions early and prevent illness, disease and health-related problems.    Medicare offers a yearly Wellness Visit which allows our clinical staff to assess your need for preventative services including immunizations, lifestyle education, counseling to decrease risk of preventable diseases and screening for fall risk and other medical concerns.    This visit is provided free of charge (no copay) for all Medicare recipients. The clinical pharmacists at [facility] have begun to conduct these Wellness Visits which will also include a thorough review of all your medications.    As you primary medical provider recommend that you make an appointment for your Annual Wellness Visit if you have not done so already this year.  You may set up this appointment before you leave today or you may call back ([phone]) and schedule  an appointment.  Please make sure when you call that you mention that you are scheduling your Annual Wellness Visit with the clinical pharmacist so that the appointment may be made for the proper length of time.     Continue current medications. Continue good therapeutic lifestyle changes which include good diet and exercise. Fall precautions discussed with patient. If an FOBT was given today- please return it to our front desk. If you are over 60 years old - you may need Prevnar 15 or the adult Pneumonia vaccine.   After your visit with [place] today you will receive a survey in the mail or online from [employer] regarding your care with [place]. Please take a moment to fill this out. Your feedback is very important to [place] as you can help [place] better understand your patient needs as well as improve your experience and satisfaction. WE CARE ABOUT YOU!!!    Continue to follow-up with neurology and oncology Get mammogram as planned Stay active physically Always be careful do not put yourself at risk for falling We will call with lab work results as soon as these results become available and this includes the urinalysis. Watch diet more closely and take Zantac or ranitidine as needed Take as little Benadryl as possible   Arrie Senate MD

## 2016-02-26 ENCOUNTER — Telehealth: Payer: Self-pay | Admitting: Family Medicine

## 2016-02-26 LAB — CBC WITH DIFFERENTIAL/PLATELET
BASOS: 0 %
Basophils Absolute: 0 10*3/uL (ref 0.0–0.2)
EOS (ABSOLUTE): 0.1 10*3/uL (ref 0.0–0.4)
Eos: 2 %
Hematocrit: 40.7 % (ref 34.0–46.6)
Hemoglobin: 13.3 g/dL (ref 11.1–15.9)
IMMATURE GRANS (ABS): 0 10*3/uL (ref 0.0–0.1)
Immature Granulocytes: 0 %
LYMPHS: 23 %
Lymphocytes Absolute: 1.3 10*3/uL (ref 0.7–3.1)
MCH: 30.4 pg (ref 26.6–33.0)
MCHC: 32.7 g/dL (ref 31.5–35.7)
MCV: 93 fL (ref 79–97)
MONOS ABS: 0.7 10*3/uL (ref 0.1–0.9)
Monocytes: 12 %
NEUTROS ABS: 3.6 10*3/uL (ref 1.4–7.0)
Neutrophils: 63 %
PLATELETS: 254 10*3/uL (ref 150–379)
RBC: 4.37 x10E6/uL (ref 3.77–5.28)
RDW: 13.7 % (ref 12.3–15.4)
WBC: 5.7 10*3/uL (ref 3.4–10.8)

## 2016-02-26 LAB — BMP8+EGFR
BUN / CREAT RATIO: 16 (ref 12–28)
BUN: 14 mg/dL (ref 8–27)
CALCIUM: 10 mg/dL (ref 8.7–10.3)
CHLORIDE: 100 mmol/L (ref 96–106)
CO2: 25 mmol/L (ref 18–29)
CREATININE: 0.85 mg/dL (ref 0.57–1.00)
GFR calc non Af Amer: 68 mL/min/{1.73_m2} (ref >59)
GFR, EST AFRICAN AMERICAN: 79 mL/min/{1.73_m2} (ref >59)
Glucose: 92 mg/dL (ref 65–99)
Potassium: 4.3 mmol/L (ref 3.5–5.2)
Sodium: 142 mmol/L (ref 134–144)

## 2016-02-26 LAB — HEPATIC FUNCTION PANEL
ALT: 12 IU/L (ref 0–32)
AST: 17 IU/L (ref 0–40)
Albumin: 4.5 g/dL (ref 3.5–4.8)
Alkaline Phosphatase: 80 IU/L (ref 39–117)
BILIRUBIN, DIRECT: 0.13 mg/dL (ref 0.00–0.40)
Bilirubin Total: 0.4 mg/dL (ref 0.0–1.2)
Total Protein: 7.1 g/dL (ref 6.0–8.5)

## 2016-02-26 LAB — LIPID PANEL
Chol/HDL Ratio: 3.7 ratio units (ref 0.0–4.4)
Cholesterol, Total: 243 mg/dL — ABNORMAL HIGH (ref 100–199)
HDL: 66 mg/dL (ref >39)
LDL Calculated: 153 mg/dL — ABNORMAL HIGH (ref 0–99)
Triglycerides: 119 mg/dL (ref 0–149)
VLDL Cholesterol Cal: 24 mg/dL (ref 5–40)

## 2016-02-26 LAB — VITAMIN D 25 HYDROXY (VIT D DEFICIENCY, FRACTURES): VIT D 25 HYDROXY: 33.5 ng/mL (ref 30.0–100.0)

## 2016-02-26 MED ORDER — MIRABEGRON ER 25 MG PO TB24: 25.0000 mg | ORAL_TABLET | Freq: Every day | ORAL | Status: DC

## 2016-02-26 NOTE — Telephone Encounter (Signed)
Pt aware of test results but she says you were going to give her something for urinary urgency.

## 2016-02-26 NOTE — Telephone Encounter (Signed)
Start Myrbetriq and she should get back in touch with [place] to let us know how it is helping

## 2016-02-26 NOTE — Telephone Encounter (Signed)
Pt aware.

## 2016-03-19 ENCOUNTER — Ambulatory Visit (INDEPENDENT_AMBULATORY_CARE_PROVIDER_SITE_OTHER): Payer: Medicare Other | Admitting: Family Medicine

## 2016-03-19 ENCOUNTER — Encounter: Payer: Self-pay | Admitting: Family Medicine

## 2016-03-19 VITALS — BP 133/66 | Temp 97.4°F | Ht 62.0 in | Wt 154.8 lb

## 2016-03-19 DIAGNOSIS — M65312 Trigger thumb, left thumb: Secondary | ICD-10-CM

## 2016-03-19 MED ORDER — METHYLPREDNISOLONE ACETATE 80 MG/ML IJ SUSP: 40.0000 mg | Freq: Once | INTRAMUSCULAR | Status: DC

## 2016-03-19 NOTE — Progress Notes (Signed)
BP 133/66 (BP Location: Right Arm, Patient Position: Sitting, Cuff Size: Large)   Temp 97.4 F (36.3 C) (Oral)   Ht 5\' 2"  (1.575 m)   Wt 154 lb 12.8 oz (70.2 kg)   BMI 28.31 kg/m    Subjective:    Patient ID: Tracey Juarez, female    DOB: 01-Nov-1940, 75 y.o.   MRN: [id number]  HPI: Tracey Juarez is a 75 y.o. female presenting on 03/19/2016 for left thumb (trigger thumb)   HPI Thumb pain Patient has been having pain in her left thumb near the MCP joint for the past 2 weeks. The previous injection that she had for months ago gave her a lot of relief until now. She is also been having some catching and popping of that thumb that is been worsening over the past couple weeks. She denies any overlying skin changes or fevers or chills. She has not had this previously.   Relevant past medical, surgical, family and social history reviewed and updated as indicated. Interim medical history since our last visit reviewed. Allergies and medications reviewed and updated.  Review of Systems  Constitutional: Negative for chills and fever.  HENT: Negative for congestion, ear discharge and ear pain.   Eyes: Negative for redness and visual disturbance.  Respiratory: Negative for chest tightness and shortness of breath.   Cardiovascular: Negative for chest pain and leg swelling.  Genitourinary: Negative for difficulty urinating and dysuria.  Musculoskeletal: Positive for arthralgias. Negative for back pain, gait problem and joint swelling.  Skin: Negative for rash.  Neurological: Negative for light-headedness and headaches.  Psychiatric/Behavioral: Negative for agitation and behavioral problems.  All other systems reviewed and are negative.   Per HPI unless specifically indicated above     Medication List       Accurate as of 03/19/16  1:17 PM. Always use your most recent med list.          anastrozole 1 MG tablet Commonly known as:  ARIMIDEX Take 1 tablet (1 mg total) by mouth  daily.   AZILECT 1 MG Tabs tablet Generic drug:  rasagiline Take 1 tablet by mouth daily.   clopidogrel 75 MG tablet Commonly known as:  PLAVIX Take 75 mg by mouth daily.   diphenhydrAMINE 25 mg capsule Commonly known as:  BENADRYL Take 25 mg by mouth every 6 (six) hours as needed (for parkinson's symptoms).   lisinopril 10 MG tablet Commonly known as:  PRINIVIL,ZESTRIL Take 1 tablet (10 mg total) by mouth daily.   Magnesium 250 MG Tabs Take 1 tablet by mouth daily.   meclizine 25 MG tablet Commonly known as:  ANTIVERT TAKE TWO TABLETS BY MOUTH THREE TIMES DAILY AS NEEDED   mirabegron ER 25 MG Tb24 tablet Commonly known as:  MYRBETRIQ Take 1 tablet (25 mg total) by mouth daily.   multivitamin capsule Take 1 capsule by mouth daily.   ranitidine 150 MG tablet Commonly known as:  ZANTAC Take 150 mg by mouth 2 (two) times daily as needed.   Vitamin D 2000 units Caps Take 1 capsule by mouth daily.          Objective:    BP 133/66 (BP Location: Right Arm, Patient Position: Sitting, Cuff Size: Large)   Temp 97.4 F (36.3 C) (Oral)   Ht 5\' 2"  (1.575 m)   Wt 154 lb 12.8 oz (70.2 kg)   BMI 28.31 kg/m   Wt Readings from Last 3 Encounters:  03/19/16 154 lb 12.8 oz (70.2  kg)  02/25/16 155 lb (70.3 kg)  12/26/15 156 lb 12.8 oz (71.1 kg)    Physical Exam  Constitutional: She is oriented to person, place, and time. She appears well-developed and well-nourished. No distress.  Eyes: Conjunctivae and EOM are normal. Pupils are equal, round, and reactive to light.  Cardiovascular: Normal rate, regular rhythm, normal heart sounds and intact distal pulses.   No murmur heard. Pulmonary/Chest: Effort normal and breath sounds normal. No respiratory distress. She has no wheezes.  Musculoskeletal: Normal range of motion. She exhibits no edema or tenderness.       Hands: Neurological: She is alert and oriented to person, place, and time. Coordination normal.  Skin: Skin is warm  and dry. No rash noted. She is not diaphoretic.  Psychiatric: She has a normal mood and affect. Her behavior is normal.  Nursing note and vitals reviewed.  Trigger finger injection: Risk factors of bleeding and infection discussed with patient and patient is agreeable towards injection. Patient prepped with Betadine. Injected directly around tendon sheath. Injected 40 mg of Depo-Medrol and 0.5 mL of 2% lidocaine. Patient tolerated procedure well and no side effects from noted. Minimal to no bleeding. Simple bandage applied after.    Assessment & Plan:   Problem List Items Addressed This Visit    None    Visit Diagnoses    Trigger thumb of left hand    -  Primary    Recurred after almost 4 months. Patient would like to do another injection and then discuss orthopedic in the future   Relevant Medications   methylPREDNISolone acetate (DEPO-MEDROL) injection 40 mg       Follow up plan: Return if symptoms worsen or fail to improve.  Counseling provided for all of the vaccine components No orders of the defined types were placed in this encounter.   Caryl Pina, MD [facility] 03/19/2016, 1:17 PM

## 2016-03-25 ENCOUNTER — Ambulatory Visit
Admission: RE | Admit: 2016-03-25 | Discharge: 2016-03-25 | Disposition: A | Discharge status: Home / Self Care | Payer: Medicare Other | Source: Ambulatory Visit | Attending: Hematology | Admitting: Hematology

## 2016-03-25 DIAGNOSIS — N63 Unspecified lump in breast: Secondary | ICD-10-CM | POA: Diagnosis not present

## 2016-03-25 DIAGNOSIS — C50412 Malignant neoplasm of upper-outer quadrant of left female breast: Secondary | ICD-10-CM

## 2016-04-10 ENCOUNTER — Telehealth: Payer: Self-pay | Admitting: Hematology

## 2016-04-10 NOTE — Telephone Encounter (Signed)
PATIENT CALLED TO [facility] APPT. 04/10/16

## 2016-04-15 ENCOUNTER — Other Ambulatory Visit: Payer: Medicare Other

## 2016-04-15 ENCOUNTER — Ambulatory Visit: Payer: Medicare Other | Admitting: Hematology

## 2016-04-29 DIAGNOSIS — Z85828 Personal history of other malignant neoplasm of skin: Secondary | ICD-10-CM | POA: Diagnosis not present

## 2016-04-29 DIAGNOSIS — L570 Actinic keratosis: Secondary | ICD-10-CM | POA: Diagnosis not present

## 2016-04-30 ENCOUNTER — Ambulatory Visit (HOSPITAL_BASED_OUTPATIENT_CLINIC_OR_DEPARTMENT_OTHER): Payer: Medicare Other | Admitting: Hematology

## 2016-04-30 ENCOUNTER — Encounter: Payer: Self-pay | Admitting: Hematology

## 2016-04-30 ENCOUNTER — Telehealth: Payer: Self-pay | Admitting: Hematology

## 2016-04-30 ENCOUNTER — Other Ambulatory Visit (HOSPITAL_BASED_OUTPATIENT_CLINIC_OR_DEPARTMENT_OTHER): Payer: Medicare Other

## 2016-04-30 VITALS — BP 147/67 | HR 71 | Temp 98.5°F | Resp 17 | Ht 62.0 in | Wt 157.3 lb

## 2016-04-30 DIAGNOSIS — C50412 Malignant neoplasm of upper-outer quadrant of left female breast: Secondary | ICD-10-CM

## 2016-04-30 DIAGNOSIS — G20 Parkinson's disease: Secondary | ICD-10-CM | POA: Diagnosis not present

## 2016-04-30 DIAGNOSIS — Z170 Estrogen receptor positive status [ER+]: Secondary | ICD-10-CM | POA: Diagnosis not present

## 2016-04-30 DIAGNOSIS — I10 Essential (primary) hypertension: Secondary | ICD-10-CM | POA: Diagnosis not present

## 2016-04-30 DIAGNOSIS — C4499 Other specified malignant neoplasm of skin, unspecified: Secondary | ICD-10-CM

## 2016-04-30 DIAGNOSIS — Z79811 Long term (current) use of aromatase inhibitors: Secondary | ICD-10-CM | POA: Diagnosis not present

## 2016-04-30 DIAGNOSIS — M8580 Other specified disorders of bone density and structure, unspecified site: Secondary | ICD-10-CM

## 2016-04-30 LAB — CBC WITH DIFFERENTIAL/PLATELET
BASO%: 0.4 % (ref 0.0–2.0)
Basophils Absolute: 0 10*3/uL (ref 0.0–0.1)
EOS ABS: 0.3 10*3/uL (ref 0.0–0.5)
EOS%: 6 % (ref 0.0–7.0)
HCT: 40.4 % (ref 34.8–46.6)
HGB: 13.4 g/dL (ref 11.6–15.9)
LYMPH%: 22.6 % (ref 14.0–49.7)
MCH: 30.7 pg (ref 25.1–34.0)
MCHC: 33.2 g/dL (ref 31.5–36.0)
MCV: 92.7 fL (ref 79.5–101.0)
MONO#: 0.7 10*3/uL (ref 0.1–0.9)
MONO%: 13.4 % (ref 0.0–14.0)
NEUT%: 57.6 % (ref 38.4–76.8)
NEUTROS ABS: 2.9 10*3/uL (ref 1.5–6.5)
Platelets: 216 10*3/uL (ref 145–400)
RBC: 4.36 10*6/uL (ref 3.70–5.45)
RDW: 13.3 % (ref 11.2–14.5)
WBC: 5 10*3/uL (ref 3.9–10.3)
lymph#: 1.1 10*3/uL (ref 0.9–3.3)

## 2016-04-30 LAB — COMPREHENSIVE METABOLIC PANEL
ALK PHOS: 92 U/L (ref 40–150)
ALT: 17 U/L (ref 0–55)
ANION GAP: 9 meq/L (ref 3–11)
AST: 19 U/L (ref 5–34)
Albumin: 3.5 g/dL (ref 3.5–5.0)
BUN: 14.8 mg/dL (ref 7.0–26.0)
CALCIUM: 9.8 mg/dL (ref 8.4–10.4)
CHLORIDE: 106 meq/L (ref 98–109)
CO2: 26 mEq/L (ref 22–29)
Creatinine: 0.9 mg/dL (ref 0.6–1.1)
EGFR: 61 mL/min/{1.73_m2} — ABNORMAL LOW (ref >90)
Glucose: 95 mg/dl (ref 70–140)
POTASSIUM: 4.2 meq/L (ref 3.5–5.1)
Sodium: 142 mEq/L (ref 136–145)
Total Bilirubin: 0.5 mg/dL (ref 0.20–1.20)
Total Protein: 7 g/dL (ref 6.4–8.3)

## 2016-04-30 MED ORDER — ANASTROZOLE 1 MG PO TABS: 1.0000 mg | ORAL_TABLET | Freq: Every day | ORAL | 1 refills | Status: DC

## 2016-04-30 NOTE — Telephone Encounter (Signed)
Gave patient avs report and appointments for February.  °

## 2016-04-30 NOTE — Progress Notes (Addendum)
[place]  [phone] [phone]  Clinic Follow Up Note   Patient Care Team: Chipper Herb, MD as PCP - General (Family Medicine) Anastasio Auerbach, MD as Consulting Physician (Gynecology) Erroll Luna, MD as Consulting Physician (General Surgery) Truitt Merle, MD as Consulting Physician (Hematology) Thea Silversmith, MD as Consulting Physician (Radiation Oncology) Mauro Kaufmann, RN as Registered Nurse Rockwell Germany, RN as Registered Nurse Sylvan Cheese, NP as Nurse Practitioner (Nurse Practitioner) Freeman Caldron. Bjorn Loser, MD as Referring Physician (Neurology) 04/30/2016  CHIEF COMPLAINTS:  Follow Up left breast cancer  Oncology History   Breast cancer of upper-outer quadrant of left female breast [facility])   Staging form: Breast, AJCC 7th Edition     Clinical stage from 03/19/2015: Stage IIA (T2, N0, M0) - Signed by Truitt Merle, MD on 03/27/2015     Pathologic stage from 04/27/2015: Stage IA (T1c, N0, cM0) - Signed by Enid Cutter, MD on 05/01/2015       Staging comments: Staged on final surgical specimen by Dr. Donato Heinz.          Breast cancer of upper-outer quadrant of left female breast ([place])   03/06/2015 Mammogram    Hypoechoic area in the left breast 1:00 position, 3 cm from the nipple, measuring 1.9 x 1.1 x 1.2 cm, corresponding to an area of architectural distortion on mammogram.      03/19/2015 Initial Biopsy    Left breast core needle biopsy showed invasive lobular carcinoma, grade 1-2, lobular carcinoma in situ. ER 100% positive, PR 50% positive, HER-2 negative, Ki-67 5%.      03/22/2015 Breast MRI    2.1 x 1.2 x 1.7 cm irregular enhancement within the upper outer left breast compatible with biopsy-proven malignancy. No other abnormalities.      03/22/2015 Clinical Stage    Stage IIA: T2 N0      04/25/2015 Definitive Surgery    Left breast lumpectomy / SLNB: invasive lobular carcinoma, grade 1, margins were negative. LCIS; 3 sentinel  lymph nodes were negative (0/3), HER2/neu repeated and negative      04/25/2015 Pathologic Stage    Stage IA: T1c N0      04/25/2015 Oncotype testing    Recurrence score 15, which predicts 10% 10 year risk of distant recurrence with tamoxifen alone.      06/06/2015 - 06/27/2015 Radiation Therapy    Adjuvant RT: Left breast 42.72 Gy over 16 fractions      08/12/2015 -  Anti-estrogen oral therapy    Anastrozole 1 mg daily.      09/20/2015 Survivorship    Survivorship care plan completed and mailed to patient in lieu of in person visit at pt request.       HISTORY OF PRESENTING ILLNESS:  Tracey Juarez 75 y.o. female is here because of recently diagnosed left breast cancer. She presents to our multidisciplinary clinic with her husband.  This was discovered by screening mammogram, she did not feel the lump. She denies any skin or nipple change, no constitutional symptoms. The moment mammogram and ultrasound revealed a 1.9 cm mass at 1:00 position of left breast, core needle biopsy showed invasive lobular carcinoma. ER/PR stripe positive, HER-2 negative. She underwent breast MI after biopsy, which showed a 2.1 cm irregular enhancement mass in the upper outer left breast.   She has Parkinson disesease which diagnosed 5 years ago, mild, very physically active. She has no family history of breast or ovary cancer. She has no children.  CURRENT THERAPY: anastrozole 1 mg once daily, satarted on 06/11/2016  INTERIM HISTORY Mrs. Falotico returns for follow-up. She is Accompanied by her husband to clinic Today. She has been using estroven for her hot flush which helps. Her hot flashes more tolerable now. She otherwise is tolerating anastrozole very well, no significant joint or muscular discomfort, or other noticeable side effects. She feels well overall, has good appetite and energy level, her Parkinson's disease is stable. Weight is stable.  MEDICAL HISTORY:  Past Medical History:  Diagnosis Date    . Atrophic vaginitis   . Breast cancer of upper-outer quadrant of left female breast ([place]) 03/20/2015  . Detrusor instability   . Elevated cholesterol   . GERD (gastroesophageal reflux disease)   . Hot flashes   . Hypertension   . Melanoma ([place])   . Mixed basal-squamous cell carcinoma    skin  . Parkinson disease ([place])   . Stroke ([place])    on Plavix, no deficits    SURGICAL HISTORY: Past Surgical History:  Procedure Laterality Date  . BREAST LUMPECTOMY WITH RADIOACTIVE SEED AND SENTINEL LYMPH NODE BIOPSY Left 04/25/2015   Procedure: LEFT BREAST PARTIAL MASTECTOMY WITH RADIOACTIVE SEED AND SENTINEL LYMPH NODE MAPPING;  Surgeon: Erroll Luna, MD;  Location: [place];  Service: General;  Laterality: Left;  . FOOT SURGERY  2004  . KNEE SURGERY  2007  . skin cancer excised    . TONSILLECTOMY     GYN HISTORY  Menarchal: 11 LMP: 51 Contraceptive: no  HRT: 10 years G0P0:    SOCIAL HISTORY: Social History   Social History  . Marital Status: Married    Spouse Name: N/A  . Number of Children: No   . Years of Education: N/A   Occupational History  . Retired    Social History Main Topics  . Smoking status: Never Smoker   . Smokeless tobacco: Never Used  . Alcohol Use: No  . Drug Use: No  . Sexual Activity: No   Other Topics Concern  . Not on file   Social History Narrative    FAMILY HISTORY: Family History  Problem Relation Age of Onset  . Hypertension Mother   . Heart attack Mother   . Heart attack Maternal Aunt   . Heart attack Maternal Uncle     ALLERGIES:  has No Known Allergies.  MEDICATIONS:  Current Outpatient Prescriptions  Medication Sig Dispense Refill  . anastrozole (ARIMIDEX) 1 MG tablet Take 1 tablet (1 mg total) by mouth daily. 90 tablet 1  . AZILECT 1 MG TABS tablet Take 1 tablet by mouth daily.    . Cholecalciferol (VITAMIN D) 2000 UNITS CAPS Take 1 capsule by mouth daily.    . clopidogrel (PLAVIX) 75 MG tablet Take 75 mg  by mouth daily.    . diphenhydrAMINE (BENADRYL) 25 mg capsule Take 25 mg by mouth every 6 (six) hours as needed (for parkinson's symptoms).     Marland Kitchen lisinopril (PRINIVIL,ZESTRIL) 10 MG tablet Take 1 tablet (10 mg total) by mouth daily. 90 tablet 1  . Magnesium 250 MG TABS Take 1 tablet by mouth daily.    . meclizine (ANTIVERT) 25 MG tablet TAKE TWO TABLETS BY MOUTH THREE TIMES DAILY AS NEEDED 60 tablet 2  . mirabegron ER (MYRBETRIQ) 25 MG TB24 tablet Take 1 tablet (25 mg total) by mouth daily. 30 tablet 1  . Multiple Vitamin (MULTIVITAMIN) capsule Take 1 capsule by mouth daily.      . Nutritional Supplements (  ESTROVEN PO) Take by mouth daily. Patient not sure of dose    . ranitidine (ZANTAC) 150 MG tablet Take 150 mg by mouth 2 (two) times daily as needed.      Current Facility-Administered Medications  Medication Dose Route Frequency Provider Last Rate Last Dose  . methylPREDNISolone acetate (DEPO-MEDROL) injection 40 mg  40 mg Intra-Lesional Once Worthy Rancher, MD        REVIEW OF SYSTEMS:   Constitutional: Denies fevers, chills or abnormal night sweats Eyes: Denies blurriness of vision, double vision or watery eyes Ears, nose, mouth, throat, and face: Denies mucositis or sore throat Respiratory: Denies cough, dyspnea or wheezes Cardiovascular: Denies palpitation, chest discomfort or lower extremity swelling Gastrointestinal:  Denies nausea, heartburn or change in bowel habits Skin: Denies abnormal skin rashes Lymphatics: Denies new lymphadenopathy or easy bruising Neurological:Denies numbness, tingling or new weaknesses Behavioral/Psych: Mood is stable, no new changes  All other systems were reviewed with the patient and are negative.  PHYSICAL EXAMINATION: ECOG PERFORMANCE STATUS: 0 - Asymptomatic  Vitals:   04/30/16 0805  BP: (!) 147/67  Pulse: 71  Resp: 17  Temp: 98.5 F (36.9 C)   Filed Weights   04/30/16 0805  Weight: 157 lb 4.8 oz (71.4 kg)    GENERAL:alert, no  distress and comfortable SKIN: skin color, texture, turgor are normal, no rashes or significant lesions EYES: normal, conjunctiva are pink and non-injected, sclera clear OROPHARYNX:no exudate, no erythema and lips, buccal mucosa, and tongue normal  NECK: supple, thyroid normal size, non-tender, without nodularity LYMPH:  no palpable lymphadenopathy in the cervical, axillary or inguinal LUNGS: clear to auscultation and percussion with normal breathing effort HEART: regular rate & rhythm and no murmurs and no lower extremity edema ABDOMEN:abdomen soft, non-tender and normal bowel sounds Musculoskeletal:no cyanosis of digits and no clubbing  PSYCH: alert & oriented x 3 with fluent speech NEURO: no focal motor/sensory deficits Breasts: Breast inspection showed them to be symmetrical with no nipple discharge. Mild diffuse skin pigmentation at left breast, nontender, no palpable mass. Palpation of the right breast and axilla revealed no obvious mass that I could appreciate.   LABORATORY DATA:  I have reviewed the data as listed Lab Results  Component Value Date   WBC 5.0 04/30/2016   HGB 13.4 04/30/2016   HCT 40.4 04/30/2016   MCV 92.7 04/30/2016   PLT 216 04/30/2016    Recent Labs  08/22/15 1632 10/01/15 0953 12/26/15 1325 02/25/16 1434  NA 141 141 140 142  K 4.0 4.5 4.3 4.3  CL 100  --   --  100  CO2 '25 27 29 25  ' GLUCOSE 93 93 91 92  BUN 11 14.2 18.3 14  CREATININE 0.83 0.9 0.9 0.85  CALCIUM 9.8 9.7 10.0 10.0  GFRNONAA 70  --   --  68  GFRAA 81  --   --  79  PROT 6.9 6.8 7.0 7.1  ALBUMIN 4.2 3.8 3.9 4.5  AST '20 19 18 17  ' ALT '16 15 14 12  ' ALKPHOS 68 68 69 80  BILITOT 0.4 0.49 0.35 0.4  BILIDIR 0.14  --   --  0.13   PATHOLOGY REPORT: Diagnosis 04/25/2015 1. Breast, lumpectomy, left - INVASIVE LOBULAR CARCINOMA, SEE COMMENT. 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 - PREVIOUS BIOPSY SITE. - SEE TUMOR SYNOPTIC TEMPLATE BELOW. 2.  Lymph node, sentinel, biopsy, left - ONE LYMPH NODE, NEGATIVE FOR TUMOR (0/1) SEE COMMENT. 3. Lymph node, sentinel,  biopsy, left - ONE LYMPH NODE, NEGATIVE FOR TUMOR (0/1) SEE COMMENT. 4. Lymph node, sentinel, biopsy, left - ONE LYMPH NODE, NEGATIVE FOR TUMOR (0/1) SEE COMMENT. Specimen, including laterality and lymph node sampling (sentinel, non-sentinel): Left breat with sentinel lymph node sampling. Procedure: Lumpectomy. Histologic type: Lobular. Grade: 1 of 3 Tubule formation: 3 Nuclear pleomorphism: 1 Mitotic:1 Tumor size (gross measurement): 1.8 cm Margins: Invasive, distance to closest margin: 0.5 cm (anterior) In-situ, distance to closest margin: N/A If margin positive, focally or broadly: N/A Lymphovascular invasion: Absent. Ductal carcinoma in situ: Absent. Grade: Extensive. Extensive intraductal component: N/A Lobular neoplasia: Present. Tumor focality: Unifocal. Treatment effect: None. If present, treatment effect in breast tissue, lymph nodes or both: N/A Extent of tumor: Skin: N/A Nipple: N/A Skeletal muscle: N/A Lymph nodes: Examined: 3 Sentinel 0 Non-sentinel 3 Total Lymph nodes with metastasis: 0 Isolated tumor cells (< 0.2 mm): N/A Micrometastasis: (> 0.2 mm and < 2.0 mm): N/A Macrometastasis: (> 2.0 mm): N/A Extracapsular extension: N/A Breast prognostic profile: Estrogen receptor: Not repeated, previous study demonstrated 100% positivity, [id number]. Progesterone receptor: Not repeated, previous study demonstrated 50% positivity, [id number]. Her 2 neu: Repeated, previous study demonstrated no amplification, [id number]. Ki-67: Not repeated, previous study demonstrated 5% proliferation rate. Non-neoplastic breast: Previous biopsy site, fibrocystic change, benign radial scar, calcifications. TNM: pT1c, pN0, pMX Comments: None. 2. - 4. There are no intranodal malignant epithelial tumor deposits identified on routine histology or with  cytokeratin AE1/3 immunostains. (CRR:ecj 04/26/2015)  Results: HER2 - NEGATIVE RATIO OF HER2/CEP17 SIGNALS 1.69 AVERAGE HER2 COPY NUMBER PER CELL 2.45  Oncotype RS 15  RADIOGRAPHIC STUDIES: I have personally reviewed the radiological images as listed and agreed with the findings in the report.  MM Diag breast tomo bilateral 03/25/2016 IMPRESSION: No evidence of recurrent or new breast malignancy. Benign postsurgical changes on the left.  RECOMMENDATION: Diagnostic mammography in 1 year per standard post lumpectomy protocol.    ASSESSMENT & PLAN: 75 year old Caucasian female, postmenopausal, presented with screening detected left breast cancer.  1. Left breast invasive lobular carcinoma, pT1cN0M0, stage IA, ER/PR positive, HER-2 negative, Ki-67 5%, Oncotype low risk -I previously reviewed her surgical pathology findings with her and her husband in great detail. -She had a complete surgical resection, margins were negative, sentinel lymph nodes were negative, she has low-grade stage I breast cancer, likely cured by surgery alone. -We discussed the small risk of cancer recurrence after her surgery. I reviewed her Oncotype DX test results, the recurrence score is 15, which predicts 10% 10 year distant recurrence with tamoxifen alone. -she is currently on adjuvant anastrozole, has developed moderate hot flush and leg cramps, improved lately, will continue anastrozole for now -we'll continue breast cancer surveillance with annual mammogram, self-exam, and routine follow-up with [place].  -She is clinically doing well, recent mammogram was negative, lab and physical exam are unremarkable today, no evidence of recurrence. We'll continue surveillance.  2. Osteopenia -She hada bone density scan done in April 2015, which showed osteopenia with T score of -1.9 - I encouraged her to continue vitamin D, and add on calcium  -We discussed that the national may potentially make her osteopenia worse.   -We'll follow-up her bone density scan every 2 years.  3. Parkinson disease, hypertension -She'll continue follow-up with her primary care physician  4. Hot flush  -Secondary to anastrozole -I recommended SSRI for her hot flash, OK with her neurologist, but she declined -she is now on estroven (OTC) for hot flush, I will check with  our pharmacy to see if it contains any estrogen   5. Leg cramps  -I encouraged her to drink more water, avoid dehydration, adequate calcium and continue magnesium  Plan -continue anastrozole -Return in 4 month for follow-up and lab.   All questions were answered. The patient knows to call the clinic with any problems, questions or concerns.  I spent 20 minutes counseling the patient face to face. The total time spent in the appointment was 25 minutes and more than 50% was on counseling.     Truitt Merle, MD 04/30/2016   Addendum  Our pharmacist Vergia Alcon looked the Natural Medicines Database and found estroven contains Kudzu which might have estrogen effect. I recommend her not to use estroven. She agrees.  I recommend her to try zoloft, which was OK by her neurologist. She is concerned about the side effect, but agrees to try. She will let us know how it goes,   Truitt Merle  05/01/2016

## 2016-05-07 DIAGNOSIS — Z23 Encounter for immunization: Secondary | ICD-10-CM | POA: Diagnosis not present

## 2016-06-09 DIAGNOSIS — G20 Parkinson's disease: Secondary | ICD-10-CM | POA: Diagnosis not present

## 2016-07-09 ENCOUNTER — Ambulatory Visit (INDEPENDENT_AMBULATORY_CARE_PROVIDER_SITE_OTHER): Payer: Medicare Other | Admitting: Family Medicine

## 2016-07-09 ENCOUNTER — Encounter: Payer: Self-pay | Admitting: Family Medicine

## 2016-07-09 VITALS — BP 133/74 | HR 85 | Temp 98.2°F | Ht 62.0 in | Wt 158.1 lb

## 2016-07-09 DIAGNOSIS — M65312 Trigger thumb, left thumb: Secondary | ICD-10-CM

## 2016-07-09 DIAGNOSIS — M542 Cervicalgia: Secondary | ICD-10-CM | POA: Diagnosis not present

## 2016-07-09 DIAGNOSIS — M436 Torticollis: Secondary | ICD-10-CM

## 2016-07-09 MED ORDER — METHYLPREDNISOLONE ACETATE 80 MG/ML IJ SUSP
40.0000 mg | Freq: Once | INTRAMUSCULAR | Status: AC
  Administered 2016-07-09: 40 mg via INTRAMUSCULAR

## 2016-07-09 MED ORDER — TIZANIDINE HCL 2 MG PO CAPS: 2.0000 mg | ORAL_CAPSULE | Freq: Three times a day (TID) | ORAL | 0 refills | Status: DC

## 2016-07-09 NOTE — Progress Notes (Signed)
BP 133/74   Pulse 85   Temp 98.2 F (36.8 C) (Oral)   Ht 5\' 2"  (1.575 m)   Wt 158 lb 2 oz (71.7 kg)   BMI 28.92 kg/m    Subjective:    Patient ID: Tracey Juarez, female    DOB: December 06, 1940, 75 y.o.   MRN: [id number]  HPI: Tracey Juarez is a 75 y.o. female presenting on 07/09/2016 for Neck pain and stiffness and Left thumb pain   HPI Neck pain and stiffness Patient has been having neck muscle pain and stiffness that's been going on for the past couple weeks. She denies any fevers or chills or overlying redness or warmth. She denies any tingling or numbness going down into either of her arms or legs extending from the neck. Pain is worse with flexion and extension. She cannot recall any specific incident that brought this on. She denies any headaches associated with that.  Trigger thumb of left hand Patient is coming in again for her thumb over left hand. She has had 2 previous injections of this hand and it goes away for a while but then it seems to come back. We discussed at last visit that if it keeps recurring and she may need to go see an orthopedic and tach about other options. She would like to try an injection again this time to get her through the holidays and then she can discuss other options at that point. She denies any fevers or chills or overlying redness or warmth. She denies any numbness or weakness or loss of grip strength. Her left thumb just catches and then she had a lot of pain near the base of her left thumb.  Relevant past medical, surgical, family and social history reviewed and updated as indicated. Interim medical history since our last visit reviewed. Allergies and medications reviewed and updated.  Review of Systems  Constitutional: Negative for chills and fever.  HENT: Negative for congestion, ear discharge and ear pain.   Respiratory: Negative for chest tightness and shortness of breath.   Cardiovascular: Negative for chest pain and leg swelling.    Genitourinary: Negative for difficulty urinating and dysuria.  Musculoskeletal: Positive for arthralgias, neck pain and neck stiffness. Negative for back pain and gait problem.  Skin: Negative for rash.  Neurological: Negative for light-headedness and headaches.  Psychiatric/Behavioral: Negative for agitation and behavioral problems.  All other systems reviewed and are negative.   Per HPI unless specifically indicated above      Objective:    BP 133/74   Pulse 85   Temp 98.2 F (36.8 C) (Oral)   Ht 5\' 2"  (1.575 m)   Wt 158 lb 2 oz (71.7 kg)   BMI 28.92 kg/m   Wt Readings from Last 3 Encounters:  07/09/16 158 lb 2 oz (71.7 kg)  04/30/16 157 lb 4.8 oz (71.4 kg)  03/19/16 154 lb 12.8 oz (70.2 kg)    Physical Exam  Constitutional: She is oriented to person, place, and time. She appears well-developed and well-nourished. No distress.  Eyes: Conjunctivae are normal.  Cardiovascular: Normal rate, regular rhythm, normal heart sounds and intact distal pulses.   No murmur heard. Pulmonary/Chest: Effort normal and breath sounds normal. No respiratory distress. She has no wheezes. She has no rales.  Musculoskeletal: Normal range of motion. She exhibits no edema.       Cervical back: She exhibits tenderness (Bilateral paraspinal muscular tenderness). She exhibits normal range of motion, no bony tenderness, no  swelling and normal pulse.       Left hand: She exhibits tenderness. She exhibits normal range of motion.       Hands: Neurological: She is alert and oriented to person, place, and time. Coordination normal.  Skin: Skin is warm and dry. No rash noted. She is not diaphoretic.  Psychiatric: She has a normal mood and affect. Her behavior is normal.  Nursing note and vitals reviewed.  Trigger thumb injection: Risk factors of bleeding and infection discussed with patient and patient is agreeable towards injection. Patient prepped with Betadine. Anterior approach towards injection used.  Injected 40 mg of Depo-Medrol and 1/2 mL of 2% lidocaine. Patient tolerated procedure well and no side effects from noted. Minimal to no bleeding. Simple bandage applied after.     Assessment & Plan:   Problem List Items Addressed This Visit    None    Visit Diagnoses    Acute muscle stiffness of neck    -  Primary   Relevant Medications   tizanidine (ZANAFLEX) 2 MG capsule   Trigger thumb of left hand       Discussed that if it recurs she needs to go to orthopedic, she understands but would like to get through the holidays first   Relevant Medications   methylPREDNISolone acetate (DEPO-MEDROL) injection 40 mg (Start on 07/09/2016  3:30 PM)       Follow up plan: Return if symptoms worsen or fail to improve.  Counseling provided for all of the vaccine components No orders of the defined types were placed in this encounter.   Caryl Pina, MD [facility] 07/09/2016, 3:19 PM

## 2016-07-22 ENCOUNTER — Ambulatory Visit (INDEPENDENT_AMBULATORY_CARE_PROVIDER_SITE_OTHER): Payer: Medicare Other | Admitting: Family Medicine

## 2016-07-22 ENCOUNTER — Encounter: Payer: Self-pay | Admitting: Family Medicine

## 2016-07-22 VITALS — BP 138/86 | HR 76 | Temp 98.1°F | Ht 62.0 in | Wt 154.5 lb

## 2016-07-22 DIAGNOSIS — S93412A Sprain of calcaneofibular ligament of left ankle, initial encounter: Secondary | ICD-10-CM | POA: Diagnosis not present

## 2016-07-22 DIAGNOSIS — R0981 Nasal congestion: Secondary | ICD-10-CM

## 2016-07-22 NOTE — Progress Notes (Signed)
BP 138/86   Pulse 76   Temp 98.1 F (36.7 C) (Oral)   Ht 5\' 2"  (1.575 m)   Wt 154 lb 8 oz (70.1 kg)   BMI 28.26 kg/m    Subjective:    Patient ID: Tracey Juarez, female    DOB: [birth year], 75 y.o.   MRN: [id number]  HPI: Tracey Juarez is a 74 y.o. female presenting on 07/22/2016 for Leg Pain (left lower leg, began one week ago, no known injury) and Left ear (has noticed with loud noises her left ear feels like it is ringing and she feels dizzy)   HPI Left lower leg pain She has been having left lower leg pain for the past week. The leg pain has been on the lateral aspect of her leg and she cannot recall a specific trauma or incident. She denies any fevers or chills or swelling. Prior to exam today she did not know she had a lot of bruising on that leg either. She says the pain started just above her ankle on that left lateral side and has come up a little bit into the lateral aspect of her lower leg. She denies any difficulty with weightbearing. It is a little bit sore with ambulation but she can walk without a limp or issues.  Ear pressure and dizziness Patient has been having some pressure in her left ear and some dizziness that's mostly associated with loud sounds. She says when the loud sounds come sometimes she feels swimmy headed until it passes or she has to cover her ears. She denies any fevers or chills. She does admit that she has had some increased nasal congestion and seasonal allergies for which she started taking Zyrtec yesterday.  Relevant past medical, surgical, family and social history reviewed and updated as indicated. Interim medical history since our last visit reviewed. Allergies and medications reviewed and updated.  Review of Systems  Constitutional: Negative for chills and fever.  HENT: Positive for congestion, postnasal drip, rhinorrhea, sinus pressure and sore throat. Negative for ear discharge, ear pain and sneezing.   Eyes: Negative for pain, redness  and visual disturbance.  Respiratory: Negative for chest tightness and shortness of breath.   Cardiovascular: Negative for chest pain and leg swelling.  Genitourinary: Negative for difficulty urinating and dysuria.  Musculoskeletal: Negative for back pain and gait problem.  Skin: Negative for rash.  Neurological: Positive for dizziness. Negative for light-headedness and headaches.  Psychiatric/Behavioral: Negative for agitation and behavioral problems.  All other systems reviewed and are negative.   Per HPI unless specifically indicated above     Objective:    BP 138/86   Pulse 76   Temp 98.1 F (36.7 C) (Oral)   Ht 5\' 2"  (1.575 m)   Wt 154 lb 8 oz (70.1 kg)   BMI 28.26 kg/m   Wt Readings from Last 3 Encounters:  07/22/16 154 lb 8 oz (70.1 kg)  07/09/16 158 lb 2 oz (71.7 kg)  04/30/16 157 lb 4.8 oz (71.4 kg)    Physical Exam  Constitutional: She is oriented to person, place, and time. She appears well-developed and well-nourished. No distress.  HENT:  Right Ear: Tympanic membrane, external ear and ear canal normal.  Left Ear: Tympanic membrane, external ear and ear canal normal.  Nose: Mucosal edema and rhinorrhea present. No epistaxis. Right sinus exhibits no maxillary sinus tenderness and no frontal sinus tenderness. Left sinus exhibits no maxillary sinus tenderness and no frontal sinus tenderness.  Mouth/Throat:  Uvula is midline and mucous membranes are normal. Posterior oropharyngeal edema and posterior oropharyngeal erythema present. No oropharyngeal exudate or tonsillar abscesses.  Eyes: Conjunctivae are normal.  Cardiovascular: Normal rate, regular rhythm, normal heart sounds and intact distal pulses.   No murmur heard. Pulmonary/Chest: Effort normal and breath sounds normal. No respiratory distress. She has no wheezes. She has no rales.  Musculoskeletal: Normal range of motion. She exhibits no edema or tenderness.       Legs: Neurological: She is alert and oriented  to person, place, and time. Coordination normal.  Skin: Skin is warm and dry. No rash noted. She is not diaphoretic.  Psychiatric: She has a normal mood and affect. Her behavior is normal.  Vitals reviewed.     Assessment & Plan:   Problem List Items Addressed This Visit    None    Visit Diagnoses    Sprain of calcaneofibular ligament of left ankle, initial encounter    -  Primary   Left ankle pain and bruising, consistent with sprain, elevation stretching and compression   Sinus congestion       Recommended using Flonase and Mucinex and keeping Zyrtec going, will likely clear up the pressure in her left ear       Follow up plan: Return if symptoms worsen or fail to improve.  Counseling provided for all of the vaccine components No orders of the defined types were placed in this encounter.   Caryl Pina, MD [facility] 07/22/2016, 11:02 AM

## 2016-08-12 ENCOUNTER — Other Ambulatory Visit: Payer: Self-pay | Admitting: Family Medicine

## 2016-08-20 DIAGNOSIS — H43393 Other vitreous opacities, bilateral: Secondary | ICD-10-CM | POA: Diagnosis not present

## 2016-08-25 DIAGNOSIS — Z7902 Long term (current) use of antithrombotics/antiplatelets: Secondary | ICD-10-CM | POA: Diagnosis not present

## 2016-08-25 DIAGNOSIS — R040 Epistaxis: Secondary | ICD-10-CM | POA: Diagnosis not present

## 2016-08-25 DIAGNOSIS — Z79899 Other long term (current) drug therapy: Secondary | ICD-10-CM | POA: Diagnosis not present

## 2016-08-27 ENCOUNTER — Ambulatory Visit: Payer: Medicare Other | Admitting: Family Medicine

## 2016-09-04 ENCOUNTER — Ambulatory Visit (INDEPENDENT_AMBULATORY_CARE_PROVIDER_SITE_OTHER): Payer: Medicare Other | Admitting: Family Medicine

## 2016-09-04 ENCOUNTER — Encounter: Payer: Self-pay | Admitting: Family Medicine

## 2016-09-04 VITALS — BP 152/78 | HR 74 | Temp 97.6°F | Ht 62.0 in | Wt 157.0 lb

## 2016-09-04 DIAGNOSIS — G20 Parkinson's disease: Secondary | ICD-10-CM | POA: Diagnosis not present

## 2016-09-04 DIAGNOSIS — E559 Vitamin D deficiency, unspecified: Secondary | ICD-10-CM

## 2016-09-04 DIAGNOSIS — C439 Malignant melanoma of skin, unspecified: Secondary | ICD-10-CM

## 2016-09-04 DIAGNOSIS — I10 Essential (primary) hypertension: Secondary | ICD-10-CM

## 2016-09-04 DIAGNOSIS — Z13820 Encounter for screening for osteoporosis: Secondary | ICD-10-CM

## 2016-09-04 DIAGNOSIS — Z780 Asymptomatic menopausal state: Secondary | ICD-10-CM

## 2016-09-04 DIAGNOSIS — C50412 Malignant neoplasm of upper-outer quadrant of left female breast: Secondary | ICD-10-CM

## 2016-09-04 DIAGNOSIS — E7800 Pure hypercholesterolemia, unspecified: Secondary | ICD-10-CM

## 2016-09-04 MED ORDER — LISINOPRIL 10 MG PO TABS: 10.0000 mg | ORAL_TABLET | Freq: Every day | ORAL | 1 refills | Status: DC

## 2016-09-04 NOTE — Progress Notes (Signed)
Subjective:    Patient ID: Tracey Juarez, female    DOB: 1940/10/20, 76 y.o.   MRN: [id number]  HPI Pt here for follow up and management of chronic medical problems which includes hyperlipidemia and hypertension. She is taking medication regularly.The patient describes for nosebleeds in the last month. She is also complaining of neck stiffness. She is requesting refills on her bed treated and lisinopril. She is due to get a DEXA scan and hopefully can get this done today. She is also due to return an FOBT and get lab work. She sees the neurologist regularly. The patient denies any chest pain shortness of breath. She does have occasional heartburn and may take 3 or 4 Zantac a month. She denies any blood in the stool or black tarry bowel movements. She is passing her water well and frequently but no burning. She stays active physically because of her Parkinson's and her husband keeps her busy with exercises and dancing. Her blood pressure was elevated in the office today but she brings in home readings from the outside and all of these are good and she will continue to monitor the blood pressures.    Patient Active Problem List   Diagnosis Date Noted  . 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 Osteopenia 08/15/2013  . Parkinson disease ([place])   . Hypertension   . Atrophic vaginitis   . Detrusor instability   . Melanoma ([place])   . Mixed basal-squamous cell carcinoma    Outpatient Encounter Prescriptions as of 09/04/2016  Medication Sig  . anastrozole (ARIMIDEX) 1 MG tablet Take 1 tablet (1 mg total) by mouth daily.  . AZILECT 1 MG TABS tablet Take 1 tablet by mouth daily.  . Cholecalciferol (VITAMIN D) 2000 UNITS CAPS Take 1 capsule by mouth daily.  . clopidogrel (PLAVIX) 75 MG tablet Take 75 mg by mouth daily.  . diphenhydrAMINE (BENADRYL) 25 mg capsule Take 25 mg by mouth every 6 (six) hours as needed (for parkinson's symptoms).   Marland Kitchen lisinopril  (PRINIVIL,ZESTRIL) 10 MG tablet Take 1 tablet (10 mg total) by mouth daily.  . Magnesium 250 MG TABS Take 1 tablet by mouth daily.  . meclizine (ANTIVERT) 25 MG tablet TAKE TWO TABLETS BY MOUTH THREE TIMES DAILY AS NEEDED  . Multiple Vitamin (MULTIVITAMIN) capsule Take 1 capsule by mouth daily.    Marland Kitchen MYRBETRIQ 25 MG TB24 tablet TAKE ONE TABLET BY MOUTH ONCE DAILY  . Nutritional Supplements (ESTROVEN PO) Take by mouth daily. Patient not sure of dose  . ranitidine (ZANTAC) 150 MG tablet Take 150 mg by mouth 2 (two) times daily as needed.   . tizanidine (ZANAFLEX) 2 MG capsule Take 1 capsule (2 mg total) by mouth 3 (three) times daily.   Facility-Administered Encounter Medications as of 09/04/2016  Medication  . methylPREDNISolone acetate (DEPO-MEDROL) injection 40 mg      Review of Systems  Constitutional: Negative.   HENT: Positive for nosebleeds (4 nosebleeds in last month).   Eyes: Negative.   Respiratory: Negative.   Cardiovascular: Negative.   Gastrointestinal: Negative.   Endocrine: Negative.   Genitourinary: Negative.   Musculoskeletal: Positive for neck stiffness.  Skin: Negative.   Allergic/Immunologic: Negative.   Neurological: Negative.   Hematological: Negative.   Psychiatric/Behavioral: Negative.        Objective:   Physical Exam  Constitutional: She is oriented to person, place, and time. She appears well-developed and well-nourished. No distress.  The patient is pleasant  and alert and has a tremor in the right hand.  HENT:  Head: Normocephalic and atraumatic.  Left Ear: External ear normal.  Nose: Nose normal.  Mouth/Throat: No oropharyngeal exudate.  Is slight amount of ear wax in the right ear canal.  Eyes: Conjunctivae and EOM are normal. Pupils are equal, round, and reactive to light. Right eye exhibits no discharge. Left eye exhibits no discharge. No scleral icterus.  Neck: Normal range of motion. Neck supple. No thyromegaly present.  No bruits or  thyromegaly  Cardiovascular: Normal rate, regular rhythm and normal heart sounds.   No murmur heard. The heart is regular at 72/m  Pulmonary/Chest: Effort normal and breath sounds normal. No respiratory distress. She has no wheezes. She has no rales.  Clear anteriorly and posteriorly  Abdominal: Soft. Bowel sounds are normal. She exhibits no mass. There is no tenderness. There is no rebound and no guarding.  No liver or spleen enlargement. No epigastric tenderness. No masses. No suprapubic tenderness.  Musculoskeletal: Normal range of motion. She exhibits no edema or tenderness.  The patient has a hesitant range of motion secondary to her Parkinson's. She has a tremor in the right hand.  Lymphadenopathy:    She has no cervical adenopathy.  Neurological: She is alert and oriented to person, place, and time. She has normal reflexes. No cranial nerve deficit.  Parkinson's tremor present. She has some rigidity and movement.  Skin: Skin is warm and dry. No rash noted.  Psychiatric: She has a normal mood and affect. Her behavior is normal. Judgment and thought content normal.  Nursing note and vitals reviewed.  BP (!) 159/83 (BP Location: Right Arm)   Pulse 74   Temp 97.6 F (36.4 C) (Oral)   Ht '5\' 2"'  (1.575 m)   Wt 157 lb (71.2 kg)   BMI 28.72 kg/m         Assessment & Plan:  1. Pure hypercholesterolemia -Tracey Juarez current treatment pending results of lab work - CBC with Differential/Platelet - Lipid panel  2. Vitamin D deficiency -Continue current treatment pending results of lab work - DG [place]; Future - CBC with Differential/Platelet - VITAMIN D 25 Hydroxy (Vit-D Deficiency, Fractures)  3. Parkinson disease ([place]) -Continue follow-up with Tracey Juarez - CBC with Differential/Platelet  4. Essential hypertension -The blood pressure is elevated today on 2 different readings. Her numbers from home are good. She's had previous readings in this office which were good. We  will let her continue to monitor it at home and not make any changes in her medication today. - BMP8+EGFR - CBC with Differential/Platelet - Hepatic function panel  5. Postmenopausal -Continue calcium and vitamin D replacement and fall prevention - DG [facility] DEXA; Future - CBC with Differential/Platelet  6. Screening for osteoporosis -Calcium and vitamin D replacement with fall prevention - DG [facility] DEXA; Future - CBC with Differential/Platelet  7. Melanoma of skin ([place]) -Follow-up with dermatology as planned  8. Malignant neoplasm of upper-outer quadrant of left female breast, unspecified estrogen receptor status ([place]) -Follow-up with surgeon and oncologist as planned  Meds ordered this encounter  Medications  . lisinopril (PRINIVIL,ZESTRIL) 10 MG tablet    Sig: Take 1 tablet (10 mg total) by mouth daily.    Dispense:  90 tablet    Refill:  1   Patient Instructions                       Medicare Annual Wellness Visit  [facility] and the medical providers at [facility] strive to bring you the best medical care.  In doing so we not only want to address your current medical conditions and concerns but also to detect new conditions early and prevent illness, disease and health-related problems.    Medicare offers a yearly Wellness Visit which allows our clinical staff to assess your need for preventative services including immunizations, lifestyle education, counseling to decrease risk of preventable diseases and screening for fall risk and other medical concerns.    This visit is provided free of charge (no copay) for all Medicare recipients. The clinical pharmacists at [facility] have begun to conduct these Wellness Visits which will also include a thorough review of all your medications.    As you primary medical provider recommend that you make an appointment for your Annual Wellness Visit if you have not done so already this year.   You may set up this appointment before you leave today or you may call back ([phone]) and schedule an appointment.  Please make sure when you call that you mention that you are scheduling your Annual Wellness Visit with the clinical pharmacist so that the appointment may be made for the proper length of time.     Continue current medications. Continue good therapeutic lifestyle changes which include good diet and exercise. Fall precautions discussed with patient. If an FOBT was given today- please return it to our front desk. If you are over 51 years old - you may need Prevnar 61 or the adult Pneumonia vaccine.  **Flu shots are available--- please call and schedule a FLU-CLINIC appointment**  After your visit with [place] today you will receive a survey in the mail or online from [employer] regarding your care with [place]. Please take a moment to fill this out. Your feedback is very important to [place] as you can help [place] better understand your patient needs as well as improve your experience and satisfaction. WE CARE ABOUT YOU!!!  Continue to drink plenty of fluids and stay well hydrated Continue to use the cool mist humidifier Continue to use the nasal saline frequently in each nostril and directed toward the ear on each side so that you did not hit the nasal septum Use nasal saline gel in the nose at nighttime before going to bed Continue to follow-up with surgery hematology oncology and neurology Watch sodium intake and continue to monitor blood pressures at home   Arrie Senate MD

## 2016-09-04 NOTE — Patient Instructions (Addendum)
Medicare Annual Wellness Visit  [facility] and the medical providers at [facility] strive to bring you the best medical care.  In doing so we not only want to address your current medical conditions and concerns but also to detect new conditions early and prevent illness, disease and health-related problems.    Medicare offers a yearly Wellness Visit which allows our clinical staff to assess your need for preventative services including immunizations, lifestyle education, counseling to decrease risk of preventable diseases and screening for fall risk and other medical concerns.    This visit is provided free of charge (no copay) for all Medicare recipients. The clinical pharmacists at [facility] have begun to conduct these Wellness Visits which will also include a thorough review of all your medications.    As you primary medical provider recommend that you make an appointment for your Annual Wellness Visit if you have not done so already this year.  You may set up this appointment before you leave today or you may call back [phone] and schedule an appointment.  Please make sure when you call that you mention that you are scheduling your Annual Wellness Visit with the clinical pharmacist so that the appointment may be made for the proper length of time.     Continue current medications. Continue good therapeutic lifestyle changes which include good diet and exercise. Fall precautions discussed with patient. If an FOBT was given today- please return it to our front desk. If you are over 95 years old - you may need Prevnar 58 or the adult Pneumonia vaccine.  **Flu shots are available--- please call and schedule a FLU-CLINIC appointment**  After your visit with [place] today you will receive a survey in the mail or online from [employer] regarding your care with [place]. Please take a moment to fill this out. Your feedback is very  important to [place] as you can help [place] better understand your patient needs as well as improve your experience and satisfaction. WE CARE ABOUT YOU!!!  Continue to drink plenty of fluids and stay well hydrated Continue to use the cool mist humidifier Continue to use the nasal saline frequently in each nostril and directed toward the ear on each side so that you did not hit the nasal septum Use nasal saline gel in the nose at nighttime before going to bed Continue to follow-up with surgery hematology oncology and neurology Watch sodium intake and continue to monitor blood pressures at home

## 2016-09-05 LAB — HEPATIC FUNCTION PANEL
ALK PHOS: 95 IU/L (ref 39–117)
ALT: 15 IU/L (ref 0–32)
AST: 21 IU/L (ref 0–40)
Albumin: 4.5 g/dL (ref 3.5–4.8)
Bilirubin Total: 0.4 mg/dL (ref 0.0–1.2)
Bilirubin, Direct: 0.12 mg/dL (ref 0.00–0.40)
TOTAL PROTEIN: 7.3 g/dL (ref 6.0–8.5)

## 2016-09-05 LAB — CBC WITH DIFFERENTIAL/PLATELET
BASOS ABS: 0 10*3/uL (ref 0.0–0.2)
Basos: 1 %
EOS (ABSOLUTE): 0.3 10*3/uL (ref 0.0–0.4)
Eos: 5 %
Hematocrit: 40.2 % (ref 34.0–46.6)
Hemoglobin: 13.8 g/dL (ref 11.1–15.9)
IMMATURE GRANS (ABS): 0 10*3/uL (ref 0.0–0.1)
IMMATURE GRANULOCYTES: 0 %
Lymphocytes Absolute: 1.3 10*3/uL (ref 0.7–3.1)
Lymphs: 22 %
MCH: 30.8 pg (ref 26.6–33.0)
MCHC: 34.3 g/dL (ref 31.5–35.7)
MCV: 90 fL (ref 79–97)
MONOCYTES: 13 %
MONOS ABS: 0.8 10*3/uL (ref 0.1–0.9)
NEUTROS ABS: 3.6 10*3/uL (ref 1.4–7.0)
Neutrophils: 59 %
PLATELETS: 274 10*3/uL (ref 150–379)
RBC: 4.48 x10E6/uL (ref 3.77–5.28)
RDW: 14 % (ref 12.3–15.4)
WBC: 6.1 10*3/uL (ref 3.4–10.8)

## 2016-09-05 LAB — BMP8+EGFR
BUN / CREAT RATIO: 14 (ref 12–28)
BUN: 13 mg/dL (ref 8–27)
CHLORIDE: 99 mmol/L (ref 96–106)
CO2: 26 mmol/L (ref 18–29)
Calcium: 10 mg/dL (ref 8.7–10.3)
Creatinine, Ser: 0.93 mg/dL (ref 0.57–1.00)
GFR calc non Af Amer: 61 mL/min/{1.73_m2} (ref >59)
GFR, EST AFRICAN AMERICAN: 70 mL/min/{1.73_m2} (ref >59)
GLUCOSE: 88 mg/dL (ref 65–99)
Potassium: 4.2 mmol/L (ref 3.5–5.2)
Sodium: 143 mmol/L (ref 134–144)

## 2016-09-05 LAB — VITAMIN D 25 HYDROXY (VIT D DEFICIENCY, FRACTURES): VIT D 25 HYDROXY: 37.3 ng/mL (ref 30.0–100.0)

## 2016-09-05 LAB — LIPID PANEL
Chol/HDL Ratio: 4.4 ratio units (ref 0.0–4.4)
Cholesterol, Total: 283 mg/dL — ABNORMAL HIGH (ref 100–199)
HDL: 65 mg/dL (ref >39)
LDL Calculated: 195 mg/dL — ABNORMAL HIGH (ref 0–99)
TRIGLYCERIDES: 115 mg/dL (ref 0–149)
VLDL CHOLESTEROL CAL: 23 mg/dL (ref 5–40)

## 2016-09-16 ENCOUNTER — Telehealth: Payer: Self-pay | Admitting: Family Medicine

## 2016-09-16 NOTE — Telephone Encounter (Signed)
Pt called  - samples up front

## 2016-09-16 NOTE — Progress Notes (Signed)
[place]  Telephone:([phone]) [phone] [phone]) [phone]  Clinic Follow Up Note   Patient Care Team: Chipper Herb, MD as PCP - General (Family Medicine) Anastasio Auerbach, MD as Consulting Physician (Gynecology) Erroll Luna, MD as Consulting Physician (General Surgery) Truitt Merle, MD as Consulting Physician (Hematology) Thea Silversmith, MD as Consulting Physician (Radiation Oncology) Mauro Kaufmann, RN as Registered Nurse Rockwell Germany, RN as Registered Nurse Sylvan Cheese, NP as Nurse Practitioner (Nurse Practitioner) Freeman Caldron. Bjorn Loser, MD as Referring Physician (Neurology) 09/18/2016  CHIEF COMPLAINTS:  Follow Up left breast cancer  Oncology History   Breast cancer of upper-outer quadrant of left female breast [facility])   Staging form: Breast, AJCC 7th Edition     Clinical stage from 03/19/2015: Stage IIA (T2, N0, M0) - Signed by Truitt Merle, MD on 03/27/2015     Pathologic stage from 04/27/2015: Stage IA (T1c, N0, cM0) - Signed by Enid Cutter, MD on 05/01/2015       Staging comments: Staged on final surgical specimen by Dr. Donato Heinz.          Breast cancer of upper-outer quadrant of left female breast ([place])   03/06/2015 Mammogram    Hypoechoic area in the left breast 1:00 position, 3 cm from the nipple, measuring 1.9 x 1.1 x 1.2 cm, corresponding to an area of architectural distortion on mammogram.      03/19/2015 Initial Biopsy    Left breast core needle biopsy showed invasive lobular carcinoma, grade 1-2, lobular carcinoma in situ. ER 100% positive, PR 50% positive, HER-2 negative, Ki-67 5%.      03/22/2015 Breast MRI    2.1 x 1.2 x 1.7 cm irregular enhancement within the upper outer left breast compatible with biopsy-proven malignancy. No other abnormalities.      03/22/2015 Clinical Stage    Stage IIA: T2 N0      04/25/2015 Definitive Surgery    Left breast lumpectomy / SLNB: invasive lobular carcinoma, grade 1, margins were negative. LCIS; 3 sentinel  lymph nodes were negative (0/3), HER2/neu repeated and negative      04/25/2015 Pathologic Stage    Stage IA: T1c N0      04/25/2015 Oncotype testing    Recurrence score 15, which predicts 10% 10 year risk of distant recurrence with tamoxifen alone.      06/06/2015 - 06/27/2015 Radiation Therapy    Adjuvant RT: Left breast 42.72 Gy over 16 fractions      08/12/2015 -  Anti-estrogen oral therapy    Anastrozole 1 mg daily.      09/20/2015 Survivorship    Survivorship care plan completed and mailed to patient in lieu of in person visit at pt request.       HISTORY OF PRESENTING ILLNESS:  Tracey Juarez 76 y.o. female is here because of recently diagnosed left breast cancer. She presents to our multidisciplinary clinic with her husband.  This was discovered by screening mammogram, she did not feel the lump. She denies any skin or nipple change, no constitutional symptoms. The moment mammogram and ultrasound revealed a 1.9 cm mass at 1:00 position of left breast, core needle biopsy showed invasive lobular carcinoma. ER/PR stripe positive, HER-2 negative. She underwent breast MI after biopsy, which showed a 2.1 cm irregular enhancement mass in the upper outer left breast.   She has Parkinson disesease which diagnosed 5 years ago, mild, very physically active. She has no family history of breast or ovary cancer. She has no children.  CURRENT THERAPY: anastrozole 1 mg once daily, satarted on 06/11/2016  INTERIM HISTORY Mrs. Winiarski returns for follow-up. She has been doing well. She hasn't had any problems with the anastrozole. She drinks an apple juice, grape juice, and vinegar mixture that stopped the cramps she was getting. It's been so cold, she hasn't had any hot flashes. She has some occasional muscle stiffness in the mornings, but she believes this is from her Parkinson's. She does not use a cane to walk. Denies leg swelling or any other concerns.   MEDICAL HISTORY:  Past Medical  History:  Diagnosis Date  . Atrophic vaginitis   . Breast cancer of upper-outer quadrant of left female breast ([place]) 03/20/2015  . Detrusor instability   . Elevated cholesterol   . GERD (gastroesophageal reflux disease)   . Hot flashes   . Hypertension   . Melanoma ([place])   . Mixed basal-squamous cell carcinoma    skin  . Parkinson disease ([place])   . Stroke ([place])    on Plavix, no deficits    SURGICAL HISTORY: Past Surgical History:  Procedure Laterality Date  . BREAST LUMPECTOMY WITH RADIOACTIVE SEED AND SENTINEL LYMPH NODE BIOPSY Left 04/25/2015   Procedure: LEFT BREAST PARTIAL MASTECTOMY WITH RADIOACTIVE SEED AND SENTINEL LYMPH NODE MAPPING;  Surgeon: Erroll Luna, MD;  Location: [facility];  Service: General;  Laterality: Left;  . FOOT SURGERY  2004  . KNEE SURGERY  2007  . skin cancer excised    . TONSILLECTOMY     GYN HISTORY  Menarchal: 11 LMP: 51 Contraceptive: no  HRT: 10 years G0P0:  SOCIAL HISTORY: Social History   Social History  . Marital Status: Married    Spouse Name: N/A  . Number of Children: No   . Years of Education: N/A   Occupational History  . Retired    Social History Main Topics  . Smoking status: Never Smoker   . Smokeless tobacco: Never Used  . Alcohol Use: No  . Drug Use: No  . Sexual Activity: No   Other Topics Concern  . Not on file   Social History Narrative    FAMILY HISTORY: Family History  Problem Relation Age of Onset  . Hypertension Mother   . Heart attack Mother   . Heart attack Maternal Aunt   . Heart attack Maternal Uncle     ALLERGIES:  has No Known Allergies.  MEDICATIONS:  Current Outpatient Prescriptions  Medication Sig Dispense Refill  . anastrozole (ARIMIDEX) 1 MG tablet Take 1 tablet (1 mg total) by mouth daily. 90 tablet 1  . AZILECT 1 MG TABS tablet Take 1 tablet by mouth daily.    . Cholecalciferol (VITAMIN D) 2000 UNITS CAPS Take 1 capsule by mouth daily.    . clopidogrel (PLAVIX)  75 MG tablet Take 75 mg by mouth daily.    . diphenhydrAMINE (BENADRYL) 25 mg capsule Take 25 mg by mouth every 6 (six) hours as needed (for parkinson's symptoms).     Marland Kitchen lisinopril (PRINIVIL,ZESTRIL) 10 MG tablet Take 1 tablet (10 mg total) by mouth daily. 90 tablet 1  . Magnesium 250 MG TABS Take 1 tablet by mouth daily.    . meclizine (ANTIVERT) 25 MG tablet TAKE TWO TABLETS BY MOUTH THREE TIMES DAILY AS NEEDED 60 tablet 2  . Multiple Vitamin (MULTIVITAMIN) capsule Take 1 capsule by mouth daily.      Marland Kitchen MYRBETRIQ 25 MG TB24 tablet TAKE ONE TABLET BY MOUTH ONCE DAILY 30 tablet 1  .  Nutritional Supplements (ESTROVEN PO) Take by mouth daily. Patient not sure of dose    . ranitidine (ZANTAC) 150 MG tablet Take 150 mg by mouth 2 (two) times daily as needed.     . tizanidine (ZANAFLEX) 2 MG capsule Take 1 capsule (2 mg total) by mouth 3 (three) times daily. 30 capsule 0   Current Facility-Administered Medications  Medication Dose Route Frequency Provider Last Rate Last Dose  . methylPREDNISolone acetate (DEPO-MEDROL) injection 40 mg  40 mg Intra-Lesional Once Worthy Rancher, MD        REVIEW OF SYSTEMS:   Constitutional: Denies fevers, chills or abnormal night sweats Eyes: Denies blurriness of vision, double vision or watery eyes Ears, nose, mouth, throat, and face: Denies mucositis or sore throat Respiratory: Denies cough, dyspnea or wheezes Cardiovascular: Denies palpitation, chest discomfort or lower extremity swelling Gastrointestinal:  Denies nausea, heartburn or change in bowel habits Skin: Denies abnormal skin rashes Lymphatics: Denies new lymphadenopathy or easy bruising Neurological:Denies numbness, tingling or new weaknesses Musculoskeletal: (+) muscle stiffness Behavioral/Psych: Mood is stable, no new changes  All other systems were reviewed with the patient and are negative.  PHYSICAL EXAMINATION: ECOG PERFORMANCE STATUS: 2  Vitals:   09/18/16 1016  BP: (!) 143/70    Pulse: 86  Resp: 18  Temp: 98.5 F (36.9 C)   Filed Weights   09/18/16 1016  Weight: 159 lb 6.4 oz (72.3 kg)   GENERAL:alert, no distress and comfortable SKIN: skin color, texture, turgor are normal, no rashes or significant lesions EYES: normal, conjunctiva are pink and non-injected, sclera clear OROPHARYNX:no exudate, no erythema and lips, buccal mucosa, and tongue normal  NECK: supple, thyroid normal size, non-tender, without nodularity LYMPH:  no palpable lymphadenopathy in the cervical, axillary or inguinal LUNGS: clear to auscultation and percussion with normal breathing effort HEART: regular rate & rhythm and no murmurs and no lower extremity edema ABDOMEN:abdomen soft, non-tender and normal bowel sounds Musculoskeletal:no cyanosis of digits and no clubbing  PSYCH: alert & oriented x 3 with fluent speech NEURO: no focal motor/sensory deficits Breasts: Breast inspection showed them to be symmetrical with no nipple discharge. The surgical scar in the left breast has healed well. Skin pigmentation has resolved. Palpation of the right breast and axilla revealed no obvious mass that I could appreciate.  LABORATORY DATA:  I have reviewed the data as listed Lab Results  Component Value Date   WBC 4.6 09/18/2016   HGB 13.7 09/18/2016   HCT 41.0 09/18/2016   MCV 92.0 09/18/2016   PLT 244 09/18/2016    Recent Labs  02/25/16 1434 04/30/16 0751 09/04/16 1645 09/18/16 0955  NA 142 142 143 142  K 4.3 4.2 4.2 4.3  CL 100  --  99  --   CO2 _0 GLUCOSE 92 95 88 98  BUN 14 14.8 13 14.8  CREATININE 0.85 0.9 0.93 0.9  CALCIUM 10.0 9.8 10.0 10.2  GFRNONAA 68  --  61  --   GFRAA 79  --  70  --   PROT 7.1 7.0 7.3 7.1  ALBUMIN 4.5 3.5 4.5 3.9  AST _1 ALT _2 ALKPHOS 80 92 95 94  BILITOT 0.4 0.50 0.4 0.55  BILIDIR 0.13  --  0.12  --    PATHOLOGY REPORT: Diagnosis 04/25/2015 1. Breast, lumpectomy, left - INVASIVE LOBULAR CARCINOMA, SEE  COMMENT. 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 - PREVIOUS  BIOPSY SITE. - SEE TUMOR SYNOPTIC TEMPLATE BELOW. 2. Lymph node, sentinel, biopsy, left - ONE LYMPH NODE, NEGATIVE FOR TUMOR (0/1) SEE COMMENT. 3. Lymph node, sentinel, biopsy, left - ONE LYMPH NODE, NEGATIVE FOR TUMOR (0/1) SEE COMMENT. 4. Lymph node, sentinel, biopsy, left - ONE LYMPH NODE, NEGATIVE FOR TUMOR (0/1) SEE COMMENT. Specimen, including laterality and lymph node sampling (sentinel, non-sentinel): Left breat with sentinel lymph node sampling. Procedure: Lumpectomy. Histologic type: Lobular. Grade: 1 of 3 Tubule formation: 3 Nuclear pleomorphism: 1 Mitotic:1 Tumor size (gross measurement): 1.8 cm Margins: Invasive, distance to closest margin: 0.5 cm (anterior) In-situ, distance to closest margin: N/A If margin positive, focally or broadly: N/A Lymphovascular invasion: Absent. Ductal carcinoma in situ: Absent. Grade: Extensive. Extensive intraductal component: N/A Lobular neoplasia: Present. Tumor focality: Unifocal. Treatment effect: None. If present, treatment effect in breast tissue, lymph nodes or both: N/A Extent of tumor: Skin: N/A Nipple: N/A Skeletal muscle: N/A Lymph nodes: Examined: 3 Sentinel 0 Non-sentinel 3 Total Lymph nodes with metastasis: 0 Isolated tumor cells (< 0.2 mm): N/A Micrometastasis: (> 0.2 mm and < 2.0 mm): N/A Macrometastasis: (> 2.0 mm): N/A Extracapsular extension: N/A Breast prognostic profile: Estrogen receptor: Not repeated, previous study demonstrated 100% positivity, [id number]. Progesterone receptor: Not repeated, previous study demonstrated 50% positivity, [id number]. Her 2 neu: Repeated, previous study demonstrated no amplification, [id number]. Ki-67: Not repeated, previous study demonstrated 5% proliferation rate. Non-neoplastic breast: Previous biopsy site, fibrocystic change, benign radial scar,  calcifications. TNM: pT1c, pN0, pMX Comments: None. 2. - 4. There are no intranodal malignant epithelial tumor deposits identified on routine histology or with cytokeratin AE1/3 immunostains. (CRR:ecj 04/26/2015)  Results: HER2 - NEGATIVE RATIO OF HER2/CEP17 SIGNALS 1.69 AVERAGE HER2 COPY NUMBER PER CELL 2.45  Oncotype RS 15  RADIOGRAPHIC STUDIES: I have personally reviewed the radiological images as listed and agreed with the findings in the report.  MM Diag breast tomo bilateral 03/25/2016 IMPRESSION: No evidence of recurrent or new breast malignancy. Benign postsurgical changes on the left.  RECOMMENDATION: Diagnostic mammography in 1 year per standard post lumpectomy protocol.  ASSESSMENT & PLAN: 76 y.o. Caucasian female, postmenopausal, presented with screening detected left breast cancer.  1. Left breast invasive lobular carcinoma, pT1cN0M0, stage IA, ER/PR positive, HER-2 negative, Ki-67 5%, Oncotype low risk -I previously reviewed her surgical pathology findings with her and her husband in great detail. -She had a complete surgical resection, margins were negative, sentinel lymph nodes were negative, she has low-grade stage I breast cancer, likely cured by surgery alone. -We previously discussed the small risk of cancer recurrence after her surgery. I reviewed her Oncotype DX test results, the recurrence score is 15, which predicts 10% 10 year distant recurrence with tamoxifen alone. -she is currently on adjuvant anastrozole, has developed moderate hot flash and leg cramps, improved lately, will continue anastrozole for now, plan for total 5 years. -we'll continue breast cancer surveillance with annual mammogram, self-exam, and routine follow-up with [place].  -She is clinically doing well, last mammogram in 03/2016 was negative, lab and physical exam are unremarkable today, no evidence of recurrence. We'll continue surveillance. -She is due for her mammogram in August. I have  ordered this for her.   2. Osteopenia -She hada bone density scan done in April 2015, which showed osteopenia with T score of -1.9 -I encouraged her to continue vitamin D and calcium  -We previously discussed that the national may potentially make her osteopenia worse.  -We'll follow-up her bone density scan every 2 years.  She has one scheduled next week (February 2018).   3. Parkinson disease, hypertension -She'll continue follow-up with her primary care physician -Stable  4. Hot flash  -Secondary to anastrozole, much better lately, manageable. -I recommended SSRI for her hot flash, OK with her neurologist, but she declined  5. Leg cramps  -I encouraged her to drink more water, avoid dehydration, adequate calcium and continue magnesium -She has been drinking a mixture of apple juice, grape juice, and vinegar that has helped this.  -Resolved.   Plan  -continue anastrozole -Ordered mammogram for August-September 2018.  -Return in 6 months for follow-up.   All questions were answered. The patient knows to call the clinic with any problems, questions or concerns.  I spent 20 minutes counseling the patient face to face. The total time spent in the appointment was 25 minutes and more than 50% was on counseling.  This document serves as a record of services personally performed by Truitt Merle, MD. It was created on her behalf by Martinique Casey, a trained medical scribe. The creation of this record is based on the scribe's personal observations and the provider's statements to them. This document has been checked and approved by the attending provider.  I have reviewed the above documentation for accuracy and completeness and I agree with the above.  Truitt Merle  09/18/2016

## 2016-09-18 ENCOUNTER — Telehealth: Payer: Self-pay | Admitting: Hematology

## 2016-09-18 ENCOUNTER — Encounter: Payer: Self-pay | Admitting: Hematology

## 2016-09-18 ENCOUNTER — Ambulatory Visit (HOSPITAL_BASED_OUTPATIENT_CLINIC_OR_DEPARTMENT_OTHER): Payer: Medicare Other | Admitting: Hematology

## 2016-09-18 ENCOUNTER — Other Ambulatory Visit (HOSPITAL_BASED_OUTPATIENT_CLINIC_OR_DEPARTMENT_OTHER): Payer: Medicare Other

## 2016-09-18 VITALS — BP 143/70 | HR 86 | Temp 98.5°F | Resp 18 | Ht 62.0 in | Wt 159.4 lb

## 2016-09-18 DIAGNOSIS — G20 Parkinson's disease: Secondary | ICD-10-CM

## 2016-09-18 DIAGNOSIS — Z79811 Long term (current) use of aromatase inhibitors: Secondary | ICD-10-CM

## 2016-09-18 DIAGNOSIS — M8580 Other specified disorders of bone density and structure, unspecified site: Secondary | ICD-10-CM

## 2016-09-18 DIAGNOSIS — Z170 Estrogen receptor positive status [ER+]: Secondary | ICD-10-CM | POA: Diagnosis not present

## 2016-09-18 DIAGNOSIS — C50412 Malignant neoplasm of upper-outer quadrant of left female breast: Secondary | ICD-10-CM

## 2016-09-18 DIAGNOSIS — I10 Essential (primary) hypertension: Secondary | ICD-10-CM

## 2016-09-18 LAB — CBC WITH DIFFERENTIAL/PLATELET
BASO%: 0.9 % (ref 0.0–2.0)
BASOS ABS: 0 10*3/uL (ref 0.0–0.1)
EOS%: 6.3 % (ref 0.0–7.0)
Eosinophils Absolute: 0.3 10*3/uL (ref 0.0–0.5)
HCT: 41 % (ref 34.8–46.6)
HGB: 13.7 g/dL (ref 11.6–15.9)
LYMPH%: 22.2 % (ref 14.0–49.7)
MCH: 30.8 pg (ref 25.1–34.0)
MCHC: 33.5 g/dL (ref 31.5–36.0)
MCV: 92 fL (ref 79.5–101.0)
MONO#: 0.7 10*3/uL (ref 0.1–0.9)
MONO%: 14.6 % — AB (ref 0.0–14.0)
NEUT#: 2.6 10*3/uL (ref 1.5–6.5)
NEUT%: 56 % (ref 38.4–76.8)
Platelets: 244 10*3/uL (ref 145–400)
RBC: 4.45 10*6/uL (ref 3.70–5.45)
RDW: 13.6 % (ref 11.2–14.5)
WBC: 4.6 10*3/uL (ref 3.9–10.3)
lymph#: 1 10*3/uL (ref 0.9–3.3)

## 2016-09-18 LAB — COMPREHENSIVE METABOLIC PANEL
ALT: 18 U/L (ref 0–55)
AST: 19 U/L (ref 5–34)
Albumin: 3.9 g/dL (ref 3.5–5.0)
Alkaline Phosphatase: 94 U/L (ref 40–150)
Anion Gap: 8 mEq/L (ref 3–11)
BUN: 14.8 mg/dL (ref 7.0–26.0)
CALCIUM: 10.2 mg/dL (ref 8.4–10.4)
CHLORIDE: 106 meq/L (ref 98–109)
CO2: 28 meq/L (ref 22–29)
Creatinine: 0.9 mg/dL (ref 0.6–1.1)
EGFR: 62 mL/min/{1.73_m2} — ABNORMAL LOW (ref >90)
GLUCOSE: 98 mg/dL (ref 70–140)
POTASSIUM: 4.3 meq/L (ref 3.5–5.1)
SODIUM: 142 meq/L (ref 136–145)
Total Bilirubin: 0.55 mg/dL (ref 0.20–1.20)
Total Protein: 7.1 g/dL (ref 6.4–8.3)

## 2016-09-18 MED ORDER — ANASTROZOLE 1 MG PO TABS: 1.0000 mg | ORAL_TABLET | Freq: Every day | ORAL | 1 refills | Status: DC

## 2016-09-18 NOTE — Telephone Encounter (Signed)
Patient requested to have appointments scheduled in September rather than August. Spoke with Dr. Burr Medico to see if it's okay patient scheduled for 9/26 per request.   Appointments scheduled per 2/8 LOS. Patient given AVS report and calendars with future scheduled appointments.

## 2016-09-24 ENCOUNTER — Ambulatory Visit (INDEPENDENT_AMBULATORY_CARE_PROVIDER_SITE_OTHER): Payer: Medicare Other | Admitting: Pharmacist

## 2016-09-24 ENCOUNTER — Ambulatory Visit (INDEPENDENT_AMBULATORY_CARE_PROVIDER_SITE_OTHER): Payer: Medicare Other

## 2016-09-24 ENCOUNTER — Encounter: Payer: Self-pay | Admitting: Pharmacist

## 2016-09-24 VITALS — Ht 62.0 in | Wt 160.0 lb

## 2016-09-24 DIAGNOSIS — Z780 Asymptomatic menopausal state: Secondary | ICD-10-CM

## 2016-09-24 DIAGNOSIS — M8580 Other specified disorders of bone density and structure, unspecified site: Secondary | ICD-10-CM

## 2016-09-24 DIAGNOSIS — Z13820 Encounter for screening for osteoporosis: Secondary | ICD-10-CM

## 2016-09-24 DIAGNOSIS — E559 Vitamin D deficiency, unspecified: Secondary | ICD-10-CM

## 2016-09-24 NOTE — Patient Instructions (Signed)
Continue with exercise - great job!   Calcium & Vitamin D: The Facts  Why is calcium and vitamin D consumption important? Calcium: . Most Americans do not consume adequate amounts of calcium! Calcium is required for proper muscle function, nerve communication, bone support, and many other functions in the body.  . The body uses bones as a source of calcium. Bones 'remodel' themselves continuously - the body constantly breaks bone down to release calcium and rebuilds bones by replacing calcium in the bone later.  . As we get older, the rate of bone breakdown occurs faster than bone rebuilding which could lead to osteopenia, osteoporosis, and possible fractures.   Vitamin D: . People naturally make vitamin D in the body when sunlight hits the skin and triggers a process that leads to vitamin D production. This natural vitamin D production requires about 10-15 minutes of sun exposure on the hands, arms, and face at least 2-3 times per week. However, due to decreased sun exposure and the use of sunscreen, most people will need to get additional vitamin D from foods or supplements. Your doctor can measure your body's vitamin D level through a simple blood test to determine your daily vitamin D needs.  . Vitamin D is used to help the body absorb calcium, maintain bone health, help the immune system, and reduce inflammation. It also plays a role in muscle performance, balance and risk of falling.  . Vitamin D deficiency can lead to osteomalacia or softening of the bones, bone pain, and muscle weakness.   The recommended daily allowance of Calcium and Vitamin D varies for different age groups. Age group Calcium (mg) Vitamin D (IU)  Females and Males: Age 73-50 1000 mg 600 IU  Females: Age 51- 59 1200 mg 600 IU  Males: Age 56-70 1000 mg 600 IU  Females and Males: Age 25+ 1200 mg 800 IU  Pregnant/lactating Females age 41-50 1000 mg 600 IU   How much Calcium do you get in your diet? Calcium Intake # of  servings per day  Total calcium (mg)  Skim milk, 2% milk (1 cup) _________ x 300 mg   Yogurt (1 small container) _________ x 200 mg   Cheese (1oz) _________ x 200 mg   Cottage Cheese (1 cup)             ________ x 150 mg   Almond milk (1 cup) _________ x 450 mg   Fortified Orange Juice (1 cup) _________ x 300 mg   Broccoli or spinach ( 1 cup) _________ x 100 mg   Salmon (3 oz) _________ x 150 mg    Almonds (1/4 cup) _______ x 90 mg      How do we get Calcium and Vitamin D in our diet? Calcium: . Obtaining calcium from the diet is the most preferred way to reach the recommended daily goal. If this goal is not reached through diet, calcium supplements are available.  . Calcium is found in many foods including: dairy products, dark leafy vegetables (like broccoli, kale, and spinach), fish, and fortified products like juices and cereals.  . The food label will have a %DV (percent daily value) listed showing the amount of calcium per serving. To determine the total mg per serving, simply replace the % with zero (0).  For example, Almond Breeze almond milk contains 45% DV of calcium or 450mg  per 1 cup.  . You can increase the amount of calcium in your diet by using more calcium products in  your daily meals. Use yogurt and fruit to make smoothies or use yogurt to top baked potatoes or make whipped potatoes. Sprinkle low fat cheese onto salads or into egg white omelets. You can even add non-fat dry milk powder (300mg  calcium per 1/3 cup) to hot cereals, meat loaf, soups, or potatoes.  . Calcium supplements come in many forms including tablets, chewables, and gummies. Be sure to read the label to determine the correct number of tablets per serving and whether or not to take the supplement with food.  . Calcium carbonate products (Oscal, Caltrate, and Viactiv) are generally better absorbed when taken with food while calcium citrate products like Citracal can be taken with or without food.  . The body can  only absorb about 600 mg of calcium at one time. It is recommended to take calcium supplements in small amounts several times per day.  However, taking it all at once is better than not taking it at all. . Increasing your intake of calcium is essential for bone health, but may also lead to some side effects like constipation, increased gas, bloating or abdominal cramping. To help reduce these side effects, start with 1 tablet per day and slowly increase your intake of the supplement to the recommended doses. It is also recommended that you drink plenty of water each day. Vitamin D: . Very few foods naturally contain vitamin D. However, it is found in saltwater fish (like tuna, salmon and mackerel), beef liver, egg yolks, cheese and vitamin D fortified foods (like yogurt, cereals, orange juice and milk) . The amount of vitamin D in each food or product is listed as %DV on the product label. To determine the total amount of vitamin D per serving, drop the % sign and multiply the number by 4. For example, 1 cup of Almond Breeze almond milk contains 25% DV vitamin D or 100 IU per serving (25 x 4 =100). . Vitamin D is also found in multivitamins and supplements and may be listed as ergocalciferol (vitamin D2) or cholecalciferol (vitamin D3). Each of these forms of vitamin D are equivalent and the daily recommended intake will vary based on your age and the vitamin D levels in your body. Follow your doctor's recommendation for vitamin D intake.       Fall Prevention in the Home Falls can cause injuries and can affect people from all age groups. There are many simple things that you can do to make your home safe and to help prevent falls. What can I do on the outside of my home?  Regularly repair the edges of walkways and driveways and fix any cracks.  Remove high doorway thresholds.  Trim any shrubbery on the main path into your home.  Use bright outdoor lighting.  Clear walkways of debris and  clutter, including tools and rocks.  Regularly check that handrails are securely fastened and in good repair. Both sides of any steps should have handrails.  Install guardrails along the edges of any raised decks or porches.  Have leaves, snow, and ice cleared regularly.  Use sand or salt on walkways during winter months.  In the garage, clean up any spills right away, including grease or oil spills. What can I do in the bathroom?  Use night lights.  Install grab bars by the toilet and in the tub and shower. Do not use towel bars as grab bars.  Use non-skid mats or decals on the floor of the tub or shower.  If you  need to sit down while you are in the shower, use a plastic, non-slip stool.  Keep the floor dry. Immediately clean up any water that spills on the floor.  Remove soap buildup in the tub or shower on a regular basis.  Attach bath mats securely with double-sided non-slip rug tape.  Remove throw rugs and other tripping hazards from the floor. What can I do in the bedroom?  Use night lights.  Make sure that a bedside light is easy to reach.  Do not use oversized bedding that drapes onto the floor.  Have a firm chair that has side arms to use for getting dressed.  Remove throw rugs and other tripping hazards from the floor. What can I do in the kitchen?  Clean up any spills right away.  Avoid walking on wet floors.  Place frequently used items in easy-to-reach places.  If you need to reach for something above you, use a sturdy step stool that has a grab bar.  Keep electrical cables out of the way.  Do not use floor polish or wax that makes floors slippery. If you have to use wax, make sure that it is non-skid floor wax.  Remove throw rugs and other tripping hazards from the floor. What can I do in the stairways?  Do not leave any items on the stairs.  Make sure that there are handrails on both sides of the stairs. Fix handrails that are broken or loose.  Make sure that handrails are as long as the stairways.  Check any carpeting to make sure that it is firmly attached to the stairs. Fix any carpet that is loose or worn.  Avoid having throw rugs at the top or bottom of stairways, or secure the rugs with carpet tape to prevent them from moving.  Make sure that you have a light switch at the top of the stairs and the bottom of the stairs. If you do not have them, have them installed. What are some other fall prevention tips?  Wear closed-toe shoes that fit well and support your feet. Wear shoes that have rubber soles or low heels.  When you use a stepladder, make sure that it is completely opened and that the sides are firmly locked. Have someone hold the ladder while you are using it. Do not climb a closed stepladder.  Add color or contrast paint or tape to grab bars and handrails in your home. Place contrasting color strips on the first and last steps.  Use mobility aids as needed, such as canes, walkers, scooters, and crutches.  Turn on lights if it is dark. Replace any light bulbs that burn out.  Set up furniture so that there are clear paths. Keep the furniture in the same spot.  Fix any uneven floor surfaces.  Choose a carpet design that does not hide the edge of steps of a stairway.  Be aware of any and all pets.  Review your medicines with your healthcare provider. Some medicines can cause dizziness or changes in blood pressure, which increase your risk of falling. Talk with your health care provider about other ways that you can decrease your risk of falls. This may include working with a physical therapist or trainer to improve your strength, balance, and endurance. This information is not intended to replace advice given to you by your health care provider. Make sure you discuss any questions you have with your health care provider. Document Released: 07/18/2002 Document Revised: 12/25/2015 Document Reviewed:  09/01/2014  Elsevier Interactive Patient Education  2017 [employer]  

## 2016-09-24 NOTE — Progress Notes (Signed)
Patient ID: Tracey Juarez, female   DOB: 12-Aug-1940, 77 y.o.   MRN: [id number]    CC: osteopenia      HPI: Patient with osteopenia with history of humerus fracture about 18 years ago.  Fracture happen when she turned and tripped over foot stool.  Back Pain?  Yes  - when standing for extended periods       Kyphosis?  No Med(s) for Osteoporosis/Osteopenia:  None - discussed in past but patient refused Med(s) previously tried for Osteoporosis/Osteopenia:  none                                                             PMH: Age at menopause:  2's Hysterectomy?  No Oophorectomy?  No HRT? No - stopped when breast cancer diagnosed.  She is now taking anastrozole 84m daily for 5 years and she has taken for about 1 years so far.   Previsoulsy took premarin/provera for 10 yrs. And estradiol cream Steroid Use?  No Thyroid med?  No History of cancer?  Yes - skin cancer and breast History of digestive disorders (ie Crohn's)?  Yes - hiatal hernia.  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  Take ranitidine as needed for reflux (4 to 5 times per month)  Current or previous eating disorders?  No Last Vitamin D Result:  37.3 (09/04/2016) Last eGFR Result:  62 (09/18/2016)   FH/SH: Family history of osteoporosis?  No Parent with history of hip fracture?  No Family history of breast cancer?  No Exercise?  Yes - some dancing 2-3 times per weekly  Also goes to [employer] days per week - uses weight machines for 30 minutes and treadmill for 30 mintues Smoking?  No Alcohol?  No    Calcium Assessment Calcium Intake  # of servings/day  Calcium mg  Milk (8 oz) 0.5  x  300  = 1573m Yogurt (4 oz) 1 x  200 = 20011mCheese (1 oz) 0 x  200 = 0  Other Calcium sources   250m60ma supplement MVI = 400mg57mstimated calcium intake per day 1000mg 53matient was taking calcium supplement daily but she was feeling "trembly and weak"  She stopped and now feel better.  DEXA Results Date  of Test T-Score for AP Spine L1-L3 T-Score for neck of Left Hip  09/24/2016 -1.3 -2.0  11/30/2013 -0.2 -1.6  01/29/2011 -0.7 -1.7  10/13/2005 0.0 -1.5       FRAX 10 year estimate: Total FX risk:  19%  (consider medication if >/= 20%) Hip FX risk:  4.1%  (consider medication if >/= 3%)  Assessment: Osteopenia with high fracture risk assessment and taking anastrozole which can increase bones loss  Recommendations: 1.  Discussed results of BMD and effects of anastrozole with patient.  She is open to starting treatment.  Recommend Atelvia 35mg t34m1 tablet weekly.  Take with at least 4 ounces of water.  2.  continue calcium 1200mg da49mthrough supplementation or diet.  3.  continue weight bearing exercise - 30 to 60 minutes 3 to 4  days per week.   4.  Counseled and educated about fall risk and prevention.  Recheck DEXA:  2 years  Time spent counseling  patient:  35 minutes  Cherre Robins, PharmD, CPP, CDE

## 2016-09-26 ENCOUNTER — Encounter: Payer: Self-pay | Admitting: Pharmacist

## 2016-09-26 MED ORDER — RISEDRONATE SODIUM 35 MG PO TBEC: 1.0000 | DELAYED_RELEASE_TABLET | ORAL | 3 refills | Status: DC

## 2016-10-16 ENCOUNTER — Other Ambulatory Visit: Payer: Medicare Other

## 2016-10-16 ENCOUNTER — Ambulatory Visit: Payer: Medicare Other | Admitting: Hematology

## 2016-10-27 DIAGNOSIS — Z85828 Personal history of other malignant neoplasm of skin: Secondary | ICD-10-CM | POA: Diagnosis not present

## 2016-10-27 DIAGNOSIS — L821 Other seborrheic keratosis: Secondary | ICD-10-CM | POA: Diagnosis not present

## 2016-10-27 DIAGNOSIS — L570 Actinic keratosis: Secondary | ICD-10-CM | POA: Diagnosis not present

## 2016-11-18 ENCOUNTER — Other Ambulatory Visit: Payer: Self-pay | Admitting: Family Medicine

## 2016-11-18 DIAGNOSIS — M79671 Pain in right foot: Secondary | ICD-10-CM | POA: Diagnosis not present

## 2016-11-18 DIAGNOSIS — M2041 Other hammer toe(s) (acquired), right foot: Secondary | ICD-10-CM | POA: Diagnosis not present

## 2016-11-18 DIAGNOSIS — M2042 Other hammer toe(s) (acquired), left foot: Secondary | ICD-10-CM | POA: Diagnosis not present

## 2016-11-18 DIAGNOSIS — M7741 Metatarsalgia, right foot: Secondary | ICD-10-CM | POA: Diagnosis not present

## 2016-11-18 NOTE — Telephone Encounter (Signed)
Pt notified samples are ready for pick-up.  

## 2016-12-17 DIAGNOSIS — G20 Parkinson's disease: Secondary | ICD-10-CM | POA: Diagnosis not present

## 2016-12-22 ENCOUNTER — Ambulatory Visit (INDEPENDENT_AMBULATORY_CARE_PROVIDER_SITE_OTHER): Payer: Medicare Other

## 2016-12-22 ENCOUNTER — Ambulatory Visit (INDEPENDENT_AMBULATORY_CARE_PROVIDER_SITE_OTHER): Payer: Medicare Other | Admitting: Family Medicine

## 2016-12-22 ENCOUNTER — Encounter: Payer: Self-pay | Admitting: Family Medicine

## 2016-12-22 VITALS — BP 135/65 | HR 78 | Temp 99.0°F | Ht 62.0 in | Wt 155.0 lb

## 2016-12-22 DIAGNOSIS — M25561 Pain in right knee: Secondary | ICD-10-CM

## 2016-12-22 NOTE — Progress Notes (Signed)
BP 135/65   Pulse 78   Temp 99 F (37.2 C) (Oral)   Ht 5\' 2"  (1.575 m)   Wt 155 lb (70.3 kg)   BMI 28.35 kg/m    Subjective:    Patient ID: Tracey Juarez, female    DOB: 1941/03/14, 76 y.o.   MRN: [id number]  HPI: Tracey Juarez is a 76 y.o. female presenting on 12/22/2016 for Knee Pain (began 10 days ago after planting flowers outside; knee is painful and swollen)   HPI Right knee pain Patient has been having right lateral knee pain that began about 10 days ago after she was outside working in the urine planting flowers a lot. The knee pain has had swelling and pain is mostly located on the lateral aspect of the knee. She has been taking Tylenol and using BenGay which have been helping some but not getting rid of it. She denies any fevers or chills or redness or warmth. She denies any giving way or popping or catching or numbness.  Relevant past medical, surgical, family and social history reviewed and updated as indicated. Interim medical history since our last visit reviewed. Allergies and medications reviewed and updated.  Review of Systems  Constitutional: Negative for chills and fever.  Respiratory: Negative for chest tightness and shortness of breath.   Cardiovascular: Negative for chest pain and leg swelling.  Musculoskeletal: Positive for arthralgias and joint swelling. Negative for back pain and gait problem.  Skin: Negative for rash.  Neurological: Negative for light-headedness and headaches.  Psychiatric/Behavioral: Negative for agitation and behavioral problems.  All other systems reviewed and are negative.   Per HPI unless specifically indicated above        Objective:    BP 135/65   Pulse 78   Temp 99 F (37.2 C) (Oral)   Ht 5\' 2"  (1.575 m)   Wt 155 lb (70.3 kg)   BMI 28.35 kg/m   Wt Readings from Last 3 Encounters:  12/22/16 155 lb (70.3 kg)  09/24/16 160 lb (72.6 kg)  09/18/16 159 lb 6.4 oz (72.3 kg)    Physical Exam  Constitutional: She  is oriented to person, place, and time. She appears well-developed and well-nourished. No distress.  Eyes: Conjunctivae are normal.  Musculoskeletal: Normal range of motion. She exhibits tenderness (Right knee pain, more lateral than medial. Unable to elicit on meniscal testing or anterior cruciate ligament or PCL testing.). She exhibits no edema.       Right knee: She exhibits swelling. She exhibits normal range of motion, no erythema, normal alignment, no LCL laxity, normal patellar mobility, no bony tenderness, normal meniscus and no MCL laxity. Tenderness found. Lateral joint line tenderness noted.  Neurological: She is alert and oriented to person, place, and time. Coordination normal.  Skin: Skin is warm and dry. No rash noted. She is not diaphoretic.  Psychiatric: She has a normal mood and affect. Her behavior is normal.  Nursing note and vitals reviewed.   Right knee x-ray:mild compartmental narrowing, no acute bony abnormalities, await for final read by radiologist    Assessment & Plan:   Problem List Items Addressed This Visit    None    Visit Diagnoses    Acute pain of right knee    -  Primary   Relevant Orders   DG Knee 1-2 Views Right (Completed)       Follow up plan: Return if symptoms worsen or fail to improve.  Counseling provided for all of the  vaccine components Orders Placed This Encounter  Procedures  . DG Knee 1-2 Views Right    Caryl Pina, MD [facility] 12/22/2016, 12:01 PM

## 2017-02-22 ENCOUNTER — Other Ambulatory Visit: Payer: Self-pay | Admitting: Family Medicine

## 2017-03-04 ENCOUNTER — Encounter: Payer: Self-pay | Admitting: Family Medicine

## 2017-03-04 ENCOUNTER — Ambulatory Visit (INDEPENDENT_AMBULATORY_CARE_PROVIDER_SITE_OTHER): Payer: Medicare Other | Admitting: Family Medicine

## 2017-03-04 VITALS — BP 159/80 | HR 78 | Temp 97.2°F | Ht 62.0 in | Wt 153.0 lb

## 2017-03-04 DIAGNOSIS — C50412 Malignant neoplasm of upper-outer quadrant of left female breast: Secondary | ICD-10-CM | POA: Diagnosis not present

## 2017-03-04 DIAGNOSIS — D692 Other nonthrombocytopenic purpura: Secondary | ICD-10-CM | POA: Insufficient documentation

## 2017-03-04 DIAGNOSIS — E7800 Pure hypercholesterolemia, unspecified: Secondary | ICD-10-CM

## 2017-03-04 DIAGNOSIS — G20 Parkinson's disease: Secondary | ICD-10-CM | POA: Diagnosis not present

## 2017-03-04 DIAGNOSIS — I10 Essential (primary) hypertension: Secondary | ICD-10-CM

## 2017-03-04 DIAGNOSIS — E559 Vitamin D deficiency, unspecified: Secondary | ICD-10-CM | POA: Diagnosis not present

## 2017-03-04 DIAGNOSIS — C439 Malignant melanoma of skin, unspecified: Secondary | ICD-10-CM

## 2017-03-04 MED ORDER — LISINOPRIL 10 MG PO TABS: 10.0000 mg | ORAL_TABLET | Freq: Every day | ORAL | 3 refills | Status: DC

## 2017-03-04 NOTE — Progress Notes (Signed)
Subjective:    Patient ID: Tracey Juarez, female    DOB: 14-Jun-1941, 76 y.o.   MRN: [id number]  HPI Pt here for follow up and management of chronic medical problems which includes hyperlipidemia and hypertension. She is taking medication regularly.The patient is doing well overall. She is been followed regularly for her breast cancer and melanoma. Her blood pressure readings were elevated this morning and evening elevated on repeat. She is taking lisinopril. Lisinopril is 10 mg daily. She has no specific complaints. She does bring in blood pressure readings from home all of these are excellent. These will be scanned into the record. The patient is followed regularly by the neurologist because of her tremor and was recently changed to Mirapex because she did not tolerate the carbidopa well. She also sees the oncologist in September because of her breast cancer. She sees the dermatologist also in September because of her melanoma. She has a GYN appointment coming up soon and she goes to the eye doctor regularly. She denies any chest pain or shortness of breath. She denies any problems with his swallowing other than some occasional heartburn and no nausea vomiting diarrhea or blood in the stool. She is passing her water without problems. She says her blood pressures run well at home but for some reason they're always up in the office. The 2 readings here today were elevated but home readings were excellent. The patient says she may be eating a little bit more salt than usual and will try to cut back on this.    Patient Active Problem List   Diagnosis Date Noted  . Breast cancer of upper-outer quadrant of left female breast ([place]) 03/20/2015  . Osteopenia 08/15/2013  . Parkinson disease ([place])   . Hypertension   . Atrophic vaginitis   . Detrusor instability   . Melanoma ([place])   . Mixed basal-squamous cell carcinoma    Outpatient Encounter Prescriptions as of 03/04/2017  Medication Sig  .  anastrozole (ARIMIDEX) 1 MG tablet Take 1 tablet (1 mg total) by mouth daily.  . AZILECT 1 MG TABS tablet Take 1 tablet by mouth daily.  . Cholecalciferol (VITAMIN D) 2000 UNITS CAPS Take 1 capsule by mouth daily.  . clopidogrel (PLAVIX) 75 MG tablet Take 75 mg by mouth daily.  . diphenhydrAMINE (BENADRYL) 25 mg capsule Take 25 mg by mouth every 6 (six) hours as needed (for parkinson's symptoms).   Marland Kitchen lisinopril (PRINIVIL,ZESTRIL) 10 MG tablet Take 1 tablet (10 mg total) by mouth daily.  . Magnesium 250 MG TABS Take 1 tablet by mouth daily.  . Multiple Vitamin (MULTIVITAMIN) capsule Take 1 capsule by mouth daily.    Marland Kitchen MYRBETRIQ 25 MG TB24 tablet TAKE ONE TABLET BY MOUTH ONCE DAILY  . Nutritional Supplements (ESTROVEN PO) Take by mouth daily. Patient not sure of dose  . pramipexole (MIRAPEX) 0.125 MG tablet Take 0.125 mg by mouth 3 (three) times daily.  . ranitidine (ZANTAC) 150 MG tablet Take 150 mg by mouth 2 (two) times daily as needed.   . Risedronate Sodium (ATELVIA) 35 MG TBEC Take 1 tablet (35 mg total) by mouth once a week.  . [DISCONTINUED] carbidopa-levodopa (SINEMET IR) 10-100 MG tablet Take 10-100 tablets by mouth 3 (three) times daily.  . [DISCONTINUED] meclizine (ANTIVERT) 25 MG tablet TAKE TWO TABLETS BY MOUTH THREE TIMES DAILY AS NEEDED  . [DISCONTINUED] MYRBETRIQ 25 MG TB24 tablet TAKE ONE TABLET BY MOUTH ONCE DAILY  . [DISCONTINUED] tizanidine (ZANAFLEX) 2 MG capsule  Take 1 capsule (2 mg total) by mouth 3 (three) times daily.  . [DISCONTINUED] methylPREDNISolone acetate (DEPO-MEDROL) injection 40 mg    No facility-administered encounter medications on file as of 03/04/2017.       Review of Systems  Constitutional: Negative.   HENT: Negative.   Eyes: Negative.   Respiratory: Negative.   Cardiovascular: Negative.   Gastrointestinal: Negative.   Endocrine: Negative.   Genitourinary: Negative.   Musculoskeletal: Negative.   Skin: Negative.   Allergic/Immunologic:  Negative.   Neurological: Negative.   Hematological: Negative.   Psychiatric/Behavioral: Negative.        Objective:   Physical Exam  Constitutional: She is oriented to person, place, and time. She appears well-developed and well-nourished. No distress.  The patient is pleasant and alert and has Ace slight tremor in the right hand when she is sitting on the table.  HENT:  Head: Normocephalic and atraumatic.  Right Ear: External ear normal.  Left Ear: External ear normal.  Nose: Nose normal.  Mouth/Throat: Oropharynx is clear and moist. No oropharyngeal exudate.  Eyes: Pupils are equal, round, and reactive to light. Conjunctivae and EOM are normal. Right eye exhibits no discharge. Left eye exhibits no discharge. No scleral icterus.  Neck: Normal range of motion. Neck supple. No thyromegaly present.  No Bruits thyromegaly or anterior cervical adenopathy  Cardiovascular: Normal rate, regular rhythm, normal heart sounds and intact distal pulses.   No murmur heard. The heart has a regular rate and rhythm at 72/m  Pulmonary/Chest: Effort normal and breath sounds normal. No respiratory distress. She has no wheezes. She has no rales.  Clear anteriorly and posteriorly  Abdominal: Soft. Bowel sounds are normal. She exhibits no mass. There is no tenderness. There is no rebound and no guarding.  No abdominal tenderness masses or organ enlargement bruits or inguinal adenopathy  Musculoskeletal: Normal range of motion. She exhibits no edema.  Lymphadenopathy:    She has no cervical adenopathy.  Neurological: She is alert and oriented to person, place, and time. She has normal reflexes. No cranial nerve deficit.  Reflexes are equal bilaterally with resting tremor right hand noted.  Skin: Skin is warm and dry. No rash noted.  Senile purpura left forearm  Psychiatric: She has a normal mood and affect. Her behavior is normal. Judgment and thought content normal.  Nursing note and vitals  reviewed.  BP (!) 159/80   Pulse 78   Temp (!) 97.2 F (36.2 C) (Oral)   Ht _0  (1.575 m)   Wt 153 lb (69.4 kg)   BMI 27.98 kg/m         Assessment & Plan:  1. Vitamin D deficiency -Continue current treatment pending results of lab work - CBC with Differential/Platelet - VITAMIN D 25 Hydroxy (Vit-D Deficiency, Fractures)  2. Pure hypercholesterolemia -Continue current treatment and aggressive therapeutic lifestyle changes as possible - CBC with Differential/Platelet - Lipid panel  3. Parkinson disease ([place]) -Continue follow-up with neurology, Dr. Olen Pel in [place] with Differential/Platelet  4. Essential hypertension -The blood pressure is elevated today but home readings have been excellent. We will have her watch her sodium intake more closely and continue to bring readings to the office with her future visits. - CBC with Differential/Platelet - BMP8+EGFR - Hepatic function panel  5. Senile purpura ([place]) -This was noted today on the left arm.  6. Malignant neoplasm of upper-outer quadrant of left female breast, unspecified estrogen receptor status ([place]) -Continue to follow-up with oncology  7. Melanoma of skin ([place]) -Continue to follow-up with dermatology  Meds ordered this encounter  Medications  . pramipexole (MIRAPEX) 0.125 MG tablet    Sig: Take 0.125 mg by mouth 3 (three) times daily.  Marland Kitchen lisinopril (PRINIVIL,ZESTRIL) 10 MG tablet    Sig: Take 1 tablet (10 mg total) by mouth daily.    Dispense:  90 tablet    Refill:  3   Patient Instructions                       Medicare Annual Wellness Visit  [facility] and the medical providers at [facility] strive to bring you the best medical care.  In doing so we not only want to address your current medical conditions and concerns but also to detect new conditions early and prevent illness, disease and health-related problems.    Medicare offers a yearly Wellness Visit  which allows our clinical staff to assess your need for preventative services including immunizations, lifestyle education, counseling to decrease risk of preventable diseases and screening for fall risk and other medical concerns.    This visit is provided free of charge (no copay) for all Medicare recipients. The clinical pharmacists at [facility] have begun to conduct these Wellness Visits which will also include a thorough review of all your medications.    As you primary medical provider recommend that you make an appointment for your Annual Wellness Visit if you have not done so already this year.  You may set up this appointment before you leave today or you may call back ([phone]) and schedule an appointment.  Please make sure when you call that you mention that you are scheduling your Annual Wellness Visit with the clinical pharmacist so that the appointment may be made for the proper length of time.     Continue current medications. Continue good therapeutic lifestyle changes which include good diet and exercise. Fall precautions discussed with patient. If an FOBT was given today- please return it to our front desk. If you are over 25 years old - you may need Prevnar 71 or the adult Pneumonia vaccine.  **Flu shots are available--- please call and schedule a FLU-CLINIC appointment**  After your visit with [place] today you will receive a survey in the mail or online from [employer] regarding your care with [place]. Please take a moment to fill this out. Your feedback is very important to [place] as you can help [place] better understand your patient needs as well as improve your experience and satisfaction. WE CARE ABOUT YOU!!!   Follow-up with specialist as planned Continue to follow-up with neurology, oncology, dermatology, GYN, and get yearly eye exams. Continue to monitor blood pressures at home and watch sodium intake more closely Continue with current blood pressure  medicine Return the FOBT We will call with lab work results as soon as those results become available    Arrie Senate MD

## 2017-03-04 NOTE — Patient Instructions (Addendum)
Medicare Annual Wellness Visit  [facility] and the medical providers at [facility] strive to bring you the best medical care.  In doing so we not only want to address your current medical conditions and concerns but also to detect new conditions early and prevent illness, disease and health-related problems.    Medicare offers a yearly Wellness Visit which allows our clinical staff to assess your need for preventative services including immunizations, lifestyle education, counseling to decrease risk of preventable diseases and screening for fall risk and other medical concerns.    This visit is provided free of charge (no copay) for all Medicare recipients. The clinical pharmacists at [facility] have begun to conduct these Wellness Visits which will also include a thorough review of all your medications.    As you primary medical provider recommend that you make an appointment for your Annual Wellness Visit if you have not done so already this year.  You may set up this appointment before you leave today or you may call back ([phone]) and schedule an appointment.  Please make sure when you call that you mention that you are scheduling your Annual Wellness Visit with the clinical pharmacist so that the appointment may be made for the proper length of time.     Continue current medications. Continue good therapeutic lifestyle changes which include good diet and exercise. Fall precautions discussed with patient. If an FOBT was given today- please return it to our front desk. If you are over 61 years old - you may need Prevnar 74 or the adult Pneumonia vaccine.  **Flu shots are available--- please call and schedule a FLU-CLINIC appointment**  After your visit with [place] today you will receive a survey in the mail or online from [employer] regarding your care with [place]. Please take a moment to fill this out. Your feedback is very  important to [place] as you can help [place] better understand your patient needs as well as improve your experience and satisfaction. WE CARE ABOUT YOU!!!   Follow-up with specialist as planned Continue to follow-up with neurology, oncology, dermatology, GYN, and get yearly eye exams. Continue to monitor blood pressures at home and watch sodium intake more closely Continue with current blood pressure medicine Return the FOBT We will call with lab work results as soon as those results become available

## 2017-03-05 LAB — CBC WITH DIFFERENTIAL/PLATELET
BASOS ABS: 0 10*3/uL (ref 0.0–0.2)
Basos: 1 %
EOS (ABSOLUTE): 0.3 10*3/uL (ref 0.0–0.4)
Eos: 5 %
Hematocrit: 40.4 % (ref 34.0–46.6)
Hemoglobin: 13.4 g/dL (ref 11.1–15.9)
Immature Grans (Abs): 0 10*3/uL (ref 0.0–0.1)
Immature Granulocytes: 0 %
LYMPHS ABS: 1.3 10*3/uL (ref 0.7–3.1)
Lymphs: 26 %
MCH: 30.2 pg (ref 26.6–33.0)
MCHC: 33.2 g/dL (ref 31.5–35.7)
MCV: 91 fL (ref 79–97)
MONOS ABS: 0.6 10*3/uL (ref 0.1–0.9)
Monocytes: 12 %
NEUTROS ABS: 2.7 10*3/uL (ref 1.4–7.0)
Neutrophils: 56 %
PLATELETS: 270 10*3/uL (ref 150–379)
RBC: 4.43 x10E6/uL (ref 3.77–5.28)
RDW: 14.4 % (ref 12.3–15.4)
WBC: 4.8 10*3/uL (ref 3.4–10.8)

## 2017-03-05 LAB — LIPID PANEL
CHOLESTEROL TOTAL: 280 mg/dL — AB (ref 100–199)
Chol/HDL Ratio: 4.3 ratio (ref 0.0–4.4)
HDL: 65 mg/dL (ref >39)
LDL Calculated: 191 mg/dL — ABNORMAL HIGH (ref 0–99)
Triglycerides: 118 mg/dL (ref 0–149)
VLDL CHOLESTEROL CAL: 24 mg/dL (ref 5–40)

## 2017-03-05 LAB — HEPATIC FUNCTION PANEL
ALK PHOS: 83 IU/L (ref 39–117)
ALT: 13 IU/L (ref 0–32)
AST: 23 IU/L (ref 0–40)
Albumin: 4.6 g/dL (ref 3.5–4.8)
Bilirubin Total: 0.4 mg/dL (ref 0.0–1.2)
Bilirubin, Direct: 0.11 mg/dL (ref 0.00–0.40)
TOTAL PROTEIN: 6.9 g/dL (ref 6.0–8.5)

## 2017-03-05 LAB — BMP8+EGFR
BUN / CREAT RATIO: 13 (ref 12–28)
BUN: 12 mg/dL (ref 8–27)
CHLORIDE: 104 mmol/L (ref 96–106)
CO2: 24 mmol/L (ref 20–29)
Calcium: 10.1 mg/dL (ref 8.7–10.3)
Creatinine, Ser: 0.92 mg/dL (ref 0.57–1.00)
GFR calc Af Amer: 71 mL/min/{1.73_m2} (ref >59)
GFR calc non Af Amer: 62 mL/min/{1.73_m2} (ref >59)
GLUCOSE: 92 mg/dL (ref 65–99)
Potassium: 4.3 mmol/L (ref 3.5–5.2)
SODIUM: 144 mmol/L (ref 134–144)

## 2017-03-05 LAB — VITAMIN D 25 HYDROXY (VIT D DEFICIENCY, FRACTURES): VIT D 25 HYDROXY: 39.9 ng/mL (ref 30.0–100.0)

## 2017-03-05 MED ORDER — ROSUVASTATIN CALCIUM 10 MG PO TABS: 10.0000 mg | ORAL_TABLET | Freq: Every day | ORAL | 1 refills | Status: DC

## 2017-03-05 NOTE — Addendum Note (Signed)
Addended by: Thana Ates on: 03/05/2017 09:09 AM   Modules accepted: Orders

## 2017-03-24 ENCOUNTER — Ambulatory Visit (INDEPENDENT_AMBULATORY_CARE_PROVIDER_SITE_OTHER): Payer: Medicare Other | Admitting: Gynecology

## 2017-03-24 ENCOUNTER — Encounter: Payer: Self-pay | Admitting: Gynecology

## 2017-03-24 VITALS — BP 140/76 | Ht 61.5 in | Wt 153.0 lb

## 2017-03-24 DIAGNOSIS — C50912 Malignant neoplasm of unspecified site of left female breast: Secondary | ICD-10-CM

## 2017-03-24 DIAGNOSIS — N952 Postmenopausal atrophic vaginitis: Secondary | ICD-10-CM

## 2017-03-24 DIAGNOSIS — Z01411 Encounter for gynecological examination (general) (routine) with abnormal findings: Secondary | ICD-10-CM | POA: Diagnosis not present

## 2017-03-24 DIAGNOSIS — M8580 Other specified disorders of bone density and structure, unspecified site: Secondary | ICD-10-CM

## 2017-03-24 DIAGNOSIS — Z853 Personal history of malignant neoplasm of breast: Secondary | ICD-10-CM | POA: Diagnosis not present

## 2017-03-24 NOTE — Patient Instructions (Signed)
Follow up in one year, sooner as needed. 

## 2017-03-24 NOTE — Progress Notes (Signed)
    Tracey Juarez 25-Apr-1941 [id number]        76 y.o.  G0P0 for breast and pelvic exam  Past medical history,surgical history, problem list, medications, allergies, family history and social history were all reviewed and documented as reviewed in the EPIC chart.  ROS:  Performed with pertinent positives and negatives included in the history, assessment and plan.   Additional significant findings :  None   Exam: Caryn Bee assistant Vitals:   03/24/17 1032  BP: 140/76  Weight: 153 lb (69.4 kg)  Height: 5' 1.5" (1.562 m)   Body mass index is 28.44 kg/m.  General appearance:  Normal affect, orientation and appearance. Skin: Grossly normal HEENT: Without gross lesions.  No cervical or supraclavicular adenopathy. Thyroid normal.  Lungs:  Clear without wheezing, rales or rhonchi Cardiac: RR, without RMG Abdominal:  Soft, nontender, without masses, guarding, rebound, organomegaly or hernia Breasts:  Examined lying and sitting. Right without masses, retractions, discharge or axillary adenopathy.  Left with lumpectomy scar and radiation changes. No masses or axillary adenopathy. Pelvic:  Ext, BUS, Vagina: With atrophic changes  Cervix: Atrophic flush with the vagina  Uterus: Difficult to palpate but no gross masses or tenderness  Adnexa: Without gross masses or tenderness    Anus and perineum: Normal   Rectovaginal: Normal sphincter tone without palpated masses or tenderness.    Assessment/Plan:  76 y.o. G0P0 female for breast and pelvic exam  1. Postmenopausal/atrophic genital changes. Does have some significant atrophic changes. Asymptomatic to the patient without itching or irritation. No significant hot flushes, night sweats or vaginal bleeding. Continue to monitor report any issues or bleeding. 2. Osteopenia. DEXA reported 2018 through her primary physician's office. I did not have a copy of this. Did have prior osteopenia 2015 with T score -1.9. She'll continue to follow up  with them in reference to her bone health. 3. Breast cancer history. Status post left lumpectomy and radiation.  Exam stable without evidence of disease. Mammography coming due this month and she is going to schedule follow up for this. On Arimidex. Continue to follow up with oncology. 4. Pap smear 2012. No Pap smear done today. No history of abnormal Pap smears. We both agree to stop screening per current screening guidelines based on age. 5. Colonoscopy 2015. Repeat at their recommended interval. 6. Health maintenance. No routine lab work done as patient does this elsewhere. Follow up 1 year, sooner as needed.   Anastasio Auerbach MD, 11:04 AM 03/24/2017

## 2017-03-25 DIAGNOSIS — G20 Parkinson's disease: Secondary | ICD-10-CM | POA: Diagnosis not present

## 2017-04-15 ENCOUNTER — Ambulatory Visit
Admission: RE | Admit: 2017-04-15 | Discharge: 2017-04-15 | Disposition: A | Discharge status: Home / Self Care | Payer: Medicare Other | Source: Ambulatory Visit | Attending: Hematology | Admitting: Hematology

## 2017-04-15 DIAGNOSIS — R922 Inconclusive mammogram: Secondary | ICD-10-CM | POA: Diagnosis not present

## 2017-04-15 DIAGNOSIS — Z170 Estrogen receptor positive status [ER+]: Principal | ICD-10-CM

## 2017-04-15 DIAGNOSIS — C50412 Malignant neoplasm of upper-outer quadrant of left female breast: Secondary | ICD-10-CM

## 2017-04-20 ENCOUNTER — Ambulatory Visit (INDEPENDENT_AMBULATORY_CARE_PROVIDER_SITE_OTHER): Payer: Medicare Other | Admitting: Family Medicine

## 2017-04-20 ENCOUNTER — Encounter: Payer: Self-pay | Admitting: Family Medicine

## 2017-04-20 VITALS — BP 133/74 | HR 79 | Temp 98.8°F | Ht 62.0 in | Wt 155.0 lb

## 2017-04-20 DIAGNOSIS — E7800 Pure hypercholesterolemia, unspecified: Secondary | ICD-10-CM | POA: Diagnosis not present

## 2017-04-20 DIAGNOSIS — M1711 Unilateral primary osteoarthritis, right knee: Secondary | ICD-10-CM | POA: Diagnosis not present

## 2017-04-20 MED ORDER — METHYLPREDNISOLONE ACETATE 80 MG/ML IJ SUSP
80.0000 mg | Freq: Once | INTRAMUSCULAR | Status: AC
  Administered 2017-04-20: 80 mg via INTRAMUSCULAR

## 2017-04-20 NOTE — Progress Notes (Signed)
BP 133/74   Pulse 79   Temp 98.8 F (37.1 C) (Oral)   Ht 5\' 2"  (1.575 m)   Wt 155 lb (70.3 kg)   BMI 28.35 kg/m    Subjective:    Patient ID: Tracey Juarez, female    DOB: July 15, 1941, 76 y.o.   MRN: [id number]  HPI: Tracey Juarez is a 76 y.o. female presenting on 04/20/2017 for Pain and swelling in right knee (began about 2 weeks ago after being at beach)   HPI Right knee pain and swelling Patient comes in today for flareup of her right knee pain and swelling that all started over the past couple weeks while she was on vacation at the beach and getting up and down out of a golf cart. She cannot recall any specific trauma or fall that caused the problem but just that she started having problems after the frequent movements. She denies any fevers or chills or redness or warmth or weakness  Relevant past medical, surgical, family and social history reviewed and updated as indicated. Interim medical history since our last visit reviewed. Allergies and medications reviewed and updated.  Review of Systems  Constitutional: Negative for chills and fever.  Respiratory: Negative for chest tightness and shortness of breath.   Cardiovascular: Negative for chest pain and leg swelling.  Musculoskeletal: Positive for arthralgias and joint swelling. Negative for back pain and gait problem.  Skin: Negative for rash.  Neurological: Negative for light-headedness and headaches.  Psychiatric/Behavioral: Negative for agitation and behavioral problems.  All other systems reviewed and are negative.   Per HPI unless specifically indicated above     Objective:    BP 133/74   Pulse 79   Temp 98.8 F (37.1 C) (Oral)   Ht 5\' 2"  (1.575 m)   Wt 155 lb (70.3 kg)   BMI 28.35 kg/m   Wt Readings from Last 3 Encounters:  04/20/17 155 lb (70.3 kg)  03/24/17 153 lb (69.4 kg)  03/04/17 153 lb (69.4 kg)    Physical Exam  Constitutional: She is oriented to person, place, and time. She appears  well-developed and well-nourished. No distress.  Eyes: Conjunctivae are normal.  Musculoskeletal: Normal range of motion. She exhibits no edema.       Right knee: She exhibits effusion. She exhibits normal range of motion, no deformity, no erythema, normal alignment, no LCL laxity, normal patellar mobility, normal meniscus and no MCL laxity. Tenderness found. Lateral joint line tenderness noted.  Neurological: She is alert and oriented to person, place, and time. Coordination normal.  Skin: Skin is warm and dry. No rash noted. She is not diaphoretic.  Psychiatric: She has a normal mood and affect. Her behavior is normal.  Nursing note and vitals reviewed.   Knee injection: Consent form signed. Risk factors of bleeding and infection discussed with patient and patient is agreeable towards injection. Patient prepped with Betadine. Lateral approach towards injection used. Injected 80 mg of Depo-Medrol and 1 mL of 2% lidocaine. Patient tolerated procedure well and no side effects from noted. Minimal to no bleeding. Simple bandage applied after.     Assessment & Plan:   Problem List Items Addressed This Visit    None    Visit Diagnoses    Primary osteoarthritis of right knee    -  Primary   Relevant Medications   methylPREDNISolone acetate (DEPO-MEDROL) injection 80 mg (Start on 04/20/2017  2:15 PM)       Follow up plan: Return if  symptoms worsen or fail to improve.  Counseling provided for all of the vaccine components No orders of the defined types were placed in this encounter.   Caryl Pina, MD [facility] 04/20/2017, 2:07 PM

## 2017-04-21 LAB — HEPATIC FUNCTION PANEL
ALT: 16 IU/L (ref 0–32)
AST: 19 IU/L (ref 0–40)
Albumin: 4.4 g/dL (ref 3.5–4.8)
Alkaline Phosphatase: 75 IU/L (ref 39–117)
Bilirubin Total: 0.3 mg/dL (ref 0.0–1.2)
Bilirubin, Direct: 0.1 mg/dL (ref 0.00–0.40)
TOTAL PROTEIN: 6.6 g/dL (ref 6.0–8.5)

## 2017-04-24 ENCOUNTER — Ambulatory Visit (INDEPENDENT_AMBULATORY_CARE_PROVIDER_SITE_OTHER): Payer: Medicare Other | Admitting: Family Medicine

## 2017-04-24 ENCOUNTER — Encounter: Payer: Self-pay | Admitting: Family Medicine

## 2017-04-24 VITALS — BP 138/89 | HR 71 | Temp 98.1°F | Ht 62.0 in | Wt 155.0 lb

## 2017-04-24 DIAGNOSIS — M660 Rupture of popliteal cyst: Secondary | ICD-10-CM

## 2017-04-24 NOTE — Progress Notes (Signed)
   BP 138/89   Pulse 71   Temp 98.1 F (36.7 C) (Oral)   Ht 5\' 2"  (1.575 m)   Wt 155 lb (70.3 kg)   BMI 28.35 kg/m    Subjective:    Patient ID: Caprice Beaver, female    DOB: [birth year], 76 y.o.   MRN: [id number]  HPI: CEAIRRA MCCARVER is a 76 y.o. female presenting on 04/24/2017 for Knee Pain (burning pain in side of right knee, worried it could be a blood clot)   HPI Knee pain and swelling Patient has pain behind her right knee that extends down just in the upper part of her calf that started bothering her just yesterday. She had an injection 4 days ago steroid knee because of swelling and pain. She said had improved for couple days but then has come back with more swelling over the past day. She denies any fevers or chills or redness or warmth. She rates the pain is mild to moderate and a pressure sensation  Relevant past medical, surgical, family and social history reviewed and updated as indicated. Interim medical history since our last visit reviewed. Allergies and medications reviewed and updated.  Review of Systems  Constitutional: Negative for chills and fever.  Respiratory: Negative for chest tightness and shortness of breath.   Cardiovascular: Negative for chest pain and leg swelling.  Musculoskeletal: Positive for arthralgias and joint swelling. Negative for back pain and gait problem.  Skin: Negative for rash.  Neurological: Negative for light-headedness and headaches.  Psychiatric/Behavioral: Negative for agitation and behavioral problems.  All other systems reviewed and are negative.   Per HPI unless specifically indicated above        Objective:    BP 138/89   Pulse 71   Temp 98.1 F (36.7 C) (Oral)   Ht 5\' 2"  (1.575 m)   Wt 155 lb (70.3 kg)   BMI 28.35 kg/m   Wt Readings from Last 3 Encounters:  04/24/17 155 lb (70.3 kg)  04/20/17 155 lb (70.3 kg)  03/24/17 153 lb (69.4 kg)    Physical Exam  Constitutional: She appears well-developed and  well-nourished. No distress.  Eyes: Conjunctivae are normal.  Musculoskeletal: Normal range of motion. She exhibits no edema.       Right knee: She exhibits swelling (Slight amount of swelling and pain behind the right knee extending into the upper part of the calf right below it.). She exhibits normal range of motion, no effusion, no deformity, no erythema, normal alignment, no LCL laxity, normal patellar mobility, no bony tenderness, normal meniscus and no MCL laxity. No tenderness found.  Neurological: She is alert. Coordination normal.  Skin: Skin is warm and dry. She is not diaphoretic. No erythema.  Nursing note and vitals reviewed.       Assessment & Plan:   Problem List Items Addressed This Visit    None    Visit Diagnoses    Ruptured Bakers cyst    -  Primary   Patient has pain behind her knee but is likely a ruptured Baker cyst. No signs of acute DVT       Follow up plan: Return if symptoms worsen or fail to improve.  Counseling provided for all of the vaccine components No orders of the defined types were placed in this encounter.   Caryl Pina, MD [facility] 04/24/2017, 1:16 PM

## 2017-04-30 DIAGNOSIS — L570 Actinic keratosis: Secondary | ICD-10-CM | POA: Diagnosis not present

## 2017-05-06 ENCOUNTER — Ambulatory Visit (HOSPITAL_BASED_OUTPATIENT_CLINIC_OR_DEPARTMENT_OTHER): Payer: Medicare Other | Admitting: Hematology

## 2017-05-06 ENCOUNTER — Telehealth: Payer: Self-pay | Admitting: Family Medicine

## 2017-05-06 ENCOUNTER — Other Ambulatory Visit (HOSPITAL_BASED_OUTPATIENT_CLINIC_OR_DEPARTMENT_OTHER): Payer: Medicare Other

## 2017-05-06 ENCOUNTER — Telehealth: Payer: Self-pay | Admitting: Hematology

## 2017-05-06 ENCOUNTER — Other Ambulatory Visit: Payer: Self-pay | Admitting: Family Medicine

## 2017-05-06 ENCOUNTER — Encounter: Payer: Self-pay | Admitting: Hematology

## 2017-05-06 VITALS — BP 162/70 | HR 77 | Temp 98.8°F | Resp 18 | Ht 62.0 in | Wt 154.1 lb

## 2017-05-06 DIAGNOSIS — C50412 Malignant neoplasm of upper-outer quadrant of left female breast: Secondary | ICD-10-CM

## 2017-05-06 DIAGNOSIS — Z170 Estrogen receptor positive status [ER+]: Principal | ICD-10-CM

## 2017-05-06 DIAGNOSIS — I10 Essential (primary) hypertension: Secondary | ICD-10-CM | POA: Diagnosis not present

## 2017-05-06 DIAGNOSIS — M8580 Other specified disorders of bone density and structure, unspecified site: Secondary | ICD-10-CM

## 2017-05-06 DIAGNOSIS — M85852 Other specified disorders of bone density and structure, left thigh: Secondary | ICD-10-CM

## 2017-05-06 LAB — CBC WITH DIFFERENTIAL/PLATELET
BASO%: 0.5 % (ref 0.0–2.0)
Basophils Absolute: 0 10*3/uL (ref 0.0–0.1)
EOS ABS: 0.1 10*3/uL (ref 0.0–0.5)
EOS%: 1.7 % (ref 0.0–7.0)
HCT: 40.4 % (ref 34.8–46.6)
HGB: 13.1 g/dL (ref 11.6–15.9)
LYMPH%: 21.2 % (ref 14.0–49.7)
MCH: 30.2 pg (ref 25.1–34.0)
MCHC: 32.4 g/dL (ref 31.5–36.0)
MCV: 93.1 fL (ref 79.5–101.0)
MONO#: 0.8 10*3/uL (ref 0.1–0.9)
MONO%: 13.4 % (ref 0.0–14.0)
NEUT%: 63.2 % (ref 38.4–76.8)
NEUTROS ABS: 3.8 10*3/uL (ref 1.5–6.5)
PLATELETS: 204 10*3/uL (ref 145–400)
RBC: 4.34 10*6/uL (ref 3.70–5.45)
RDW: 13 % (ref 11.2–14.5)
WBC: 6 10*3/uL (ref 3.9–10.3)
lymph#: 1.3 10*3/uL (ref 0.9–3.3)

## 2017-05-06 LAB — COMPREHENSIVE METABOLIC PANEL
ALT: 15 U/L (ref 0–55)
AST: 21 U/L (ref 5–34)
Albumin: 4.1 g/dL (ref 3.5–5.0)
Alkaline Phosphatase: 79 U/L (ref 40–150)
Anion Gap: 8 mEq/L (ref 3–11)
BILIRUBIN TOTAL: 0.65 mg/dL (ref 0.20–1.20)
BUN: 14.3 mg/dL (ref 7.0–26.0)
CO2: 28 meq/L (ref 22–29)
CREATININE: 0.9 mg/dL (ref 0.6–1.1)
Calcium: 10 mg/dL (ref 8.4–10.4)
Chloride: 105 mEq/L (ref 98–109)
EGFR: 63 mL/min/{1.73_m2} — ABNORMAL LOW (ref >90)
GLUCOSE: 91 mg/dL (ref 70–140)
Potassium: 4.1 mEq/L (ref 3.5–5.1)
SODIUM: 141 meq/L (ref 136–145)
TOTAL PROTEIN: 7.4 g/dL (ref 6.4–8.3)

## 2017-05-06 MED ORDER — ANASTROZOLE 1 MG PO TABS: 1.0000 mg | ORAL_TABLET | Freq: Every day | ORAL | 3 refills | Status: DC

## 2017-05-06 NOTE — Progress Notes (Addendum)
[place]  [phone] [phone]  Clinic Follow up Note   Patient Care Team: Chipper Herb, MD as PCP - General (Family Medicine) Phineas Real, Belinda Block, MD as Consulting Physician (Gynecology) Erroll Luna, MD as Consulting Physician (General Surgery) Truitt Merle, MD as Consulting Physician (Hematology) Thea Silversmith, MD (Inactive) as Consulting Physician (Radiation Oncology) Tracey Kaufmann, RN as Registered Nurse Tracey Germany, RN as Registered Nurse Tracey Juarez, Tracey Blamer, NP as Nurse Practitioner (Nurse Practitioner) Tracey Dense., MD as Referring Physician (Neurology) 05/06/2017  SUMMARY OF ONCOLOGIC HISTORY: Oncology History   Breast cancer of upper-outer quadrant of left female breast [facility])   Staging form: Breast, AJCC 7th Edition     Clinical stage from 03/19/2015: Stage IIA (T2, N0, M0) - Signed by Truitt Merle, MD on 03/27/2015     Pathologic stage from 04/27/2015: Stage IA (T1c, N0, cM0) - Signed by Enid Cutter, MD on 05/01/2015       Staging comments: Staged on final surgical specimen by Dr. Donato Heinz.          Breast cancer of upper-outer quadrant of left female breast ([place])   03/06/2015 Mammogram    Hypoechoic area in the left breast 1:00 position, 3 cm from the nipple, measuring 1.9 x 1.1 x 1.2 cm, corresponding to an area of architectural distortion on mammogram.      03/19/2015 Initial Biopsy    Left breast core needle biopsy showed invasive lobular carcinoma, grade 1-2, lobular carcinoma in situ. ER 100% positive, PR 50% positive, HER-2 negative, Ki-67 5%.      03/22/2015 Breast MRI    2.1 x 1.2 x 1.7 cm irregular enhancement within the upper outer left breast compatible with biopsy-proven malignancy. No other abnormalities.      03/22/2015 Clinical Stage    Stage IIA: T2 N0      04/25/2015 Definitive Surgery    Left breast lumpectomy / SLNB: invasive lobular carcinoma, grade 1, margins were negative. LCIS; 3 sentinel  lymph nodes were negative (0/3), HER2/neu repeated and negative      04/25/2015 Pathologic Stage    Stage IA: T1c N0      04/25/2015 Oncotype testing    Recurrence score 15, which predicts 10% 10 year risk of distant recurrence with tamoxifen alone.      06/06/2015 - 06/27/2015 Radiation Therapy    Adjuvant RT: Left breast 42.72 Gy over 16 fractions      08/12/2015 -  Anti-estrogen oral therapy    Anastrozole 1 mg daily.      09/20/2015 Survivorship    Survivorship care plan completed and mailed to patient in lieu of in person visit at pt request.      09/24/2016 Imaging    DEXA ASSESSMENT: The probability of a major osteoporotic fracture is 19.3% within the next ten years The probability of a hip fracture is 4.4% within the next ten years [facility] AP Spine L1-L3 09/24/2016 74.3 years Osteopenia -1.3 1.019 g/cm2 -12.2% * AP Spine L1-L3 11/30/2013 71[age]5 years Normal -0.2 1.161 g/cm2 6.0% * AP Spine L1-L3 01/29/2011 68.7 years Normal -0.7 1.095 g/cm2 -7.7% * AP Spine L1-L3 10/13/2005 63.4 years Normal 0.0 1.186 g/cm2 - DualFemur Neck Left 09/24/2016 74.3 years Osteopenia -2.0 0.762 g/cm2 -6.6% * DualFemur Neck Left 11/30/2013 71.5 years Osteopenia -1.6 0.816 g/cm2 2.5% DualFemur Neck Left 01/29/2011 68.7 years Osteopenia -1.7 0.796 g/cm2 -3.5% DualFemur Neck Left 10/13/2005 63.4 years Osteopenia -1.5 0.825 g/cm2 - The BMD measured at Femur Neck  Left is 0.762 g/cm2 with a T-score of -2.0. This patient is considered osteopenic according to [facility]) criteria.       04/15/2017 Imaging    DIAG  breast TOMO bilateral FINDINGS: The left lumpectomy site is stable. Scarring in the medial left breast is unchanged for many years with an adjacent dystrophic calcification. No other suspicious findings or changes.  Mammographic images were processed with CAD.  IMPRESSION: No mammographic evidence of malignancy.      HISTORY OF PRESENTING ILLNESS:    Tracey Juarez 76 y.o. female is here because of recently diagnosed left breast cancer. She presents to our multidisciplinary clinic with her husband.  This was discovered by screening mammogram, she did not feel the lump. She denies any skin or nipple change, no constitutional symptoms. The moment mammogram and ultrasound revealed a 1.9 cm mass at 1:00 position of left breast, core needle biopsy showed invasive lobular carcinoma. ER/PR stripe positive, HER-2 negative. She underwent breast MI after biopsy, which showed a 2.1 cm irregular enhancement mass in the upper outer left breast.   She has Parkinson disesease which diagnosed 5 years ago, mild, very physically active. She has no family history of breast or ovary cancer. She has no children.  CURRENT THERAPY: anastrozole 1 mg once daily, satarted on 06/11/2016  INTERVAL HISTORY: [name] returns for follow-up today as scheduled, she presents with her husband. She was last seen on 09/18/2016 and has been feeling well in the interim. She is tolerating anastrozole well. She is up-to-date on mammograms and DEXA scan. She denies fever, fatigue, or change in appetite. She does dancing with her husband 2 times per week. She has manageable hot flashes after showering while drying her hair. She has mild leg cramps, she attributes mostly to Parkinson's. Her Parkinson's disease is overall stable, medicated and managed by neurology. She has chronic joint aches, x-ray per PCP confirms arthritis. She got a steroid injection 2 weeks ago with moderate relief. Her mood is stable, she denies anxiety or depression. Gynecologist performed a breast exam in 03/2017 which was normal, per patient. She denies pain or changes in her breast.   REVIEW OF SYSTEMS:   Constitutional: Denies fatigue, fevers, chills or abnormal weight loss (+) mild hot flashes Eyes: Denies blurriness of vision Ears, nose, mouth, throat, and face: Denies mucositis or sore  throat Respiratory: Denies cough, dyspnea or wheezes Cardiovascular: Denies palpitation, chest discomfort or lower extremity swelling Gastrointestinal:  Denies nausea, vomiting, constipation, diarrhea, heartburn or change in bowel habits Skin: Denies abnormal skin rashes Lymphatics: Denies new lymphadenopathy or easy bruising Neurological:Denies numbness, tingling or new weaknesses (+) tremor (+) Parkinson's disease, overall stable Behavioral/Psych: Mood is stable, no new changes. 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  MEDICAL HISTORY:  Past Medical History:  Diagnosis Date  . Atrophic vaginitis   . Breast cancer in female [facility])   . Breast cancer of upper-outer quadrant of left female breast ([place]) 03/20/2015  . Detrusor instability   . Elevated cholesterol   . GERD (gastroesophageal reflux disease)   . Hot flashes   . Humerus head fracture, left, with routine healing, subsequent encounter 2000  . Hypertension   . Melanoma ([place])   . Mixed basal-squamous cell carcinoma    skin  . Parkinson disease ([place])   . Stroke ([place])    on Plavix, no deficits   SURGICAL HISTORY:  Past Surgical History:  Procedure Laterality Date  . BREAST LUMPECTOMY Left 04/2015   radiation  . BREAST LUMPECTOMY WITH RADIOACTIVE SEED AND SENTINEL LYMPH NODE BIOPSY Left 04/25/2015   Procedure: LEFT BREAST PARTIAL MASTECTOMY WITH RADIOACTIVE SEED AND SENTINEL LYMPH NODE MAPPING;  Surgeon: Erroll Luna, MD;  Location: [place];  Service: General;  Laterality: Left;  . FOOT SURGERY  2004  . KNEE SURGERY  2007  . skin cancer excised    . TONSILLECTOMY     I have reviewed the social history and family history with the patient and they are unchanged from previous note.  ALLERGIES:  has No Known Allergies.  MEDICATIONS:  Current Outpatient Prescriptions  Medication Sig Dispense Refill  .  anastrozole (ARIMIDEX) 1 MG tablet Take 1 tablet (1 mg total) by mouth daily. 90 tablet 3  . AZILECT 1 MG TABS tablet Take 1 tablet by mouth daily.    . Cholecalciferol (VITAMIN D) 2000 UNITS CAPS Take 1 capsule by mouth daily.    . clopidogrel (PLAVIX) 75 MG tablet Take 75 mg by mouth daily.    . diphenhydrAMINE (BENADRYL) 25 mg capsule Take 25 mg by mouth every 6 (six) hours as needed (for parkinson's symptoms).     Marland Kitchen lisinopril (PRINIVIL,ZESTRIL) 10 MG tablet Take 1 tablet (10 mg total) by mouth daily. 90 tablet 3  . Magnesium 250 MG TABS Take 1 tablet by mouth daily.    . Multiple Vitamin (MULTIVITAMIN) capsule Take 1 capsule by mouth daily.      Marland Kitchen MYRBETRIQ 25 MG TB24 tablet TAKE ONE TABLET BY MOUTH ONCE DAILY 30 tablet 0  . pramipexole (MIRAPEX) 0.125 MG tablet Take 0.125 mg by mouth 3 (three) times daily.    . ranitidine (ZANTAC) 150 MG tablet Take 150 mg by mouth 2 (two) times daily as needed.     . Risedronate Sodium (ATELVIA) 35 MG TBEC Take 1 tablet (35 mg total) by mouth once a week. 4 tablet 3  . rosuvastatin (CRESTOR) 10 MG tablet Take 1 tablet (10 mg total) by mouth daily. 90 tablet 1   No current facility-administered medications for this visit.    PHYSICAL EXAMINATION: ECOG PERFORMANCE STATUS: 0 - Asymptomatic  Vitals:   05/06/17 1117  BP: (!) 162/70  Pulse: 77  Resp: 18  Temp: 98.8 F (37.1 C)  SpO2: 99%   Filed Weights   05/06/17 1117  Weight: 154 lb 1.6 oz (69.9 kg)   GENERAL:alert, no distress and comfortable SKIN: skin color, texture, turgor are normal, no rashes or significant lesions EYES: normal, Conjunctiva are pink and non-injected, sclera clear OROPHARYNX:no exudate, no erythema and lips, buccal mucosa, and tongue normal  NECK: supple, thyroid normal size, non-tender, without nodularity LYMPH:  no palpable Cervical or supraclavicular lymphadenopathy  LUNGS: clear to auscultation bilaterally with normal breathing effort HEART: regular rate & rhythm,  S1 and S2 present, no murmurs and no lower extremity edema ABDOMEN:abdomen soft, non-tender and normal bowel sounds. Exam limited, patient in wheelchair Musculoskeletal:no cyanosis of digits and no clubbing  NEURO: alert & oriented x 3 with fluent speech, no focal sensory deficits. Resting tremor secondary to parkinson's disease.  BREAST: exam deferred per patient request. Breast exam done per gyn in 03/2017.   LABORATORY DATA:  I have reviewed the data as listed CBC Latest Ref Rng & Units 05/06/2017 03/04/2017 09/18/2016  WBC 3.9 - 10.3 10e3/uL 6.0 4.8 4.6  Hemoglobin 11.6 - 15.9 g/dL 13.1 13.4 13.7  Hematocrit 34.8 -  46.6 % 40.4 40.4 41.0  Platelets 145 - 400 10e3/uL 204 270 244   CMP Latest Ref Rng & Units 05/06/2017 04/20/2017 03/04/2017  Glucose 70 - 140 mg/dl 91 - 92  BUN 7.0 - 26.0 mg/dL 14.3 - 12  Creatinine 0.6 - 1.1 mg/dL 0.9 - 0.92  Sodium 136 - 145 mEq/L 141 - 144  Potassium 3.5 - 5.1 mEq/L 4.1 - 4.3  Chloride 96 - 106 mmol/L - - 104  CO2 22 - 29 mEq/L 28 - 24  Calcium 8.4 - 10.4 mg/dL 10.0 - 10.1  Total Protein 6.4 - 8.3 g/dL 7.4 6.6 6.9  Total Bilirubin 0.20 - 1.20 mg/dL 0.65 0.3 0.4  Alkaline Phos 40 - 150 U/L 79 75 83  AST 5 - 34 U/L '21 19 23  ' ALT 0 - 55 U/L '15 16 13   ' PATHOLOGY REPORT: Diagnosis 04/25/2015 1. Breast, lumpectomy, left - INVASIVE LOBULAR CARCINOMA, SEE COMMENT. 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 - PREVIOUS BIOPSY SITE. - SEE TUMOR SYNOPTIC TEMPLATE BELOW. 2. Lymph node, sentinel, biopsy, left - ONE LYMPH NODE, NEGATIVE FOR TUMOR (0/1) SEE COMMENT. 3. Lymph node, sentinel, biopsy, left - ONE LYMPH NODE, NEGATIVE FOR TUMOR (0/1) SEE COMMENT. 4. Lymph node, sentinel, biopsy, left - ONE LYMPH NODE, NEGATIVE FOR TUMOR (0/1) SEE COMMENT. Specimen, including laterality and lymph node sampling (sentinel, non-sentinel): Left breat with sentinel lymph node sampling. Procedure: Lumpectomy. Histologic type:  Lobular. Grade: 1 of 3 Tubule formation: 3 Nuclear pleomorphism: 1 Mitotic:1 Tumor size (gross measurement): 1.8 cm Margins: Invasive, distance to closest margin: 0.5 cm (anterior) In-situ, distance to closest margin: N/A If margin positive, focally or broadly: N/A Lymphovascular invasion: Absent. Ductal carcinoma in situ: Absent. Grade: Extensive. Extensive intraductal component: N/A Lobular neoplasia: Present. Tumor focality: Unifocal. Treatment effect: None. If present, treatment effect in breast tissue, lymph nodes or both: N/A Extent of tumor: Skin: N/A Nipple: N/A Skeletal muscle: N/A Lymph nodes: Examined: 3 Sentinel 0 Non-sentinel 3 Total Lymph nodes with metastasis: 0 Isolated tumor cells (< 0.2 mm): N/A Micrometastasis: (> 0.2 mm and < 2.0 mm): N/A Macrometastasis: (> 2.0 mm): N/A Extracapsular extension: N/A Breast prognostic profile: Estrogen receptor: Not repeated, previous study demonstrated 100% positivity, [id number]. Progesterone receptor: Not repeated, previous study demonstrated 50% positivity, [id number]. Her 2 neu: Repeated, previous study demonstrated no amplification, [id number]. Ki-67: Not repeated, previous study demonstrated 5% proliferation rate. Non-neoplastic breast: Previous biopsy site, fibrocystic change, benign radial scar, calcifications. TNM: pT1c, pN0, pMX Comments: None. 2. - 4. There are no intranodal malignant epithelial tumor deposits identified on routine histology or with cytokeratin AE1/3 immunostains. (CRR:ecj 04/26/2015)  Results: HER2 - NEGATIVE RATIO OF HER2/CEP17 SIGNALS 1.69 AVERAGE HER2 COPY NUMBER PER CELL 2.45  Oncotype RS 15  RADIOGRAPHIC STUDIES: I have personally reviewed the radiological images as listed and agreed with the findings in the report.  MM Diag breast tomo bilateral 04/15/2017 FINDINGS: The left lumpectomy site is stable. Scarring in the medial left breast is unchanged for many years with an  adjacent dystrophic calcification. No other suspicious findings or changes. Mammographic images were processed with CAD.  IMPRESSION: No mammographic evidence of malignancy.  RECOMMENDATION: Annual diagnostic mammography.  MM Diag breast tomo bilateral 03/25/2016 IMPRESSION: No evidence of recurrent or new breast malignancy. Benign postsurgical changes on the left.  RECOMMENDATION: Diagnostic mammography in 1 year per standard post lumpectomy protocol.  ASSESSMENT & PLAN: 76 y.o. Caucasian female, postmenopausal, presented with screening detected left breast  cancer.  ASSESSMENT & PLAN:  1. Left breast invasive lobular carcinoma, pT1cN0M0, stage IA, ER/PR positive, HER-2 negative, Ki-67 5%, Oncotype low risk -She had a complete surgical resection, margins were negative, sentinel lymph nodes were negative, she had low-grade stage I breast cancer, likely cured by surgery alone. -Dr. Burr Medico previously discussed the small risk of cancer recurrence after her surgery and reviewed her Oncotype DX test results, the recurrence score is 15, which predicts 10% 10 year distant recurrence with tamoxifen alone.  -She is currently on adjuvant anastrozole, tolerating it well without significant side effects; she will continue for total 5 years. -I recommend continuing breast cancer surveillance with annual mammogram, self exam, and routine follow up with [place].  -She has Parkinson's disease, making travel difficult, and lives 50 miles away, she continues close f/u with PCP, GYN, and neurology. She would like to f/u in 1 year rather than 6 months; I agree with this -She is clinically doing well, last mammogram 04/2017 negative, lab and physical exam are unremarkable. Breast exam deferred, GYN performed recently. No clinical evidence of recurrence.  -We'll continue surveillance, next mammogram in 04/2018 and will f/u wit [place] few days after  2. Osteopenia -Currently on Risedronate per PCP for osteopenia and  the probability of a hip fracture is 4.4% within the next 10 years and the probability of a major osteoporotic fracture is 19.3% within the next ten years  -we will follow up her bone density scan every 2 years; she continues calcium and vitamin D  3. Parkinson disease, hypertension -overall stable, she continues close f/u with neurology and PCP -BP elevated 162/70 today, likely increased from long walk from lobby to exam room -patient monitors this at home and has been normal  4. Hot flash, leg cramps  -overall stable and mild; no intervention necessary at this time  PLAN: -continue anastrozole, refilled today for 1 year supply -return in 1 year, continue f/u with PCP, Gyn, and Neuro as instructed -Annual mammo in 04/2018   All questions were answered. The patient knows to call the clinic with any problems, questions or concerns. No barriers to learning was detected.  I spent 15 minutes counseling the patient face to face. The total time spent in the appointment was 20 minutes and more than 50% was on counseling and review of test results  Cira Rue NP  I have seen the patient, examined her. I agree with the assessment and and plan and have edited the notes.    Truitt Merle, MD 05/06/17

## 2017-05-06 NOTE — Telephone Encounter (Signed)
Gave patient AVS and calendar of upcoming September 2019 appointments.  °

## 2017-05-20 DIAGNOSIS — Z23 Encounter for immunization: Secondary | ICD-10-CM | POA: Diagnosis not present

## 2017-07-06 DIAGNOSIS — G20 Parkinson's disease: Secondary | ICD-10-CM | POA: Diagnosis not present

## 2017-07-10 ENCOUNTER — Encounter: Payer: Self-pay | Admitting: Family Medicine

## 2017-07-10 ENCOUNTER — Ambulatory Visit (INDEPENDENT_AMBULATORY_CARE_PROVIDER_SITE_OTHER): Payer: Medicare Other | Admitting: Family Medicine

## 2017-07-10 VITALS — BP 152/78 | HR 76 | Temp 97.6°F | Ht 62.0 in | Wt 158.0 lb

## 2017-07-10 DIAGNOSIS — G20 Parkinson's disease: Secondary | ICD-10-CM | POA: Diagnosis not present

## 2017-07-10 DIAGNOSIS — J029 Acute pharyngitis, unspecified: Secondary | ICD-10-CM | POA: Diagnosis not present

## 2017-07-10 LAB — CULTURE, GROUP A STREP

## 2017-07-10 LAB — RAPID STREP SCREEN (MED CTR MEBANE ONLY): STREP GP A AG, IA W/REFLEX: NEGATIVE

## 2017-07-10 MED ORDER — CELECOXIB 200 MG PO CAPS: 200.0000 mg | ORAL_CAPSULE | Freq: Every day | ORAL | 0 refills | Status: DC

## 2017-07-10 MED ORDER — FLUTICASONE PROPIONATE 50 MCG/ACT NA SUSP: 2.0000 | Freq: Every day | NASAL | 6 refills | Status: DC

## 2017-07-10 NOTE — Progress Notes (Signed)
Chief Complaint  Patient presents with  . Sore Throat    sratchy throat , runny nose - started yesterday     HPI  Patient presents today for scratchy throat and stuffy runny nose with mild dry cough starting yesterday. Concerned that her parkinsonism meds are not compatible with tx for URI.Would like suggestions  PMH: Smoking status noted ROS: Per HPI  Objective: BP (!) 152/78 (BP Location: Right Arm)   Pulse 76   Temp 97.6 F (36.4 C) (Oral)   Ht 5\' 2"  (1.575 m)   Wt 158 lb (71.7 kg)   BMI 28.90 kg/m  Gen: NAD, alert, cooperative with exam HEENT: NCAT, EOMI, PERRL mild posterior pharyngeal edema.  Nasal passages a bit boggy.  No anterior cervical adenopathy. CV: RRR, good S1/S2, no murmur Resp: CTABL, no wheezes, non-labored Abd: SNTND, BS present, no guarding or organomegaly Ext: No edema, warm Neuro: Alert and oriented, No gross deficits  Assessment and plan:  1. Sore throat   2. Parkinson disease ([place])     Meds ordered this encounter  Medications  . celecoxib (CELEBREX) 200 MG capsule    Sig: Take 1 capsule (200 mg total) by mouth daily.    Dispense:  10 capsule    Refill:  0  . fluticasone (FLONASE) 50 MCG/ACT nasal spray    Sig: Place 2 sprays into both nostrils daily.    Dispense:  16 g    Refill:  6    Orders Placed This Encounter  Procedures  . Rapid Strep Screen (Not at [facility])  . Culture, Group A Strep    Follow up as needed.  Claretta Fraise, MD

## 2017-07-10 NOTE — Patient Instructions (Signed)

## 2017-08-26 ENCOUNTER — Encounter: Payer: Self-pay | Admitting: Family Medicine

## 2017-08-26 ENCOUNTER — Ambulatory Visit (INDEPENDENT_AMBULATORY_CARE_PROVIDER_SITE_OTHER): Payer: Medicare Other | Admitting: Family Medicine

## 2017-08-26 VITALS — BP 141/79 | HR 76 | Temp 98.6°F | Ht 62.0 in | Wt 157.0 lb

## 2017-08-26 DIAGNOSIS — H8110 Benign paroxysmal vertigo, unspecified ear: Secondary | ICD-10-CM

## 2017-08-26 MED ORDER — PREDNISONE 20 MG PO TABS: ORAL_TABLET | ORAL | 0 refills | Status: DC

## 2017-08-26 MED ORDER — MECLIZINE HCL 25 MG PO TABS: 25.0000 mg | ORAL_TABLET | Freq: Three times a day (TID) | ORAL | 0 refills | Status: DC | PRN

## 2017-08-26 NOTE — Progress Notes (Signed)
BP (!) 141/79   Pulse 76   Temp 98.6 F (37 C) (Oral)   Ht 5\' 2"  (1.575 m)   Wt 157 lb (71.2 kg)   BMI 28.72 kg/m    Subjective:    Patient ID: Tracey Juarez, female    DOB: 1940/10/27, 77 y.o.   MRN: [id number]  HPI: Tracey Juarez is a 77 y.o. female presenting on 08/26/2017 for Dizziness (x 2 weeks, comes and goes) and Sinus pressure, left ear feels unusual   HPI Dizziness Patient comes in complaining of dizziness is been gradually worsening over the past 2 weeks.  She describes it as she feels like things are moving sensation and she feels like her balance is off as well.  She does have known Parkinson's disease.  She says is been going on for 2 weeks but is really worsened over the past couple days.  She also has been having some sinus congestion and drainage and ringing in her left ear that has been newer as well.  She does have chronic sinus congestion but it has worsened over the past couple weeks as well.  She denies any fevers or chills or shortness of breath or wheezing.  She has been taking Dramamine over-the-counter for this and some supplemental oil that she puts behind her ear for this.  Relevant past medical, surgical, family and social history reviewed and updated as indicated. Interim medical history since our last visit reviewed. Allergies and medications reviewed and updated.  Review of Systems  Constitutional: Negative for chills and fever.  HENT: Positive for congestion and sinus pressure. Negative for ear discharge and ear pain.   Eyes: Negative for visual disturbance.  Respiratory: Negative for cough, chest tightness and shortness of breath.   Cardiovascular: Negative for chest pain and leg swelling.  Musculoskeletal: Negative for back pain and gait problem.  Skin: Negative for rash.  Neurological: Positive for dizziness. Negative for light-headedness and headaches.  Psychiatric/Behavioral: Negative for agitation and behavioral problems.  All other  systems reviewed and are negative.   Per HPI unless specifically indicated above     Objective:    BP (!) 141/79   Pulse 76   Temp 98.6 F (37 C) (Oral)   Ht 5\' 2"  (1.575 m)   Wt 157 lb (71.2 kg)   BMI 28.72 kg/m   Wt Readings from Last 3 Encounters:  08/26/17 157 lb (71.2 kg)  07/10/17 158 lb (71.7 kg)  05/06/17 154 lb 1.6 oz (69.9 kg)    Physical Exam  Constitutional: She is oriented to person, place, and time. She appears well-developed and well-nourished. No distress.  Eyes: Conjunctivae are normal.  Neck: Neck supple. No thyromegaly present.  Cardiovascular: Normal rate, regular rhythm, normal heart sounds and intact distal pulses.  No murmur heard. Pulmonary/Chest: Effort normal and breath sounds normal. No respiratory distress. She has no wheezes. She has no rales.  Musculoskeletal: Normal range of motion.  Lymphadenopathy:    She has no cervical adenopathy.  Neurological: She is alert and oriented to person, place, and time. Coordination normal.  Skin: Skin is warm and dry. No rash noted. She is not diaphoretic.  Psychiatric: She has a normal mood and affect. Her behavior is normal.  Nursing note and vitals reviewed.     Assessment & Plan:   Problem List Items Addressed This Visit    None    Visit Diagnoses    BPPV (benign paroxysmal positional vertigo), unspecified laterality    -  Primary   Concern for also possible labyrinthitis versus a flareup of her Parkinson's   Relevant Medications   meclizine (ANTIVERT) 25 MG tablet   predniSONE (DELTASONE) 20 MG tablet       Follow up plan: Return if symptoms worsen or fail to improve.  Counseling provided for all of the vaccine components No orders of the defined types were placed in this encounter.   Caryl Pina, MD [facility] 08/26/2017, 9:58 AM

## 2017-09-05 NOTE — Progress Notes (Signed)
Subjective:    Patient ID: Tracey Juarez, female    DOB: 1941-03-08, 77 y.o.   MRN: [id number]  HPI  Pt here for follow up and management of chronic medical problems. Problems include hypertension, Parkinson's, hyperlipidemia, Vit D deficiency and overactive bladder.  She is doing well overall.  She does complain of some sinus drainage and left ear pain.  She will get lab work today.  Her vital signs are stable.  Her medicines have been reviewed.  The patient has had and is being followed for the melanoma and breast cancer.  She is on Plavix for a resolved CVA.  The patient sees Dr. Bjorn Loser in [place] for her Parkinson's.  She sees him regularly.  She recently saw someone in this office for her dizziness which seem to be getting better but has gotten worse again.  We will refill her meclizine.  She denies any chest pain or shortness of breath.  She has had a slight cough from a tickle in her throat but is not bringing up any sputum.  She does have occasional heartburn but only secondary to eating certain foods which she knows that she should avoid.  She takes ranitidine for this.  She has not had any blood in the stool or black tarry bowel movements or no nausea or vomiting.  She is passing her water well other than just having frequency.  The sinus drainage is slightly yellow and her ear is bothersome especially when she lays on the left side.  The she has roaring on the side.    Review of Systems  Constitutional: Positive for fatigue (slight).  HENT: Positive for congestion, ear pain (Left x 1 mth) and postnasal drip.   Eyes: Positive for discharge.  Respiratory: Negative.   Gastrointestinal: Negative.   Genitourinary: Positive for urgency (taking Mybetriq).  Musculoskeletal: Positive for arthralgias (bilateral knees, occasionally).  Neurological: Positive for dizziness (slight, intermitent), tremors and headaches (occasional).       Objective:   Physical Exam  Constitutional: She is  oriented to person, place, and time. She appears well-developed and well-nourished. No distress.  The patient is pleasant calm and relaxed with slight tremor in the right hand  HENT:  Head: Normocephalic and atraumatic.  Right Ear: External ear normal.  Mouth/Throat: Oropharynx is clear and moist.  Minimal ear wax left EAC with normal eardrum some nasal congestion bilaterally  Eyes: Conjunctivae and EOM are normal. Pupils are equal, round, and reactive to light. Right eye exhibits no discharge. Left eye exhibits no discharge. No scleral icterus.  Neck: Normal range of motion. Neck supple. No thyromegaly present.  No bruits anterior cervical adenopathy or thyromegaly  Cardiovascular: Normal rate, regular rhythm, normal heart sounds and intact distal pulses.  No murmur heard. The heart is regular at 72/min  Pulmonary/Chest: Effort normal and breath sounds normal. No respiratory distress. She has no wheezes. She has no rales.  Clear anteriorly and posteriorly  Abdominal: Soft. Bowel sounds are normal. She exhibits no mass. There is no tenderness. There is no rebound and no guarding.  No abdominal tenderness masses or bruits or organ enlargement  Musculoskeletal: Normal range of motion. She exhibits no edema.  Some rigidity and slowness with movement but minimally so.  Lymphadenopathy:    She has no cervical adenopathy.  Neurological: She is alert and oriented to person, place, and time. She has normal reflexes. No cranial nerve deficit.  Skin: Skin is warm and dry. No rash noted.  Psychiatric: She  has a normal mood and affect. Her behavior is normal. Judgment and thought content normal.  Nursing note and vitals reviewed.  BP 132/81 (BP Location: Right Arm, Patient Position: Sitting, Cuff Size: Normal)   Pulse 89   Temp 97.7 F (36.5 C) (Oral)   Ht 5\' 2"  (1.575 m)   Wt 157 lb 9.6 oz (71.5 kg)   BMI 28.83 kg/m         Assessment & Plan:  1. Other fatigue - CBC with  Differential/Platelet - Thyroid Panel With TSH  2. Other hyperlipidemia -10 you current treatment pending results of lab work - Basic Metabolic Panel - Lipid panel - Hepatic function panel  3. Essential hypertension -Blood pressure is good and she will continue with current treatment - Basic Metabolic Panel - Lipid panel - Hepatic function panel  4. Malignant neoplasm of upper-outer quadrant of left female breast, unspecified estrogen receptor status ([place]) -Follow-up with surgeon as planned  5. 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  6. Melanoma of skin ([place]) -We will follow-up with dermatology as planned  7. Parkinson disease ([place]) -Follow-up with Dr. Bjorn Loser as planned  8. Primary osteoarthritis of right knee -Tylenol as needed for pain and regular walking activity  9. Upper respiratory tract infection, unspecified type -Take amoxicillin 500 3 times daily and continue to to take meclizine for 1 more week and drink plenty of fluids and stay well-hydrated  No orders of the defined types were placed in this encounter.  Patient Instructions  Continue current medications. Continue good therapeutic lifestyle changes which include good diet and exercise. Fall precautions discussed with patient. If an FOBT was given today- please return it to our front desk. If you are over 74 years old - you may need Prevnar 71 or the adult Pneumonia vaccine.  **Flu shots are available--- please call and schedule a FLU-CLINIC appointment**  After your visit with [place] today you will receive a survey in the mail or online from [employer] regarding your care with [place]. Please take a moment to fill this out. Your feedback is very important to [place] as you can help [place] better understand your patient needs as well as improve your experience and satisfaction. WE CARE ABOUT YOU!!!   Continue to follow-up with neurology oncologist and surgeon. Stay active physically and drink plenty of  fluids Continue to use nasal saline Take amoxicillin 500  3 times daily until completed Take meclizine 3 times daily with food for 7 days then as needed for dizziness   Arrie Senate MD

## 2017-09-07 ENCOUNTER — Ambulatory Visit (INDEPENDENT_AMBULATORY_CARE_PROVIDER_SITE_OTHER): Payer: Medicare Other | Admitting: Family Medicine

## 2017-09-07 ENCOUNTER — Encounter: Payer: Self-pay | Admitting: Family Medicine

## 2017-09-07 VITALS — BP 132/81 | HR 89 | Temp 97.7°F | Ht 62.0 in | Wt 157.6 lb

## 2017-09-07 DIAGNOSIS — M1711 Unilateral primary osteoarthritis, right knee: Secondary | ICD-10-CM | POA: Diagnosis not present

## 2017-09-07 DIAGNOSIS — E7849 Other hyperlipidemia: Secondary | ICD-10-CM

## 2017-09-07 DIAGNOSIS — C439 Malignant melanoma of skin, unspecified: Secondary | ICD-10-CM | POA: Diagnosis not present

## 2017-09-07 DIAGNOSIS — R5383 Other fatigue: Secondary | ICD-10-CM

## 2017-09-07 DIAGNOSIS — G20A1 Parkinson's disease without dyskinesia, without mention of fluctuations: Secondary | ICD-10-CM

## 2017-09-07 DIAGNOSIS — I10 Essential (primary) hypertension: Secondary | ICD-10-CM | POA: Diagnosis not present

## 2017-09-07 DIAGNOSIS — C50412 Malignant neoplasm of upper-outer quadrant of left female breast: Secondary | ICD-10-CM | POA: Diagnosis not present

## 2017-09-07 DIAGNOSIS — J069 Acute upper respiratory infection, unspecified: Secondary | ICD-10-CM

## 2017-09-07 DIAGNOSIS — G20 Parkinson's disease: Secondary | ICD-10-CM

## 2017-09-07 DIAGNOSIS — D692 Other nonthrombocytopenic purpura: Secondary | ICD-10-CM | POA: Diagnosis not present

## 2017-09-07 MED ORDER — MECLIZINE HCL 25 MG PO TABS: 25.0000 mg | ORAL_TABLET | Freq: Three times a day (TID) | ORAL | 0 refills | Status: DC | PRN

## 2017-09-07 MED ORDER — AMOXICILLIN 500 MG PO CAPS: 500.0000 mg | ORAL_CAPSULE | Freq: Three times a day (TID) | ORAL | 0 refills | Status: DC

## 2017-09-07 NOTE — Patient Instructions (Addendum)
Continue current medications. Continue good therapeutic lifestyle changes which include good diet and exercise. Fall precautions discussed with patient. If an FOBT was given today- please return it to our front desk. If you are over 76 years old - you may need Prevnar 41 or the adult Pneumonia vaccine.  **Flu shots are available--- please call and schedule a FLU-CLINIC appointment**  After your visit with [place] today you will receive a survey in the mail or online from [employer] regarding your care with [place]. Please take a moment to fill this out. Your feedback is very important to [place] as you can help [place] better understand your patient needs as well as improve your experience and satisfaction. WE CARE ABOUT YOU!!!   Continue to follow-up with neurology oncologist and surgeon. Stay active physically and drink plenty of fluids Continue to use nasal saline Take amoxicillin 500  3 times daily until completed Take meclizine 3 times daily with food for 7 days then as needed for dizziness

## 2017-09-08 LAB — CBC WITH DIFFERENTIAL/PLATELET
BASOS ABS: 0 10*3/uL (ref 0.0–0.2)
Basos: 0 %
EOS (ABSOLUTE): 0.2 10*3/uL (ref 0.0–0.4)
Eos: 2 %
Hematocrit: 41.7 % (ref 34.0–46.6)
Hemoglobin: 13.8 g/dL (ref 11.1–15.9)
IMMATURE GRANS (ABS): 0 10*3/uL (ref 0.0–0.1)
Immature Granulocytes: 0 %
LYMPHS ABS: 1.2 10*3/uL (ref 0.7–3.1)
LYMPHS: 18 %
MCH: 30.7 pg (ref 26.6–33.0)
MCHC: 33.1 g/dL (ref 31.5–35.7)
MCV: 93 fL (ref 79–97)
Monocytes Absolute: 0.8 10*3/uL (ref 0.1–0.9)
Monocytes: 12 %
NEUTROS ABS: 4.7 10*3/uL (ref 1.4–7.0)
Neutrophils: 68 %
PLATELETS: 234 10*3/uL (ref 150–379)
RBC: 4.5 x10E6/uL (ref 3.77–5.28)
RDW: 13.8 % (ref 12.3–15.4)
WBC: 6.9 10*3/uL (ref 3.4–10.8)

## 2017-09-08 LAB — BASIC METABOLIC PANEL
BUN / CREAT RATIO: 15 (ref 12–28)
BUN: 15 mg/dL (ref 8–27)
CHLORIDE: 101 mmol/L (ref 96–106)
CO2: 25 mmol/L (ref 20–29)
CREATININE: 1.01 mg/dL — AB (ref 0.57–1.00)
Calcium: 10.1 mg/dL (ref 8.7–10.3)
GFR calc non Af Amer: 55 mL/min/{1.73_m2} — ABNORMAL LOW (ref >59)
GFR, EST AFRICAN AMERICAN: 63 mL/min/{1.73_m2} (ref >59)
Glucose: 94 mg/dL (ref 65–99)
Potassium: 4.5 mmol/L (ref 3.5–5.2)
Sodium: 141 mmol/L (ref 134–144)

## 2017-09-08 LAB — HEPATIC FUNCTION PANEL
ALBUMIN: 4.3 g/dL (ref 3.5–4.8)
ALT: 17 IU/L (ref 0–32)
AST: 17 IU/L (ref 0–40)
Alkaline Phosphatase: 83 IU/L (ref 39–117)
BILIRUBIN TOTAL: 0.4 mg/dL (ref 0.0–1.2)
Bilirubin, Direct: 0.13 mg/dL (ref 0.00–0.40)
TOTAL PROTEIN: 6.6 g/dL (ref 6.0–8.5)

## 2017-09-08 LAB — THYROID PANEL WITH TSH
Free Thyroxine Index: 2.1 (ref 1.2–4.9)
T3 UPTAKE RATIO: 26 % (ref 24–39)
T4 TOTAL: 8.2 ug/dL (ref 4.5–12.0)
TSH: 1.47 u[IU]/mL (ref 0.450–4.500)

## 2017-09-08 LAB — LIPID PANEL
Chol/HDL Ratio: 2.8 ratio (ref 0.0–4.4)
Cholesterol, Total: 184 mg/dL (ref 100–199)
HDL: 65 mg/dL (ref >39)
LDL CALC: 95 mg/dL (ref 0–99)
Triglycerides: 118 mg/dL (ref 0–149)
VLDL CHOLESTEROL CAL: 24 mg/dL (ref 5–40)

## 2017-09-20 ENCOUNTER — Other Ambulatory Visit: Payer: Self-pay | Admitting: Family Medicine

## 2017-09-20 DIAGNOSIS — J069 Acute upper respiratory infection, unspecified: Secondary | ICD-10-CM

## 2017-09-21 DIAGNOSIS — Z853 Personal history of malignant neoplasm of breast: Secondary | ICD-10-CM | POA: Diagnosis not present

## 2017-10-17 ENCOUNTER — Other Ambulatory Visit: Payer: Self-pay | Admitting: Family Medicine

## 2017-10-17 DIAGNOSIS — J069 Acute upper respiratory infection, unspecified: Secondary | ICD-10-CM

## 2017-10-28 ENCOUNTER — Ambulatory Visit (INDEPENDENT_AMBULATORY_CARE_PROVIDER_SITE_OTHER): Payer: Medicare Other | Admitting: Family Medicine

## 2017-10-28 ENCOUNTER — Encounter: Payer: Self-pay | Admitting: Family Medicine

## 2017-10-28 VITALS — BP 133/79 | HR 84 | Temp 99.4°F | Ht 62.0 in | Wt 162.0 lb

## 2017-10-28 DIAGNOSIS — J0111 Acute recurrent frontal sinusitis: Secondary | ICD-10-CM | POA: Diagnosis not present

## 2017-10-28 MED ORDER — AMOXICILLIN-POT CLAVULANATE 875-125 MG PO TABS: 1.0000 | ORAL_TABLET | Freq: Two times a day (BID) | ORAL | 0 refills | Status: DC

## 2017-10-28 MED ORDER — PREDNISONE 20 MG PO TABS: ORAL_TABLET | ORAL | 0 refills | Status: DC

## 2017-10-28 NOTE — Progress Notes (Signed)
BP 133/79   Pulse 84   Temp 99.4 F (37.4 C) (Oral)   Ht 5\' 2"  (1.575 m)   Wt 162 lb (73.5 kg)   BMI 29.63 kg/m    Subjective:    Patient ID: Tracey Juarez, female    DOB: Jan 01, 1941, 77 y.o.   MRN: [id number]  HPI: Tracey Juarez is a 77 y.o. female presenting on 10/28/2017 for Dizziness, sinus congestion and pressure (taking Zyrtec and using Flonase)   HPI Dizziness sinus congestion and pressure Patient complaint complaining of dizziness and sinus congestion and pressure in her head.  She was treated recently for this with amoxicillin and got better on the amoxicillin but it came back 3 days after finishing the amoxicillin which was about 3 days ago.  She says she feels clogged and pressure both her face and her head and feels like things are spinning and feels dizzy and when she leans forward feels lightheaded.  She denies any shortness of breath or fevers or chills.  She does have a lot of drainage and feeling congested and clogged on the back of her throat and her nasal passages.  Relevant past medical, surgical, family and social history reviewed and updated as indicated. Interim medical history since our last visit reviewed. Allergies and medications reviewed and updated.  Review of Systems  Constitutional: Negative for chills and fever.  HENT: Positive for congestion, postnasal drip, rhinorrhea, sinus pressure, sneezing and sore throat. Negative for ear discharge and ear pain.   Eyes: Negative for pain, redness and visual disturbance.  Respiratory: Positive for cough. Negative for chest tightness and shortness of breath.   Cardiovascular: Negative for chest pain and leg swelling.  Genitourinary: Negative for difficulty urinating and dysuria.  Musculoskeletal: Negative for back pain and gait problem.  Skin: Negative for rash.  Neurological: Negative for light-headedness and headaches.  Psychiatric/Behavioral: Negative for agitation and behavioral problems.  All other  systems reviewed and are negative.   Per HPI unless specifically indicated above   Allergies as of 10/28/2017   No Known Allergies     Medication List        Accurate as of 10/28/17  9:24 AM. Always use your most recent med list.          amoxicillin-clavulanate 875-125 MG tablet Commonly known as:  AUGMENTIN Take 1 tablet by mouth 2 (two) times daily.   anastrozole 1 MG tablet Commonly known as:  ARIMIDEX Take 1 tablet (1 mg total) by mouth daily.   AZILECT 1 MG Tabs tablet Generic drug:  rasagiline Take 1 tablet by mouth daily.   clopidogrel 75 MG tablet Commonly known as:  PLAVIX Take 75 mg by mouth daily.   diphenhydrAMINE 25 mg capsule Commonly known as:  BENADRYL Take 25 mg by mouth every 6 (six) hours as needed (for parkinson's symptoms).   fluticasone 50 MCG/ACT nasal spray Commonly known as:  FLONASE Place 2 sprays into both nostrils daily.   lisinopril 10 MG tablet Commonly known as:  PRINIVIL,ZESTRIL Take 1 tablet (10 mg total) by mouth daily.   Magnesium 250 MG Tabs Take 1 tablet by mouth daily.   meclizine 25 MG tablet Commonly known as:  ANTIVERT TAKE 1 TABLET BY MOUTH THREE TIMES DAILY AS NEEDED FOR DIZZINESS   multivitamin capsule Take 1 capsule by mouth daily.   MYRBETRIQ 25 MG Tb24 tablet Generic drug:  mirabegron ER TAKE 1 TABLET BY MOUTH ONCE DAILY   pramipexole 0.125 MG tablet Commonly known  as:  MIRAPEX Take 0.125 mg by mouth 3 (three) times daily.   predniSONE 20 MG tablet Commonly known as:  DELTASONE 2 po at same time daily for 5 days   ranitidine 150 MG tablet Commonly known as:  ZANTAC Take 150 mg by mouth 2 (two) times daily as needed.   Risedronate Sodium 35 MG Tbec Commonly known as:  ATELVIA Take 1 tablet (35 mg total) by mouth once a week.   rosuvastatin 10 MG tablet Commonly known as:  CRESTOR TAKE 1 TABLET BY MOUTH ONCE DAILY   Vitamin D 2000 units Caps Take 1 capsule by mouth daily.            Objective:    BP 133/79   Pulse 84   Temp 99.4 F (37.4 C) (Oral)   Ht 5\' 2"  (1.575 m)   Wt 162 lb (73.5 kg)   BMI 29.63 kg/m   Wt Readings from Last 3 Encounters:  10/28/17 162 lb (73.5 kg)  09/07/17 157 lb 9.6 oz (71.5 kg)  08/26/17 157 lb (71.2 kg)    Physical Exam  Constitutional: She is oriented to person, place, and time. She appears well-developed and well-nourished. No distress.  HENT:  Right Ear: Tympanic membrane, external ear and ear canal normal.  Left Ear: Tympanic membrane, external ear and ear canal normal.  Nose: Mucosal edema and rhinorrhea present. No epistaxis. Right sinus exhibits no maxillary sinus tenderness and no frontal sinus tenderness. Left sinus exhibits no maxillary sinus tenderness and no frontal sinus tenderness.  Mouth/Throat: Uvula is midline and mucous membranes are normal. Posterior oropharyngeal edema and posterior oropharyngeal erythema present. No oropharyngeal exudate or tonsillar abscesses.  Eyes: Conjunctivae and EOM are normal.  Cardiovascular: Normal rate, regular rhythm, normal heart sounds and intact distal pulses.  No murmur heard. Pulmonary/Chest: Effort normal and breath sounds normal. No respiratory distress. She has no wheezes.  Musculoskeletal: Normal range of motion. She exhibits no edema or tenderness.  Neurological: She is alert and oriented to person, place, and time. Coordination normal.  Skin: Skin is warm and dry. No rash noted. She is not diaphoretic.  Psychiatric: She has a normal mood and affect. Her behavior is normal.  Vitals reviewed.      Assessment & Plan:   Problem List Items Addressed This Visit    None    Visit Diagnoses    Acute recurrent frontal sinusitis    -  Primary   Relevant Medications   amoxicillin-clavulanate (AUGMENTIN) 875-125 MG tablet   predniSONE (DELTASONE) 20 MG tablet       Follow up plan: Return if symptoms worsen or fail to improve.  Counseling provided for all of the vaccine  components No orders of the defined types were placed in this encounter.   Tracey Pina, MD [facility] 10/28/2017, 9:24 AM

## 2017-10-29 DIAGNOSIS — Z85820 Personal history of malignant melanoma of skin: Secondary | ICD-10-CM | POA: Diagnosis not present

## 2017-10-29 DIAGNOSIS — Z85828 Personal history of other malignant neoplasm of skin: Secondary | ICD-10-CM | POA: Diagnosis not present

## 2017-10-29 DIAGNOSIS — L570 Actinic keratosis: Secondary | ICD-10-CM | POA: Diagnosis not present

## 2017-11-09 ENCOUNTER — Telehealth: Payer: Self-pay | Admitting: Family Medicine

## 2017-11-09 DIAGNOSIS — R42 Dizziness and giddiness: Secondary | ICD-10-CM

## 2017-11-09 NOTE — Telephone Encounter (Signed)
DR Dettinger seen for sinus issues 3/20 - please address referral for ENT - vertigo

## 2017-11-09 NOTE — Telephone Encounter (Signed)
Go ahead and do referral for ENT

## 2017-11-09 NOTE — Telephone Encounter (Signed)
Referral placed pt aware 

## 2017-11-20 ENCOUNTER — Other Ambulatory Visit: Payer: Self-pay

## 2017-11-20 MED ORDER — RISEDRONATE SODIUM 35 MG PO TBEC: 1.0000 | DELAYED_RELEASE_TABLET | ORAL | 4 refills | Status: DC

## 2017-11-26 ENCOUNTER — Ambulatory Visit (INDEPENDENT_AMBULATORY_CARE_PROVIDER_SITE_OTHER): Payer: Medicare Other | Admitting: Family Medicine

## 2017-11-26 ENCOUNTER — Encounter: Payer: Self-pay | Admitting: Family Medicine

## 2017-11-26 VITALS — BP 139/75 | HR 92 | Temp 98.9°F | Ht 62.0 in | Wt 162.0 lb

## 2017-11-26 DIAGNOSIS — M1711 Unilateral primary osteoarthritis, right knee: Secondary | ICD-10-CM | POA: Diagnosis not present

## 2017-11-26 DIAGNOSIS — H11421 Conjunctival edema, right eye: Secondary | ICD-10-CM | POA: Diagnosis not present

## 2017-11-26 MED ORDER — METHYLPREDNISOLONE ACETATE 80 MG/ML IJ SUSP
80.0000 mg | Freq: Once | INTRAMUSCULAR | Status: AC
  Administered 2017-11-26: 80 mg via INTRAMUSCULAR

## 2017-11-26 NOTE — Progress Notes (Signed)
BP 139/75   Pulse 92   Temp 98.9 F (37.2 C) (Oral)   Ht 5\' 2"  (1.575 m)   Wt 162 lb (73.5 kg)   BMI 29.63 kg/m    Subjective:    Patient ID: Tracey Juarez, female    DOB: 1941-04-15, 77 y.o.   MRN: [id number]  HPI: Tracey Juarez is a 77 y.o. female presenting on 11/26/2017 for Pain and swelling in right knee   HPI Right knee pain and swelling Patient comes in complaining of right knee pain and swelling that has been flaring up on her over the past week.  This is similar to what she had previously and she had an injection 6 months ago.  Relevant past medical, surgical, family and social history reviewed and updated as indicated. Interim medical history since our last visit reviewed. Allergies and medications reviewed and updated.  Review of Systems  Constitutional: Negative for chills and fever.  Respiratory: Negative for chest tightness and shortness of breath.   Cardiovascular: Negative for chest pain and leg swelling.  Musculoskeletal: Positive for arthralgias and joint swelling. Negative for back pain and gait problem.  Skin: Negative for rash.  Neurological: Negative for light-headedness and headaches.  Psychiatric/Behavioral: Negative for agitation and behavioral problems.  All other systems reviewed and are negative.   Per HPI unless specifically indicated above        Objective:    BP 139/75   Pulse 92   Temp 98.9 F (37.2 C) (Oral)   Ht 5\' 2"  (1.575 m)   Wt 162 lb (73.5 kg)   BMI 29.63 kg/m   Wt Readings from Last 3 Encounters:  11/26/17 162 lb (73.5 kg)  10/28/17 162 lb (73.5 kg)  09/07/17 157 lb 9.6 oz (71.5 kg)    Physical Exam  Constitutional: She is oriented to person, place, and time. She appears well-developed and well-nourished. No distress.  Eyes: Pupils are equal, round, and reactive to light. Conjunctivae and EOM are normal.  Musculoskeletal: Normal range of motion. She exhibits tenderness. She exhibits no edema.       Right knee:  She exhibits effusion. She exhibits normal range of motion, no deformity, no erythema, normal alignment, no LCL laxity, normal patellar mobility, no bony tenderness, normal meniscus and no MCL laxity. Tenderness found. Lateral joint line tenderness noted.  Neurological: She is alert and oriented to person, place, and time. Coordination normal.  Skin: Skin is warm and dry. No rash noted. She is not diaphoretic.  Psychiatric: She has a normal mood and affect. Her behavior is normal.  Nursing note and vitals reviewed.   Knee injection: Consent form signed. Risk factors of bleeding and infection discussed with patient and patient is agreeable towards injection. Patient prepped with Betadine. Lateral approach towards injection used. Injected 80 mg of Depo-Medrol and 1 mL of 2% lidocaine. Patient tolerated procedure well and no side effects from noted. Minimal to no bleeding. Simple bandage applied after.     Assessment & Plan:   Problem List Items Addressed This Visit    None    Visit Diagnoses    Primary osteoarthritis of right knee    -  Primary   Relevant Medications   methylPREDNISolone acetate (DEPO-MEDROL) injection 80 mg (Start on 11/26/2017  4:00 PM)       Follow up plan: Return if symptoms worsen or fail to improve.  Counseling provided for all of the vaccine components No orders of the defined types were placed in  this encounter.   Caryl Pina, MD [facility] 11/26/2017, 3:47 PM

## 2017-12-01 DIAGNOSIS — H40053 Ocular hypertension, bilateral: Secondary | ICD-10-CM | POA: Diagnosis not present

## 2017-12-04 ENCOUNTER — Other Ambulatory Visit: Payer: Self-pay | Admitting: Family Medicine

## 2017-12-18 DIAGNOSIS — Z23 Encounter for immunization: Secondary | ICD-10-CM | POA: Diagnosis not present

## 2017-12-23 DIAGNOSIS — G20 Parkinson's disease: Secondary | ICD-10-CM | POA: Diagnosis not present

## 2018-01-11 ENCOUNTER — Ambulatory Visit (INDEPENDENT_AMBULATORY_CARE_PROVIDER_SITE_OTHER): Payer: Medicare Other | Admitting: Family Medicine

## 2018-01-11 ENCOUNTER — Encounter: Payer: Self-pay | Admitting: Family Medicine

## 2018-01-11 ENCOUNTER — Other Ambulatory Visit: Payer: Medicare Other

## 2018-01-11 VITALS — BP 132/74 | HR 77 | Temp 98.3°F | Ht 62.0 in | Wt 158.0 lb

## 2018-01-11 DIAGNOSIS — G20 Parkinson's disease: Secondary | ICD-10-CM

## 2018-01-11 DIAGNOSIS — E7849 Other hyperlipidemia: Secondary | ICD-10-CM | POA: Diagnosis not present

## 2018-01-11 DIAGNOSIS — C439 Malignant melanoma of skin, unspecified: Secondary | ICD-10-CM | POA: Diagnosis not present

## 2018-01-11 DIAGNOSIS — M1711 Unilateral primary osteoarthritis, right knee: Secondary | ICD-10-CM | POA: Diagnosis not present

## 2018-01-11 DIAGNOSIS — M4004 Postural kyphosis, thoracic region: Secondary | ICD-10-CM

## 2018-01-11 DIAGNOSIS — D692 Other nonthrombocytopenic purpura: Secondary | ICD-10-CM

## 2018-01-11 DIAGNOSIS — M810 Age-related osteoporosis without current pathological fracture: Secondary | ICD-10-CM | POA: Insufficient documentation

## 2018-01-11 DIAGNOSIS — I10 Essential (primary) hypertension: Secondary | ICD-10-CM | POA: Diagnosis not present

## 2018-01-11 DIAGNOSIS — E559 Vitamin D deficiency, unspecified: Secondary | ICD-10-CM

## 2018-01-11 DIAGNOSIS — C50412 Malignant neoplasm of upper-outer quadrant of left female breast: Secondary | ICD-10-CM | POA: Diagnosis not present

## 2018-01-11 DIAGNOSIS — H6123 Impacted cerumen, bilateral: Secondary | ICD-10-CM

## 2018-01-11 DIAGNOSIS — G20A1 Parkinson's disease without dyskinesia, without mention of fluctuations: Secondary | ICD-10-CM

## 2018-01-11 NOTE — Progress Notes (Signed)
Subjective:    Patient ID: Tracey Juarez, female    DOB: [birth year], 77 y.o.   MRN: [id number]  HPI Pt here for follow up and management of chronic medical problems which includes hypertension and hyperlipidemia. She is taking medication regularly.  Patient is doing well overall.  She is concerned about her posture.  Chest x-ray today will be given an FOBT today and will get lab work today.  She does have Parkinson's disease and sees the neurologist regularly.  She had a DEXA scan in February 2018.  Her vital signs are stable.  The patient does or has had breast cancer.  She is also had melanoma and squamous cell carcinoma of the skin.  The patient's family history is positive for heart disease.  Also for hypertension.  The patient is taking vitamin D 3 a blood thinner Myrbetriq Mirapex and Crestor.  She is taking a REM index because of the breast cancer.  Patient recently saw the neurologist and he is trying to make some adjustments in her medicine because she is been having a lot of nausea with the medicine and he is now seeing her every 6 months.  She had her eye exam done in April.  She denies any chest pain or shortness of breath.  She does have the nausea and heartburn which seems to be associated with the Mirapex and the neurologist is adjusting that medication.  She also follows up regularly with the surgeon at [facility] and oncologist because of her breast cancer.  She does have some frequency with passing her water but denies any other issues with her intestinal tract other than the nausea.  She is concerned about her posture and a course is taking a bisphosphonate and is also taking calcium and vitamin D.  She has been taking the residual and a after eating and I am fairly certain that we need to change this to before eating on an empty stomach once weekly and we will confirm that with the pharmacist.  She still has some dizziness but this is better after taking a round of  antibiotics.     Patient Active Problem List   Diagnosis Date Noted  . 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 Pure hypercholesterolemia 03/04/2017  . Breast cancer of upper-outer quadrant of left female breast ([place]) 03/20/2015  . Osteopenia 08/15/2013  . Parkinson disease ([place])   . Hypertension   . Atrophic vaginitis   . Detrusor instability   . Melanoma ([place])   . Mixed basal-squamous cell carcinoma    Outpatient Encounter Medications as of 01/11/2018  Medication Sig  . anastrozole (ARIMIDEX) 1 MG tablet Take 1 tablet (1 mg total) by mouth daily.  . AZILECT 1 MG TABS tablet Take 1 tablet by mouth daily.  . Cholecalciferol (VITAMIN D) 2000 UNITS CAPS Take 1 capsule by mouth daily.  . clopidogrel (PLAVIX) 75 MG tablet Take 75 mg by mouth daily.  . diphenhydrAMINE (BENADRYL) 25 mg capsule Take 25 mg by mouth every 6 (six) hours as needed (for parkinson's symptoms).   . fluticasone (FLONASE) 50 MCG/ACT nasal spray Place 2 sprays into both nostrils daily.  Marland Kitchen lisinopril (PRINIVIL,ZESTRIL) 10 MG tablet Take 1 tablet (10 mg total) by mouth daily.  . Magnesium 250 MG TABS Take 1 tablet by mouth daily.  . meclizine (ANTIVERT) 25 MG tablet TAKE 1 TABLET BY MOUTH THREE TIMES DAILY AS NEEDED FOR DIZZINESS  . Multiple Vitamin (MULTIVITAMIN) capsule Take 1 capsule by mouth daily.    Marland Kitchen  MYRBETRIQ 25 MG TB24 tablet TAKE 1 TABLET BY MOUTH ONCE DAILY  . pramipexole (MIRAPEX) 0.125 MG tablet Take 0.125 mg by mouth 3 (three) times daily.  . pramipexole (MIRAPEX) 0.25 MG tablet Take 1 tablet by mouth 4 (four) times daily.  . ranitidine (ZANTAC) 150 MG tablet Take 150 mg by mouth 2 (two) times daily as needed.   . Risedronate Sodium (ATELVIA) 35 MG TBEC Take 1 tablet (35 mg total) by mouth once a week.  . rosuvastatin (CRESTOR) 10 MG tablet TAKE 1 TABLET BY MOUTH ONCE DAILY   No facility-administered encounter medications on file as of 01/11/2018.      Review of Systems  Constitutional: Negative.   HENT:  Negative.   Eyes: Negative.   Respiratory: Negative.   Cardiovascular: Negative.   Gastrointestinal: Negative.   Endocrine: Negative.   Genitourinary: Negative.   Musculoskeletal: Negative.   Skin: Negative.   Allergic/Immunologic: Negative.   Neurological: Negative.   Hematological: Negative.   Psychiatric/Behavioral: Negative.        Objective:   Physical Exam  Constitutional: She is oriented to person, place, and time. She appears well-developed and well-nourished. No distress.  The patient is pleasant and alert and try to keep up with all of her doctor's appointments for skin cancer follow-up breast cancer follow-up Parkinson's disease  HENT:  Head: Normocephalic and atraumatic.  Right Ear: External ear normal.  Left Ear: External ear normal.  Nose: Nose normal.  Mouth/Throat: Oropharynx is clear and moist. No oropharyngeal exudate.  Eyes: Pupils are equal, round, and reactive to light. Conjunctivae and EOM are normal. Right eye exhibits no discharge. Left eye exhibits no discharge. No scleral icterus.  Neck: Normal range of motion. Neck supple. No thyromegaly present.  No bruits thyromegaly or anterior cervical adenopathy  Cardiovascular: Normal rate, regular rhythm, normal heart sounds and intact distal pulses.  No murmur heard. Heart is regular at 72/min  Pulmonary/Chest: Effort normal and breath sounds normal. She has no wheezes. She has no rales.  Clear anteriorly and posteriorly  Abdominal: Soft. Bowel sounds are normal. She exhibits no mass. There is no tenderness.  No abdominal tenderness masses organ enlargement bruits or inguinal adenopathy  Genitourinary:  Genitourinary Comments: Patient tries to stay up-to-date on her mammogram and pelvic exams.  She had her last pelvic exam and August 2018 and mammogram in September 2018.  Musculoskeletal: Normal range of motion. She exhibits deformity. She exhibits no edema.  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  She is concerned  about this and is trying to remind herself to keep a better posture at all times.  Lymphadenopathy:    She has no cervical adenopathy.  Neurological: She is alert and oriented to person, place, and time. She has normal reflexes. No cranial nerve deficit.  Patient has tremor in both hands and some rigidity.  Skin: Skin is warm and dry. No rash noted.  Psychiatric: She has a normal mood and affect. Her behavior is normal. Judgment and thought content normal.  The patient's mood affect and behavior are normal.  Nursing note and vitals reviewed.  BP (!) 141/76 (BP Location: Right Arm)   Pulse 77   Temp 98.3 F (36.8 C) (Oral)   Ht 5' 2" (1.575 m)   Wt 158 lb (71.7 kg)   BMI 28.90 kg/m         Assessment & Plan:  1. Other hyperlipidemia -Continue aggressive therapeutic lifestyle changes and Crestor pending results of lab work - CBC with Differential/Platelet -  Lipid panel - DG Chest 2 View; Future  2. Essential hypertension -Blood pressure is good today and on repeat it was better.  Continue with current treatment - BMP8+EGFR - CBC with Differential/Platelet - Hepatic function panel - DG Chest 2 View; Future  3. Malignant neoplasm of upper-outer quadrant of left female breast, unspecified estrogen receptor status ([place]) -Continue follow-up with surgeon and oncologist - CBC with Differential/Platelet  4. Parkinson disease ([place]) -Continue follow-up with neurology - CBC with Differential/Platelet  5. Vitamin D deficiency -Continue with vitamin D replacement pending results of lab work - CBC with Differential/Platelet - VITAMIN D 25 Hydroxy (Vit-D Deficiency, Fractures)  6. Senile purpura ([facility]) -No increased bruising noted today  7. Melanoma of skin ([place]) -Follow-up with dermatologist as planned  8. Excessive cerumen in ear canal, bilateral -Ear lavage to remove cerumen from both ears today.  9. 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  Thoracic kyphosis  Patient Instructions                       Medicare Annual Wellness Visit  [facility] and the medical providers at [facility] strive to bring you the best medical care.  In doing so we not only want to address your current medical conditions and concerns but also to detect new conditions early and prevent illness, disease and health-related problems.    Medicare offers a yearly Wellness Visit which allows our clinical staff to assess your need for preventative services including immunizations, lifestyle education, counseling to decrease risk of preventable diseases and screening for fall risk and other medical concerns.    This visit is provided free of charge (no copay) for all Medicare recipients. The clinical pharmacists at [facility] have begun to conduct these Wellness Visits which will also include a thorough review of all your medications.    As you primary medical provider recommend that you make an appointment for your Annual Wellness Visit if you have not done so already this year.  You may set up this appointment before you leave today or you may call back ([phone]) and schedule an appointment.  Please make sure when you call that you mention that you are scheduling your Annual Wellness Visit with the clinical pharmacist so that the appointment may be made for the proper length of time.     Continue current medications. Continue good therapeutic lifestyle changes which include good diet and exercise. Fall precautions discussed with patient. If an FOBT was given today- please return it to our front desk. If you are over 58 years old - you may need Prevnar 55 or the adult Pneumonia vaccine.  **Flu shots are available---  please call and schedule a FLU-CLINIC appointment**  After your visit with [place] today you will receive a survey in the mail or online from [employer] regarding your care with [place]. Please take a moment to fill this out. Your feedback is very important to [place] as you can help [place] better understand your patient needs as well as improve your experience and satisfaction. WE CARE ABOUT YOU!!!   Continue to follow-up with the neurologist, the dermatologist, the surgeon, and oncologist as planned.  Get mammograms regularly and check breast regularly. Continue  to be careful and do not put yourself at risk for falling and avoid climbing and move slowly. Pursue the avenue for deep brain stimulation and discuss any decisions about this with your neurologist before moving forward. Also, follow-up with the podiatrist regarding the toe abnormalities.  Arrie Senate MD

## 2018-01-11 NOTE — Patient Instructions (Addendum)
Medicare Annual Wellness Visit  [facility] and the medical providers at [facility] strive to bring you the best medical care.  In doing so we not only want to address your current medical conditions and concerns but also to detect new conditions early and prevent illness, disease and health-related problems.    Medicare offers a yearly Wellness Visit which allows our clinical staff to assess your need for preventative services including immunizations, lifestyle education, counseling to decrease risk of preventable diseases and screening for fall risk and other medical concerns.    This visit is provided free of charge (no copay) for all Medicare recipients. The clinical pharmacists at [facility] have begun to conduct these Wellness Visits which will also include a thorough review of all your medications.    As you primary medical provider recommend that you make an appointment for your Annual Wellness Visit if you have not done so already this year.  You may set up this appointment before you leave today or you may call back ([phone]) and schedule an appointment.  Please make sure when you call that you mention that you are scheduling your Annual Wellness Visit with the clinical pharmacist so that the appointment may be made for the proper length of time.     Continue current medications. Continue good therapeutic lifestyle changes which include good diet and exercise. Fall precautions discussed with patient. If an FOBT was given today- please return it to our front desk. If you are over 76 years old - you may need Prevnar 21 or the adult Pneumonia vaccine.  **Flu shots are available--- please call and schedule a FLU-CLINIC appointment**  After your visit with [place] today you will receive a survey in the mail or online from [employer] regarding your care with [place]. Please take a moment to fill this out. Your feedback is very  important to [place] as you can help [place] better understand your patient needs as well as improve your experience and satisfaction. WE CARE ABOUT YOU!!!   Continue to follow-up with the neurologist, the dermatologist, the surgeon, and oncologist as planned.  Get mammograms regularly and check breast regularly. Continue to be careful and do not put yourself at risk for falling and avoid climbing and move slowly. Pursue the avenue for deep brain stimulation and discuss any decisions about this with your neurologist before moving forward. Also, follow-up with the podiatrist regarding the toe abnormalities.

## 2018-01-12 ENCOUNTER — Telehealth: Payer: Self-pay | Admitting: Family Medicine

## 2018-01-12 DIAGNOSIS — L570 Actinic keratosis: Secondary | ICD-10-CM | POA: Diagnosis not present

## 2018-01-12 LAB — HEPATIC FUNCTION PANEL
ALT: 15 IU/L (ref 0–32)
AST: 21 IU/L (ref 0–40)
Albumin: 4.4 g/dL (ref 3.5–4.8)
Alkaline Phosphatase: 77 IU/L (ref 39–117)
BILIRUBIN, DIRECT: 0.13 mg/dL (ref 0.00–0.40)
Bilirubin Total: 0.4 mg/dL (ref 0.0–1.2)
Total Protein: 6.6 g/dL (ref 6.0–8.5)

## 2018-01-12 LAB — LIPID PANEL
Chol/HDL Ratio: 2.6 ratio (ref 0.0–4.4)
Cholesterol, Total: 166 mg/dL (ref 100–199)
HDL: 64 mg/dL (ref >39)
LDL Calculated: 85 mg/dL (ref 0–99)
Triglycerides: 86 mg/dL (ref 0–149)
VLDL CHOLESTEROL CAL: 17 mg/dL (ref 5–40)

## 2018-01-12 LAB — CBC WITH DIFFERENTIAL/PLATELET
BASOS: 1 %
Basophils Absolute: 0 10*3/uL (ref 0.0–0.2)
EOS (ABSOLUTE): 0.1 10*3/uL (ref 0.0–0.4)
EOS: 3 %
HEMATOCRIT: 40.1 % (ref 34.0–46.6)
Hemoglobin: 13.4 g/dL (ref 11.1–15.9)
IMMATURE GRANS (ABS): 0 10*3/uL (ref 0.0–0.1)
IMMATURE GRANULOCYTES: 0 %
LYMPHS: 23 %
Lymphocytes Absolute: 1.1 10*3/uL (ref 0.7–3.1)
MCH: 30.5 pg (ref 26.6–33.0)
MCHC: 33.4 g/dL (ref 31.5–35.7)
MCV: 91 fL (ref 79–97)
Monocytes Absolute: 0.6 10*3/uL (ref 0.1–0.9)
Monocytes: 12 %
NEUTROS PCT: 61 %
Neutrophils Absolute: 2.9 10*3/uL (ref 1.4–7.0)
PLATELETS: 235 10*3/uL (ref 150–450)
RBC: 4.4 x10E6/uL (ref 3.77–5.28)
RDW: 13.9 % (ref 12.3–15.4)
WBC: 4.7 10*3/uL (ref 3.4–10.8)

## 2018-01-12 LAB — BMP8+EGFR
BUN/Creatinine Ratio: 18 (ref 12–28)
BUN: 16 mg/dL (ref 8–27)
CALCIUM: 9.2 mg/dL (ref 8.7–10.3)
CO2: 25 mmol/L (ref 20–29)
Chloride: 104 mmol/L (ref 96–106)
Creatinine, Ser: 0.91 mg/dL (ref 0.57–1.00)
GFR calc Af Amer: 71 mL/min/{1.73_m2} (ref >59)
GFR, EST NON AFRICAN AMERICAN: 62 mL/min/{1.73_m2} (ref >59)
Glucose: 88 mg/dL (ref 65–99)
Potassium: 4.3 mmol/L (ref 3.5–5.2)
Sodium: 143 mmol/L (ref 134–144)

## 2018-01-12 LAB — VITAMIN D 25 HYDROXY (VIT D DEFICIENCY, FRACTURES): VIT D 25 HYDROXY: 45.8 ng/mL (ref 30.0–100.0)

## 2018-01-12 NOTE — Telephone Encounter (Signed)
I have her stay on the [place] daily for the next few days or through the weekend, and if she does not get better with the dizziness to call us back.  Continue to drink plenty of fluids and stay well-hydrated.

## 2018-01-12 NOTE — Telephone Encounter (Signed)
Pt aware.

## 2018-01-12 NOTE — Telephone Encounter (Signed)
Patient woke up this morning with dizziness and burning in both ears.  She took an Human resources officer to see if that would help.  She said after having her ears washed out yesterday she felt fine.  Please advise.

## 2018-01-12 NOTE — Telephone Encounter (Signed)
What symptoms do you have? Dizzy and ears are burning. Got ears washed out 6-3  How long have you been sick? This morning  Have you been seen for this problem? Yes  If your provider decides to give you a prescription, which pharmacy would you like for it to be sent to? [facility]   Patient informed that this information will be sent to the clinical staff for review and that they should receive a follow up call.

## 2018-01-17 ENCOUNTER — Other Ambulatory Visit: Payer: Self-pay | Admitting: Family Medicine

## 2018-01-17 DIAGNOSIS — J069 Acute upper respiratory infection, unspecified: Secondary | ICD-10-CM

## 2018-01-19 DIAGNOSIS — H04123 Dry eye syndrome of bilateral lacrimal glands: Secondary | ICD-10-CM | POA: Diagnosis not present

## 2018-01-27 ENCOUNTER — Telehealth: Payer: Self-pay

## 2018-01-27 NOTE — Telephone Encounter (Signed)
Called patient to confirm she is taking Anastrozole.  She states she is and gets it filled at [facility] in [place] (90 day supply).

## 2018-02-02 DIAGNOSIS — M79671 Pain in right foot: Secondary | ICD-10-CM | POA: Diagnosis not present

## 2018-02-02 DIAGNOSIS — M2041 Other hammer toe(s) (acquired), right foot: Secondary | ICD-10-CM | POA: Diagnosis not present

## 2018-02-16 DIAGNOSIS — H903 Sensorineural hearing loss, bilateral: Secondary | ICD-10-CM | POA: Diagnosis not present

## 2018-02-16 DIAGNOSIS — H9312 Tinnitus, left ear: Secondary | ICD-10-CM | POA: Diagnosis not present

## 2018-02-16 DIAGNOSIS — H8102 Meniere's disease, left ear: Secondary | ICD-10-CM | POA: Diagnosis not present

## 2018-02-16 DIAGNOSIS — H838X3 Other specified diseases of inner ear, bilateral: Secondary | ICD-10-CM | POA: Diagnosis not present

## 2018-02-16 DIAGNOSIS — R42 Dizziness and giddiness: Secondary | ICD-10-CM | POA: Diagnosis not present

## 2018-03-03 ENCOUNTER — Other Ambulatory Visit: Payer: Self-pay | Admitting: Hematology

## 2018-03-03 DIAGNOSIS — Z9889 Other specified postprocedural states: Secondary | ICD-10-CM

## 2018-03-12 ENCOUNTER — Telehealth: Payer: Self-pay | Admitting: Family Medicine

## 2018-03-30 DIAGNOSIS — H11433 Conjunctival hyperemia, bilateral: Secondary | ICD-10-CM | POA: Diagnosis not present

## 2018-03-30 DIAGNOSIS — H1045 Other chronic allergic conjunctivitis: Secondary | ICD-10-CM | POA: Diagnosis not present

## 2018-04-07 ENCOUNTER — Other Ambulatory Visit: Payer: Self-pay | Admitting: Family Medicine

## 2018-04-14 ENCOUNTER — Telehealth: Payer: Self-pay | Admitting: Family Medicine

## 2018-04-14 NOTE — Telephone Encounter (Signed)
PT is wanting to know if we have samples of MYRBETRIQ 25 MG TB24 tablet

## 2018-04-14 NOTE — Telephone Encounter (Signed)
Aware.  No samples available. 

## 2018-04-26 ENCOUNTER — Ambulatory Visit
Admission: RE | Admit: 2018-04-26 | Discharge: 2018-04-26 | Disposition: A | Discharge status: Home / Self Care | Payer: Medicare Other | Source: Ambulatory Visit | Attending: Hematology | Admitting: Hematology

## 2018-04-26 DIAGNOSIS — R922 Inconclusive mammogram: Secondary | ICD-10-CM | POA: Diagnosis not present

## 2018-04-26 DIAGNOSIS — Z9889 Other specified postprocedural states: Secondary | ICD-10-CM

## 2018-04-26 DIAGNOSIS — Z853 Personal history of malignant neoplasm of breast: Secondary | ICD-10-CM | POA: Diagnosis not present

## 2018-05-04 DIAGNOSIS — Z85828 Personal history of other malignant neoplasm of skin: Secondary | ICD-10-CM | POA: Diagnosis not present

## 2018-05-04 DIAGNOSIS — L821 Other seborrheic keratosis: Secondary | ICD-10-CM | POA: Diagnosis not present

## 2018-05-04 DIAGNOSIS — L570 Actinic keratosis: Secondary | ICD-10-CM | POA: Diagnosis not present

## 2018-05-04 NOTE — Progress Notes (Signed)
[place]  Telephone:(336) [phone] [phone]  Clinic Follow Up Note   Patient Care Team: Chipper Herb, MD as PCP - General (Family Medicine) Phineas Real, Belinda Block, MD as Consulting Physician (Gynecology) Erroll Luna, MD as Consulting Physician (General Surgery) Truitt Merle, MD as Consulting Physician (Hematology) Thea Silversmith, MD as Consulting Physician (Radiation Oncology) Mauro Kaufmann, RN as Registered Nurse Rockwell Germany, RN as Registered Nurse Jake Shark, Johny Blamer, NP as Nurse Practitioner (Nurse Practitioner) Ellwood Dense., MD as Referring Physician (Neurology) 05/06/2018  CHIEF COMPLAINTS:  Follow Up left breast cancer  Oncology History   Breast cancer of upper-outer quadrant of left female breast [facility])   Staging form: Breast, AJCC 7th Edition     Clinical stage from 03/19/2015: Stage IIA (T2, N0, M0) - Signed by Truitt Merle, MD on 03/27/2015     Pathologic stage from 04/27/2015: Stage IA (T1c, N0, cM0) - Signed by Enid Cutter, MD on 05/01/2015       Staging comments: Staged on final surgical specimen by Dr. Donato Heinz.          Breast cancer of upper-outer quadrant of left female breast ([place])   03/06/2015 Mammogram    Hypoechoic area in the left breast 1:00 position, 3 cm from the nipple, measuring 1.9 x 1.1 x 1.2 cm, corresponding to an area of architectural distortion on mammogram.    03/19/2015 Initial Biopsy    Left breast core needle biopsy showed invasive lobular carcinoma, grade 1-2, lobular carcinoma in situ. ER 100% positive, PR 50% positive, HER-2 negative, Ki-67 5%.    03/22/2015 Breast MRI    2.1 x 1.2 x 1.7 cm irregular enhancement within the upper outer left breast compatible with biopsy-proven malignancy. No other abnormalities.    03/22/2015 Clinical Stage    Stage IIA: T2 N0    04/25/2015 Definitive Surgery    Left breast lumpectomy / SLNB: invasive lobular carcinoma, grade 1, margins were negative. LCIS; 3 sentinel  lymph nodes were negative (0/3), HER2/neu repeated and negative    04/25/2015 Pathologic Stage    Stage IA: T1c N0    04/25/2015 Oncotype testing    Recurrence score 15, which predicts 10% 10 year risk of distant recurrence with tamoxifen alone.    06/06/2015 - 06/27/2015 Radiation Therapy    Adjuvant RT: Left breast 42.72 Gy over 16 fractions    08/12/2015 -  Anti-estrogen oral therapy    Anastrozole 1 mg daily.    09/20/2015 Survivorship    Survivorship care plan completed and mailed to patient in lieu of in person visit at pt request.    09/24/2016 Imaging    DEXA ASSESSMENT: The probability of a major osteoporotic fracture is 19.3% within the next ten years The probability of a hip fracture is 4.4% within the next ten years [facility] Radiology AP Spine L1-L3 09/24/2016 74[age]3 [age] Osteopenia -1.3 1.019 g/cm2 -12.2% * AP Spine L1-L3 11/30/2013 72.5 years Normal -0.2 1.161 g/cm2 6.0% * AP Spine L1-L3 01/29/2011 68[age]7 years Normal -0.7 1.095 g/cm2 -7.7% * AP Spine L1-L3 10/13/2005 64.4 years Normal 0.0 1.186 g/cm2 - DualFemur Neck Left 09/24/2016 74.4 years Osteopenia -2.0 0.762 g/cm2 -6.6% * DualFemur Neck Left 11/30/2013 72.5 years Osteopenia -1.6 0.816 g/cm2 2.5% DualFemur Neck Left 01/29/2011 68[age]7 years Osteopenia -1.7 0.796 g/cm2 -3.5% DualFemur Neck Left 10/13/2005 [age] Osteopenia -1.5 0.825 g/cm2 - The BMD measured at Femur Neck Left is 0.762 g/cm2 with a T-score of -2.0. This patient is considered osteopenic according to  World Health Organization [facility]) criteria.     04/15/2017 Imaging    DIAG  breast TOMO bilateral FINDINGS: The left lumpectomy site is stable. Scarring in the medial left breast is unchanged for many years with an adjacent dystrophic calcification. No other suspicious findings or changes.  Mammographic images were processed with CAD.  IMPRESSION: No mammographic evidence of malignancy.    04/26/2018 Mammogram    04/26/2018  Mammogram IMPRESSION: 1. No mammographic evidence of malignancy in either breast. 2. Stable left breast post lumpectomy changes     HISTORY OF PRESENTING ILLNESS:  Tracey Juarez 77 y.o. female is here because of recently diagnosed left breast cancer. She presents to our multidisciplinary clinic with her husband.  This was discovered by screening mammogram, she did not feel the lump. She denies any skin or nipple change, no constitutional symptoms. The moment mammogram and ultrasound revealed a 1.9 cm mass at 1:00 position of left breast, core needle biopsy showed invasive lobular carcinoma. ER/PR stripe positive, HER-2 negative. She underwent breast MI after biopsy, which showed a 2.1 cm irregular enhancement mass in the upper outer left breast.   She has Parkinson disesease which diagnosed 5 years ago, mild, very physically active. She has no family history of breast or ovary cancer. She has no children.  CURRENT THERAPY: anastrozole 1 mg once daily, satarted on 06/11/2016  INTERIM HISTORY Tracey Juarez returns for follow-up. I saw her 1 year ago and NP Lacie saw her 6 months ago. She had DEXA in 2018. She has also been following up with her family physicians and has been complaining of dizziness lately.  Today, she is here with her husband. She is tolerating Anastrozole well, but complains of a "back hump" but no back pain. She is taking calcium and Vitamin D.    MEDICAL HISTORY:  Past Medical History:  Diagnosis Date  . Atrophic vaginitis   . Breast cancer in female [facility])   . Breast cancer of upper-outer quadrant of left female breast ([place]) 03/20/2015  . Detrusor instability   . Elevated cholesterol   . GERD (gastroesophageal reflux disease)   . Hot flashes   . Humerus head fracture, left, with routine healing, subsequent encounter 2000  . Hypertension   . Melanoma ([place])   . Mixed basal-squamous cell carcinoma    skin  . Parkinson disease ([place])   . Stroke ([place])    on Plavix, no  deficits    SURGICAL HISTORY: Past Surgical History:  Procedure Laterality Date  . BREAST LUMPECTOMY Left 04/2015   radiation  . BREAST LUMPECTOMY WITH RADIOACTIVE SEED AND SENTINEL LYMPH NODE BIOPSY Left 04/25/2015   Procedure: LEFT BREAST PARTIAL MASTECTOMY WITH RADIOACTIVE SEED AND SENTINEL LYMPH NODE MAPPING;  Surgeon: Erroll Luna, MD;  Location: [place];  Service: General;  Laterality: Left;  . FOOT SURGERY  2004  . KNEE SURGERY  2007  . skin cancer excised    . TONSILLECTOMY     GYN HISTORY  Menarchal: 11 LMP: 51 Contraceptive: no  HRT: 10 years G0P0:  SOCIAL HISTORY: Social History   Social History  . Marital Status: Married    Spouse Name: N/A  . Number of Children: No   . Years of Education: N/A   Occupational History  . Retired    Social History Main Topics  . Smoking status: Never Smoker   . Smokeless tobacco: Never Used  . Alcohol Use: No  . Drug Use: No  . Sexual Activity: No  Other Topics Concern  . Not on file   Social History Narrative    FAMILY HISTORY: Family History  Problem Relation Age of Onset  . Hypertension Mother   . Heart attack Mother   . Heart attack Maternal Aunt   . Heart attack Maternal Uncle   . Breast cancer Neg Hx     ALLERGIES:  has No Known Allergies.  MEDICATIONS:  Current Outpatient Medications  Medication Sig Dispense Refill  . anastrozole (ARIMIDEX) 1 MG tablet Take 1 tablet (1 mg total) by mouth daily. 90 tablet 3  . AZILECT 1 MG TABS tablet Take 1 tablet by mouth daily.    . Cholecalciferol (VITAMIN D) 2000 UNITS CAPS Take 1 capsule by mouth daily.    . clopidogrel (PLAVIX) 75 MG tablet Take 75 mg by mouth daily.    . diphenhydrAMINE (BENADRYL) 25 mg capsule Take 25 mg by mouth every 6 (six) hours as needed (for parkinson's symptoms).     . fluticasone (FLONASE) 50 MCG/ACT nasal spray Place 2 sprays into both nostrils daily. 16 g 6  . lisinopril (PRINIVIL,ZESTRIL) 10 MG tablet TAKE 1  TABLET BY MOUTH ONCE DAILY 90 tablet 1  . Magnesium 250 MG TABS Take 1 tablet by mouth daily.    . meclizine (ANTIVERT) 25 MG tablet TAKE 1 TABLET BY MOUTH THREE TIMES DAILY AS NEEDED FOR DIZZINESS 30 tablet 0  . Multiple Vitamin (MULTIVITAMIN) capsule Take 1 capsule by mouth daily.      Marland Kitchen MYRBETRIQ 25 MG TB24 tablet TAKE 1 TABLET BY MOUTH ONCE DAILY 30 tablet 5  . pramipexole (MIRAPEX) 0.125 MG tablet Take 0.125 mg by mouth 3 (three) times daily.    . pramipexole (MIRAPEX) 0.25 MG tablet Take 1 tablet by mouth 4 (four) times daily.    . ranitidine (ZANTAC) 150 MG tablet Take 150 mg by mouth 2 (two) times daily as needed.     . Risedronate Sodium (ATELVIA) 35 MG TBEC Take 1 tablet (35 mg total) by mouth once a week. 4 tablet 4  . rosuvastatin (CRESTOR) 10 MG tablet TAKE 1 TABLET BY MOUTH ONCE DAILY 90 tablet 1   No current facility-administered medications for this visit.     REVIEW OF SYSTEMS:   Constitutional: Denies fevers, chills or abnormal night sweats Eyes: Denies blurriness of vision, double vision or watery eyes Ears, nose, mouth, throat, and face: Denies mucositis or sore throat Respiratory: Denies cough, dyspnea or wheezes Cardiovascular: Denies palpitation, chest discomfort or lower extremity swelling Gastrointestinal:  Denies nausea, heartburn or change in bowel habits Skin: Denies abnormal skin rashes Lymphatics: Denies new lymphadenopathy or easy bruising Neurological:Denies numbness, tingling or new weaknesses Musculoskeletal: (+) back hump Behavioral/Psych: Mood is stable, no new changes  All other systems were reviewed with the patient and are negative.  PHYSICAL EXAMINATION: ECOG PERFORMANCE STATUS: 2  Vitals:   05/06/18 1125  BP: (!) 143/78  Pulse: 79  Resp: 17  Temp: 98.5 F (36.9 C)  SpO2: 98%   Filed Weights   05/06/18 1125  Weight: 161 lb 4.8 oz (73.2 kg)   GENERAL:alert, no distress and comfortable SKIN: skin color, texture, turgor are normal, no  rashes or significant lesions EYES: normal, conjunctiva are pink and non-injected, sclera clear OROPHARYNX:no exudate, no erythema and lips, buccal mucosa, and tongue normal  NECK: supple, thyroid normal size, non-tender, without nodularity LYMPH:  no palpable lymphadenopathy in the cervical, axillary or inguinal LUNGS: clear to auscultation and percussion with normal breathing effort HEART:  regular rate & rhythm and no murmurs and no lower extremity edema ABDOMEN:abdomen soft, non-tender and normal bowel sounds Musculoskeletal:no cyanosis of digits and no clubbing  PSYCH: alert & oriented x 3 with fluent speech NEURO: no focal motor/sensory deficits Breasts: exam deferred today   LABORATORY DATA:  I have reviewed the data as listed Lab Results  Component Value Date   WBC 4.9 05/06/2018   HGB 13.1 05/06/2018   HCT 39.5 05/06/2018   MCV 91.7 05/06/2018   PLT 227 05/06/2018   Recent Labs    09/07/17 1026 01/11/18 1016 05/06/18 1057  NA 141 143 142  K 4.5 4.3 4.3  CL 101 104 107  CO2 _0 GLUCOSE 94 88 93  BUN _1 CREATININE 1.01* 0.91 0.82  CALCIUM 10.1 9.2 9.9  GFRNONAA 55* 62 >60  GFRAA 63 71 >60  PROT 6.6 6.6 7.0  ALBUMIN 4.3 4.4 4.1  AST _2 ALT _3 ALKPHOS 83 77 57  BILITOT 0.4 0.4 0.5  BILIDIR 0.13 0.13  --    PATHOLOGY REPORT: Diagnosis 04/25/2015 1. Breast, lumpectomy, left - INVASIVE LOBULAR CARCINOMA, SEE COMMENT. 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 - PREVIOUS BIOPSY SITE. - SEE TUMOR SYNOPTIC TEMPLATE BELOW. 2. Lymph node, sentinel, biopsy, left - ONE LYMPH NODE, NEGATIVE FOR TUMOR (0/1) SEE COMMENT. 3. Lymph node, sentinel, biopsy, left - ONE LYMPH NODE, NEGATIVE FOR TUMOR (0/1) SEE COMMENT. 4. Lymph node, sentinel, biopsy, left - ONE LYMPH NODE, NEGATIVE FOR TUMOR (0/1) SEE COMMENT. Specimen, including laterality and lymph node sampling (sentinel, non-sentinel): Left breat with  sentinel lymph node sampling. Procedure: Lumpectomy. Histologic type: Lobular. Grade: 1 of 3 Tubule formation: 3 Nuclear pleomorphism: 1 Mitotic:1 Tumor size (gross measurement): 1.8 cm Margins: Invasive, distance to closest margin: 0.5 cm (anterior) In-situ, distance to closest margin: N/A If margin positive, focally or broadly: N/A Lymphovascular invasion: Absent. Ductal carcinoma in situ: Absent. Grade: Extensive. Extensive intraductal component: N/A Lobular neoplasia: Present. Tumor focality: Unifocal. Treatment effect: None. If present, treatment effect in breast tissue, lymph nodes or both: N/A Extent of tumor: Skin: N/A Nipple: N/A Skeletal muscle: N/A Lymph nodes: Examined: 3 Sentinel 0 Non-sentinel 3 Total Lymph nodes with metastasis: 0 Isolated tumor cells (< 0.2 mm): N/A Micrometastasis: (> 0.2 mm and < 2.0 mm): N/A Macrometastasis: (> 2.0 mm): N/A Extracapsular extension: N/A Breast prognostic profile: Estrogen receptor: Not repeated, previous study demonstrated 100% positivity, [id number]. Progesterone receptor: Not repeated, previous study demonstrated 50% positivity, [id number]. Her 2 neu: Repeated, previous study demonstrated no amplification, [id number]. Ki-67: Not repeated, previous study demonstrated 5% proliferation rate. Non-neoplastic breast: Previous biopsy site, fibrocystic change, benign radial scar, calcifications. TNM: pT1c, pN0, pMX Comments: None. 2. - 4. There are no intranodal malignant epithelial tumor deposits identified on routine histology or with cytokeratin AE1/3 immunostains. (CRR:ecj 04/26/2015)  Results: HER2 - NEGATIVE RATIO OF HER2/CEP17 SIGNALS 1.69 AVERAGE HER2 COPY NUMBER PER CELL 2.45  Oncotype RS 15  RADIOGRAPHIC STUDIES: I have personally reviewed the radiological images as listed and agreed with the findings in the report.  04/26/2018 Mammogram IMPRESSION: 1. No mammographic evidence of malignancy in either  breast. 2. Stable left breast post lumpectomy changes.  04/15/2017 Mammogram IMPRESSION: No mammographic evidence of malignancy.  09/24/2016 DEXA DENSITOMETRY RESULTS: Site Region Meas'd Date Meas'd Age WHO Class. YA T-score BMD %Chg Prev. Sig. Chg (*) AP Spine L1-L3 09/24/2016 74.3 years Osteopenia -1.3 1.019 g/cm2 -  12.2% * AP Spine L1-L3 11/30/2013 71.5 years Normal -0.2 1.161 g/cm2 6.0% * AP Spine L1-L3 01/29/2011 68.7 years Normal -0.7 1.095 g/cm2 -7.7% * AP Spine L1-L3 10/13/2005 63.4 years Normal 0.0 1.186 g/cm2 - DualFemur Neck Left 09/24/2016 74.3 years Osteopenia -2.0 0.762 g/cm2 -6.6% * DualFemur Neck Left 11/30/2013 71.5 years Osteopenia -1.6 0.816 g/cm2 2.5% DualFemur Neck Left 01/29/2011 68.7 years Osteopenia -1.7 0.796 g/cm2 -3.5% DualFemur Neck Left 10/13/2005 63.4 years Osteopenia -1.5 0.825 g/cm2 - The BMD measured at Femur Neck Left is 0.762 g/cm2 with a T-score of -2.0. This patient is considered osteopenic according to [facility]) criteria. Compared with the prior study on 11/30/2013 ,the BMD of the total hip/femoral neck shows a statistically significant decrease.L-4 was not utilized due to degenerative changes .  MM Diag breast tomo bilateral 03/25/2016 IMPRESSION: No evidence of recurrent or new breast malignancy. Benign postsurgical changes on the left.  RECOMMENDATION: Diagnostic mammography in 1 year per standard post lumpectomy protocol.  ASSESSMENT & PLAN:  77 y.o. Caucasian female, postmenopausal, presented with screening detected left breast cancer.  1. Left breast invasive lobular carcinoma, pT1cN0M0, stage IA, ER/PR positive, HER-2 negative, Ki-67 5%, Oncotype low risk -I previously reviewed her surgical pathology findings with her and her husband in great detail. -She had a complete surgical resection, margins were negative, sentinel lymph nodes were negative, she has low-grade stage I breast cancer, likely cured by  surgery alone. -We previously discussed the small risk of cancer recurrence after her surgery. I reviewed her Oncotype DX test results, the recurrence score is 15, which predicts 10% 10 year distant recurrence with tamoxifen alone. -she is currently on adjuvant anastrozole, has developed moderate hot flash and leg cramps, improved lately, will continue anastrozole for now, plan for total 5 years. -we'll continue breast cancer surveillance with annual mammogram, self-exam, and routine follow-up with [place].  -She is clinically doing well, last mammogram on 04/26/2018 was negative, lab and physical exam are unremarkable today, no evidence of recurrence. We'll continue surveillance. -Labs reviewed CBC and CMP WNLs -F/u in 12 months. Pt will f/u with PCP in the interim    2. Osteopenia -She had a bone density scan done in April 2015, which showed osteopenia with T score of -1.9 -I encouraged her to continue vitamin D and calcium  -We previously discussed that the national may potentially make her osteopenia worse.  -she will repeat her bone density scan every 2 years at her PCP office.  3. Parkinson disease, hypertension -She'll continue follow-up with her primary care physician -Stable  4. Hot flash  -Secondary to anastrozole, much better lately, manageable. -I recommended SSRI for her hot flash, OK with her neurologist, but she declined  Plan  -continue anastrozole -f/u in 12 months, f/u with PCP in the Interim   All questions were answered. The patient knows to call the clinic with any problems, questions or concerns.  I spent 15 minutes counseling the patient face to face. The total time spent in the appointment was 20 minutes and more than 50% was on counseling.  Dierdre Searles Dweik am acting as scribe for Dr. Truitt Merle.  I have reviewed the above documentation for accuracy and completeness, and I agree with the above.    Truitt Merle  05/06/2018

## 2018-05-06 ENCOUNTER — Encounter: Payer: Self-pay | Admitting: Hematology

## 2018-05-06 ENCOUNTER — Inpatient Hospital Stay: Payer: Medicare Other

## 2018-05-06 ENCOUNTER — Inpatient Hospital Stay: Payer: Medicare Other | Attending: Hematology | Admitting: Hematology

## 2018-05-06 VITALS — BP 143/78 | HR 79 | Temp 98.5°F | Resp 17 | Ht 62.0 in | Wt 161.3 lb

## 2018-05-06 DIAGNOSIS — Z79811 Long term (current) use of aromatase inhibitors: Secondary | ICD-10-CM | POA: Diagnosis not present

## 2018-05-06 DIAGNOSIS — Z9012 Acquired absence of left breast and nipple: Secondary | ICD-10-CM | POA: Diagnosis not present

## 2018-05-06 DIAGNOSIS — M85852 Other specified disorders of bone density and structure, left thigh: Secondary | ICD-10-CM

## 2018-05-06 DIAGNOSIS — I10 Essential (primary) hypertension: Secondary | ICD-10-CM | POA: Insufficient documentation

## 2018-05-06 DIAGNOSIS — G20 Parkinson's disease: Secondary | ICD-10-CM | POA: Diagnosis not present

## 2018-05-06 DIAGNOSIS — Z923 Personal history of irradiation: Secondary | ICD-10-CM | POA: Diagnosis not present

## 2018-05-06 DIAGNOSIS — Z170 Estrogen receptor positive status [ER+]: Secondary | ICD-10-CM | POA: Diagnosis not present

## 2018-05-06 DIAGNOSIS — M8588 Other specified disorders of bone density and structure, other site: Secondary | ICD-10-CM | POA: Diagnosis not present

## 2018-05-06 DIAGNOSIS — C50412 Malignant neoplasm of upper-outer quadrant of left female breast: Secondary | ICD-10-CM

## 2018-05-06 LAB — CBC WITH DIFFERENTIAL/PLATELET
BASOS ABS: 0.1 10*3/uL (ref 0.0–0.1)
BASOS PCT: 1 %
EOS ABS: 0.1 10*3/uL (ref 0.0–0.5)
EOS PCT: 3 %
HEMATOCRIT: 39.5 % (ref 34.8–46.6)
Hemoglobin: 13.1 g/dL (ref 11.6–15.9)
Lymphocytes Relative: 27 %
Lymphs Abs: 1.3 10*3/uL (ref 0.9–3.3)
MCH: 30.3 pg (ref 25.1–34.0)
MCHC: 33 g/dL (ref 31.5–36.0)
MCV: 91.7 fL (ref 79.5–101.0)
MONO ABS: 0.6 10*3/uL (ref 0.1–0.9)
MONOS PCT: 13 %
NEUTROS ABS: 2.7 10*3/uL (ref 1.5–6.5)
Neutrophils Relative %: 56 %
PLATELETS: 227 10*3/uL (ref 145–400)
RBC: 4.31 MIL/uL (ref 3.70–5.45)
RDW: 13.7 % (ref 11.2–14.5)
WBC: 4.9 10*3/uL (ref 3.9–10.3)

## 2018-05-06 LAB — COMPREHENSIVE METABOLIC PANEL
ALT: 19 U/L (ref 0–44)
ANION GAP: 8 (ref 5–15)
AST: 20 U/L (ref 15–41)
Albumin: 4.1 g/dL (ref 3.5–5.0)
Alkaline Phosphatase: 57 U/L (ref 38–126)
BILIRUBIN TOTAL: 0.5 mg/dL (ref 0.3–1.2)
BUN: 18 mg/dL (ref 8–23)
CHLORIDE: 107 mmol/L (ref 98–111)
CO2: 27 mmol/L (ref 22–32)
Calcium: 9.9 mg/dL (ref 8.9–10.3)
Creatinine, Ser: 0.82 mg/dL (ref 0.44–1.00)
GFR calc Af Amer: >60 mL/min (ref >60)
GFR calc non Af Amer: >60 mL/min (ref >60)
GLUCOSE: 93 mg/dL (ref 70–99)
POTASSIUM: 4.3 mmol/L (ref 3.5–5.1)
Sodium: 142 mmol/L (ref 135–145)
TOTAL PROTEIN: 7 g/dL (ref 6.5–8.1)

## 2018-05-06 MED ORDER — ANASTROZOLE 1 MG PO TABS: 1.0000 mg | ORAL_TABLET | Freq: Every day | ORAL | 3 refills | Status: DC

## 2018-05-07 ENCOUNTER — Telehealth: Payer: Self-pay | Admitting: Hematology

## 2018-05-07 NOTE — Telephone Encounter (Signed)
Appts scheduled letter/calendar mailed per 9/26 los °

## 2018-05-18 ENCOUNTER — Other Ambulatory Visit: Payer: Self-pay | Admitting: Family Medicine

## 2018-05-19 ENCOUNTER — Other Ambulatory Visit: Payer: Self-pay | Admitting: Family Medicine

## 2018-05-26 ENCOUNTER — Encounter: Payer: Self-pay | Admitting: Family Medicine

## 2018-05-26 ENCOUNTER — Ambulatory Visit (INDEPENDENT_AMBULATORY_CARE_PROVIDER_SITE_OTHER): Payer: Medicare Other | Admitting: Family Medicine

## 2018-05-26 VITALS — BP 138/78 | HR 79 | Temp 98.3°F | Ht 62.0 in | Wt 163.0 lb

## 2018-05-26 DIAGNOSIS — Z23 Encounter for immunization: Secondary | ICD-10-CM

## 2018-05-26 DIAGNOSIS — G20 Parkinson's disease: Secondary | ICD-10-CM | POA: Diagnosis not present

## 2018-05-26 DIAGNOSIS — M25521 Pain in right elbow: Secondary | ICD-10-CM

## 2018-05-26 DIAGNOSIS — C50412 Malignant neoplasm of upper-outer quadrant of left female breast: Secondary | ICD-10-CM

## 2018-05-26 DIAGNOSIS — M1711 Unilateral primary osteoarthritis, right knee: Secondary | ICD-10-CM | POA: Diagnosis not present

## 2018-05-26 DIAGNOSIS — E559 Vitamin D deficiency, unspecified: Secondary | ICD-10-CM

## 2018-05-26 DIAGNOSIS — I10 Essential (primary) hypertension: Secondary | ICD-10-CM

## 2018-05-26 DIAGNOSIS — M25561 Pain in right knee: Secondary | ICD-10-CM | POA: Diagnosis not present

## 2018-05-26 DIAGNOSIS — E7849 Other hyperlipidemia: Secondary | ICD-10-CM

## 2018-05-26 NOTE — Progress Notes (Signed)
Subjective:    Patient ID: Tracey Juarez, female    DOB: Sep 02, 1940, 77 y.o.   MRN: [id number]  HPI Pt here for follow up and management of chronic medical problems which includes hypertension and hyperlipidemia. She is taking medication regularly.  Patient today is complaining with some right arm and right knee arthritic symptoms and arthralgias.  She is also currently being followed for her Parkinson's with the neurologist.  Recheck of the blood pressure was higher than the initial blood pressure.  She does take lisinopril.  She is also being followed for a malignant neoplasm of the breast.  The breast cancer was in the left breast and she is also had melanoma in the past.  She has GERD in addition to her hypertension and hyperlipidemia.  She has had a stroke with no deficits.  She is currently on lisinopril 10 mg.  Today denies any chest pain or shortness of breath anymore than usual.  She is active physically but not going for exercise therapy.  She does have a history of reflux.  She is noticed that taking Mirapex that this aggravates her reflux and gives her more heartburn.  She was taking Zantac but has stopped this and is not taking anything else currently.  She denies any blood in the stool black tarry bowel movements or change in bowel habits or abdominal pain.  She takes Myrbetriq for overactive bladder and urgency symptoms.  She has stopped taking her Zantac and we have recommended that she change to Pepcid AC.  She complains of some right elbow to wrist pain that has been going on for a couple of months and she is concerned that she could have injured this when exercising.  She does recall a specific incident and has not gone back for exercising since that time.  She has had some right knee pain or arthritis more recently.  She sees the oncologist and breast surgeon regularly and has had her mammograms done and they are up-to-date.  She sees the neurologist, Dr. Bjorn Loser in [place]  regularly.     Patient Active Problem List   Diagnosis Date Noted  . 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 Senile purpura ([place]) 03/04/2017  . Pure hypercholesterolemia 03/04/2017  . Breast cancer of upper-outer quadrant of left female breast ([place]) 03/20/2015  . Osteopenia 08/15/2013  . Parkinson disease ([place])   . Hypertension   . Atrophic vaginitis   . Detrusor instability   . Melanoma ([place])   . Mixed basal-squamous cell carcinoma    Outpatient Encounter Medications as of 05/26/2018  Medication Sig  . anastrozole (ARIMIDEX) 1 MG tablet Take 1 tablet (1 mg total) by mouth daily.  . AZILECT 1 MG TABS tablet Take 1 tablet by mouth daily.  . Cholecalciferol (VITAMIN D) 2000 UNITS CAPS Take 1 capsule by mouth daily.  . clopidogrel (PLAVIX) 75 MG tablet Take 75 mg by mouth daily.  . diphenhydrAMINE (BENADRYL) 25 mg capsule Take 25 mg by mouth every 6 (six) hours as needed (for parkinson's symptoms).   . fluticasone (FLONASE) 50 MCG/ACT nasal spray Place 2 sprays into both nostrils daily.  Marland Kitchen lisinopril (PRINIVIL,ZESTRIL) 10 MG tablet TAKE 1 TABLET BY MOUTH ONCE DAILY  . Magnesium 250 MG TABS Take 1 tablet by mouth daily.  . meclizine (ANTIVERT) 25 MG tablet TAKE 1 TABLET BY MOUTH THREE TIMES DAILY AS NEEDED FOR DIZZINESS  . Multiple Vitamin (MULTIVITAMIN) capsule Take 1 capsule by mouth  daily.    Marland Kitchen MYRBETRIQ 25 MG TB24 tablet TAKE 1 TABLET BY MOUTH ONCE DAILY  . pramipexole (MIRAPEX) 0.25 MG tablet Take 1 tablet by mouth 4 (four) times daily.  . Risedronate Sodium 35 MG TBEC TAKE 1 TABLET BY MOUTH ONCE A WEEK  . rosuvastatin (CRESTOR) 10 MG tablet TAKE 1 TABLET BY MOUTH ONCE DAILY  . [DISCONTINUED] pramipexole (MIRAPEX) 0.125 MG tablet Take 0.125 mg by mouth 3 (three) times daily.  . [DISCONTINUED] ranitidine (ZANTAC) 150 MG tablet Take 150 mg by mouth 2 (two) times daily as needed.    No facility-administered encounter  medications on file as of 05/26/2018.      Review of Systems  Constitutional: Negative.   HENT: Negative.   Eyes: Negative.   Respiratory: Negative.   Cardiovascular: Negative.   Gastrointestinal: Negative.   Endocrine: Negative.   Genitourinary: Negative.   Musculoskeletal: Positive for arthralgias (right arm and right knee pain ).  Skin: Negative.   Allergic/Immunologic: Negative.   Neurological: Negative.   Hematological: Negative.   Psychiatric/Behavioral: Negative.        Objective:   Physical Exam  Constitutional: She is oriented to person, place, and time. She appears well-developed and well-nourished. No distress.  The patient is pleasant calm and alert.  HENT:  Head: Normocephalic and atraumatic.  Right Ear: External ear normal.  Left Ear: External ear normal.  Nose: Nose normal.  Mouth/Throat: Oropharynx is clear and moist. No oropharyngeal exudate.  Eyes: Pupils are equal, round, and reactive to light. Conjunctivae and EOM are normal. Right eye exhibits no discharge. Left eye exhibits no discharge. No scleral icterus.  She has regular eye exams.  Neck: Normal range of motion. Neck supple. No thyromegaly present.  Bruits thyromegaly or anterior cervical adenopathy are not present  Cardiovascular: Normal rate, regular rhythm, normal heart sounds and intact distal pulses.  No murmur heard. Heart has a regular rate and rhythm at 72/min  Pulmonary/Chest: Effort normal and breath sounds normal. She has no wheezes. She has no rales.  Clear anteriorly and posteriorly  Abdominal: Soft. Bowel sounds are normal. She exhibits no mass. There is no tenderness. There is no rebound and no guarding.  No epigastric tenderness liver or spleen enlargement masses bruits or inguinal adenopathy  Musculoskeletal: Normal range of motion. She exhibits tenderness. She exhibits no edema or deformity.  Thoracic kyphosis.  Tender right lateral epicondyle.  Tender right knee lateral joint line  with pressure  Lymphadenopathy:    She has no cervical adenopathy.  Neurological: She is alert and oriented to person, place, and time. She has normal reflexes. No cranial nerve deficit.  Reflexes are 3+ and equal bilaterally.  The tremor seems to be more prominent in the right hand.  Skin: Skin is warm and dry. No rash noted.  Psychiatric: She has a normal mood and affect. Her behavior is normal. Judgment and thought content normal.  Nursing note and vitals reviewed.  BP (!) 148/83 (BP Location: Right Arm)   Pulse 79   Temp 98.3 F (36.8 C) (Oral)   Ht '5\' 2"'  (1.575 m)   Wt 163 lb (73.9 kg)   BMI 29.81 kg/m         Assessment & Plan:  1. Essential hypertension -The blood pressure was up on 2 occasions today but on the third occasion was less than 140 and 78. - CBC with Differential/Platelet - BMP8+EGFR - Hepatic function panel - DG Chest 2 View; Future  2. Other hyperlipidemia -Continue  with current treatment pending results of lab work - CBC with Differential/Platelet - Lipid panel - DG Chest 2 View; Future  3. Vitamin D deficiency -Continue with vitamin D replacement pending results of lab work - CBC with Differential/Platelet - VITAMIN D 25 Hydroxy (Vit-D Deficiency, Fractures)  4. Parkinson disease ([place]) -Continue to follow-up with Dr. Bjorn Loser - CBC with Differential/Platelet  5. Right elbow pain -Wear tennis elbow brace as directed and use warm wet compresses to the right elbow 20 minutes 2 or 3 times daily. - Ambulatory referral to Orthopedic Surgery  6. Right knee pain, unspecified chronicity -Warm wet compresses 20 minutes 2 or 3 times daily and continue with arthritis strength Tylenol because of GI sensitivity to NSAIDs - Ambulatory referral to Orthopedic Surgery  7. Primary osteoarthritis of right knee -Appointment with orthopedics and take arthritis Tylenol  8. Malignant neoplasm of upper-outer quadrant of left female breast, unspecified estrogen  receptor status ([place]) -Continue to follow-up regularly with oncology and surgery  9. Encounter for immunization - Flu vaccine HIGH DOSE PF  Patient Instructions                       Medicare Annual Wellness Visit  [facility] and the medical providers at [facility] strive to bring you the best medical care.  In doing so we not only want to address your current medical conditions and concerns but also to detect new conditions early and prevent illness, disease and health-related problems.    Medicare offers a yearly Wellness Visit which allows our clinical staff to assess your need for preventative services including immunizations, lifestyle education, counseling to decrease risk of preventable diseases and screening for fall risk and other medical concerns.    This visit is provided free of charge (no copay) for all Medicare recipients. The clinical pharmacists at [facility] have begun to conduct these Wellness Visits which will also include a thorough review of all your medications.    As you primary medical provider recommend that you make an appointment for your Annual Wellness Visit if you have not done so already this year.  You may set up this appointment before you leave today or you may call back ([phone]) and schedule an appointment.  Please make sure when you call that you mention that you are scheduling your Annual Wellness Visit with the clinical pharmacist so that the appointment may be made for the proper length of time.     Continue current medications. Continue good therapeutic lifestyle changes which include good diet and exercise. Fall precautions discussed with patient. If an FOBT was given today- please return it to our front desk. If you are over 101 years old - you may need Prevnar 66 or the adult Pneumonia vaccine.  **Flu shots are available--- please call and schedule a FLU-CLINIC appointment**  After your visit with  [place] today you will receive a survey in the mail or online from [employer] regarding your care with [place]. Please take a moment to fill this out. Your feedback is very important to [place] as you can help [place] better understand your patient needs as well as improve your experience and satisfaction. WE CARE ABOUT YOU!!!   Continue to follow-up with breast surgeon and oncologist neurologist regularly. Try to exercise and walk regularly. He can try taking Pepcid AC before breakfast and supper for 1 month to see if this helps your reflux symptoms, and after 1 month just  take it as needed We will schedule you for an appointment with orthopedic surgeon that comes here in November and if your elbow and knee are better you can cancel that appointment In the meantime take extra strength Tylenol or arthritis strength Tylenol and use warm wet compresses to both the elbow and the right knee for 20 minutes at least a couple times daily Do not forget the right elbow brace and placed this on and use as discussed during the visit This winter drink plenty of fluids and stay well-hydrated  Arrie Senate MD

## 2018-05-26 NOTE — Patient Instructions (Addendum)
Medicare Annual Wellness Visit  [facility] and the medical providers at [facility] strive to bring you the best medical care.  In doing so we not only want to address your current medical conditions and concerns but also to detect new conditions early and prevent illness, disease and health-related problems.    Medicare offers a yearly Wellness Visit which allows our clinical staff to assess your need for preventative services including immunizations, lifestyle education, counseling to decrease risk of preventable diseases and screening for fall risk and other medical concerns.    This visit is provided free of charge (no copay) for all Medicare recipients. The clinical pharmacists at [facility] have begun to conduct these Wellness Visits which will also include a thorough review of all your medications.    As you primary medical provider recommend that you make an appointment for your Annual Wellness Visit if you have not done so already this year.  You may set up this appointment before you leave today or you may call back ([phone]) and schedule an appointment.  Please make sure when you call that you mention that you are scheduling your Annual Wellness Visit with the clinical pharmacist so that the appointment may be made for the proper length of time.     Continue current medications. Continue good therapeutic lifestyle changes which include good diet and exercise. Fall precautions discussed with patient. If an FOBT was given today- please return it to our front desk. If you are over 41 years old - you may need Prevnar 45 or the adult Pneumonia vaccine.  **Flu shots are available--- please call and schedule a FLU-CLINIC appointment**  After your visit with [place] today you will receive a survey in the mail or online from [employer] regarding your care with [place]. Please take a moment to fill this out. Your feedback is very  important to [place] as you can help [place] better understand your patient needs as well as improve your experience and satisfaction. WE CARE ABOUT YOU!!!   Continue to follow-up with breast surgeon and oncologist neurologist regularly. Try to exercise and walk regularly. He can try taking Pepcid AC before breakfast and supper for 1 month to see if this helps your reflux symptoms, and after 1 month just take it as needed We will schedule you for an appointment with orthopedic surgeon that comes here in November and if your elbow and knee are better you can cancel that appointment In the meantime take extra strength Tylenol or arthritis strength Tylenol and use warm wet compresses to both the elbow and the right knee for 20 minutes at least a couple times daily Do not forget the right elbow brace and placed this on and use as discussed during the visit This winter drink plenty of fluids and stay well-hydrated

## 2018-05-27 LAB — CBC WITH DIFFERENTIAL/PLATELET
BASOS: 1 %
Basophils Absolute: 0 10*3/uL (ref 0.0–0.2)
EOS (ABSOLUTE): 0.2 10*3/uL (ref 0.0–0.4)
EOS: 4 %
HEMATOCRIT: 39.4 % (ref 34.0–46.6)
Hemoglobin: 13.2 g/dL (ref 11.1–15.9)
IMMATURE GRANS (ABS): 0 10*3/uL (ref 0.0–0.1)
IMMATURE GRANULOCYTES: 0 %
Lymphocytes Absolute: 1.2 10*3/uL (ref 0.7–3.1)
Lymphs: 22 %
MCH: 30.6 pg (ref 26.6–33.0)
MCHC: 33.5 g/dL (ref 31.5–35.7)
MCV: 91 fL (ref 79–97)
MONOS ABS: 0.7 10*3/uL (ref 0.1–0.9)
Monocytes: 13 %
NEUTROS ABS: 3.2 10*3/uL (ref 1.4–7.0)
NEUTROS PCT: 60 %
Platelets: 247 10*3/uL (ref 150–450)
RBC: 4.31 x10E6/uL (ref 3.77–5.28)
RDW: 12.8 % (ref 12.3–15.4)
WBC: 5.3 10*3/uL (ref 3.4–10.8)

## 2018-05-27 LAB — LIPID PANEL
CHOL/HDL RATIO: 2.8 ratio (ref 0.0–4.4)
Cholesterol, Total: 173 mg/dL (ref 100–199)
HDL: 62 mg/dL (ref >39)
LDL Calculated: 92 mg/dL (ref 0–99)
Triglycerides: 93 mg/dL (ref 0–149)
VLDL CHOLESTEROL CAL: 19 mg/dL (ref 5–40)

## 2018-05-27 LAB — HEPATIC FUNCTION PANEL
ALK PHOS: 59 IU/L (ref 39–117)
ALT: 15 IU/L (ref 0–32)
AST: 20 IU/L (ref 0–40)
Albumin: 4.4 g/dL (ref 3.5–4.8)
BILIRUBIN TOTAL: 0.4 mg/dL (ref 0.0–1.2)
BILIRUBIN, DIRECT: 0.12 mg/dL (ref 0.00–0.40)
Total Protein: 6.9 g/dL (ref 6.0–8.5)

## 2018-05-27 LAB — BMP8+EGFR
BUN/Creatinine Ratio: 19 (ref 12–28)
BUN: 17 mg/dL (ref 8–27)
CALCIUM: 9.1 mg/dL (ref 8.7–10.3)
CO2: 25 mmol/L (ref 20–29)
Chloride: 105 mmol/L (ref 96–106)
Creatinine, Ser: 0.9 mg/dL (ref 0.57–1.00)
GFR calc Af Amer: 72 mL/min/{1.73_m2} (ref >59)
GFR, EST NON AFRICAN AMERICAN: 62 mL/min/{1.73_m2} (ref >59)
Glucose: 92 mg/dL (ref 65–99)
Potassium: 4.4 mmol/L (ref 3.5–5.2)
Sodium: 143 mmol/L (ref 134–144)

## 2018-05-27 LAB — VITAMIN D 25 HYDROXY (VIT D DEFICIENCY, FRACTURES): VIT D 25 HYDROXY: 55.9 ng/mL (ref 30.0–100.0)

## 2018-06-07 ENCOUNTER — Other Ambulatory Visit: Payer: Self-pay | Admitting: Family Medicine

## 2018-06-10 DIAGNOSIS — M25531 Pain in right wrist: Secondary | ICD-10-CM | POA: Diagnosis not present

## 2018-06-10 DIAGNOSIS — M25561 Pain in right knee: Secondary | ICD-10-CM | POA: Diagnosis not present

## 2018-06-30 DIAGNOSIS — G20 Parkinson's disease: Secondary | ICD-10-CM | POA: Diagnosis not present

## 2018-07-11 ENCOUNTER — Other Ambulatory Visit: Payer: Self-pay | Admitting: Family Medicine

## 2018-07-20 DIAGNOSIS — M76811 Anterior tibial syndrome, right leg: Secondary | ICD-10-CM | POA: Diagnosis not present

## 2018-07-20 DIAGNOSIS — M79671 Pain in right foot: Secondary | ICD-10-CM | POA: Diagnosis not present

## 2018-08-27 ENCOUNTER — Encounter: Payer: Self-pay | Admitting: Family Medicine

## 2018-08-27 ENCOUNTER — Ambulatory Visit (INDEPENDENT_AMBULATORY_CARE_PROVIDER_SITE_OTHER): Payer: Medicare Other | Admitting: Family Medicine

## 2018-08-27 VITALS — BP 156/77 | HR 50 | Temp 98.9°F | Ht 62.0 in | Wt 164.0 lb

## 2018-08-27 DIAGNOSIS — M5431 Sciatica, right side: Secondary | ICD-10-CM

## 2018-08-27 MED ORDER — METHYLPREDNISOLONE ACETATE 80 MG/ML IJ SUSP
80.0000 mg | Freq: Once | INTRAMUSCULAR | Status: AC
  Administered 2018-08-27: 80 mg via INTRAMUSCULAR

## 2018-08-27 NOTE — Patient Instructions (Signed)
Sciatica    Sciatica is pain, numbness, weakness, or tingling along your sciatic nerve. The sciatic nerve starts in the lower back and goes down the back of each leg. Sciatica happens when this nerve is pinched or has pressure put on it. Sciatica usually goes away on its own or with treatment. Sometimes, sciatica may keep coming back (recur).  Follow these instructions at home:  Medicines  · Take over-the-counter and prescription medicines only as told by your doctor.  · Do not drive or use heavy machinery while taking prescription pain medicine.  Managing pain  · If directed, put ice on the affected area.  ? Put ice in a plastic bag.  ? Place a towel between your skin and the bag.  ? Leave the ice on for 20 minutes, 2-3 times a day.  · After icing, apply heat to the affected area before you exercise or as often as told by your doctor. Use the heat source that your doctor tells you to use, such as a moist heat pack or a heating pad.  ? Place a towel between your skin and the heat source.  ? Leave the heat on for 20-30 minutes.  ? Remove the heat if your skin turns bright red. This is especially important if you are unable to feel pain, heat, or cold. You may have a greater risk of getting burned.  Activity  · Return to your normal activities as told by your doctor. Ask your doctor what activities are safe for you.  ? Avoid activities that make your sciatica worse.  · Take short rests during the day. Rest in a lying or standing position. This is usually better than sitting to rest.  ? When you rest for a long time, do some physical activity or stretching between periods of rest.  ? Avoid sitting for a long time without moving. Get up and move around at least one time each hour.  · Exercise and stretch regularly, as told by your doctor.  · Do not lift anything that is heavier than 10 lb (4.5 kg) while you have symptoms of sciatica.  ? Avoid lifting heavy things even when you do not have symptoms.  ? Avoid lifting  heavy things over and over.  · When you lift objects, always lift in a way that is safe for your body. To do this, you should:  ? Bend your knees.  ? Keep the object close to your body.  ? Avoid twisting.  General instructions  · Use good posture.  ? Avoid leaning forward when you are sitting.  ? Avoid hunching over when you are standing.  · Stay at a healthy weight.  · Wear comfortable shoes that support your feet. Avoid wearing high heels.  · Avoid sleeping on a mattress that is too soft or too hard. You might have less pain if you sleep on a mattress that is firm enough to support your back.  · Keep all follow-up visits as told by your doctor. This is important.  Contact a doctor if:  · You have pain that:  ? Wakes you up when you are sleeping.  ? Gets worse when you lie down.  ? Is worse than the pain you have had in the past.  ? Lasts longer than 4 weeks.  · You lose weight for without trying.  Get help right away if:  · You cannot control when you pee (urinate) or poop (have a bowel movement).  · You   have weakness in any of these areas and it gets worse.  ? Lower back.  ? Lower belly (pelvis).  ? Butt (buttocks).  ? Legs.  · You have redness or swelling of your back.  · You have a burning feeling when you pee.  This information is not intended to replace advice given to you by your health care provider. Make sure you discuss any questions you have with your health care provider.  Document Released: 05/06/2008 Document Revised: 01/03/2016 Document Reviewed: 04/06/2015  Elsevier Interactive Patient Education © 2019 [employer]

## 2018-08-27 NOTE — Progress Notes (Signed)
Subjective:    Patient ID: Tracey Juarez, female    DOB: 1940/11/11, 78 y.o.   MRN: [id number]  Chief Complaint:  Muscular pain in right groin area (thinks she may have hurt herself when she "jerked" a boot onto her foot)   HPI: Tracey Juarez is a 78 y.o. female presenting on 08/27/2018 for Muscular pain in right groin area (thinks she may have hurt herself when she "jerked" a boot onto her foot)   1. Sciatica of right side   Pt presents today with right hip pain. Pt states this started after pulling a boot onto her foot. States the pain starts in her buttock and radiates into her groin and anterior thigh. She denies weakness or loss of function. No fever, chills, or focal deficits. No known injury.    Relevant past medical, surgical, family, and social history reviewed and updated as indicated.  Allergies and medications reviewed and updated.   Past Medical History:  Diagnosis Date  . Atrophic vaginitis   . Breast cancer in female [facility])   . Breast cancer of upper-outer quadrant of left female breast ([place]) 03/20/2015  . Detrusor instability   . Elevated cholesterol   . GERD (gastroesophageal reflux disease)   . Hot flashes   . Humerus head fracture, left, with routine healing, subsequent encounter 2000  . Hypertension   . Melanoma ([place])   . Mixed basal-squamous cell carcinoma    skin  . Parkinson disease ([place])   . Stroke [facility])    on Plavix, no deficits    Past Surgical History:  Procedure Laterality Date  . BREAST LUMPECTOMY Left 04/2015   radiation  . BREAST LUMPECTOMY WITH RADIOACTIVE SEED AND SENTINEL LYMPH NODE BIOPSY Left 04/25/2015   Procedure: LEFT BREAST PARTIAL MASTECTOMY WITH RADIOACTIVE SEED AND SENTINEL LYMPH NODE MAPPING;  Surgeon: Erroll Luna, MD;  Location: [facility];  Service: General;  Laterality: Left;  . FOOT SURGERY  2004  . KNEE SURGERY  2007  . skin cancer excised    . TONSILLECTOMY      Social History   Socioeconomic  History  . Marital status: Married    Spouse name: Not on file  . Number of children: Not on file  . Years of education: Not on file  . Highest education level: Not on file  Occupational History  . Not on file  Social Needs  . Financial resource strain: Not on file  . Food insecurity:    Worry: Not on file    Inability: Not on file  . Transportation needs:    Medical: Not on file    Non-medical: Not on file  Tobacco Use  . Smoking status: Never Smoker  . Smokeless tobacco: Never Used  Substance and Sexual Activity  . Alcohol use: No    Alcohol/week: 0.0 standard drinks  . Drug use: No  . Sexual activity: Never    Birth control/protection: Post-menopausal    Comment: 1st intercourse 19 yo-2 partners  Lifestyle  . Physical activity:    Days per week: Not on file    Minutes per session: Not on file  . Stress: Not on file  Relationships  . Social connections:    Talks on phone: Not on file    Gets together: Not on file    Attends religious service: Not on file    Active member of club or organization: Not on file    Attends meetings of clubs or organizations: Not  on file    Relationship status: Not on file  . Intimate partner violence:    Fear of current or ex partner: Not on file    Emotionally abused: Not on file    Physically abused: Not on file    Forced sexual activity: Not on file  Other Topics Concern  . Not on file  Social History Narrative  . Not on file    Outpatient Encounter Medications as of 08/27/2018  Medication Sig  . anastrozole (ARIMIDEX) 1 MG tablet Take 1 tablet (1 mg total) by mouth daily.  . AZILECT 1 MG TABS tablet Take 1 tablet by mouth daily.  . Cholecalciferol (VITAMIN D) 2000 UNITS CAPS Take 1 capsule by mouth daily.  . clopidogrel (PLAVIX) 75 MG tablet Take 75 mg by mouth daily.  . diphenhydrAMINE (BENADRYL) 25 mg capsule Take 25 mg by mouth every 6 (six) hours as needed (for parkinson's symptoms).   . fluticasone (FLONASE) 50 MCG/ACT  nasal spray Place 2 sprays into both nostrils daily.  Marland Kitchen lisinopril (PRINIVIL,ZESTRIL) 10 MG tablet TAKE 1 TABLET BY MOUTH ONCE DAILY  . Magnesium 250 MG TABS Take 1 tablet by mouth daily.  . meclizine (ANTIVERT) 25 MG tablet TAKE 1 TABLET BY MOUTH THREE TIMES DAILY AS NEEDED FOR DIZZINESS  . Multiple Vitamin (MULTIVITAMIN) capsule Take 1 capsule by mouth daily.    Marland Kitchen MYRBETRIQ 25 MG TB24 tablet TAKE 1 TABLET BY MOUTH ONCE DAILY  . pramipexole (MIRAPEX) 0.25 MG tablet Take 2 tablets by mouth 3 (three) times daily.   . Risedronate Sodium 35 MG TBEC TAKE 1 TABLET BY MOUTH ONCE A WEEK  . rosuvastatin (CRESTOR) 10 MG tablet TAKE 1 TABLET BY MOUTH ONCE DAILY  . [EXPIRED] methylPREDNISolone acetate (DEPO-MEDROL) injection 80 mg    No facility-administered encounter medications on file as of 08/27/2018.     No Known Allergies  Review of Systems  Constitutional: Negative for chills, fatigue and fever.  Respiratory: Negative for cough, chest tightness and shortness of breath.   Cardiovascular: Negative for chest pain, palpitations and leg swelling.  Musculoskeletal: Positive for arthralgias and myalgias. Negative for back pain, neck pain and neck stiffness.  Neurological: Positive for numbness. Negative for weakness and headaches.  Psychiatric/Behavioral: Negative for confusion.  All other systems reviewed and are negative.       Objective:    BP (!) 156/77   Pulse (!) 50   Temp 98.9 F (37.2 C) (Oral)   Ht _0  (1.575 m)   Wt 164 lb (74.4 kg)   BMI 30.00 kg/m    Wt Readings from Last 3 Encounters:  08/27/18 164 lb (74.4 kg)  05/26/18 163 lb (73.9 kg)  05/06/18 161 lb 4.8 oz (73.2 kg)    Physical Exam Vitals signs and nursing note reviewed.  Constitutional:      General: She is not in acute distress.    Appearance: Normal appearance. She is not ill-appearing or toxic-appearing.  HENT:     Head: Normocephalic and atraumatic.     Mouth/Throat:     Mouth: Mucous membranes are  moist.     Pharynx: Oropharynx is clear.  Eyes:     Conjunctiva/sclera: Conjunctivae normal.     Pupils: Pupils are equal, round, and reactive to light.  Cardiovascular:     Rate and Rhythm: Normal rate and regular rhythm.     Pulses: Normal pulses.     Heart sounds: Normal heart sounds.  Pulmonary:     Effort: Pulmonary  effort is normal. No respiratory distress.     Breath sounds: Normal breath sounds.  Musculoskeletal:     Right hip: She exhibits tenderness. She exhibits normal range of motion, normal strength, no bony tenderness, no swelling, no crepitus, no deformity and no laceration.     Right knee: She exhibits swelling (chronic right knee pain). She exhibits normal range of motion, no ecchymosis, no deformity and no erythema.       Legs:  Skin:    General: Skin is warm and dry.     Capillary Refill: Capillary refill takes less than 2 seconds.  Neurological:     General: No focal deficit present.     Mental Status: She is alert and oriented to person, place, and time.  Psychiatric:        Mood and Affect: Mood normal.        Behavior: Behavior normal.        Thought Content: Thought content normal.        Judgment: Judgment normal.     Results for orders placed or performed in visit on 05/26/18  CBC with Differential/Platelet  Result Value Ref Range   WBC 5.3 3.4 - 10.8 x10E3/uL   RBC 4.31 3.77 - 5.28 x10E6/uL   Hemoglobin 13.2 11.1 - 15.9 g/dL   Hematocrit 39.4 34.0 - 46.6 %   MCV 91 79 - 97 fL   MCH 30.6 26.6 - 33.0 pg   MCHC 33.5 31.5 - 35.7 g/dL   RDW 12.8 12.3 - 15.4 %   Platelets 247 150 - 450 x10E3/uL   Neutrophils 60 Not Estab. %   Lymphs 22 Not Estab. %   Monocytes 13 Not Estab. %   Eos 4 Not Estab. %   Basos 1 Not Estab. %   Neutrophils Absolute 3.2 1.4 - 7.0 x10E3/uL   Lymphocytes Absolute 1.2 0.7 - 3.1 x10E3/uL   Monocytes Absolute 0.7 0.1 - 0.9 x10E3/uL   EOS (ABSOLUTE) 0.2 0.0 - 0.4 x10E3/uL   Basophils Absolute 0.0 0.0 - 0.2 x10E3/uL    Immature Granulocytes 0 Not Estab. %   Immature Grans (Abs) 0.0 0.0 - 0.1 x10E3/uL  BMP8+EGFR  Result Value Ref Range   Glucose 92 65 - 99 mg/dL   BUN 17 8 - 27 mg/dL   Creatinine, Ser 0.90 0.57 - 1.00 mg/dL   GFR calc non Af Amer 62 >59 mL/min/1.73   GFR calc Af Amer 72 >59 mL/min/1.73   BUN/Creatinine Ratio 19 12 - 28   Sodium 143 134 - 144 mmol/L   Potassium 4.4 3.5 - 5.2 mmol/L   Chloride 105 96 - 106 mmol/L   CO2 25 20 - 29 mmol/L   Calcium 9.1 8.7 - 10.3 mg/dL  Lipid panel  Result Value Ref Range   Cholesterol, Total 173 100 - 199 mg/dL   Triglycerides 93 0 - 149 mg/dL   HDL 62 >39 mg/dL   VLDL Cholesterol Cal 19 5 - 40 mg/dL   LDL Calculated 92 0 - 99 mg/dL   Chol/HDL Ratio 2.8 0.0 - 4.4 ratio  VITAMIN D 25 Hydroxy (Vit-D Deficiency, Fractures)  Result Value Ref Range   Vit D, 25-Hydroxy 55.9 30.0 - 100.0 ng/mL  Hepatic function panel  Result Value Ref Range   Total Protein 6.9 6.0 - 8.5 g/dL   Albumin 4.4 3.5 - 4.8 g/dL   Bilirubin Total 0.4 0.0 - 1.2 mg/dL   Bilirubin, Direct 0.12 0.00 - 0.40 mg/dL   Alkaline Phosphatase 59  39 - 117 IU/L   AST 20 0 - 40 IU/L   ALT 15 0 - 32 IU/L       Pertinent labs & imaging results that were available during my care of the patient were reviewed by me and considered in my medical decision making.  Assessment & Plan:  Shuntay was seen today for muscular pain in right groin area.  Diagnoses and all orders for this visit:  Sciatica of right side Strengthening and stretching discussed. If no improvement after 4 weeks, may need referral to PT. Continue tylenol as needed for pain. Moist heat. Report any new or worsening symptoms.  -     methylPREDNISolone acetate (DEPO-MEDROL) injection 80 mg    Continue all other maintenance medications.  Follow up plan: Return if symptoms worsen or fail to improve.  Educational handout given for sciatica  The above assessment and management plan was discussed with the patient. The patient  verbalized understanding of and has agreed to the management plan. Patient is aware to call the clinic if symptoms persist or worsen. Patient is aware when to return to the clinic for a follow-up visit. Patient educated on when it is appropriate to go to the emergency department.   Monia Pouch, FNP-C [facility] [phone]'

## 2018-08-30 ENCOUNTER — Ambulatory Visit: Payer: Medicare Other | Admitting: Pediatrics

## 2018-08-30 ENCOUNTER — Other Ambulatory Visit: Payer: Self-pay | Admitting: Family Medicine

## 2018-08-31 DIAGNOSIS — G20 Parkinson's disease: Secondary | ICD-10-CM | POA: Diagnosis not present

## 2018-10-01 ENCOUNTER — Ambulatory Visit: Payer: Medicare Other | Admitting: Family Medicine

## 2018-10-04 ENCOUNTER — Ambulatory Visit (INDEPENDENT_AMBULATORY_CARE_PROVIDER_SITE_OTHER): Payer: Medicare Other

## 2018-10-04 ENCOUNTER — Ambulatory Visit (INDEPENDENT_AMBULATORY_CARE_PROVIDER_SITE_OTHER): Payer: Medicare Other | Admitting: Family Medicine

## 2018-10-04 ENCOUNTER — Encounter: Payer: Self-pay | Admitting: Family Medicine

## 2018-10-04 VITALS — BP 133/79 | HR 87 | Temp 96.7°F | Ht 62.0 in | Wt 158.0 lb

## 2018-10-04 DIAGNOSIS — Z13820 Encounter for screening for osteoporosis: Secondary | ICD-10-CM

## 2018-10-04 DIAGNOSIS — G20 Parkinson's disease: Secondary | ICD-10-CM | POA: Diagnosis not present

## 2018-10-04 DIAGNOSIS — E7849 Other hyperlipidemia: Secondary | ICD-10-CM

## 2018-10-04 DIAGNOSIS — Z780 Asymptomatic menopausal state: Secondary | ICD-10-CM

## 2018-10-04 DIAGNOSIS — E559 Vitamin D deficiency, unspecified: Secondary | ICD-10-CM | POA: Diagnosis not present

## 2018-10-04 DIAGNOSIS — I10 Essential (primary) hypertension: Secondary | ICD-10-CM

## 2018-10-04 DIAGNOSIS — E785 Hyperlipidemia, unspecified: Secondary | ICD-10-CM | POA: Diagnosis not present

## 2018-10-04 NOTE — Patient Instructions (Addendum)
Medicare Annual Wellness Visit  [facility] and the medical providers at [facility] strive to bring you the best medical care.  In doing so we not only want to address your current medical conditions and concerns but also to detect new conditions early and prevent illness, disease and health-related problems.    Medicare offers a yearly Wellness Visit which allows our clinical staff to assess your need for preventative services including immunizations, lifestyle education, counseling to decrease risk of preventable diseases and screening for fall risk and other medical concerns.    This visit is provided free of charge (no copay) for all Medicare recipients. The clinical pharmacists at [facility] have begun to conduct these Wellness Visits which will also include a thorough review of all your medications.    As you primary medical provider recommend that you make an appointment for your Annual Wellness Visit if you have not done so already this year.  You may set up this appointment before you leave today or you may call back ([phone]) and schedule an appointment.  Please make sure when you call that you mention that you are scheduling your Annual Wellness Visit with the clinical pharmacist so that the appointment may be made for the proper length of time.     Continue current medications. Continue good therapeutic lifestyle changes which include good diet and exercise. Fall precautions discussed with patient. If an FOBT was given today- please return it to our front desk. If you are over 34 years old - you may need Prevnar 74 or the adult Pneumonia vaccine.  **Flu shots are available--- please call and schedule a FLU-CLINIC appointment**  After your visit with [place] today you will receive a survey in the mail or online from [employer] regarding your care with [place]. Please take a moment to fill this out. Your feedback is very  important to [place] as you can help [place] better understand your patient needs as well as improve your experience and satisfaction. WE CARE ABOUT YOU!!!   Continue to follow-up with a neurologist for your Parkinson's disease. Continue to follow-up with the breast surgeon because of your breast cancer and get mammograms regularly. This winter continue to drink plenty of fluids and stay well-hydrated Continue to be careful and not put yourself at risk for falling Continue to practice good hand and respiratory hygiene. Follow-up with neurosurgery as planned for deep brain stimulation

## 2018-10-04 NOTE — Progress Notes (Signed)
Subjective:    Patient ID: Tracey Juarez, female    DOB: 05-Dec-1940, 78 y.o.   MRN: [id number]  HPI  Pt here for follow up and management of chronic medical problems which includes hypertension and hyperlipidemia. She is taking medication regularly.  The patient is doing well and recovering from a recent gastrointestinal virus which is better.  She is due to get a chest x-ray return in FOBT and get lab work.  This patient has a history of breast cancer hyperlipidemia atrophic vaginitis kyphosis and postmenopausal osteoporosis along with Parkinson's disease.  She sees Dr. Hyman Bower in [place] for her Parkinson's.  She is on Mirapex.  Patient is pleasant and relaxed and has her ongoing tremor in the right hand.  She relates her history well and is excited about deep brain stimulation which is being planned at [facility].  Today she denies any chest pain pressure tightness or shortness of breath.  She denies any trouble with her stomach and the recent GI bug she only had nausea and this seems to be getting better.  She denies any problems with her stools or blood in the stool.  She is passing her water without problems.  She continues to see her neurologist every 6 months.   Patient Active Problem List   Diagnosis Date Noted  . 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 Senile purpura ([place]) 03/04/2017  . Pure hypercholesterolemia 03/04/2017  . Breast cancer of upper-outer quadrant of left female breast ([place]) 03/20/2015  . Osteopenia 08/15/2013  . Parkinson disease ([place])   . Hypertension   . Atrophic vaginitis   . Detrusor instability   . Melanoma ([place])   . Mixed basal-squamous cell carcinoma    Outpatient Encounter Medications as of 10/04/2018  Medication Sig  . anastrozole (ARIMIDEX) 1 MG tablet Take 1 tablet (1 mg total) by mouth daily.  . AZILECT 1 MG TABS tablet Take 1 tablet by mouth daily.    . Cholecalciferol (VITAMIN D) 2000 UNITS CAPS Take 1 capsule by mouth daily.  . clopidogrel (PLAVIX) 75 MG tablet Take 75 mg by mouth daily.  . diphenhydrAMINE (BENADRYL) 25 mg capsule Take 25 mg by mouth every 6 (six) hours as needed (for parkinson's symptoms).   . fluticasone (FLONASE) 50 MCG/ACT nasal spray Place 2 sprays into both nostrils daily.  Marland Kitchen lisinopril (PRINIVIL,ZESTRIL) 10 MG tablet TAKE 1 TABLET BY MOUTH ONCE DAILY  . Magnesium 250 MG TABS Take 1 tablet by mouth daily.  . meclizine (ANTIVERT) 25 MG tablet TAKE 1 TABLET BY MOUTH THREE TIMES DAILY AS NEEDED FOR DIZZINESS  . Multiple Vitamin (MULTIVITAMIN) capsule Take 1 capsule by mouth daily.    Marland Kitchen MYRBETRIQ 25 MG TB24 tablet TAKE 1 TABLET BY MOUTH ONCE DAILY  . pramipexole (MIRAPEX) 0.25 MG tablet Take 2 tablets by mouth 3 (three) times daily.   . Risedronate Sodium 35 MG TBEC TAKE 1 TABLET BY MOUTH ONCE A WEEK  . rosuvastatin (CRESTOR) 10 MG tablet TAKE 1 TABLET BY MOUTH ONCE DAILY   No facility-administered encounter medications on file as of 10/04/2018.      Review of Systems  Constitutional: Negative.   HENT: Negative.   Eyes: Negative.   Respiratory: Negative.   Cardiovascular: Negative.   Gastrointestinal: Negative.        Recent GI bug - feeling better   Endocrine: Negative.   Genitourinary: Negative.   Musculoskeletal: Negative.   Skin:  Negative.   Allergic/Immunologic: Negative.   Neurological: Negative.   Hematological: Negative.   Psychiatric/Behavioral: Negative.        Objective:   Physical Exam Vitals signs and nursing note reviewed.  Constitutional:      General: She is not in acute distress.    Appearance: Normal appearance. She is well-developed. She is not ill-appearing.     Comments: The patient is pleasant and alert and quiet spoken.  HENT:     Head: Normocephalic and atraumatic.     Right Ear: Tympanic membrane, ear canal and external ear normal. There is no impacted cerumen.     Left  Ear: Tympanic membrane and external ear normal. There is no impacted cerumen.     Nose: Nose normal. No congestion.     Mouth/Throat:     Mouth: Mucous membranes are dry.     Pharynx: Oropharynx is clear. No oropharyngeal exudate.  Eyes:     General: No scleral icterus.       Right eye: No discharge.        Left eye: No discharge.     Extraocular Movements: Extraocular movements intact.     Conjunctiva/sclera: Conjunctivae normal.     Pupils: Pupils are equal, round, and reactive to light.  Neck:     Musculoskeletal: Normal range of motion and neck supple. No neck rigidity.     Thyroid: No thyromegaly.     Vascular: No carotid bruit or JVD.  Cardiovascular:     Rate and Rhythm: Normal rate and regular rhythm.     Pulses: Normal pulses.     Heart sounds: Normal heart sounds. No murmur.     Comments: Heart is regular at 72/min with good pedal pulses and no edema Pulmonary:     Effort: Pulmonary effort is normal. No respiratory distress.     Breath sounds: Normal breath sounds. No wheezing or rales.  Abdominal:     General: Abdomen is flat. Bowel sounds are normal.     Palpations: Abdomen is soft. There is no mass.     Tenderness: There is no abdominal tenderness.     Comments: No masses tenderness organ enlargement or bruits  Musculoskeletal: Normal range of motion.        General: Deformity present. No tenderness.     Right lower leg: No edema.     Left lower leg: No edema.     Comments: Thoracic kyphosis  Lymphadenopathy:     Cervical: No cervical adenopathy.  Skin:    General: Skin is warm and dry.     Findings: No rash.  Neurological:     Mental Status: She is alert and oriented to person, place, and time. Mental status is at baseline.     Cranial Nerves: No cranial nerve deficit.     Motor: No weakness.     Coordination: Coordination normal.     Gait: Gait abnormal.     Deep Tendon Reflexes: Reflexes abnormal.     Comments: Patient has a right hand tremor and some  slight gait disturbance with walking.  Psychiatric:        Mood and Affect: Mood normal.        Behavior: Behavior normal.        Thought Content: Thought content normal.        Judgment: Judgment normal.     Comments: The patient's mood is positive and she is excited about the possibility of DBS helping her Parkinson's.     BP  133/79 (BP Location: Right Arm)   Pulse 87   Temp (!) 96.7 F (35.9 C) (Oral)   Ht '5\' 2"'  (1.575 m)   Wt 158 lb (71.7 kg)   BMI 28.90 kg/m        Assessment & Plan:  1. Essential hypertension -The blood pressure is good she will continue with current treatment - BMP8+EGFR - CBC with Differential/Platelet - Hepatic function panel - DG Chest 2 View; Future  2. Other hyperlipidemia -Continue with current treatment and with as aggressive therapeutic lifestyle changes as possible including diet and exercise - CBC with Differential/Platelet - Lipid panel - DG Chest 2 View; Future  3. Vitamin D deficiency -Continue with vitamin D replacement pending results of lab work - CBC with Differential/Platelet - VITAMIN D 25 Hydroxy (Vit-D Deficiency, Fractures)  4. Parkinson disease ([place]) -Continue follow-up with Dr. Bjorn Loser and neurosurgery at [facility] for possible deep brain stimulation - CBC with Differential/Platelet  5. Screening for osteoporosis - CBC with Differential/Platelet  6. Postmenopausal - CBC with Differential/Platelet  Patient Instructions                       Medicare Annual Wellness Visit  [facility] and the medical providers at [facility] strive to bring you the best medical care.  In doing so we not only want to address your current medical conditions and concerns but also to detect new conditions early and prevent illness, disease and health-related problems.    Medicare offers a yearly Wellness Visit which allows our clinical staff to assess your need for preventative  services including immunizations, lifestyle education, counseling to decrease risk of preventable diseases and screening for fall risk and other medical concerns.    This visit is provided free of charge (no copay) for all Medicare recipients. The clinical pharmacists at [facility] have begun to conduct these Wellness Visits which will also include a thorough review of all your medications.    As you primary medical provider recommend that you make an appointment for your Annual Wellness Visit if you have not done so already this year.  You may set up this appointment before you leave today or you may call back ([phone]) and schedule an appointment.  Please make sure when you call that you mention that you are scheduling your Annual Wellness Visit with the clinical pharmacist so that the appointment may be made for the proper length of time.     Continue current medications. Continue good therapeutic lifestyle changes which include good diet and exercise. Fall precautions discussed with patient. If an FOBT was given today- please return it to our front desk. If you are over 38 years old - you may need Prevnar 85 or the adult Pneumonia vaccine.  **Flu shots are available--- please call and schedule a FLU-CLINIC appointment**  After your visit with [place] today you will receive a survey in the mail or online from [employer] regarding your care with [place]. Please take a moment to fill this out. Your feedback is very important to [place] as you can help [place] better understand your patient needs as well as improve your experience and satisfaction. WE CARE ABOUT YOU!!!   Continue to follow-up with a neurologist for your Parkinson's disease. Continue to follow-up with the breast surgeon because of your breast cancer and get mammograms regularly. This winter continue to drink plenty of fluids and stay well-hydrated Continue to be  careful and not put yourself at risk for falling Continue to  practice good hand and respiratory hygiene. Follow-up with neurosurgery as planned for deep brain stimulation   Arrie Senate MD

## 2018-10-05 LAB — BMP8+EGFR
BUN/Creatinine Ratio: 14 (ref 12–28)
BUN: 11 mg/dL (ref 8–27)
CALCIUM: 8.8 mg/dL (ref 8.7–10.3)
CO2: 22 mmol/L (ref 20–29)
Chloride: 101 mmol/L (ref 96–106)
Creatinine, Ser: 0.81 mg/dL (ref 0.57–1.00)
GFR calc Af Amer: 82 mL/min/{1.73_m2} (ref >59)
GFR calc non Af Amer: 71 mL/min/{1.73_m2} (ref >59)
GLUCOSE: 91 mg/dL (ref 65–99)
POTASSIUM: 4.3 mmol/L (ref 3.5–5.2)
SODIUM: 140 mmol/L (ref 134–144)

## 2018-10-05 LAB — CBC WITH DIFFERENTIAL/PLATELET
BASOS ABS: 0 10*3/uL (ref 0.0–0.2)
BASOS: 0 %
EOS (ABSOLUTE): 0.1 10*3/uL (ref 0.0–0.4)
Eos: 2 %
Hematocrit: 40.6 % (ref 34.0–46.6)
Hemoglobin: 13.7 g/dL (ref 11.1–15.9)
IMMATURE GRANS (ABS): 0 10*3/uL (ref 0.0–0.1)
IMMATURE GRANULOCYTES: 0 %
LYMPHS: 23 %
Lymphocytes Absolute: 1.1 10*3/uL (ref 0.7–3.1)
MCH: 30 pg (ref 26.6–33.0)
MCHC: 33.7 g/dL (ref 31.5–35.7)
MCV: 89 fL (ref 79–97)
MONOS ABS: 0.6 10*3/uL (ref 0.1–0.9)
Monocytes: 13 %
NEUTROS PCT: 62 %
Neutrophils Absolute: 2.8 10*3/uL (ref 1.4–7.0)
Platelets: 222 10*3/uL (ref 150–450)
RBC: 4.56 x10E6/uL (ref 3.77–5.28)
RDW: 13 % (ref 11.7–15.4)
WBC: 4.5 10*3/uL (ref 3.4–10.8)

## 2018-10-05 LAB — HEPATIC FUNCTION PANEL
ALT: 24 IU/L (ref 0–32)
AST: 28 IU/L (ref 0–40)
Albumin: 4.1 g/dL (ref 3.7–4.7)
Alkaline Phosphatase: 51 IU/L (ref 39–117)
BILIRUBIN TOTAL: 0.2 mg/dL (ref 0.0–1.2)
Bilirubin, Direct: 0.11 mg/dL (ref 0.00–0.40)
Total Protein: 6.1 g/dL (ref 6.0–8.5)

## 2018-10-05 LAB — LIPID PANEL
CHOLESTEROL TOTAL: 124 mg/dL (ref 100–199)
Chol/HDL Ratio: 2.6 ratio (ref 0.0–4.4)
HDL: 47 mg/dL (ref >39)
LDL Calculated: 56 mg/dL (ref 0–99)
Triglycerides: 106 mg/dL (ref 0–149)
VLDL CHOLESTEROL CAL: 21 mg/dL (ref 5–40)

## 2018-10-05 LAB — VITAMIN D 25 HYDROXY (VIT D DEFICIENCY, FRACTURES): Vit D, 25-Hydroxy: 54.3 ng/mL (ref 30.0–100.0)

## 2018-10-07 DIAGNOSIS — Z79899 Other long term (current) drug therapy: Secondary | ICD-10-CM | POA: Diagnosis not present

## 2018-10-07 DIAGNOSIS — R110 Nausea: Secondary | ICD-10-CM | POA: Diagnosis not present

## 2018-10-07 DIAGNOSIS — T428X5A Adverse effect of antiparkinsonism drugs and other central muscle-tone depressants, initial encounter: Secondary | ICD-10-CM | POA: Diagnosis not present

## 2018-10-07 DIAGNOSIS — G20 Parkinson's disease: Secondary | ICD-10-CM | POA: Diagnosis not present

## 2018-10-13 DIAGNOSIS — C50412 Malignant neoplasm of upper-outer quadrant of left female breast: Secondary | ICD-10-CM | POA: Diagnosis not present

## 2018-10-13 DIAGNOSIS — G20 Parkinson's disease: Secondary | ICD-10-CM | POA: Diagnosis not present

## 2018-10-13 DIAGNOSIS — M810 Age-related osteoporosis without current pathological fracture: Secondary | ICD-10-CM | POA: Diagnosis not present

## 2018-10-13 DIAGNOSIS — E785 Hyperlipidemia, unspecified: Secondary | ICD-10-CM | POA: Diagnosis not present

## 2018-10-13 DIAGNOSIS — Z8673 Personal history of transient ischemic attack (TIA), and cerebral infarction without residual deficits: Secondary | ICD-10-CM | POA: Diagnosis not present

## 2018-10-20 ENCOUNTER — Other Ambulatory Visit: Payer: Self-pay | Admitting: Family Medicine

## 2018-11-02 DIAGNOSIS — L821 Other seborrheic keratosis: Secondary | ICD-10-CM | POA: Diagnosis not present

## 2018-11-02 DIAGNOSIS — Z85828 Personal history of other malignant neoplasm of skin: Secondary | ICD-10-CM | POA: Diagnosis not present

## 2018-11-02 DIAGNOSIS — L570 Actinic keratosis: Secondary | ICD-10-CM | POA: Diagnosis not present

## 2018-12-29 DIAGNOSIS — G20C Parkinsonism, unspecified: Secondary | ICD-10-CM | POA: Insufficient documentation

## 2018-12-29 DIAGNOSIS — I6782 Cerebral ischemia: Secondary | ICD-10-CM | POA: Diagnosis not present

## 2018-12-29 DIAGNOSIS — G20 Parkinson's disease: Secondary | ICD-10-CM | POA: Diagnosis not present

## 2019-01-05 ENCOUNTER — Other Ambulatory Visit: Payer: Self-pay | Admitting: Family Medicine

## 2019-01-06 ENCOUNTER — Ambulatory Visit (INDEPENDENT_AMBULATORY_CARE_PROVIDER_SITE_OTHER): Payer: Medicare Other | Admitting: Family Medicine

## 2019-01-06 ENCOUNTER — Ambulatory Visit (INDEPENDENT_AMBULATORY_CARE_PROVIDER_SITE_OTHER): Payer: Medicare Other

## 2019-01-06 ENCOUNTER — Telehealth: Payer: Self-pay | Admitting: Family Medicine

## 2019-01-06 ENCOUNTER — Other Ambulatory Visit: Payer: Self-pay

## 2019-01-06 ENCOUNTER — Encounter: Payer: Self-pay | Admitting: Family Medicine

## 2019-01-06 VITALS — BP 137/77 | HR 89 | Temp 98.4°F | Ht 62.0 in | Wt 162.0 lb

## 2019-01-06 DIAGNOSIS — G8929 Other chronic pain: Secondary | ICD-10-CM

## 2019-01-06 DIAGNOSIS — M19071 Primary osteoarthritis, right ankle and foot: Secondary | ICD-10-CM

## 2019-01-06 DIAGNOSIS — M25571 Pain in right ankle and joints of right foot: Secondary | ICD-10-CM | POA: Diagnosis not present

## 2019-01-06 MED ORDER — DICLOFENAC SODIUM 1 % TD GEL: 2.0000 g | Freq: Four times a day (QID) | TRANSDERMAL | 1 refills | Status: DC

## 2019-01-06 MED ORDER — METHYLPREDNISOLONE ACETATE 40 MG/ML IJ SUSP
40.0000 mg | Freq: Once | INTRAMUSCULAR | Status: AC
  Administered 2019-01-06: 40 mg via INTRAMUSCULAR

## 2019-01-06 NOTE — Progress Notes (Signed)
Subjective:  Patient ID: Tracey Juarez, female    DOB: 1941-04-05, 78 y.o.   MRN: [id number]  Chief Complaint:  Ankle Pain (Right  No injury)   HPI: Tracey Juarez is a 78 y.o. female presenting on 01/06/2019 for Ankle Pain (Right  No injury)  Pt presents today for ongoing right ankle pain. Pt states this started several months ago and has worsened over the last 2 weeks. No injury. States the pain is worse with activity and weight bearing. She has taken tylenol arthritis with minimal relief. No other associated symptoms.   Relevant past medical, surgical, family, and social history reviewed and updated as indicated.  Allergies and medications reviewed and updated.   Past Medical History:  Diagnosis Date  . Atrophic vaginitis   . Breast cancer in female [facility])   . Breast cancer of upper-outer quadrant of left female breast ([place]) 03/20/2015  . Detrusor instability   . Elevated cholesterol   . GERD (gastroesophageal reflux disease)   . Hot flashes   . Humerus head fracture, left, with routine healing, subsequent encounter 2000  . Hypertension   . Melanoma ([place])   . Mixed basal-squamous cell carcinoma    skin  . Parkinson disease ([place])   . Stroke [facility])    on Plavix, no deficits    Past Surgical History:  Procedure Laterality Date  . BREAST LUMPECTOMY Left 04/2015   radiation  . BREAST LUMPECTOMY WITH RADIOACTIVE SEED AND SENTINEL LYMPH NODE BIOPSY Left 04/25/2015   Procedure: LEFT BREAST PARTIAL MASTECTOMY WITH RADIOACTIVE SEED AND SENTINEL LYMPH NODE MAPPING;  Surgeon: Erroll Luna, MD;  Location: [facility];  Service: General;  Laterality: Left;  . FOOT SURGERY  2004  . KNEE SURGERY  2007  . skin cancer excised    . TONSILLECTOMY      Social History   Socioeconomic History  . Marital status: Married    Spouse name: Not on file  . Number of children: Not on file  . Years of education: Not on file  . Highest education level: Not on file   Occupational History  . Not on file  Social Needs  . Financial resource strain: Not on file  . Food insecurity:    Worry: Not on file    Inability: Not on file  . Transportation needs:    Medical: Not on file    Non-medical: Not on file  Tobacco Use  . Smoking status: Never Smoker  . Smokeless tobacco: Never Used  Substance and Sexual Activity  . Alcohol use: No    Alcohol/week: 0.0 standard drinks  . Drug use: No  . Sexual activity: Never    Birth control/protection: Post-menopausal    Comment: 1st intercourse 79 yo-2 partners  Lifestyle  . Physical activity:    Days per week: Not on file    Minutes per session: Not on file  . Stress: Not on file  Relationships  . Social connections:    Talks on phone: Not on file    Gets together: Not on file    Attends religious service: Not on file    Active member of club or organization: Not on file    Attends meetings of clubs or organizations: Not on file    Relationship status: Not on file  . Intimate partner violence:    Fear of current or ex partner: Not on file    Emotionally abused: Not on file    Physically abused: Not  on file    Forced sexual activity: Not on file  Other Topics Concern  . Not on file  Social History Narrative  . Not on file    Outpatient Encounter Medications as of 01/06/2019  Medication Sig  . anastrozole (ARIMIDEX) 1 MG tablet Take 1 tablet (1 mg total) by mouth daily.  . AZILECT 1 MG TABS tablet Take 1 tablet by mouth daily.  . Cholecalciferol (VITAMIN D) 2000 UNITS CAPS Take 1 capsule by mouth daily.  . clopidogrel (PLAVIX) 75 MG tablet Take 75 mg by mouth daily.  . diphenhydrAMINE (BENADRYL) 25 mg capsule Take 25 mg by mouth every 6 (six) hours as needed (for parkinson's symptoms).   . fluticasone (FLONASE) 50 MCG/ACT nasal spray Place 2 sprays into both nostrils daily.  Marland Kitchen lisinopril (PRINIVIL,ZESTRIL) 10 MG tablet Take 1 tablet by mouth once daily  . Magnesium 250 MG TABS Take 1 tablet by  mouth daily.  . meclizine (ANTIVERT) 25 MG tablet TAKE 1 TABLET BY MOUTH THREE TIMES DAILY AS NEEDED FOR DIZZINESS  . Multiple Vitamin (MULTIVITAMIN) capsule Take 1 capsule by mouth daily.    Marland Kitchen MYRBETRIQ 25 MG TB24 tablet TAKE 1 TABLET BY MOUTH ONCE DAILY  . Risedronate Sodium 35 MG TBEC TAKE 1 TABLET BY MOUTH ONCE A WEEK  . rosuvastatin (CRESTOR) 10 MG tablet Take 1 tablet by mouth once daily  . diclofenac sodium (VOLTAREN) 1 % GEL Apply 2 g topically 4 (four) times daily.  . pramipexole (MIRAPEX) 0.25 MG tablet Take 2 tablets by mouth 3 (three) times daily.    Facility-Administered Encounter Medications as of 01/06/2019  Medication  . methylPREDNISolone acetate (DEPO-MEDROL) injection 40 mg    No Known Allergies  Review of Systems  Constitutional: Negative for activity change, appetite change, chills, diaphoresis, fatigue, fever and unexpected weight change.  Respiratory: Negative for cough, chest tightness and shortness of breath.   Cardiovascular: Negative for chest pain, palpitations and leg swelling.  Musculoskeletal: Positive for arthralgias, joint swelling (right knee) and myalgias. Negative for back pain, gait problem, neck pain and neck stiffness.  Neurological: Negative for weakness and numbness.  Psychiatric/Behavioral: Negative for confusion.  All other systems reviewed and are negative.       Objective:  BP 137/77   Pulse 89   Temp 98.4 F (36.9 C) (Oral)   Ht '5\' 2"'  (1.575 m)   Wt 162 lb (73.5 kg)   BMI 29.63 kg/m    Wt Readings from Last 3 Encounters:  01/06/19 162 lb (73.5 kg)  10/04/18 158 lb (71.7 kg)  08/27/18 164 lb (74.4 kg)    Physical Exam Vitals signs and nursing note reviewed.  Constitutional:      General: She is not in acute distress.    Appearance: Normal appearance. She is well-developed and well-groomed. She is not ill-appearing, toxic-appearing or diaphoretic.  HENT:     Head: Normocephalic and atraumatic.     Mouth/Throat:     Mouth:  Mucous membranes are moist.     Pharynx: Oropharynx is clear.  Eyes:     Extraocular Movements: Extraocular movements intact.     Conjunctiva/sclera: Conjunctivae normal.     Pupils: Pupils are equal, round, and reactive to light.  Neck:     Musculoskeletal: Normal range of motion and neck supple.  Cardiovascular:     Rate and Rhythm: Normal rate and regular rhythm.     Pulses: Normal pulses.     Heart sounds: Normal heart sounds. No murmur. No  friction rub. No gallop.   Pulmonary:     Effort: Pulmonary effort is normal. No respiratory distress.     Breath sounds: Normal breath sounds.  Musculoskeletal:     Right hip: Normal.     Right knee: She exhibits decreased range of motion and swelling. She exhibits no ecchymosis, no deformity and no erythema. Tenderness found.     Right ankle: She exhibits normal range of motion, no swelling, no ecchymosis, no deformity, no laceration and normal pulse. Tenderness.  Skin:    General: Skin is warm and dry.     Capillary Refill: Capillary refill takes less than 2 seconds.  Neurological:     General: No focal deficit present.     Mental Status: She is alert and oriented to person, place, and time.  Psychiatric:        Mood and Affect: Mood normal.        Behavior: Behavior normal. Behavior is cooperative.        Thought Content: Thought content normal.        Judgment: Judgment normal.     Results for orders placed or performed in visit on 10/04/18  [facility]  Result Value Ref Range   Glucose 91 65 - 99 mg/dL   BUN 11 8 - 27 mg/dL   Creatinine, Ser 0.81 0.57 - 1.00 mg/dL   GFR calc non Af Amer 71 >59 mL/min/1.73   GFR calc Af Amer 82 >59 mL/min/1.73   BUN/Creatinine Ratio 14 12 - 28   Sodium 140 134 - 144 mmol/L   Potassium 4.3 3.5 - 5.2 mmol/L   Chloride 101 96 - 106 mmol/L   CO2 22 20 - 29 mmol/L   Calcium 8.8 8.7 - 10.3 mg/dL  CBC with Differential/Platelet  Result Value Ref Range   WBC 4.5 3.4 - 10.8 x10E3/uL   RBC 4.56 3.77  - 5.28 x10E6/uL   Hemoglobin 13.7 11.1 - 15.9 g/dL   Hematocrit 40.6 34.0 - 46.6 %   MCV 89 79 - 97 fL   MCH 30.0 26.6 - 33.0 pg   MCHC 33.7 31.5 - 35.7 g/dL   RDW 13.0 11.7 - 15.4 %   Platelets 222 150 - 450 x10E3/uL   Neutrophils 62 Not Estab. %   Lymphs 23 Not Estab. %   Monocytes 13 Not Estab. %   Eos 2 Not Estab. %   Basos 0 Not Estab. %   Neutrophils Absolute 2.8 1.4 - 7.0 x10E3/uL   Lymphocytes Absolute 1.1 0.7 - 3.1 x10E3/uL   Monocytes Absolute 0.6 0.1 - 0.9 x10E3/uL   EOS (ABSOLUTE) 0.1 0.0 - 0.4 x10E3/uL   Basophils Absolute 0.0 0.0 - 0.2 x10E3/uL   Immature Granulocytes 0 Not Estab. %   Immature Grans (Abs) 0.0 0.0 - 0.1 x10E3/uL  Lipid panel  Result Value Ref Range   Cholesterol, Total 124 100 - 199 mg/dL   Triglycerides 106 0 - 149 mg/dL   HDL 47 >39 mg/dL   VLDL Cholesterol Cal 21 5 - 40 mg/dL   LDL Calculated 56 0 - 99 mg/dL   Chol/HDL Ratio 2.6 0.0 - 4.4 ratio  VITAMIN D 25 Hydroxy (Vit-D Deficiency, Fractures)  Result Value Ref Range   Vit D, 25-Hydroxy 54.3 30.0 - 100.0 ng/mL  Hepatic function panel  Result Value Ref Range   Total Protein 6.1 6.0 - 8.5 g/dL   Albumin 4.1 3.7 - 4.7 g/dL   Bilirubin Total 0.2 0.0 - 1.2 mg/dL   Bilirubin, Direct  0.11 0.00 - 0.40 mg/dL   Alkaline Phosphatase 51 39 - 117 IU/L   AST 28 0 - 40 IU/L   ALT 24 0 - 32 IU/L     X-Ray: right ankle: degenerative changes, no acute bony abnormalities. Preliminary x-ray reading by Monia Pouch, FNP-C, [facility].   Pertinent labs & imaging results that were available during my care of the patient were reviewed by me and considered in my medical decision making.  Assessment & Plan:  Sahory was seen today for ankle pain.  Diagnoses and all orders for this visit:  Chronic pain of right ankle Xray with no acute bony abnormalities. Degenerative changes. Symptomatic care discussed. Topical NSAID as prescribed. Report any new or worsening symptoms.  -     DG Ankle Complete Right; Future -      diclofenac sodium (VOLTAREN) 1 % GEL; Apply 2 g topically 4 (four) times daily. -     methylPREDNISolone acetate (DEPO-MEDROL) injection 40 mg  Primary osteoarthritis of right ankle Symptomatic care discussed. Report any new or worsening symptoms. May need referral to PT.  -     methylPREDNISolone acetate (DEPO-MEDROL) injection 40 mg     Continue all other maintenance medications.  Follow up plan: Return in about 4 weeks (around 02/03/2019), or if symptoms worsen or fail to improve, for ankle .  Educational handout given for ankle pain  The above assessment and management plan was discussed with the patient. The patient verbalized understanding of and has agreed to the management plan. Patient is aware to call the clinic if symptoms persist or worsen. Patient is aware when to return to the clinic for a follow-up visit. Patient educated on when it is appropriate to go to the emergency department.   Monia Pouch, FNP-C [facility] [phone]

## 2019-01-06 NOTE — Patient Instructions (Signed)
Ankle Pain  The ankle joint holds your body weight and allows you to move around. Ankle pain can occur on either side or the back of one ankle or both ankles. Ankle pain may be sharp and burning or dull and aching. There may be tenderness, stiffness, redness, or warmth around the ankle. Many things can cause ankle pain, including an injury to the area and overuse of the ankle.  Follow these instructions at home:  Activity   Rest your ankle as told by your health care provider. Avoid any activities that cause ankle pain.   Do not use the injured limb to support your body weight until your health care provider says that you can. Use crutches as told by your health care provider.   Do exercises as told by your health care provider.   Ask your health care provider when it is safe to drive if you have a brace on your ankle.  If you have a brace:   Wear the brace as told by your health care provider. Remove it only as told by your health care provider.   Loosen the brace if your toes tingle, become numb, or turn cold and blue.   Keep the brace clean.   If the brace is not waterproof:  ? Do not let it get wet.  ? Cover it with a watertight covering when you take a bath or shower.  If you were given an elastic bandage:     Remove it when you take a bath or a shower.   Try not to move your ankle very much, but wiggle your toes from time to time. This helps to prevent swelling.   Adjust the bandage to make it more comfortable if it feels too tight.   Loosen the bandage if you have numbness or tingling in your foot or if your foot turns cold and blue.  Managing pain, stiffness, and swelling     If directed, put ice on the painful area.  ? If you have a removable brace or elastic bandage, remove it as told by your health care provider.  ? Put ice in a plastic bag.  ? Place a towel between your skin and the bag.  ? Leave the ice on for 20 minutes, 2-3 times a day.   Move your toes often to avoid stiffness and to  lessen swelling.   Raise (elevate) your ankle above the level of your heart while you are sitting or lying down.  General instructions   Record information about your pain. Writing down the following may be helpful for you and your health care provider:  ? How often you have ankle pain.  ? Where the pain is located.  ? What the pain feels like.   If treatment involves wearing a prescribed shoe or insole, make sure you wear it correctly and for as long as told by your health care provider.   Take over-the-counter and prescription medicines only as told by your health care provider.   Keep all follow-up visits as told by your health care provider. This is important.  Contact a health care provider if:   Your pain gets worse.   Your pain is not relieved with medicines.   You have a fever or chills.   You are having more trouble with walking.   You have new symptoms.  Get help right away if:   Your foot, leg, toes, or ankle:  ? Tingles or becomes numb.  ? Becomes   swollen.  ? Turns pale or blue.  Summary   Ankle pain can occur on either side or the back of one ankle or both ankles.   Ankle pain may be sharp and burning or dull and aching.   Rest your ankle as told by your health care provider. If told, apply ice to the area.   Take over-the-counter and prescription medicines only as told by your health care provider.  This information is not intended to replace advice given to you by your health care provider. Make sure you discuss any questions you have with your health care provider.  Document Released: 01/15/2010 Document Revised: 02/03/2018 Document Reviewed: 02/03/2018  Elsevier Interactive Patient Education  2019 [employer]

## 2019-01-12 DIAGNOSIS — G20 Parkinson's disease: Secondary | ICD-10-CM | POA: Diagnosis not present

## 2019-01-12 DIAGNOSIS — Z79899 Other long term (current) drug therapy: Secondary | ICD-10-CM | POA: Diagnosis not present

## 2019-02-03 ENCOUNTER — Other Ambulatory Visit: Payer: Self-pay

## 2019-02-03 ENCOUNTER — Ambulatory Visit (INDEPENDENT_AMBULATORY_CARE_PROVIDER_SITE_OTHER): Payer: Medicare Other | Admitting: Family Medicine

## 2019-02-03 ENCOUNTER — Encounter: Payer: Self-pay | Admitting: Family Medicine

## 2019-02-03 DIAGNOSIS — I10 Essential (primary) hypertension: Secondary | ICD-10-CM

## 2019-02-03 DIAGNOSIS — M19071 Primary osteoarthritis, right ankle and foot: Secondary | ICD-10-CM

## 2019-02-03 DIAGNOSIS — M159 Polyosteoarthritis, unspecified: Secondary | ICD-10-CM | POA: Insufficient documentation

## 2019-02-03 DIAGNOSIS — G20A1 Parkinson's disease without dyskinesia, without mention of fluctuations: Secondary | ICD-10-CM

## 2019-02-03 DIAGNOSIS — M150 Primary generalized (osteo)arthritis: Secondary | ICD-10-CM

## 2019-02-03 DIAGNOSIS — C50412 Malignant neoplasm of upper-outer quadrant of left female breast: Secondary | ICD-10-CM | POA: Diagnosis not present

## 2019-02-03 DIAGNOSIS — C439 Malignant melanoma of skin, unspecified: Secondary | ICD-10-CM

## 2019-02-03 DIAGNOSIS — M4004 Postural kyphosis, thoracic region: Secondary | ICD-10-CM

## 2019-02-03 DIAGNOSIS — Z8673 Personal history of transient ischemic attack (TIA), and cerebral infarction without residual deficits: Secondary | ICD-10-CM | POA: Diagnosis not present

## 2019-02-03 DIAGNOSIS — E559 Vitamin D deficiency, unspecified: Secondary | ICD-10-CM | POA: Diagnosis not present

## 2019-02-03 DIAGNOSIS — E7800 Pure hypercholesterolemia, unspecified: Secondary | ICD-10-CM

## 2019-02-03 DIAGNOSIS — C4499 Other specified malignant neoplasm of skin, unspecified: Secondary | ICD-10-CM

## 2019-02-03 DIAGNOSIS — M8589 Other specified disorders of bone density and structure, multiple sites: Secondary | ICD-10-CM

## 2019-02-03 DIAGNOSIS — G20 Parkinson's disease: Secondary | ICD-10-CM

## 2019-02-03 DIAGNOSIS — Z0000 Encounter for general adult medical examination without abnormal findings: Secondary | ICD-10-CM

## 2019-02-03 NOTE — Patient Instructions (Addendum)
Continue to follow-up with neurology/neurosurgery Continue to follow-up with breast cancer specialist, including oncologist and oncological surgeon Continue to practice good respiratory and hand hygiene because of coronavirus Drink plenty of water and fluids and stay well-hydrated Follow-up with orthopedics as needed Follow-up with dermatology for history of melanoma Do not forget to get DEXA scans every 2 years

## 2019-02-03 NOTE — Addendum Note (Signed)
Addended by: Zannie Cove on: 02/03/2019 01:24 PM   Modules accepted: Orders

## 2019-02-03 NOTE — Progress Notes (Signed)
Virtual Visit Via telephone Note I connected with@ on 02/03/19 by telephone and verified that I am speaking with the correct person or authorized healthcare agent using two identifiers. Tracey Juarez is currently located at home and there are no unauthorized people in close proximity. I completed this visit while in a private location in my home .  This visit type was conducted due to national recommendations for restrictions regarding the COVID-19 Pandemic (e.g. social distancing).  This format is felt to be most appropriate for this patient at this time.  All issues noted in this document were discussed and addressed.  No physical exam was performed.    I discussed the limitations, risks, security and privacy concerns of performing an evaluation and management service by telephone and the availability of in person appointments. I also discussed with the patient that there may be a patient responsible charge related to this service. The patient expressed understanding and agreed to proceed.   Date:  02/03/2019    ID:  Caprice Beaver      Jul 20, 1941        [id number]   Patient Care Team Patient Care Team: Chipper Herb, MD as PCP - General (Family Medicine) Phineas Real, Belinda Block, MD as Consulting Physician (Gynecology) Erroll Luna, MD as Consulting Physician (General Surgery) Truitt Merle, MD as Consulting Physician (Hematology) Thea Silversmith, MD as Consulting Physician (Radiation Oncology) Mauro Kaufmann, RN as Registered Nurse Rockwell Germany, RN as Registered Nurse Jake Shark, Johny Blamer, NP as Nurse Practitioner (Nurse Practitioner) Ellwood Dense., MD as Referring Physician (Neurology)  Reason for Visit: Primary Care Follow-up     History of Present Illness & Review of Systems:     Tracey Juarez is a 78 y.o. year old female primary care patient that presents today for a telehealth visit.  The patient is pleasant and doing well and very alert.  She denies any  chest pain pressure tightness or shortness of breath.  She denies any trouble with swallowing heartburn indigestion nausea vomiting diarrhea blood in the stool or change in bowel habits.  She is passing her water well.  She follows up closely with the breast surgeon and oncologist because of her breast cancer.  She also has a history of melanoma and should be following up with her dermatologist.  She was planning to have stem cell surgery back in the spring but because of coronavirus this is been postponed until late August or early September.  She is seeing a neurologist and a neurosurgeon in correlation with this.  Recently carbidopa levodopa was added to her current treatment regimen at 25 103 times a day.  Also her Mirapex has been increased to 0.5   3 times a day.  Review of systems as stated, otherwise negative.  The patient does not have symptoms concerning for COVID-19 infection (fever, chills, cough, or new shortness of breath).      Current Medications (Verified) Allergies as of 02/03/2019   No Known Allergies     Medication List       Accurate as of February 03, 2019 11:15 AM. If you have any questions, ask your nurse or doctor.        anastrozole 1 MG tablet Commonly known as: ARIMIDEX Take 1 tablet (1 mg total) by mouth daily.   Azilect 1 MG Tabs tablet Generic drug: rasagiline Take 1 tablet by mouth daily.   clopidogrel 75 MG tablet Commonly known as: PLAVIX Take 75  mg by mouth daily.   diclofenac sodium 1 % Gel Commonly known as: VOLTAREN Apply 2 g topically 4 (four) times daily.   diphenhydrAMINE 25 mg capsule Commonly known as: BENADRYL Take 25 mg by mouth every 6 (six) hours as needed (for parkinson's symptoms).   fluticasone 50 MCG/ACT nasal spray Commonly known as: FLONASE Place 2 sprays into both nostrils daily.   lisinopril 10 MG tablet Commonly known as: ZESTRIL Take 1 tablet by mouth once daily   Magnesium 250 MG Tabs Take 1 tablet by mouth daily.     meclizine 25 MG tablet Commonly known as: ANTIVERT TAKE 1 TABLET BY MOUTH THREE TIMES DAILY AS NEEDED FOR DIZZINESS   multivitamin capsule Take 1 capsule by mouth daily.   Myrbetriq 25 MG Tb24 tablet Generic drug: mirabegron ER TAKE 1 TABLET BY MOUTH ONCE DAILY   pramipexole 0.25 MG tablet Commonly known as: MIRAPEX Take 2 tablets by mouth 3 (three) times daily.   Risedronate Sodium 35 MG Tbec TAKE 1 TABLET BY MOUTH ONCE A WEEK   rosuvastatin 10 MG tablet Commonly known as: CRESTOR Take 1 tablet by mouth once daily   Vitamin D 50 MCG (2000 UT) Caps Take 1 capsule by mouth daily.           Allergies (Verified)    Patient has no known allergies.  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   Detrusor instability    Elevated cholesterol    GERD (gastroesophageal reflux disease)    Hot flashes    Humerus head fracture, left, with routine healing, subsequent encounter 2000   Hypertension    Melanoma ([place])    Mixed basal-squamous cell carcinoma    skin   Parkinson disease ([place])    Stroke ([place])    on Plavix, no deficits     Past Surgical History:  Procedure Laterality Date   BREAST LUMPECTOMY Left 04/2015   radiation   BREAST LUMPECTOMY WITH RADIOACTIVE SEED AND SENTINEL LYMPH NODE BIOPSY Left 04/25/2015   Procedure: LEFT BREAST PARTIAL MASTECTOMY WITH RADIOACTIVE SEED AND SENTINEL LYMPH NODE MAPPING;  Surgeon: Erroll Luna, MD;  Location: [facility];  Service: General;  Laterality: Left;   FOOT SURGERY  2004   KNEE SURGERY  2007   skin cancer excised     TONSILLECTOMY      Social History   Socioeconomic History   Marital status: Married    Spouse name: Not on file   Number of children: Not on file   Years of education: Not on file   Highest education level: Not on  file  Occupational History   Not on file  Social Needs   Financial resource strain: Not on file   Food insecurity    Worry: Not on file    Inability: Not on file   Transportation needs    Medical: Not on file    Non-medical: Not on file  Tobacco Use   Smoking status: Never Smoker   Smokeless tobacco: Never Used  Substance and Sexual Activity   Alcohol use: No    Alcohol/week: 0.0 standard drinks   Drug use: No   Sexual activity: Never    Birth control/protection: Post-menopausal    Comment: 1st intercourse 65 yo-2 partners  Lifestyle   Physical activity    Days per week: Not  on file    Minutes per session: Not on file   Stress: Not on file  Relationships   Social connections    Talks on phone: Not on file    Gets together: Not on file    Attends religious service: Not on file    Active member of club or organization: Not on file    Attends meetings of clubs or organizations: Not on file    Relationship status: Not on file  Other Topics Concern   Not on file  Social History Narrative   Not on file     Family History  Problem Relation Age of Onset   Hypertension Mother    Heart attack Mother    Heart attack Maternal Aunt    Heart attack Maternal Uncle    Breast cancer Neg Hx       Labs/Other Tests and Data Reviewed:    Wt Readings from Last 3 Encounters:  01/06/19 162 lb (73.5 kg)  10/04/18 158 lb (71.7 kg)  08/27/18 164 lb (74.4 kg)   Temp Readings from Last 3 Encounters:  01/06/19 98.4 F (36.9 C) (Oral)  10/04/18 (!) 96.7 F (35.9 C) (Oral)  08/27/18 98.9 F (37.2 C) (Oral)   BP Readings from Last 3 Encounters:  01/06/19 137/77  10/04/18 133/79  08/27/18 (!) 156/77   Pulse Readings from Last 3 Encounters:  01/06/19 89  10/04/18 87  08/27/18 (!) 50     No results found for: HGBA1C Lab Results  Component Value Date   LDLCALC 56 10/04/2018   CREATININE 0.81 10/04/2018       Chemistry      Component Value Date/Time    NA 140 10/04/2018 1218   NA 141 05/06/2017 1025   K 4.3 10/04/2018 1218   K 4.1 05/06/2017 1025   CL 101 10/04/2018 1218   CO2 22 10/04/2018 1218   CO2 28 05/06/2017 1025   BUN 11 10/04/2018 1218   BUN 14.3 05/06/2017 1025   CREATININE 0.81 10/04/2018 1218   CREATININE 0.9 05/06/2017 1025      Component Value Date/Time   CALCIUM 8.8 10/04/2018 1218   CALCIUM 10.0 05/06/2017 1025   ALKPHOS 51 10/04/2018 1218   ALKPHOS 79 05/06/2017 1025   AST 28 10/04/2018 1218   AST 21 05/06/2017 1025   ALT 24 10/04/2018 1218   ALT 15 05/06/2017 1025   BILITOT 0.2 10/04/2018 1218   BILITOT 0.65 05/06/2017 1025         OBSERVATIONS/ OBJECTIVE:     The patient was alert and responded appropriately to all questions asked of her.  Her weight is 158 pounds which is stable compared to past readings.  Her blood pressure was 127/75 this morning with a pulse of 73.  She does not check her temperature but has not been running 1.  She gets her eye exams done yearly in August.  As mentioned she is being followed regularly by the breast surgeon and breast oncologist because of her breast cancer and is currently seeing the neurologist and neurosurgeon because of upcoming surgery to do a stem cell transplant because of Parkinson's.  She will come to the office for fasting blood work.  Physical exam deferred due to nature of telephonic visit.  ASSESSMENT & PLAN    Time:   Today, I have spent 22 minutes with the patient via telephone discussing the above including Covid precautions.     Visit Diagnoses: 1. Essential hypertension -Blood pressures are good and  she will continue with watching her sodium intake and her current treatment regimen  2. Parkinson disease ([place]) -Continue to follow-up with neurology and neurosurgery for treatment and management and especially in regard to stem cell transplant this summer or in early September.  3. Postural kyphosis of thoracic region -The patient should  continue to be careful not put herself at risk for falling  4. Osteopenia of multiple sites -She should continue with calcium and vitamin D and regular DEXA scans at least every 2 years  5. Malignant melanoma, unspecified site [facility]) -Follow-up with dermatology as planned  6. Mixed basal-squamous cell carcinoma -Follow-up with dermatology as planned  7. Malignant neoplasm of upper-outer quadrant of left female breast, unspecified estrogen receptor status ([place]) -Follow-up with oncologist and breast surgeon  8. Pure hypercholesterolemia -Continue with aggressive therapeutic lifestyle changes including diet and exercise to achieve weight loss and statin therapy pending results of lab work  9. Vitamin D deficiency -Continue with vitamin D replacement pending results of lab work  32. Primary osteoarthritis of right ankle -Take Tylenol for aches pains and fever  11. Primary osteoarthritis involving multiple joints -Continue with Tylenol making all efforts to keep her self from falling  Patient Instructions  Continue to follow-up with neurology Continue to follow-up with breast cancer specialist Continue to practice good respiratory and hand hygiene because of coronavirus Drink plenty of water and fluids and stay well-hydrated Follow-up with orthopedics as needed       The above assessment and management plan was discussed with the patient. The patient verbalized understanding of and has agreed to the management plan. Patient is aware to call the clinic if symptoms persist or worsen. Patient is aware when to return to the clinic for a follow-up visit. Patient educated on when it is appropriate to go to the emergency department.    Chipper Herb, MD [place], [place], [place] [address] Ph [phone]   Arrie Senate MD

## 2019-02-07 ENCOUNTER — Other Ambulatory Visit: Payer: Self-pay

## 2019-02-07 ENCOUNTER — Other Ambulatory Visit: Payer: Medicare Other

## 2019-02-07 DIAGNOSIS — E559 Vitamin D deficiency, unspecified: Secondary | ICD-10-CM

## 2019-02-07 DIAGNOSIS — E7800 Pure hypercholesterolemia, unspecified: Secondary | ICD-10-CM | POA: Diagnosis not present

## 2019-02-07 DIAGNOSIS — M8589 Other specified disorders of bone density and structure, multiple sites: Secondary | ICD-10-CM | POA: Diagnosis not present

## 2019-02-07 DIAGNOSIS — C4499 Other specified malignant neoplasm of skin, unspecified: Secondary | ICD-10-CM

## 2019-02-07 DIAGNOSIS — I10 Essential (primary) hypertension: Secondary | ICD-10-CM

## 2019-02-07 DIAGNOSIS — C439 Malignant melanoma of skin, unspecified: Secondary | ICD-10-CM

## 2019-02-07 DIAGNOSIS — M19071 Primary osteoarthritis, right ankle and foot: Secondary | ICD-10-CM | POA: Diagnosis not present

## 2019-02-07 DIAGNOSIS — Z8673 Personal history of transient ischemic attack (TIA), and cerebral infarction without residual deficits: Secondary | ICD-10-CM

## 2019-02-07 DIAGNOSIS — M4004 Postural kyphosis, thoracic region: Secondary | ICD-10-CM

## 2019-02-07 DIAGNOSIS — M159 Polyosteoarthritis, unspecified: Secondary | ICD-10-CM

## 2019-02-07 DIAGNOSIS — C50412 Malignant neoplasm of upper-outer quadrant of left female breast: Secondary | ICD-10-CM | POA: Diagnosis not present

## 2019-02-07 DIAGNOSIS — M150 Primary generalized (osteo)arthritis: Secondary | ICD-10-CM | POA: Diagnosis not present

## 2019-02-07 DIAGNOSIS — G20 Parkinson's disease: Secondary | ICD-10-CM | POA: Diagnosis not present

## 2019-02-07 DIAGNOSIS — Z0000 Encounter for general adult medical examination without abnormal findings: Secondary | ICD-10-CM | POA: Diagnosis not present

## 2019-02-08 LAB — CBC WITH DIFFERENTIAL/PLATELET
Basophils Absolute: 0.1 10*3/uL (ref 0.0–0.2)
Basos: 1 %
EOS (ABSOLUTE): 0.2 10*3/uL (ref 0.0–0.4)
Eos: 5 %
Hematocrit: 40 % (ref 34.0–46.6)
Hemoglobin: 13.5 g/dL (ref 11.1–15.9)
Immature Grans (Abs): 0 10*3/uL (ref 0.0–0.1)
Immature Granulocytes: 0 %
Lymphocytes Absolute: 1.2 10*3/uL (ref 0.7–3.1)
Lymphs: 23 %
MCH: 30.3 pg (ref 26.6–33.0)
MCHC: 33.8 g/dL (ref 31.5–35.7)
MCV: 90 fL (ref 79–97)
Monocytes Absolute: 0.6 10*3/uL (ref 0.1–0.9)
Monocytes: 13 %
Neutrophils Absolute: 2.9 10*3/uL (ref 1.4–7.0)
Neutrophils: 58 %
Platelets: 218 10*3/uL (ref 150–450)
RBC: 4.46 x10E6/uL (ref 3.77–5.28)
RDW: 13 % (ref 11.7–15.4)
WBC: 5 10*3/uL (ref 3.4–10.8)

## 2019-02-08 LAB — HEPATIC FUNCTION PANEL
ALT: 6 IU/L (ref 0–32)
AST: 19 IU/L (ref 0–40)
Albumin: 4.4 g/dL (ref 3.7–4.7)
Alkaline Phosphatase: 67 IU/L (ref 39–117)
Bilirubin Total: 0.4 mg/dL (ref 0.0–1.2)
Bilirubin, Direct: 0.13 mg/dL (ref 0.00–0.40)
Total Protein: 6.3 g/dL (ref 6.0–8.5)

## 2019-02-08 LAB — BMP8+EGFR
BUN/Creatinine Ratio: 14 (ref 12–28)
BUN: 13 mg/dL (ref 8–27)
CO2: 23 mmol/L (ref 20–29)
Calcium: 9.3 mg/dL (ref 8.7–10.3)
Chloride: 104 mmol/L (ref 96–106)
Creatinine, Ser: 0.91 mg/dL (ref 0.57–1.00)
GFR calc Af Amer: 71 mL/min/{1.73_m2} (ref >59)
GFR calc non Af Amer: 61 mL/min/{1.73_m2} (ref >59)
Glucose: 95 mg/dL (ref 65–99)
Potassium: 4.2 mmol/L (ref 3.5–5.2)
Sodium: 142 mmol/L (ref 134–144)

## 2019-02-08 LAB — LIPID PANEL
Chol/HDL Ratio: 2.8 ratio (ref 0.0–4.4)
Cholesterol, Total: 177 mg/dL (ref 100–199)
HDL: 63 mg/dL (ref >39)
LDL Calculated: 90 mg/dL (ref 0–99)
Triglycerides: 122 mg/dL (ref 0–149)
VLDL Cholesterol Cal: 24 mg/dL (ref 5–40)

## 2019-02-08 LAB — THYROID PANEL WITH TSH
Free Thyroxine Index: 2.9 (ref 1.2–4.9)
T3 Uptake Ratio: 34 % (ref 24–39)
T4, Total: 8.6 ug/dL (ref 4.5–12.0)
TSH: 1.99 u[IU]/mL (ref 0.450–4.500)

## 2019-02-08 LAB — VITAMIN D 25 HYDROXY (VIT D DEFICIENCY, FRACTURES): Vit D, 25-Hydroxy: 61.9 ng/mL (ref 30.0–100.0)

## 2019-02-22 ENCOUNTER — Other Ambulatory Visit: Payer: Self-pay | Admitting: Hematology

## 2019-02-22 DIAGNOSIS — Z853 Personal history of malignant neoplasm of breast: Secondary | ICD-10-CM

## 2019-03-09 ENCOUNTER — Other Ambulatory Visit: Payer: Self-pay | Admitting: Family Medicine

## 2019-03-15 DIAGNOSIS — L821 Other seborrheic keratosis: Secondary | ICD-10-CM | POA: Diagnosis not present

## 2019-03-15 DIAGNOSIS — Z85828 Personal history of other malignant neoplasm of skin: Secondary | ICD-10-CM | POA: Diagnosis not present

## 2019-03-15 DIAGNOSIS — L570 Actinic keratosis: Secondary | ICD-10-CM | POA: Diagnosis not present

## 2019-04-12 ENCOUNTER — Encounter: Payer: Self-pay | Admitting: Family Medicine

## 2019-04-12 ENCOUNTER — Other Ambulatory Visit: Payer: Self-pay

## 2019-04-12 ENCOUNTER — Ambulatory Visit (INDEPENDENT_AMBULATORY_CARE_PROVIDER_SITE_OTHER): Payer: Medicare Other | Admitting: Family Medicine

## 2019-04-12 DIAGNOSIS — R42 Dizziness and giddiness: Secondary | ICD-10-CM | POA: Diagnosis not present

## 2019-04-12 DIAGNOSIS — H9312 Tinnitus, left ear: Secondary | ICD-10-CM

## 2019-04-12 MED ORDER — FLUTICASONE PROPIONATE 50 MCG/ACT NA SUSP: 2.0000 | Freq: Every day | NASAL | 6 refills | Status: DC

## 2019-04-12 MED ORDER — PREDNISONE 20 MG PO TABS: ORAL_TABLET | ORAL | 0 refills | Status: DC

## 2019-04-12 MED ORDER — MECLIZINE HCL 12.5 MG PO TABS: 12.5000 mg | ORAL_TABLET | Freq: Three times a day (TID) | ORAL | 0 refills | Status: DC | PRN

## 2019-04-12 NOTE — Progress Notes (Signed)
Virtual Visit via telephone Note Due to COVID-19 pandemic this visit was conducted virtually. This visit type was conducted due to national recommendations for restrictions regarding the COVID-19 Pandemic (e.g. social distancing, sheltering in place) in an effort to limit this patient's exposure and mitigate transmission in our community. All issues noted in this document were discussed and addressed.  A physical exam was not performed with this format.   I connected with Tracey Juarez on 04/12/19 at 1315 by telephone and verified that I am speaking with the correct person using two identifiers. Tracey Juarez is currently located at home and family is currently with them during visit. The provider, Monia Pouch, FNP is located in their office at time of visit.  I discussed the limitations, risks, security and privacy concerns of performing an evaluation and management service by telephone and the availability of in person appointments. I also discussed with the patient that there may be a patient responsible charge related to this service. The patient expressed understanding and agreed to proceed.  Subjective:  Patient ID: Tracey Juarez, female    DOB: 08/19/1940, 78 y.o.   MRN: [id number]  Chief Complaint:  No chief complaint on file.   HPI: Tracey Juarez is a 78 y.o. female presenting on 04/12/2019 for No chief complaint on file.   Pt reports ringing and pressure in her left ear. States at times she has dizziness with the tinnitus, only with positional changes. She denies fever, otalgia, weakness, headache, confusion, focal deficits, or syncope. No other associated symptoms. She reports she has had this in the past. She reports the dizziness only lasts for a few seconds and then goes away. States she has been taking Claritin with some relief of symptoms.     Relevant past medical, surgical, family, and social history reviewed and updated as indicated.  Allergies and medications  reviewed and updated.   Past Medical History:  Diagnosis Date  . Atrophic vaginitis   . Breast cancer in female [facility])   . 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 Detrusor instability   . Elevated cholesterol   . GERD (gastroesophageal reflux disease)   . Hot flashes   . Humerus head fracture, left, with routine healing, subsequent encounter 2000  . Hypertension   . Melanoma ([place])   . Mixed basal-squamous cell carcinoma    skin  . Parkinson disease ([place])   . Stroke [facility])    on Plavix, no deficits    Past Surgical History:  Procedure Laterality Date  . BREAST LUMPECTOMY Left 04/2015   radiation  . BREAST LUMPECTOMY WITH RADIOACTIVE SEED AND SENTINEL LYMPH NODE BIOPSY Left 04/25/2015   Procedure: LEFT BREAST PARTIAL MASTECTOMY WITH RADIOACTIVE SEED AND SENTINEL LYMPH NODE MAPPING;  Surgeon: Erroll Luna, MD;  Location: [facility];  Service: General;  Laterality: Left;  . FOOT SURGERY  2004  . KNEE SURGERY  2007  . skin cancer excised    . TONSILLECTOMY      Social History   Socioeconomic History  . Marital status: Married    Spouse name: Not on file  . Number of children: Not on file  . Years of education: Not on file  . Highest education level: Not on file  Occupational History  . Not on file  Social Needs  . Financial resource strain: Not on file  . Food insecurity    Worry: Not on file    Inability: Not  on file  . Transportation needs    Medical: Not on file    Non-medical: Not on file  Tobacco Use  . Smoking status: Never Smoker  . Smokeless tobacco: Never Used  Substance and Sexual Activity  . Alcohol use: No    Alcohol/week: 0.0 standard drinks  . Drug use: No  . Sexual activity: Never    Birth control/protection: Post-menopausal    Comment: 1st intercourse 12 yo-2 partners  Lifestyle  . Physical activity    Days per week: Not on file    Minutes per session: Not on file  . Stress: Not on file   Relationships  . Social Herbalist on phone: Not on file    Gets together: Not on file    Attends religious service: Not on file    Active member of club or organization: Not on file    Attends meetings of clubs or organizations: Not on file    Relationship status: Not on file  . Intimate partner violence    Fear of current or ex partner: Not on file    Emotionally abused: Not on file    Physically abused: Not on file    Forced sexual activity: Not on file  Other Topics Concern  . Not on file  Social History Narrative  . Not on file    Outpatient Encounter Medications as of 04/12/2019  Medication Sig  . anastrozole (ARIMIDEX) 1 MG tablet Take 1 tablet (1 mg total) by mouth daily.  . AZILECT 1 MG TABS tablet Take 1 tablet by mouth daily.  . Carbidopa-Levodopa ER (SINEMET CR) 25-100 MG tablet controlled release Take 1 tablet by mouth 3 (three) times daily.  . Cholecalciferol (VITAMIN D) 2000 UNITS CAPS Take 1 capsule by mouth daily.  . clopidogrel (PLAVIX) 75 MG tablet Take 75 mg by mouth daily.  . diclofenac sodium (VOLTAREN) 1 % GEL Apply 2 g topically 4 (four) times daily.  . diphenhydrAMINE (BENADRYL) 25 mg capsule Take 25 mg by mouth every 6 (six) hours as needed (for parkinson's symptoms).   . fluticasone (FLONASE) 50 MCG/ACT nasal spray Place 2 sprays into both nostrils daily.  Marland Kitchen lisinopril (PRINIVIL,ZESTRIL) 10 MG tablet Take 1 tablet by mouth once daily  . Magnesium 250 MG TABS Take 1 tablet by mouth daily.  . meclizine (ANTIVERT) 12.5 MG tablet Take 1 tablet (12.5 mg total) by mouth 3 (three) times daily as needed for dizziness.  . Multiple Vitamin (MULTIVITAMIN) capsule Take 1 capsule by mouth daily.    Marland Kitchen MYRBETRIQ 25 MG TB24 tablet TAKE 1 TABLET BY MOUTH ONCE DAILY  . pramipexole (MIRAPEX) 0.25 MG tablet Take 2 tablets by mouth 3 (three) times daily.   . predniSONE (DELTASONE) 20 MG tablet 2 po at sametime daily for 5 days  . Risedronate Sodium 35 MG TBEC Take 1  tablet by mouth once a week  . rosuvastatin (CRESTOR) 10 MG tablet Take 1 tablet by mouth once daily  . [DISCONTINUED] fluticasone (FLONASE) 50 MCG/ACT nasal spray Place 2 sprays into both nostrils daily.  . [DISCONTINUED] meclizine (ANTIVERT) 25 MG tablet TAKE 1 TABLET BY MOUTH THREE TIMES DAILY AS NEEDED FOR DIZZINESS   No facility-administered encounter medications on file as of 04/12/2019.     No Known Allergies  Review of Systems  Constitutional: Negative for activity change, appetite change, chills, diaphoresis, fatigue, fever and unexpected weight change.  HENT: Positive for tinnitus (left ear). Negative for congestion, ear discharge, ear pain,  postnasal drip, rhinorrhea, sinus pressure, sinus pain and sore throat.        Left ear pressure  Eyes: Negative for photophobia and visual disturbance.  Respiratory: Negative for cough, chest tightness, shortness of breath and wheezing.   Cardiovascular: Negative for chest pain, palpitations and leg swelling.  Gastrointestinal: Negative for abdominal pain, nausea and vomiting.  Genitourinary: Negative for decreased urine volume and difficulty urinating.  Musculoskeletal: Negative for arthralgias and myalgias.  Skin: Negative for color change and pallor.  Neurological: Positive for dizziness. Negative for tremors, seizures, syncope, facial asymmetry, speech difficulty, weakness, light-headedness, numbness and headaches.  Psychiatric/Behavioral: Negative for confusion.  All other systems reviewed and are negative.        Observations/Objective: No vital signs or physical exam, this was a telephone or virtual health encounter.  Pt alert and oriented, answers all questions appropriately, and able to speak in full sentences.    Assessment and Plan: Diagnoses and all orders for this visit:  Tinnitus of left ear Postural dizziness Reported symptoms consistent with left otitis media with effusion. Pt has tinnitus in left ear with  intermittent dizziness without neurological deficits. No red flags present or symptoms concerning for acute bacterial infection. Pt to continue Claritin and add below. Pt aware to report any new or worsening symptoms. Pt aware of symptoms that require emergent evaluation. Follow up in 2 weeks or sooner if needed.  -     fluticasone (FLONASE) 50 MCG/ACT nasal spray; Place 2 sprays into both nostrils daily. -     predniSONE (DELTASONE) 20 MG tablet; 2 po at sametime daily for 5 days -     meclizine (ANTIVERT) 12.5 MG tablet; Take 1 tablet (12.5 mg total) by mouth 3 (three) times daily as needed for dizziness.     Follow Up Instructions: Return in about 2 weeks (around 04/26/2019), or if symptoms worsen or fail to improve, for Dizziness, Tinnitus.    I discussed the assessment and treatment plan with the patient. The patient was provided an opportunity to ask questions and all were answered. The patient agreed with the plan and demonstrated an understanding of the instructions.   The patient was advised to call back or seek an in-person evaluation if the symptoms worsen or if the condition fails to improve as anticipated.  The above assessment and management plan was discussed with the patient. The patient verbalized understanding of and has agreed to the management plan. Patient is aware to call the clinic if symptoms persist or worsen. Patient is aware when to return to the clinic for a follow-up visit. Patient educated on when it is appropriate to go to the emergency department.    I provided 15 minutes of non-face-to-face time during this encounter. The call started at 1315. The call ended at 1330. The other time was used for coordination of care.    Monia Pouch, FNP-C [facility] [address] [place], [place] [address] [phone] 04/12/19

## 2019-04-14 ENCOUNTER — Other Ambulatory Visit: Payer: Self-pay | Admitting: Family Medicine

## 2019-04-14 DIAGNOSIS — H40053 Ocular hypertension, bilateral: Secondary | ICD-10-CM | POA: Diagnosis not present

## 2019-04-23 ENCOUNTER — Other Ambulatory Visit: Payer: Self-pay | Admitting: Family Medicine

## 2019-04-27 ENCOUNTER — Telehealth: Payer: Self-pay | Admitting: Family Medicine

## 2019-04-27 NOTE — Telephone Encounter (Signed)
Patient states that she is still having intermittent inner ear problems and would like to have a refill on meclizine.  Advised patient to wait a couple of weeks before coming in for flu shot

## 2019-04-28 ENCOUNTER — Other Ambulatory Visit: Payer: Self-pay

## 2019-04-28 ENCOUNTER — Other Ambulatory Visit: Payer: Self-pay | Admitting: Hematology

## 2019-04-28 ENCOUNTER — Ambulatory Visit
Admission: RE | Admit: 2019-04-28 | Discharge: 2019-04-28 | Disposition: A | Discharge status: Home / Self Care | Payer: Medicare Other | Source: Ambulatory Visit | Attending: Hematology | Admitting: Hematology

## 2019-04-28 ENCOUNTER — Other Ambulatory Visit: Payer: Self-pay | Admitting: Family Medicine

## 2019-04-28 DIAGNOSIS — H9312 Tinnitus, left ear: Secondary | ICD-10-CM

## 2019-04-28 DIAGNOSIS — R922 Inconclusive mammogram: Secondary | ICD-10-CM | POA: Diagnosis not present

## 2019-04-28 DIAGNOSIS — Z853 Personal history of malignant neoplasm of breast: Secondary | ICD-10-CM

## 2019-04-28 DIAGNOSIS — R42 Dizziness and giddiness: Secondary | ICD-10-CM

## 2019-04-29 ENCOUNTER — Telehealth: Payer: Self-pay | Admitting: Family Medicine

## 2019-04-29 NOTE — Telephone Encounter (Signed)
Patient wants to wait to have appt for December.

## 2019-04-29 NOTE — Telephone Encounter (Signed)
Yes it can be virtual looking at her history, it does not look like she is due for blood work, just have her check her blood pressure if she can the day before the day of so we have it before the visit.

## 2019-05-03 DIAGNOSIS — Z23 Encounter for immunization: Secondary | ICD-10-CM | POA: Diagnosis not present

## 2019-05-04 NOTE — Progress Notes (Signed)
[place]   Telephone:([phone] Fax:([phone]) [phone]   Clinic Follow up Note   Patient Care Team: Dettinger, Fransisca Kaufmann, MD as PCP - General (Family Medicine) Fontaine, Belinda Block, MD as Consulting Physician (Gynecology) Erroll Luna, MD as Consulting Physician (General Surgery) Truitt Merle, MD as Consulting Physician (Hematology) Thea Silversmith, MD as Consulting Physician (Radiation Oncology) Mauro Kaufmann, RN as Registered Nurse Rockwell Germany, RN as Registered Nurse Jake Shark, Johny Blamer, NP as Nurse Practitioner (Nurse Practitioner) Ellwood Dense., MD as Referring Physician (Neurology)  Date of Service:  05/06/2019  CHIEF COMPLAINT: Follow Up left breast cancer  SUMMARY OF ONCOLOGIC HISTORY: Oncology History Overview Note  Breast cancer of upper-outer quadrant of left female breast [facility])   Staging form: Breast, AJCC 7th Edition     Clinical stage from 03/19/2015: Stage IIA (T2, N0, M0) - Signed by Truitt Merle, MD on 03/27/2015     Pathologic stage from 04/27/2015: Stage IA (T1c, N0, cM0) - Signed by Enid Cutter, MD on 05/01/2015       Staging comments: Staged on final surgical specimen by Dr. Donato Heinz.        Breast cancer of upper-outer quadrant of left female breast ([place])  03/06/2015 Mammogram   Hypoechoic area in the left breast 1:00 position, 3 cm from the nipple, measuring 1.9 x 1.1 x 1.2 cm, corresponding to an area of architectural distortion on mammogram.   03/19/2015 Initial Biopsy   Left breast core needle biopsy showed invasive lobular carcinoma, grade 1-2, lobular carcinoma in situ. ER 100% positive, PR 50% positive, HER-2 negative, Ki-67 5%.   03/22/2015 Breast MRI   2.1 x 1.2 x 1.7 cm irregular enhancement within the upper outer left breast compatible with biopsy-proven malignancy. No other abnormalities.   03/22/2015 Clinical Stage   Stage IIA: T2 N0   04/25/2015 Definitive Surgery   Left breast lumpectomy / SLNB: invasive lobular carcinoma,  grade 1, margins were negative. LCIS; 3 sentinel lymph nodes were negative (0/3), HER2/neu repeated and negative   04/25/2015 Pathologic Stage   Stage IA: T1c N0   04/25/2015 Oncotype testing   Recurrence score 15, which predicts 10% 10 year risk of distant recurrence with tamoxifen alone.   06/06/2015 - 06/27/2015 Radiation Therapy   Adjuvant RT: Left breast 42.72 Gy over 16 fractions   08/12/2015 -  Anti-estrogen oral therapy   Anastrozole 1 mg daily.   09/20/2015 Survivorship   Survivorship care plan completed and mailed to patient in lieu of in person visit at pt request.   09/24/2016 Imaging   DEXA ASSESSMENT: The probability of a major osteoporotic fracture is 19.3% within the next ten years The probability of a hip fracture is 4.4% within the next ten years [facility] Radiology AP Spine L1-L3 09/24/2016 74.3 [age] Osteopenia -1.3 1.019 g/cm2 -12.2% * AP Spine L1-L3 11/30/2013 71[age]5 [age] Normal -0.2 1.161 g/cm2 6.0% * AP Spine L1-L3 01/29/2011 68.7 years Normal -0.7 1.095 g/cm2 -7.7% * AP Spine L1-L3 10/13/2005 63.4 years Normal 0.0 1.186 g/cm2 - DualFemur Neck Left 09/24/2016 74.3 [age] Osteopenia -2.0 0.762 g/cm2 -6.6% * DualFemur Neck Left 11/30/2013 71.5 years Osteopenia -1.6 0.816 g/cm2 2.5% DualFemur Neck Left 01/29/2011 68.7 years Osteopenia -1.7 0.796 g/cm2 -3.5% DualFemur Neck Left 10/13/2005 63.4 years Osteopenia -1.5 0.825 g/cm2 - The BMD measured at Femur Neck Left is 0.762 g/cm2 with a T-score of -2.0. This patient is considered osteopenic according to [facility]) criteria.    04/15/2017 Imaging   DIAG  breast TOMO bilateral FINDINGS: The left lumpectomy site is stable. Scarring in the medial left breast is unchanged for many years with an adjacent dystrophic calcification. No other suspicious findings or changes.  Mammographic images were processed with CAD.  IMPRESSION: No mammographic evidence of malignancy.   04/26/2018 Mammogram    04/26/2018 Mammogram IMPRESSION: 1. No mammographic evidence of malignancy in either breast. 2. Stable left breast post lumpectomy changes      CURRENT THERAPY:  anastrozole 1 mg once daily, started on 06/11/2016  INTERVAL HISTORY:  Tracey Juarez is here for a follow up left breast cancer She was last seen by me 1 year ago. She presents to the clinic alone. She notes she is doing well. She notes she plans to have deep brain surgery for her Parkinson's disease on 07/01/19 at [facility]. She feels her Parkinson's has gradually progressed over the last 10 years. She notes she is tolerating Anastrozole well with hot flashes, manageable. She notes having DEXA scan since 2015 at her PCP office and she is due.    REVIEW OF SYSTEMS:   Constitutional: Denies fevers, chills or abnormal weight loss (+) hot flashes, manageable  Eyes: Denies blurriness of vision Ears, nose, mouth, throat, and face: Denies mucositis or sore throat Respiratory: Denies cough, dyspnea or wheezes Cardiovascular: Denies palpitation, chest discomfort or lower extremity swelling Gastrointestinal:  Denies nausea, heartburn or change in bowel habits Skin: Denies abnormal skin rashes Lymphatics: Denies new lymphadenopathy or easy bruising Neurological:Denies numbness, tingling or new weaknesses (+) hand tremors from Parkinson's  Behavioral/Psych: Mood is stable, no new changes  All other systems were reviewed with the patient and are negative.  MEDICAL HISTORY:  Past Medical History:  Diagnosis Date  . Atrophic vaginitis   . Breast cancer in female [facility])   . Breast cancer of upper-outer quadrant of left female breast ([place]) 03/20/2015  . Detrusor instability   . Elevated cholesterol   . GERD (gastroesophageal reflux disease)   . Hot flashes   . Humerus head fracture, left, with routine healing, subsequent encounter 2000  . Hypertension   . Melanoma ([place])   . Mixed basal-squamous cell carcinoma    skin  . Parkinson  disease ([place])   . Stroke ([place])    on Plavix, no deficits    SURGICAL HISTORY: Past Surgical History:  Procedure Laterality Date  . BREAST LUMPECTOMY Left 04/2015   radiation  . BREAST LUMPECTOMY WITH RADIOACTIVE SEED AND SENTINEL LYMPH NODE BIOPSY Left 04/25/2015   Procedure: LEFT BREAST PARTIAL MASTECTOMY WITH RADIOACTIVE SEED AND SENTINEL LYMPH NODE MAPPING;  Surgeon: Erroll Luna, MD;  Location: [place];  Service: General;  Laterality: Left;  . FOOT SURGERY  2004  . KNEE SURGERY  2007  . skin cancer excised    . TONSILLECTOMY      I have reviewed the social history and family history with the patient and they are unchanged from previous note.  ALLERGIES:  has No Known Allergies.  MEDICATIONS:  Current Outpatient Medications  Medication Sig Dispense Refill  . anastrozole (ARIMIDEX) 1 MG tablet Take 1 tablet (1 mg total) by mouth daily. 90 tablet 3  . Carbidopa-Levodopa ER (SINEMET CR) 25-100 MG tablet controlled release Take 1 tablet by mouth 3 (three) times daily.    . cetirizine (ZYRTEC) 10 MG tablet Take 10 mg by mouth daily.    . Cholecalciferol (VITAMIN D3) 1.25 MG (50000 UT) TABS Take by mouth. Taking 4,000 ui daily    .  clopidogrel (PLAVIX) 75 MG tablet Take 75 mg by mouth daily.    . diphenhydrAMINE (BENADRYL) 25 mg capsule Take 25 mg by mouth every 6 (six) hours as needed (for parkinson's symptoms).     . famotidine (PEPCID) 10 MG tablet Take 10 mg by mouth daily.    . Ginger, Zingiber officinalis, (GINGER ROOT) 550 MG CAPS Take by mouth.    Marland Kitchen lisinopril (ZESTRIL) 10 MG tablet Take 1 tablet by mouth once daily 90 tablet 0  . Magnesium 250 MG TABS Take 1 tablet by mouth daily.    . Multiple Vitamin (MULTIVITAMIN) capsule Take 1 capsule by mouth daily.      Marland Kitchen MYRBETRIQ 25 MG TB24 tablet Take 1 tablet by mouth once daily 90 tablet 0  . pramipexole (MIRAPEX) 0.5 MG tablet Take 0.5 mg by mouth 3 (three) times daily.    . Psyllium (FIBER THERAPY) 25 %  POWD Take by mouth.    . rasagiline (AZILECT) 0.5 MG TABS tablet Take 0.5 mg by mouth daily.    . Risedronate Sodium 35 MG TBEC Take 1 tablet by mouth once a week 12 tablet 0  . rosuvastatin (CRESTOR) 10 MG tablet Take 1 tablet by mouth once daily 90 tablet 0  . pramipexole (MIRAPEX) 0.25 MG tablet Take 2 tablets by mouth 3 (three) times daily.      No current facility-administered medications for this visit.     PHYSICAL EXAMINATION: ECOG PERFORMANCE STATUS: 2 - Symptomatic, <50% confined to bed  Vitals:   05/06/19 1107  BP: (!) 141/53  Pulse: 78  Resp: 18  Temp: 98.7 F (37.1 C)  SpO2: 99%   Filed Weights   05/06/19 1107  Weight: 160 lb 3.2 oz (72.7 kg)    GENERAL:alert, no distress and comfortable SKIN: skin color, texture, turgor are normal, no rashes or significant lesions EYES: normal, Conjunctiva are pink and non-injected, sclera clear  NECK: supple, thyroid normal size, non-tender, without nodularity LYMPH:  no palpable lymphadenopathy in the cervical, axillary  LUNGS: clear to auscultation and percussion with normal breathing effort HEART: regular rate & rhythm and no murmurs and no lower extremity edema ABDOMEN:abdomen soft, non-tender and normal bowel sounds Musculoskeletal:no cyanosis of digits and no clubbing  NEURO: alert & oriented x 3 with fluent speech, no focal motor/sensory deficits BREAST: S/p left lumpectomy: surgical incision healed well. No palpable mass, nodules or adenopathy bilaterally. Breast exam benign.   LABORATORY DATA:  I have reviewed the data as listed CBC Latest Ref Rng & Units 05/06/2019 02/07/2019 10/04/2018  WBC 4.0 - 10.5 K/uL 5.2 5.0 4.5  Hemoglobin 12.0 - 15.0 g/dL 13.0 13.5 13.7  Hematocrit 36.0 - 46.0 % 39.8 40.0 40.6  Platelets 150 - 400 K/uL 233 218 222     CMP Latest Ref Rng & Units 05/06/2019 02/07/2019 10/04/2018  Glucose 70 - 99 mg/dL 96 95 91  BUN 8 - 23 mg/dL '14 13 11  ' Creatinine 0.44 - 1.00 mg/dL 0.81 0.91 0.81  Sodium  135 - 145 mmol/L 141 142 140  Potassium 3.5 - 5.1 mmol/L 4.0 4.2 4.3  Chloride 98 - 111 mmol/L 107 104 101  CO2 22 - 32 mmol/L '28 23 22  ' Calcium 8.9 - 10.3 mg/dL 9.2 9.3 8.8  Total Protein 6.5 - 8.1 g/dL 6.8 6.3 6.1  Total Bilirubin 0.3 - 1.2 mg/dL 0.4 0.4 0.2  Alkaline Phos 38 - 126 U/L 63 67 51  AST 15 - 41 U/L '22 19 28  ' ALT  0 - 44 U/L <'6 6 24      ' RADIOGRAPHIC STUDIES: I have personally reviewed the radiological images as listed and agreed with the findings in the report. No results found.   ASSESSMENT & PLAN:  Ahmari Garton is a 78 y.o. female with   1. Left breast invasive lobular carcinoma, pT1cN0M0, stage IA, ER/PR positive, HER-2 negative, Ki-67 5%, Oncotype low risk -She was diagnosed 03/2015. She is s/p left lumpectomy and adjuvant radiation.  -She had a complete surgical resection, margins were negative, sentinel lymph nodes were negative, she has low-grade stage I breast cancer, likely cured by surgery alone. -Her Oncotype DX test results show recurrence score is 15, which predicts 10% 10 year distant recurrence with tamoxifen alone. Adjuvant Chemotherapy was not recommended.  -She is currently on adjuvant anastrozole since 06/2016. She has been tolerating well with mild hot flash and leg cramps, improved lately. Will continue anastrozole for now, plan for total 5 years.  -we'll continue breast cancer surveillance with annual mammogram, self-exam, and routine follow-up with [place].  -She is clinically doing well. Lab reviewed, her CBC and CMP are within normal limits. Her physical exam and her 04/2019 mammogram were unremarkable. There is no clinical concern for recurrence. -Continue surveillance. Next Mammogram in 04/2020 -Continue Anastrozole  -f/u in 1 year    2. Osteopenia -She had a bone density scan done in April 2015, which showed osteopenia with T score of -1.9 -I encouraged her to continue vitamin D and calcium  -We previously discussed that anastrozole may  potentially make her osteopenia worse.  -She is due for DEXA with her PCP.   3. Parkinson disease, hypertension -She'll continue follow-up with her primary care physician -She is overall physically adequate to ambulate herself. She does have hand tremors.  -She plans to undergo Deep brain surgery with Dr. Salomon Fick at [facility] on 07/01/19    Plan  -She is clinically stable -continue anastrozole -Mammogram in 04/2020 -Lab and f/u in 1 year   No problem-specific Assessment & Plan notes found for this encounter.   Orders Placed This Encounter  Procedures  . MM DIAG BREAST TOMO BILATERAL    Standing Status:   Future    Standing Expiration Date:   05/05/2020    Order Specific Question:   Reason for Exam (SYMPTOM  OR DIAGNOSIS REQUIRED)    Answer:   screening    Order Specific Question:   Preferred imaging location?    Answer:   [facility]   All questions were answered. The patient knows to call the clinic with any problems, questions or concerns. No barriers to learning was detected. I spent 15 minutes counseling the patient face to face. The total time spent in the appointment was 20 minutes and more than 50% was on counseling and review of test results     Truitt Merle, MD 05/06/2019   I, Joslyn Devon, am acting as scribe for Truitt Merle, MD.   I have reviewed the above documentation for accuracy and completeness, and I agree with the above.

## 2019-05-06 ENCOUNTER — Encounter: Payer: Self-pay | Admitting: Hematology

## 2019-05-06 ENCOUNTER — Inpatient Hospital Stay: Payer: Medicare Other | Attending: Hematology

## 2019-05-06 ENCOUNTER — Inpatient Hospital Stay (HOSPITAL_BASED_OUTPATIENT_CLINIC_OR_DEPARTMENT_OTHER): Payer: Medicare Other | Admitting: Hematology

## 2019-05-06 ENCOUNTER — Telehealth: Payer: Self-pay | Admitting: Hematology

## 2019-05-06 ENCOUNTER — Other Ambulatory Visit: Payer: Self-pay

## 2019-05-06 VITALS — BP 141/53 | HR 78 | Temp 98.7°F | Resp 18 | Ht 62.0 in | Wt 160.2 lb

## 2019-05-06 DIAGNOSIS — G20 Parkinson's disease: Secondary | ICD-10-CM | POA: Diagnosis not present

## 2019-05-06 DIAGNOSIS — E7800 Pure hypercholesterolemia, unspecified: Secondary | ICD-10-CM | POA: Diagnosis not present

## 2019-05-06 DIAGNOSIS — I10 Essential (primary) hypertension: Secondary | ICD-10-CM | POA: Diagnosis not present

## 2019-05-06 DIAGNOSIS — Z79811 Long term (current) use of aromatase inhibitors: Secondary | ICD-10-CM | POA: Insufficient documentation

## 2019-05-06 DIAGNOSIS — Z8673 Personal history of transient ischemic attack (TIA), and cerebral infarction without residual deficits: Secondary | ICD-10-CM | POA: Diagnosis not present

## 2019-05-06 DIAGNOSIS — Z170 Estrogen receptor positive status [ER+]: Secondary | ICD-10-CM | POA: Insufficient documentation

## 2019-05-06 DIAGNOSIS — M8580 Other specified disorders of bone density and structure, unspecified site: Secondary | ICD-10-CM | POA: Diagnosis not present

## 2019-05-06 DIAGNOSIS — M8589 Other specified disorders of bone density and structure, multiple sites: Secondary | ICD-10-CM | POA: Diagnosis not present

## 2019-05-06 DIAGNOSIS — Z7902 Long term (current) use of antithrombotics/antiplatelets: Secondary | ICD-10-CM | POA: Insufficient documentation

## 2019-05-06 DIAGNOSIS — Z79899 Other long term (current) drug therapy: Secondary | ICD-10-CM | POA: Insufficient documentation

## 2019-05-06 DIAGNOSIS — Z923 Personal history of irradiation: Secondary | ICD-10-CM | POA: Diagnosis not present

## 2019-05-06 DIAGNOSIS — R232 Flushing: Secondary | ICD-10-CM | POA: Insufficient documentation

## 2019-05-06 DIAGNOSIS — Z85820 Personal history of malignant melanoma of skin: Secondary | ICD-10-CM | POA: Diagnosis not present

## 2019-05-06 DIAGNOSIS — C50412 Malignant neoplasm of upper-outer quadrant of left female breast: Secondary | ICD-10-CM

## 2019-05-06 LAB — CBC WITH DIFFERENTIAL/PLATELET
Abs Immature Granulocytes: 0.02 10*3/uL (ref 0.00–0.07)
Basophils Absolute: 0 10*3/uL (ref 0.0–0.1)
Basophils Relative: 1 %
Eosinophils Absolute: 0.2 10*3/uL (ref 0.0–0.5)
Eosinophils Relative: 4 %
HCT: 39.8 % (ref 36.0–46.0)
Hemoglobin: 13 g/dL (ref 12.0–15.0)
Immature Granulocytes: 0 %
Lymphocytes Relative: 25 %
Lymphs Abs: 1.3 10*3/uL (ref 0.7–4.0)
MCH: 30.7 pg (ref 26.0–34.0)
MCHC: 32.7 g/dL (ref 30.0–36.0)
MCV: 94.1 fL (ref 80.0–100.0)
Monocytes Absolute: 0.7 10*3/uL (ref 0.1–1.0)
Monocytes Relative: 14 %
Neutro Abs: 3 10*3/uL (ref 1.7–7.7)
Neutrophils Relative %: 56 %
Platelets: 233 10*3/uL (ref 150–400)
RBC: 4.23 MIL/uL (ref 3.87–5.11)
RDW: 13.2 % (ref 11.5–15.5)
WBC: 5.2 10*3/uL (ref 4.0–10.5)
nRBC: 0 % (ref 0.0–0.2)

## 2019-05-06 LAB — COMPREHENSIVE METABOLIC PANEL
ALT: <6 U/L (ref 0–44)
AST: 22 U/L (ref 15–41)
Albumin: 3.9 g/dL (ref 3.5–5.0)
Alkaline Phosphatase: 63 U/L (ref 38–126)
Anion gap: 6 (ref 5–15)
BUN: 14 mg/dL (ref 8–23)
CO2: 28 mmol/L (ref 22–32)
Calcium: 9.2 mg/dL (ref 8.9–10.3)
Chloride: 107 mmol/L (ref 98–111)
Creatinine, Ser: 0.81 mg/dL (ref 0.44–1.00)
GFR calc Af Amer: >60 mL/min (ref >60)
GFR calc non Af Amer: >60 mL/min (ref >60)
Glucose, Bld: 96 mg/dL (ref 70–99)
Potassium: 4 mmol/L (ref 3.5–5.1)
Sodium: 141 mmol/L (ref 135–145)
Total Bilirubin: 0.4 mg/dL (ref 0.3–1.2)
Total Protein: 6.8 g/dL (ref 6.5–8.1)

## 2019-05-06 MED ORDER — ANASTROZOLE 1 MG PO TABS: 1.0000 mg | ORAL_TABLET | Freq: Every day | ORAL | 3 refills | Status: DC

## 2019-05-06 NOTE — Telephone Encounter (Signed)
Scheduled appt per 9/25 los. ° °Printed calendar and avs. °

## 2019-05-19 ENCOUNTER — Encounter: Payer: Self-pay | Admitting: Gynecology

## 2019-06-02 DIAGNOSIS — G20 Parkinson's disease: Secondary | ICD-10-CM | POA: Diagnosis not present

## 2019-06-02 DIAGNOSIS — R490 Dysphonia: Secondary | ICD-10-CM | POA: Diagnosis not present

## 2019-06-02 DIAGNOSIS — Z20828 Contact with and (suspected) exposure to other viral communicable diseases: Secondary | ICD-10-CM | POA: Diagnosis not present

## 2019-06-10 ENCOUNTER — Ambulatory Visit: Payer: Medicare Other | Admitting: Family Medicine

## 2019-06-10 DIAGNOSIS — E785 Hyperlipidemia, unspecified: Secondary | ICD-10-CM | POA: Diagnosis present

## 2019-06-10 DIAGNOSIS — G20 Parkinson's disease: Secondary | ICD-10-CM | POA: Diagnosis not present

## 2019-06-13 ENCOUNTER — Ambulatory Visit: Payer: Medicare Other | Admitting: Family Medicine

## 2019-06-23 ENCOUNTER — Other Ambulatory Visit: Payer: Self-pay | Admitting: Family Medicine

## 2019-06-23 DIAGNOSIS — H9312 Tinnitus, left ear: Secondary | ICD-10-CM

## 2019-06-23 DIAGNOSIS — R42 Dizziness and giddiness: Secondary | ICD-10-CM

## 2019-06-24 DIAGNOSIS — Z01812 Encounter for preprocedural laboratory examination: Secondary | ICD-10-CM | POA: Diagnosis not present

## 2019-06-24 DIAGNOSIS — G20 Parkinson's disease: Secondary | ICD-10-CM | POA: Diagnosis not present

## 2019-06-24 DIAGNOSIS — Z20828 Contact with and (suspected) exposure to other viral communicable diseases: Secondary | ICD-10-CM | POA: Diagnosis not present

## 2019-06-25 ENCOUNTER — Other Ambulatory Visit: Payer: Self-pay | Admitting: Family Medicine

## 2019-06-25 DIAGNOSIS — H9312 Tinnitus, left ear: Secondary | ICD-10-CM

## 2019-06-25 DIAGNOSIS — R42 Dizziness and giddiness: Secondary | ICD-10-CM

## 2019-07-01 DIAGNOSIS — G20 Parkinson's disease: Secondary | ICD-10-CM | POA: Diagnosis not present

## 2019-07-11 ENCOUNTER — Other Ambulatory Visit: Payer: Self-pay | Admitting: Family Medicine

## 2019-07-14 DIAGNOSIS — Z4802 Encounter for removal of sutures: Secondary | ICD-10-CM | POA: Diagnosis not present

## 2019-07-14 DIAGNOSIS — Z4542 Encounter for adjustment and management of neuropacemaker (brain) (peripheral nerve) (spinal cord): Secondary | ICD-10-CM | POA: Diagnosis not present

## 2019-07-14 DIAGNOSIS — G20 Parkinson's disease: Secondary | ICD-10-CM | POA: Diagnosis not present

## 2019-07-14 DIAGNOSIS — Z48811 Encounter for surgical aftercare following surgery on the nervous system: Secondary | ICD-10-CM | POA: Diagnosis not present

## 2019-07-14 DIAGNOSIS — Z9682 Presence of neurostimulator: Secondary | ICD-10-CM | POA: Diagnosis not present

## 2019-07-14 DIAGNOSIS — Z9689 Presence of other specified functional implants: Secondary | ICD-10-CM | POA: Diagnosis not present

## 2019-07-19 ENCOUNTER — Ambulatory Visit (INDEPENDENT_AMBULATORY_CARE_PROVIDER_SITE_OTHER): Payer: Medicare Other | Admitting: Family Medicine

## 2019-07-19 ENCOUNTER — Encounter: Payer: Self-pay | Admitting: Family Medicine

## 2019-07-19 DIAGNOSIS — E7800 Pure hypercholesterolemia, unspecified: Secondary | ICD-10-CM

## 2019-07-19 DIAGNOSIS — N3281 Overactive bladder: Secondary | ICD-10-CM | POA: Diagnosis not present

## 2019-07-19 DIAGNOSIS — R42 Dizziness and giddiness: Secondary | ICD-10-CM | POA: Diagnosis not present

## 2019-07-19 DIAGNOSIS — I10 Essential (primary) hypertension: Secondary | ICD-10-CM

## 2019-07-19 DIAGNOSIS — H9312 Tinnitus, left ear: Secondary | ICD-10-CM | POA: Diagnosis not present

## 2019-07-19 MED ORDER — ROSUVASTATIN CALCIUM 10 MG PO TABS: 10.0000 mg | ORAL_TABLET | Freq: Every day | ORAL | 3 refills | Status: DC

## 2019-07-19 MED ORDER — OXYBUTYNIN CHLORIDE ER 5 MG PO TB24: 5.0000 mg | ORAL_TABLET | Freq: Every day | ORAL | 3 refills | Status: DC

## 2019-07-19 MED ORDER — MECLIZINE HCL 12.5 MG PO TABS: 12.5000 mg | ORAL_TABLET | Freq: Three times a day (TID) | ORAL | 1 refills | Status: DC | PRN

## 2019-07-19 MED ORDER — LISINOPRIL 10 MG PO TABS: 10.0000 mg | ORAL_TABLET | Freq: Every day | ORAL | 3 refills | Status: DC

## 2019-07-19 NOTE — Progress Notes (Signed)
Virtual Visit via telephone Note  I connected with Tracey Juarez on 07/19/19 at 1030 by telephone and verified that I am speaking with the correct person using two identifiers. Tracey Juarez is currently located at home and no other people are currently with her during visit. The provider, Fransisca Kaufmann Princessa Lesmeister, MD is located in their office at time of visit.  Call ended at 1041  I discussed the limitations, risks, security and privacy concerns of performing an evaluation and management service by telephone and the availability of in person appointments. I also discussed with the patient that there may be a patient responsible charge related to this service. The patient expressed understanding and agreed to proceed.   History and Present Illness: Hypertension Patient is currently on lisinopril, and their blood pressure today is unknown. Patient denies any lightheadedness or dizziness. Patient denies headaches, blurred vision, chest pains, shortness of breath, or weakness. Denies any side effects from medication and is content with current medication.   Hyperlipidemia Patient is coming in for recheck of his hyperlipidemia. The patient is currently taking crestor. They deny any issues with myalgias or history of liver damage from it. They deny any focal numbness or weakness or chest pain.   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  She says the expense has been a couple $100 a month sometimes and that is why she wants to switch.  Patient did just have deep brain stimulation for Parkinson's and feels like her tremors are doing a lot better.  No diagnosis found.  Outpatient Encounter Medications as of 07/19/2019  Medication Sig   anastrozole (ARIMIDEX) 1 MG tablet Take 1 tablet (1 mg total) by mouth daily.   Carbidopa-Levodopa ER (SINEMET CR) 25-100 MG tablet controlled release Take 1 tablet by mouth 3 (three)  times daily.   cetirizine (ZYRTEC) 10 MG tablet Take 10 mg by mouth daily.   Cholecalciferol (VITAMIN D3) 1.25 MG (50000 UT) TABS Take by mouth. Taking 4,000 ui daily   clopidogrel (PLAVIX) 75 MG tablet Take 75 mg by mouth daily.   diphenhydrAMINE (BENADRYL) 25 mg capsule Take 25 mg by mouth every 6 (six) hours as needed (for parkinson's symptoms).    famotidine (PEPCID) 10 MG tablet Take 10 mg by mouth daily.   Ginger, Zingiber officinalis, (GINGER ROOT) 550 MG CAPS Take by mouth.   lisinopril (ZESTRIL) 10 MG tablet Take 1 tablet by mouth once daily   Magnesium 250 MG TABS Take 1 tablet by mouth daily.   meclizine (ANTIVERT) 12.5 MG tablet TAKE 1 TABLET BY MOUTH THREE TIMES DAILY AS NEEDED FOR DIZZINESS   Multiple Vitamin (MULTIVITAMIN) capsule Take 1 capsule by mouth daily.     MYRBETRIQ 25 MG TB24 tablet Take 1 tablet by mouth once daily   pramipexole (MIRAPEX) 0.25 MG tablet Take 2 tablets by mouth 3 (three) times daily.    pramipexole (MIRAPEX) 0.5 MG tablet Take 0.5 mg by mouth 3 (three) times daily.   Psyllium (FIBER THERAPY) 25 % POWD Take by mouth.   rasagiline (AZILECT) 0.5 MG TABS tablet Take 0.5 mg by mouth daily.   Risedronate Sodium 35 MG TBEC Take 1 tablet by mouth once a week   rosuvastatin (CRESTOR) 10 MG tablet Take 1 tablet by mouth once daily   No facility-administered encounter medications on file as of 07/19/2019.     Review of Systems  Constitutional: Negative for chills  and fever.  Eyes: Negative for visual disturbance.  Respiratory: Negative for chest tightness and shortness of breath.   Cardiovascular: Negative for chest pain and leg swelling.  Musculoskeletal: Negative for back pain and gait problem.  Skin: Negative for rash.  Neurological: Positive for dizziness (Patient has vertigo and takes meclizine for it). Negative for light-headedness and headaches.  Psychiatric/Behavioral: Negative for agitation and behavioral problems.  All other  systems reviewed and are negative.   Observations/Objective: Patient sounds comfortable and in no acute distress  Assessment and Plan: Problem List Items Addressed This Visit      Cardiovascular and Mediastinum   Hypertension   Relevant Medications   lisinopril (ZESTRIL) 10 MG tablet   rosuvastatin (CRESTOR) 10 MG tablet     Genitourinary   Detrusor instability   Relevant Medications   oxybutynin (DITROPAN-XL) 5 MG 24 hr tablet     Other   Pure hypercholesterolemia - Primary   Relevant Medications   lisinopril (ZESTRIL) 10 MG tablet   rosuvastatin (CRESTOR) 10 MG tablet    Other Visit Diagnoses    Tinnitus of left ear       Relevant Medications   meclizine (ANTIVERT) 12.5 MG tablet   Postural dizziness       Relevant Medications   meclizine (ANTIVERT) 12.5 MG tablet      No change in current medication except that we change the Myrbetriq to oxybutynin, warned of side effects and she will take it and see how it goes. Follow Up Instructions: In 4 months hypertension and cholesterol    I discussed the assessment and treatment plan with the patient. The patient was provided an opportunity to ask questions and all were answered. The patient agreed with the plan and demonstrated an understanding of the instructions.   The patient was advised to call back or seek an in-person evaluation if the symptoms worsen or if the condition fails to improve as anticipated.  The above assessment and management plan was discussed with the patient. The patient verbalized understanding of and has agreed to the management plan. Patient is aware to call the clinic if symptoms persist or worsen. Patient is aware when to return to the clinic for a follow-up visit. Patient educated on when it is appropriate to go to the emergency department.    I provided 11 minutes of non-face-to-face time during this encounter.    Worthy Rancher, MD

## 2019-10-31 ENCOUNTER — Telehealth: Payer: Self-pay | Admitting: Family Medicine

## 2019-10-31 NOTE — Chronic Care Management (AMB) (Signed)
  Chronic Care Management   Note  10/31/2019 Name: Jaaliyah Lucatero MRN: [id number] DOB: [birth year]  Fanny Bien Lusty is a 79 y.o. year old female who is a primary care patient of Dettinger, Fransisca Kaufmann, MD. I reached out to [employer] by phone today in response to a referral sent by Ms. Fanny Bien Deguire's health plan.     Ms. Kowalchuk was given information about Chronic Care Management services today including:  1. CCM service includes personalized support from designated clinical staff supervised by her physician, including individualized plan of care and coordination with other care providers 2. 24/7 contact phone numbers for assistance for urgent and routine care needs. 3. Service will only be billed when office clinical staff spend 20 minutes or more in a month to coordinate care. 4. Only one practitioner may furnish and bill the service in a calendar month. 5. The patient may stop CCM services at any time (effective at the end of the month) by phone call to the office staff. 6. The patient will be responsible for cost sharing (co-pay) of up to 20% of the service fee (after annual deductible is met).  Patient agreed to services and verbal consent obtained.   Follow up plan: Telephone appointment with care management team member scheduled for:03/23/2020.  [place], [place] [id number] Direct Dial: [phone] [email or web address]: [email or web address]

## 2019-11-17 ENCOUNTER — Ambulatory Visit (INDEPENDENT_AMBULATORY_CARE_PROVIDER_SITE_OTHER): Payer: Medicare Other | Admitting: Family Medicine

## 2019-11-17 ENCOUNTER — Encounter: Payer: Self-pay | Admitting: Family Medicine

## 2019-11-17 ENCOUNTER — Other Ambulatory Visit: Payer: Self-pay

## 2019-11-17 VITALS — BP 140/64 | HR 78 | Temp 98.7°F | Ht 62.0 in | Wt 159.1 lb

## 2019-11-17 DIAGNOSIS — E7800 Pure hypercholesterolemia, unspecified: Secondary | ICD-10-CM | POA: Diagnosis not present

## 2019-11-17 DIAGNOSIS — G20 Parkinson's disease: Secondary | ICD-10-CM | POA: Diagnosis not present

## 2019-11-17 DIAGNOSIS — I10 Essential (primary) hypertension: Secondary | ICD-10-CM

## 2019-11-17 NOTE — Progress Notes (Signed)
BP 140/64   Pulse 78   Temp 98.7 F (37.1 C)   Ht '5\' 2"'  (1.575 m)   Wt 159 lb 2 oz (72.2 kg)   SpO2 100%   BMI 29.10 kg/m    Subjective:   Patient ID: Tracey Juarez, female    DOB: 01-30-1941, 79 y.o.   MRN: [id number]  HPI: Tracey Juarez is a 79 y.o. female presenting on 11/17/2019 for Medical Management of Chronic Issues (20m and Hyperlipidemia   HPI Hypertension Patient is currently on lisinopril, and their blood pressure today is 140/64. Patient denies any lightheadedness or dizziness. Patient denies headaches, blurred vision, chest pains, shortness of breath, or weakness. Denies any side effects from medication and is content with current medication.   Hyperlipidemia Patient is coming in for recheck of his hyperlipidemia. The patient is currently taking Crestor. They deny any issues with myalgias or history of liver damage from it. They deny any focal numbness or weakness or chest pain.   Patient has Parkinson's and sees neurology and has neurostimulator down to doing a lot better so get around more.  She has little arthritis but otherwise she feels like she is doing pretty good with life.  Relevant past medical, surgical, family and social history reviewed and updated as indicated. Interim medical history since our last visit reviewed. Allergies and medications reviewed and updated.  Review of Systems  Constitutional: Negative for chills and fever.  Eyes: Negative for visual disturbance.  Respiratory: Negative for chest tightness and shortness of breath.   Cardiovascular: Negative for chest pain and leg swelling.  Musculoskeletal: Negative for back pain and gait problem.  Skin: Negative for rash.  Neurological: Negative for tremors, light-headedness and headaches.  Psychiatric/Behavioral: Negative for agitation and behavioral problems.  All other systems reviewed and are negative.   Per HPI unless specifically indicated above   Allergies as of 11/17/2019    No Known Allergies     Medication List       Accurate as of November 17, 2019  9:06 AM. If you have any questions, ask your nurse or doctor.        STOP taking these medications   diphenhydrAMINE 25 mg capsule Commonly known as: BENADRYL Stopped by: JWorthy Rancher MD   Myrbetriq 25 MG Tb24 tablet Generic drug: mirabegron ER Stopped by: JFransisca KaufmannDettinger, MD     TAKE these medications   acetaminophen 325 MG tablet Commonly known as: TYLENOL Take 650 mg by mouth every 6 (six) hours as needed.   anastrozole 1 MG tablet Commonly known as: ARIMIDEX Take 1 tablet (1 mg total) by mouth daily.   Carbidopa-Levodopa ER 25-100 MG tablet controlled release Commonly known as: SINEMET CR Take 1 tablet by mouth 3 (three) times daily.   cetirizine 10 MG tablet Commonly known as: ZYRTEC Take 10 mg by mouth daily.   clopidogrel 75 MG tablet Commonly known as: PLAVIX Take 75 mg by mouth daily.   famotidine 10 MG tablet Commonly known as: PEPCID Take 10 mg by mouth daily.   Fiber Therapy 25 % Powd Generic drug: Psyllium Take by mouth.   Ginger Root 550 MG Caps Take by mouth.   lisinopril 10 MG tablet Commonly known as: ZESTRIL Take 1 tablet (10 mg total) by mouth daily.   Magnesium 250 MG Tabs Take 1 tablet by mouth daily.   meclizine 12.5 MG tablet Commonly known as: ANTIVERT Take 1-2 tablets (12.5-25 mg total) by mouth 3 (three) times  daily as needed for dizziness.   multivitamin capsule Take 1 capsule by mouth daily.   oxybutynin 5 MG 24 hr tablet Commonly known as: DITROPAN-XL Take 1 tablet (5 mg total) by mouth at bedtime.   pramipexole 0.5 MG tablet Commonly known as: MIRAPEX Take 0.5 mg by mouth 3 (three) times daily.   pramipexole 0.25 MG tablet Commonly known as: MIRAPEX Take 2 tablets by mouth 3 (three) times daily.   rasagiline 0.5 MG Tabs tablet Commonly known as: AZILECT Take 0.5 mg by mouth daily.   Risedronate Sodium 35 MG Tbec Take 1  tablet by mouth once a week   rosuvastatin 10 MG tablet Commonly known as: CRESTOR Take 1 tablet (10 mg total) by mouth daily.   Vitamin D3 1.25 MG (50000 UT) Tabs Take by mouth. Taking 4,000 ui daily        Objective:   BP 140/64   Pulse 78   Temp 98.7 F (37.1 C)   Ht '5\' 2"'  (1.575 m)   Wt 159 lb 2 oz (72.2 kg)   SpO2 100%   BMI 29.10 kg/m   Wt Readings from Last 3 Encounters:  11/17/19 159 lb 2 oz (72.2 kg)  05/06/19 160 lb 3.2 oz (72.7 kg)  01/06/19 162 lb (73.5 kg)    Physical Exam Vitals and nursing note reviewed.  Constitutional:      General: She is not in acute distress.    Appearance: She is well-developed. She is not diaphoretic.  Eyes:     Conjunctiva/sclera: Conjunctivae normal.  Cardiovascular:     Rate and Rhythm: Normal rate and regular rhythm.     Heart sounds: Normal heart sounds. No murmur.  Pulmonary:     Effort: Pulmonary effort is normal. No respiratory distress.     Breath sounds: Normal breath sounds. No wheezing.  Musculoskeletal:        General: No tenderness. Normal range of motion.  Skin:    General: Skin is warm and dry.     Findings: No rash.  Neurological:     Mental Status: She is alert and oriented to person, place, and time.     Coordination: Coordination normal.  Psychiatric:        Behavior: Behavior normal.       Assessment & Plan:   Problem List Items Addressed This Visit      Cardiovascular and Mediastinum   Hypertension   Relevant Orders   CBC with Differential/Platelet   CMP14+EGFR   Lipid panel     Nervous and Auditory   Parkinson disease ([place])     Other   Pure hypercholesterolemia - Primary   Relevant Orders   CBC with Differential/Platelet   CMP14+EGFR   Lipid panel      Continue current medication, no changes, seems like she is doing well, will check blood work today. Follow up plan: Return in about 4 months (around 03/18/2020), or if symptoms worsen or fail to improve, for Hypertension and  hyperlipidemia.  Counseling provided for all of the vaccine components Orders Placed This Encounter  Procedures  . CBC with Differential/Platelet  . CMP14+EGFR  . Lipid panel    Caryl Pina, MD [facility] 11/17/2019, 9:06 AM

## 2019-11-18 LAB — LIPID PANEL
Chol/HDL Ratio: 3 ratio (ref 0.0–4.4)
Cholesterol, Total: 164 mg/dL (ref 100–199)
HDL: 55 mg/dL (ref >39)
LDL Chol Calc (NIH): 89 mg/dL (ref 0–99)
Triglycerides: 109 mg/dL (ref 0–149)
VLDL Cholesterol Cal: 20 mg/dL (ref 5–40)

## 2019-11-18 LAB — CBC WITH DIFFERENTIAL/PLATELET
Basophils Absolute: 0 10*3/uL (ref 0.0–0.2)
Basos: 1 %
EOS (ABSOLUTE): 0.2 10*3/uL (ref 0.0–0.4)
Eos: 5 %
Hematocrit: 40.9 % (ref 34.0–46.6)
Hemoglobin: 13.1 g/dL (ref 11.1–15.9)
Immature Grans (Abs): 0 10*3/uL (ref 0.0–0.1)
Immature Granulocytes: 0 %
Lymphocytes Absolute: 1.2 10*3/uL (ref 0.7–3.1)
Lymphs: 25 %
MCH: 29.4 pg (ref 26.6–33.0)
MCHC: 32 g/dL (ref 31.5–35.7)
MCV: 92 fL (ref 79–97)
Monocytes Absolute: 0.8 10*3/uL (ref 0.1–0.9)
Monocytes: 17 %
Neutrophils Absolute: 2.6 10*3/uL (ref 1.4–7.0)
Neutrophils: 52 %
Platelets: 226 10*3/uL (ref 150–450)
RBC: 4.45 x10E6/uL (ref 3.77–5.28)
RDW: 13.1 % (ref 11.7–15.4)
WBC: 4.9 10*3/uL (ref 3.4–10.8)

## 2019-11-18 LAB — CMP14+EGFR
ALT: 4 IU/L (ref 0–32)
AST: 20 IU/L (ref 0–40)
Albumin/Globulin Ratio: 2.5 — ABNORMAL HIGH (ref 1.2–2.2)
Albumin: 4.5 g/dL (ref 3.7–4.7)
Alkaline Phosphatase: 69 IU/L (ref 39–117)
BUN/Creatinine Ratio: 16 (ref 12–28)
BUN: 14 mg/dL (ref 8–27)
Bilirubin Total: 0.4 mg/dL (ref 0.0–1.2)
CO2: 23 mmol/L (ref 20–29)
Calcium: 9.6 mg/dL (ref 8.7–10.3)
Chloride: 102 mmol/L (ref 96–106)
Creatinine, Ser: 0.85 mg/dL (ref 0.57–1.00)
GFR calc Af Amer: 76 mL/min/{1.73_m2} (ref >59)
GFR calc non Af Amer: 66 mL/min/{1.73_m2} (ref >59)
Globulin, Total: 1.8 g/dL (ref 1.5–4.5)
Glucose: 92 mg/dL (ref 65–99)
Potassium: 4.4 mmol/L (ref 3.5–5.2)
Sodium: 140 mmol/L (ref 134–144)
Total Protein: 6.3 g/dL (ref 6.0–8.5)

## 2019-11-24 ENCOUNTER — Ambulatory Visit (INDEPENDENT_AMBULATORY_CARE_PROVIDER_SITE_OTHER): Payer: Medicare Other | Admitting: *Deleted

## 2019-11-24 VITALS — Ht 62.0 in | Wt 159.2 lb

## 2019-11-24 DIAGNOSIS — Z0000 Encounter for general adult medical examination without abnormal findings: Secondary | ICD-10-CM

## 2019-11-24 NOTE — Progress Notes (Signed)
MEDICARE ANNUAL WELLNESS VISIT  11/24/2019  Telephone Visit Disclaimer This Medicare AWV was conducted by telephone due to national recommendations for restrictions regarding the COVID-19 Pandemic (e.g. social distancing).  I verified, using two identifiers, that I am speaking with Tracey Juarez or their authorized healthcare agent. I discussed the limitations, risks, security, and privacy concerns of performing an evaluation and management service by telephone and the potential availability of an in-person appointment in the future. The patient expressed understanding and agreed to proceed.   Subjective:  Tracey Juarez is a 79 y.o. female patient of Dettinger, Fransisca Kaufmann, MD who had a Medicare Annual Wellness Visit today via telephone. Tracey Juarez is Retired and lives with their spouse. she has 0 children. she reports that she is socially active and does interact with friends/family regularly. she is minimally physically active and enjoys gardening.  Patient Care Team: Dettinger, Fransisca Kaufmann, MD as PCP - General (Family Medicine) Fontaine, Belinda Block, MD as Consulting Physician (Gynecology) Erroll Luna, MD as Consulting Physician (General Surgery) Truitt Merle, MD as Consulting Physician (Hematology) Thea Silversmith, MD as Consulting Physician (Radiation Oncology) Mauro Kaufmann, RN as Registered Nurse Rockwell Germany, RN as Registered Nurse Jake Shark, Johny Blamer, NP as Nurse Practitioner (Nurse Practitioner) Ellwood Dense., MD as Referring Physician (Neurology) Ilean China, RN as Registered Nurse  Advanced Directives 11/24/2019 05/06/2017 09/18/2016 04/30/2016 12/26/2015 10/01/2015 07/16/2015  Does Patient Have a Medical Advance Directive? Yes Yes Yes Yes Yes Yes Yes  Type of Advance Directive Living will;Healthcare Power of [place];Living will [place];Living will [place];Living will -  Does patient  want to make changes to medical advance directive? No - Patient declined - - - - - -  Copy of [place] in Chart? No - copy requested - - No - copy requested No - copy requested No - copy requested No - copy requested    Hospital Utilization Over the Past 12 Months: # of hospitalizations or ER visits: 1 # of surgeries: 1  Review of Systems    Patient reports that her overall health is unchanged compared to last year.  History obtained from chart review and the patient General ROS: negative  Patient Reported Readings (BP, Pulse, CBG, Weight, etc) none  Pain Assessment Pain : No/denies pain     Current Medications & Allergies (verified) Allergies as of 11/24/2019      Reactions   Ondansetron Swelling, Palpitations      Medication List       Accurate as of November 24, 2019 11:04 AM. If you have any questions, ask your nurse or doctor.        STOP taking these medications   rasagiline 0.5 MG Tabs tablet Commonly known as: AZILECT     TAKE these medications   acetaminophen 325 MG tablet Commonly known as: TYLENOL Take 650 mg by mouth every 6 (six) hours as needed.   anastrozole 1 MG tablet Commonly known as: ARIMIDEX Take 1 tablet (1 mg total) by mouth daily.   Carbidopa-Levodopa ER 25-100 MG tablet controlled release Commonly known as: SINEMET CR Take 1 tablet by mouth 3 (three) times daily.   cetirizine 10 MG tablet Commonly known as: ZYRTEC Take 10 mg by mouth daily.   clopidogrel 75 MG tablet Commonly known as: PLAVIX Take 75 mg by mouth daily.   famotidine 10 MG tablet Commonly known as: PEPCID Take 10 mg  by mouth daily.   Fiber Therapy 25 % Powd Generic drug: Psyllium Take by mouth.   Ginger Root 550 MG Caps Take by mouth.   lisinopril 10 MG tablet Commonly known as: ZESTRIL Take 1 tablet (10 mg total) by mouth daily.   Magnesium 250 MG Tabs Take 1 tablet by mouth daily.   meclizine 12.5 MG tablet Commonly known as:  ANTIVERT Take 1-2 tablets (12.5-25 mg total) by mouth 3 (three) times daily as needed for dizziness.   multivitamin capsule Take 1 capsule by mouth daily.   oxybutynin 5 MG 24 hr tablet Commonly known as: DITROPAN-XL Take 1 tablet (5 mg total) by mouth at bedtime.   pramipexole 0.5 MG tablet Commonly known as: MIRAPEX Take 0.5 mg by mouth 3 (three) times daily.   pramipexole 0.25 MG tablet Commonly known as: MIRAPEX Take 2 tablets by mouth 3 (three) times daily.   Risedronate Sodium 35 MG Tbec Take 1 tablet by mouth once a week   rosuvastatin 10 MG tablet Commonly known as: CRESTOR Take 1 tablet (10 mg total) by mouth daily.   Vitamin D3 1.25 MG (50000 UT) Tabs Take by mouth. Taking 4,000 ui daily       History (reviewed): Past Medical History:  Diagnosis Date  . Atrophic vaginitis   . Breast cancer in female [facility])   . 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 Detrusor instability   . Elevated cholesterol   . GERD (gastroesophageal reflux disease)   . Hot flashes   . Humerus head fracture, left, with routine healing, subsequent encounter 2000  . Hypertension   . Melanoma ([place])   . Mixed basal-squamous cell carcinoma    skin  . Parkinson disease ([place])   . Stroke [facility])    on Plavix, no deficits   Past Surgical History:  Procedure Laterality Date  . BRAIN SURGERY    . BREAST LUMPECTOMY Left 04/2015   radiation  . BREAST LUMPECTOMY WITH RADIOACTIVE SEED AND SENTINEL LYMPH NODE BIOPSY Left 04/25/2015   Procedure: LEFT BREAST PARTIAL MASTECTOMY WITH RADIOACTIVE SEED AND SENTINEL LYMPH NODE MAPPING;  Surgeon: Erroll Luna, MD;  Location: [place];  Service: General;  Laterality: Left;  . FOOT SURGERY  2004  . KNEE SURGERY  2007  . skin cancer excised    . TONSILLECTOMY     Family History  Problem Relation Age of Onset  . Hypertension Mother   . Heart attack Mother   . Heart attack Maternal Aunt   . Heart  attack Maternal Uncle   . Breast cancer Neg Hx    Social History   Socioeconomic History  . Marital status: Married    Spouse name: Joneen Boers   . Number of children: 0  . Years of education: ged  . Highest education level: GED or equivalent  Occupational History  . Occupation: Retired  Tobacco Use  . Smoking status: Never Smoker  . Smokeless tobacco: Never Used  Substance and Sexual Activity  . Alcohol use: No    Alcohol/week: 0.0 standard drinks  . Drug use: No  . Sexual activity: Never    Birth control/protection: Post-menopausal    Comment: 1st intercourse 33 yo-2 partners  Other Topics Concern  . Not on file  Social History Narrative  . Not on file   Social Determinants of Health   Financial Resource Strain: Low Risk   . Difficulty of Paying Living Expenses: Not hard at all  Food  Insecurity: No Food Insecurity  . Worried About Charity fundraiser in the Last Year: Never true  . Ran Out of Food in the Last Year: Never true  Transportation Needs: No Transportation Needs  . Lack of Transportation (Medical): No  . Lack of Transportation (Non-Medical): No  Physical Activity: Inactive  . Days of Exercise per Week: 0 days  . Minutes of Exercise per Session: 0 min  Stress: No Stress Concern Present  . Feeling of Stress : Not at all  Social Connections: Not Isolated  . Frequency of Communication with Friends and Family: More than three times a week  . Frequency of Social Gatherings with Friends and Family: Once a week  . Attends Religious Services: More than 4 times per year  . Active Member of Clubs or Organizations: Yes  . Attends Archivist Meetings: More than 4 times per year  . Marital Status: Married    Activities of Daily Living In your present state of health, do you have any difficulty performing the following activities: 11/24/2019  Hearing? N  Vision? N  Difficulty concentrating or making decisions? N  Walking or climbing stairs? N  Dressing or  bathing? N  Doing errands, shopping? N  Preparing Food and eating ? N  Using the Toilet? N  In the past six months, have you accidently leaked urine? N  Do you have problems with loss of bowel control? N  Managing your Medications? N  Managing your Finances? N  Housekeeping or managing your Housekeeping? N  Some recent data might be hidden    Patient Education/ Literacy How often do you need to have someone help you when you read instructions, pamphlets, or other written materials from your doctor or pharmacy?: 1 - Never What is the last grade level you completed in school?: GED  Exercise Current Exercise Habits: The patient does not participate in regular exercise at present, Exercise limited by: None identified  Diet Patient reports consuming 3 meals a day and 1 snack(s) a day Patient reports that her primary diet is: Regular Patient reports that she does have regular access to food.   Depression Screen PHQ 2/9 Scores 11/24/2019 11/17/2019 10/04/2018 08/27/2018 05/26/2018 01/11/2018 11/26/2017  PHQ - 2 Score 0 0 0 0 0 0 0     Fall Risk Fall Risk  11/24/2019 11/17/2019 10/04/2018 08/27/2018 05/26/2018  Falls in the past year? 0 0 0 0 No  Number falls in past yr: 0 - - - -  Injury with Fall? 0 - - - -  Risk for fall due to : No Fall Risks - - - -  Follow up Falls evaluation completed - - - -     Objective:  Tracey Juarez seemed alert and oriented and she participated appropriately during our telephone visit.  Blood Pressure Weight BMI  BP Readings from Last 3 Encounters:  11/17/19 140/64  05/06/19 (!) 141/53  01/06/19 137/77   Wt Readings from Last 3 Encounters:  11/24/19 159 lb 2.8 oz (72.2 kg)  11/17/19 159 lb 2 oz (72.2 kg)  05/06/19 160 lb 3.2 oz (72.7 kg)   BMI Readings from Last 1 Encounters:  11/24/19 29.11 kg/m    *Unable to obtain current vital signs, weight, and BMI due to telephone visit type  Hearing/Vision  . Tracey Juarez did not seem to have difficulty with  hearing/understanding during the telephone conversation . Reports that she has had a formal eye exam by an eye care professional within the  past year . Reports that she has not had a formal hearing evaluation within the past year *Unable to fully assess hearing and vision during telephone visit type  Cognitive Function: 6CIT Screen 11/24/2019  What Year? 0 points  What month? 0 points  What time? 0 points  Count back from 20 0 points  Months in reverse 0 points  Repeat phrase 0 points  Total Score 0   (Normal:0-7, Significant for Dysfunction: >8)  Normal Cognitive Function Screening: Yes   Immunization & Health Maintenance Record Immunization History  Administered Date(s) Administered  . Influenza, High Dose Seasonal PF 05/09/2013, 05/08/2014, 05/20/2017, 05/26/2018, 05/03/2019  . Influenza,inj,Quad PF,6+ Mos 05/15/2015  . Influenza-Unspecified 05/15/2015, 05/07/2016, 05/20/2017, 05/03/2019  . Moderna SARS-COVID-2 Vaccination 08/31/2019, 09/28/2019  . Pneumococcal Conjugate-13 08/15/2013  . Pneumococcal Polysaccharide-23 12/18/2017  . Pneumococcal-Unspecified 08/15/2013  . Tdap 06/02/2011    Health Maintenance  Topic Date Due  . PNA vac Low Risk Adult (2 of 2 - PCV13) 06/04/2020 (Originally 12/19/2018)  . INFLUENZA VACCINE  03/11/2020  . TETANUS/TDAP  06/01/2021  . DEXA SCAN  Completed  . PAP SMEAR-Modifier  Discontinued       Assessment  This is a routine wellness examination for Tracey Juarez.  Health Maintenance: Due or Overdue There are no preventive care reminders to display for this patient.  Tracey Juarez does not need a referral for Community Assistance: Care Management:   no Social Work:    no Prescription Assistance:  no Nutrition/Diabetes Education:  no   Plan:  Personalized Goals Goals Addressed            This Visit's Progress   . Exercise 3x per week (30 min per time)       11/24/2019 AWV Goal: Exercise for General Health    Patient will verbalize understanding of the benefits of increased physical activity:  Exercising regularly is important. It will improve your overall fitness, flexibility, and endurance.  Regular exercise also will improve your overall health. It can help you control your weight, reduce stress, and improve your bone density.  Over the next year, patient will increase physical activity as tolerated with a goal of at least 150 minutes of moderate physical activity per week.   You can tell that you are exercising at a moderate intensity if your heart starts beating faster and you start breathing faster but can still hold a conversation.  Moderate-intensity exercise ideas include:  Walking 1 mile (1.6 km) in about 15 minutes  Biking  Hiking  Golfing  Dancing  Water aerobics  Patient will verbalize understanding of everyday activities that increase physical activity by providing examples like the following: ? Yard work, such as: ? Pushing a Conservation officer, nature ? Raking and bagging leaves ? Washing your car ? Pushing a stroller ? Shoveling snow ? Gardening ? Washing windows or floors  Patient will be able to explain general safety guidelines for exercising:   Before you start a new exercise program, talk with your health care provider.  Do not exercise so much that you hurt yourself, feel dizzy, or get very short of breath.  Wear comfortable clothes and wear shoes with good support.  Drink plenty of water while you exercise to prevent dehydration or heat stroke.  Work out until your breathing and your heartbeat get faster.       Personalized Health Maintenance & Screening Recommendations  Pneumococcal vaccine  Shingrix  Lung Cancer Screening Recommended: no (Low Dose CT Chest recommended if Age  55-80 years, 30 pack-year currently smoking OR have quit w/in past 15 years) Hepatitis C Screening recommended: no HIV Screening recommended: no  Advanced Directives: Written  information was not prepared per patient's request.  Referrals & Orders No orders of the defined types were placed in this encounter.   Follow-up Plan . Follow-up with Dettinger, Fransisca Kaufmann, MD as planned   I have personally reviewed and noted the following in the patient's chart:   . Medical and social history . Use of alcohol, tobacco or illicit drugs  . Current medications and supplements . Functional ability and status . Nutritional status . Physical activity . Advanced directives . List of other physicians . Hospitalizations, surgeries, and ER visits in previous 12 months . Vitals . Screenings to include cognitive, depression, and falls . Referrals and appointments  In addition, I have reviewed and discussed with Tracey Juarez certain preventive protocols, quality metrics, and Tracey practice recommendations. A written personalized care plan for preventive services as well as general preventive health recommendations is available and can be mailed to the patient at her request.      Wardell Heath, LPN  [phone]   AVS printed and mailed to patient

## 2019-11-24 NOTE — Patient Instructions (Signed)
  [place] Maintenance Summary and Written Plan of Care  [name] ,  Thank you for allowing me to perform your Medicare Annual Wellness Visit and for your ongoing commitment to your health.   Health Maintenance & Immunization History Health Maintenance  Topic Date Due  . PNA vac Low Risk Adult (2 of 2 - PCV13) 06/04/2020 (Originally 12/19/2018)  . INFLUENZA VACCINE  03/11/2020  . TETANUS/TDAP  06/01/2021  . DEXA SCAN  Completed  . PAP SMEAR-Modifier  Discontinued   Immunization History  Administered Date(s) Administered  . Influenza, High Dose Seasonal PF 05/09/2013, 05/08/2014, 05/20/2017, 05/26/2018, 05/03/2019  . Influenza,inj,Quad PF,6+ Mos 05/15/2015  . Influenza-Unspecified 05/15/2015, 05/07/2016, 05/20/2017, 05/03/2019  . Moderna SARS-COVID-2 Vaccination 08/31/2019, 09/28/2019  . Pneumococcal Conjugate-13 08/15/2013  . Pneumococcal Polysaccharide-23 12/18/2017  . Pneumococcal-Unspecified 08/15/2013  . Tdap 06/02/2011    These are the patient goals that we discussed: Goals Addressed            This Visit's Progress   . Exercise 3x per week (30 min per time)       11/24/2019 AWV Goal: Exercise for General Health   Patient will verbalize understanding of the benefits of increased physical activity:  Exercising regularly is important. It will improve your overall fitness, flexibility, and endurance.  Regular exercise also will improve your overall health. It can help you control your weight, reduce stress, and improve your bone density.  Over the next year, patient will increase physical activity as tolerated with a goal of at least 150 minutes of moderate physical activity per week.   You can tell that you are exercising at a moderate intensity if your heart starts beating faster and you start breathing faster but can still hold a conversation.  Moderate-intensity exercise ideas include:  Walking 1 mile (1.6 km) in about 15  minutes  Biking  Hiking  Golfing  Dancing  Water aerobics  Patient will verbalize understanding of everyday activities that increase physical activity by providing examples like the following: ? Yard work, such as: ? Pushing a Conservation officer, nature ? Raking and bagging leaves ? Washing your car ? Pushing a stroller ? Shoveling snow ? Gardening ? Washing windows or floors  Patient will be able to explain general safety guidelines for exercising:   Before you start a new exercise program, talk with your health care provider.  Do not exercise so much that you hurt yourself, feel dizzy, or get very short of breath.  Wear comfortable clothes and wear shoes with good support.  Drink plenty of water while you exercise to prevent dehydration or heat stroke.  Work out until your breathing and your heartbeat get faster.         This is a list of Health Maintenance Items that are overdue or due now: There are no preventive care reminders to display for this patient.   Orders/Referrals Placed Today: No orders of the defined types were placed in this encounter.  (Contact our referral department at [phone] if you have not spoken with someone about your referral appointment within the next 5 days)    Follow-up Plan Keep follow up with Dr. Warrick Parisian

## 2019-12-05 ENCOUNTER — Other Ambulatory Visit: Payer: Self-pay | Admitting: Family Medicine

## 2019-12-05 DIAGNOSIS — H9312 Tinnitus, left ear: Secondary | ICD-10-CM

## 2019-12-05 DIAGNOSIS — R42 Dizziness and giddiness: Secondary | ICD-10-CM

## 2019-12-13 ENCOUNTER — Other Ambulatory Visit: Payer: Self-pay

## 2019-12-13 ENCOUNTER — Ambulatory Visit (INDEPENDENT_AMBULATORY_CARE_PROVIDER_SITE_OTHER): Payer: Medicare Other | Admitting: Nurse Practitioner

## 2019-12-13 ENCOUNTER — Encounter: Payer: Self-pay | Admitting: Nurse Practitioner

## 2019-12-13 VITALS — BP 160/71 | HR 69 | Temp 98.1°F | Resp 20 | Ht 62.0 in | Wt 157.0 lb

## 2019-12-13 DIAGNOSIS — S80262A Insect bite (nonvenomous), left knee, initial encounter: Secondary | ICD-10-CM

## 2019-12-13 DIAGNOSIS — S80261A Insect bite (nonvenomous), right knee, initial encounter: Secondary | ICD-10-CM | POA: Insufficient documentation

## 2019-12-13 DIAGNOSIS — W57XXXA Bitten or stung by nonvenomous insect and other nonvenomous arthropods, initial encounter: Secondary | ICD-10-CM

## 2019-12-13 MED ORDER — DIPHENHYDRAMINE-ZINC ACETATE 2-0.1 % EX CREA: 1.0000 "application " | TOPICAL_CREAM | Freq: Three times a day (TID) | CUTANEOUS | 0 refills | Status: DC | PRN

## 2019-12-13 MED ORDER — PREDNISONE 10 MG (21) PO TBPK: ORAL_TABLET | ORAL | 0 refills | Status: DC

## 2019-12-13 NOTE — Progress Notes (Signed)
Acute Office Visit  Subjective:    Patient ID: Tracey Juarez, female    DOB: [birth year], 79 y.o.   MRN: [id number]  Chief Complaint  Patient presents with  . Insect Bite    forehead and left knee     HPI Patient is in today for insect bite to her forehead and left knee, pt noticed a spot on her knee on Sunday after working in the yard a day prior. A few hours later she noticed a rash on her right forehead that looked nothing like the spot on her left knee. paitent reports no pain but says both sites are unbearably itchy and noticed the spot on her right forehead is increasing in size. calamine lotion used at home did not help alleviate symptoms so patient decided to come to the clinic.  Past Medical History:  Diagnosis Date  . Atrophic vaginitis   . Breast cancer in female [facility])   . Breast cancer of upper-outer quadrant of left female breast ([place]) 03/20/2015  . Detrusor instability   . Elevated cholesterol   . GERD (gastroesophageal reflux disease)   . Hot flashes   . Humerus head fracture, left, with routine healing, subsequent encounter 2000  . Hypertension   . Melanoma ([place])   . Mixed basal-squamous cell carcinoma    skin  . Parkinson disease ([place])   . Stroke [facility]    on Plavix, no deficits    Past Surgical History:  Procedure Laterality Date  . BRAIN SURGERY    . BREAST LUMPECTOMY Left 04/2015   radiation  . BREAST LUMPECTOMY WITH RADIOACTIVE SEED AND SENTINEL LYMPH NODE BIOPSY Left 04/25/2015   Procedure: LEFT BREAST PARTIAL MASTECTOMY WITH RADIOACTIVE SEED AND SENTINEL LYMPH NODE MAPPING;  Surgeon: Erroll Luna, MD;  Location: [place];  Service: General;  Laterality: Left;  . FOOT SURGERY  2004  . KNEE SURGERY  2007  . skin cancer excised    . TONSILLECTOMY      Family History  Problem Relation Age of Onset  . Hypertension Mother   . Heart attack Mother   . Heart attack Maternal Aunt   . Heart attack Maternal Uncle   . Breast cancer  Neg Hx     Social History   Socioeconomic History  . Marital status: Married    Spouse name: Joneen Boers   . Number of children: 0  . Years of education: ged  . Highest education level: GED or equivalent  Occupational History  . Occupation: Retired  Tobacco Use  . Smoking status: Never Smoker  . Smokeless tobacco: Never Used  Substance and Sexual Activity  . Alcohol use: No    Alcohol/week: 0.0 standard drinks  . Drug use: No  . Sexual activity: Never    Birth control/protection: Post-menopausal    Comment: 1st intercourse 58 yo-2 partners  Other Topics Concern  . Not on file  Social History Narrative  . Not on file   Social Determinants of Health   Financial Resource Strain: Low Risk   . Difficulty of Paying Living Expenses: Not hard at all  Food Insecurity: No Food Insecurity  . Worried About Charity fundraiser in the Last Year: Never true  . Ran Out of Food in the Last Year: Never true  Transportation Needs: No Transportation Needs  . Lack of Transportation (Medical): No  . Lack of Transportation (Non-Medical): No  Physical Activity: Inactive  . Days of Exercise per Week: 0 days  . Minutes  of Exercise per Session: 0 min  Stress: No Stress Concern Present  . Feeling of Stress : Not at all  Social Connections: Not Isolated  . Frequency of Communication with Friends and Family: More than three times a week  . Frequency of Social Gatherings with Friends and Family: Once a week  . Attends Religious Services: More than 4 times per year  . Active Member of Clubs or Organizations: Yes  . Attends Archivist Meetings: More than 4 times per year  . Marital Status: Married  Human resources officer Violence: Not At Risk  . Fear of Current or Ex-Partner: No  . Emotionally Abused: No  . Physically Abused: No  . Sexually Abused: No    Outpatient Medications Prior to Visit  Medication Sig Dispense Refill  . acetaminophen (TYLENOL) 325 MG tablet Take 650 mg by mouth every 6  (six) hours as needed.    Marland Kitchen anastrozole (ARIMIDEX) 1 MG tablet Take 1 tablet (1 mg total) by mouth daily. 90 tablet 3  . Carbidopa-Levodopa ER (SINEMET CR) 25-100 MG tablet controlled release Take 1 tablet by mouth 3 (three) times daily.    . cetirizine (ZYRTEC) 10 MG tablet Take 10 mg by mouth daily.    . Cholecalciferol (VITAMIN D3) 1.25 MG (50000 UT) TABS Take by mouth. Taking 4,000 ui daily    . clopidogrel (PLAVIX) 75 MG tablet Take 75 mg by mouth daily.    . famotidine (PEPCID) 10 MG tablet Take 10 mg by mouth daily.    . Ginger, Zingiber officinalis, (GINGER ROOT) 550 MG CAPS Take by mouth.    Marland Kitchen lisinopril (ZESTRIL) 10 MG tablet Take 1 tablet (10 mg total) by mouth daily. 90 tablet 3  . Magnesium 250 MG TABS Take 1 tablet by mouth daily.    . meclizine (ANTIVERT) 12.5 MG tablet TAKE 1 TO 2 TABLETS BY MOUTH THREE TIMES DAILY AS NEEDED FOR DIZZINESS 60 tablet 0  . Multiple Vitamin (MULTIVITAMIN) capsule Take 1 capsule by mouth daily.      Marland Kitchen oxybutynin (DITROPAN-XL) 5 MG 24 hr tablet Take 1 tablet (5 mg total) by mouth at bedtime. 90 tablet 3  . pramipexole (MIRAPEX) 0.25 MG tablet Take 2 tablets by mouth 3 (three) times daily.     . pramipexole (MIRAPEX) 0.5 MG tablet Take 0.5 mg by mouth 3 (three) times daily.    . Psyllium (FIBER THERAPY) 25 % POWD Take by mouth.    . Risedronate Sodium 35 MG TBEC Take 1 tablet by mouth once a week 12 tablet 0  . rosuvastatin (CRESTOR) 10 MG tablet Take 1 tablet (10 mg total) by mouth daily. 90 tablet 3   No facility-administered medications prior to visit.    Allergies  Allergen Reactions  . Ondansetron Swelling and Palpitations    Review of Systems  Constitutional: Negative for activity change and appetite change.  Respiratory: Negative for chest tightness and shortness of breath.   Cardiovascular: Negative for palpitations.  Genitourinary: Negative for difficulty urinating.  Musculoskeletal: Negative for arthralgias and myalgias.  Skin:  Positive for color change and rash.  Neurological: Negative for headaches.  Psychiatric/Behavioral: Negative.        Objective:    Physical Exam Constitutional:      Appearance: Normal appearance.  HENT:     Head: Normocephalic.     Nose: Nose normal.  Eyes:     Conjunctiva/sclera: Conjunctivae normal.  Cardiovascular:     Rate and Rhythm: Normal rate and regular rhythm.  Pulses: Normal pulses.     Heart sounds: Normal heart sounds.  Pulmonary:     Effort: Pulmonary effort is normal.     Breath sounds: Normal breath sounds.  Abdominal:     Palpations: Abdomen is soft.  Musculoskeletal:        General: No tenderness.     Cervical back: Neck supple.  Skin:    General: Skin is warm.     Findings: Erythema and rash present.     Comments: Slight swelling and redness on left knee  Neurological:     Mental Status: She is alert and oriented to person, place, and time.  Psychiatric:        Mood and Affect: Mood normal.        Behavior: Behavior normal.     BP (!) 160/71   Pulse 69   Temp 98.1 F (36.7 C)   Resp 20   Ht '5\' 2"'  (1.575 m)   Wt 157 lb (71.2 kg)   SpO2 99%   BMI 28.72 kg/m  Wt Readings from Last 3 Encounters:  12/13/19 157 lb (71.2 kg)  11/24/19 159 lb 2.8 oz (72.2 kg)  11/17/19 159 lb 2 oz (72.2 kg)         Lab Results  Component Value Date   TSH 1.990 02/07/2019   Lab Results  Component Value Date   WBC 4.9 11/17/2019   HGB 13.1 11/17/2019   HCT 40.9 11/17/2019   MCV 92 11/17/2019   PLT 226 11/17/2019   Lab Results  Component Value Date   NA 140 11/17/2019   K 4.4 11/17/2019   CHLORIDE 105 05/06/2017   CO2 23 11/17/2019   GLUCOSE 92 11/17/2019   BUN 14 11/17/2019   CREATININE 0.85 11/17/2019   BILITOT 0.4 11/17/2019   ALKPHOS 69 11/17/2019   AST 20 11/17/2019   ALT 4 11/17/2019   PROT 6.3 11/17/2019   ALBUMIN 4.5 11/17/2019   CALCIUM 9.6 11/17/2019   ANIONGAP 6 05/06/2019   EGFR 63 (L) 05/06/2017   Lab Results    Component Value Date   CHOL 164 11/17/2019   Lab Results  Component Value Date   HDL 55 11/17/2019   Lab Results  Component Value Date   LDLCALC 89 11/17/2019   Lab Results  Component Value Date   TRIG 109 11/17/2019   Lab Results  Component Value Date   CHOLHDL 3.0 11/17/2019   No results found for: HGBA1C     Assessment & Plan:   Problem List Items Addressed This Visit      Musculoskeletal and Integument   Insect bite of right knee - Primary    Insect bite on forehead and left knee is moderately severe and not well managed, started patient on prednisone pack, topical benadryl, educated patient to use insect repellant when working in the garden, keep area clean and avoid scratching with fingers. Printed education material available to patient          Meds ordered this encounter  Medications  . predniSONE (STERAPRED UNI-PAK 21 TAB) 10 MG (21) TBPK tablet    Sig: 6 tablet by mouth day 1, 5 tab day 2, 4 tab day 3, 4 tab day 4, 3 tab day 5, 2 tab day 4, 1tab day  6    Dispense:  1 each    Refill:  0    Order Specific Question:   Supervising Provider    Answer:   Caryl Pina A [[id number]]  .  diphenhydrAMINE-zinc acetate (BENADRYL) cream    Sig: Apply 1 application topically 3 (three) times daily as needed for itching.    Dispense:  28.4 g    Refill:  0    Order Specific Question:   Supervising Provider    Answer:   Caryl Pina A [[id number]]     Ivy Lynn, NP

## 2019-12-13 NOTE — Patient Instructions (Signed)
Insect bite is moderately severe and not well managed, started patient on prednisone pack, topical benadryl, educated patient to use insect repellant when working in the garden, keep area clean and avoid scratching with fingers. Printed education material available to patient.    Insect Bite, Adult  An insect bite can make your skin red, itchy, and swollen. Some insects can spread disease to people with a bite. However, most insect bites do not lead to disease, and most are not serious. What are the causes? Insects may bite for many reasons, including:  Hunger.  To defend themselves. Insects that bite include:  Spiders.  Mosquitoes.  Ticks.  Fleas.  Ants.  Flies.  Kissing bugs.  Chiggers. What are the signs or symptoms? Symptoms of this condition include:  Itching or pain in the bite area.  Redness and swelling in the bite area.  An open wound (skin ulcer). Symptoms often last for 2-4 days. In rare cases, a person may have a very bad allergic reaction (anaphylactic reaction) to a bite. Symptoms of an anaphylactic reaction may include:  Feeling warm in the face (flushed). Your face may turn red.  Itchy, red, swollen areas of skin (hives).  Swelling of the: ? Eyes. ? Lips. ? Face. ? Mouth. ? Tongue. ? Throat.  Trouble with any of these: ? Breathing. ? Talking. ? Swallowing.  Loud breathing (wheezing).  Feeling dizzy or light-headed.  Passing out (fainting).  Pain or cramps in your belly.  Throwing up (vomiting).  Watery poop (diarrhea). How is this treated? Treatment is usually not needed. Symptoms often go away on their own. When treatment is needed, it may involve:  Putting a cream or lotion on the bite area. This helps with itching.  Taking an antibiotic medicine. This treatment is needed if the bite area gets infected.  Getting a tetanus shot, if you are not up to date on this vaccine.  Putting ice on the affected area.  Using medicines  called antihistamines. This treatment may be needed if you have itching or an allergic reaction to the insect bite.  Giving yourself a shot of medicine (epinephrine) using an auto-injector "pen" if you have an anaphylactic reaction to a bite. Your doctor will teach you how to use this pen. Follow these instructions at home: Bite area care   Do not scratch the bite area.  Keep the bite area clean and dry.  Wash the bite area every day with soap and water as told by your doctor.  Check the bite area every day for signs of infection. Check for: ? Redness, swelling, or pain. ? Fluid or blood. ? Warmth. ? Pus or a bad smell. Managing pain, itching, and swelling   You may put any of these on the bite area as told by your doctor: ? A paste made of baking soda and water. ? Cortisone cream. ? Calamine lotion.  If told, put ice on the bite area. ? Put ice in a plastic bag. ? Place a towel between your skin and the bag. ? Leave the ice on for 20 minutes, 2-3 times a day. General instructions  Apply or take over-the-counter and prescription medicines only as told by your doctor.  If you were prescribed an antibiotic medicine, take or apply it as told by your doctor. Do not stop using the antibiotic even if your condition improves.  Keep all follow-up visits as told by your doctor. This is important. How is this prevented? To help you have a lower  risk of insect bites:  When you are outside, wear clothing that covers your arms and legs.  Use insect repellent. The best insect repellents contain one of these: ? DEET. ? Picaridin. ? Oil of lemon eucalyptus (OLE). ? IR3535.  Consider spraying your clothing with a pesticide called permethrin. Permethrin helps prevent insect bites. It works for several weeks and for up to 5-6 clothing washes. Do not apply permethrin directly to the skin.  If your home windows do not have screens, think about putting some in.  If you will be sleeping  in an area where there are mosquitoes, consider covering your sleeping area with a mosquito net. Contact a doctor if:  You have redness, swelling, or pain in the bite area.  You have fluid or blood coming from the bite area.  The bite area feels warm to the touch.  You have pus or a bad smell coming from the bite area.  You have a fever. Get help right away if:  You have joint pain.  You have a rash.  You feel more tired or sleepy than you normally do.  You have neck pain.  You have a headache.  You feel weaker than you normally do.  You have signs of an anaphylactic reaction. Signs may include: ? Feeling warm in the face. ? Itchy, red, swollen areas of skin. ? Swelling of your:  Eyes.  Lips.  Face.  Mouth.  Tongue.  Throat. ? Trouble with any of these:  Breathing.  Talking.  Swallowing. ? Loud breathing. ? Feeling dizzy or light-headed. ? Passing out. ? Pain or cramps in your belly. ? Throwing up. ? Watery poop. These symptoms may be an emergency. Do not wait to see if the symptoms will go away. Do this right away:  Use your auto-injector pen as you have been told.  Get medical help. Call your local emergency services (911 in the [place] Do not drive yourself to the hospital. Summary  An insect bite can make your skin red, itchy, and swollen.  Treatment is usually not needed. Symptoms often go away on their own.  Do not scratch the bite area. Keep it clean and dry.  Ice can help with pain and itching from the bite. This information is not intended to replace advice given to you by your health care provider. Make sure you discuss any questions you have with your health care provider. Document Revised: 02/05/2018 Document Reviewed: 02/05/2018 Elsevier Patient Education  [place].

## 2019-12-13 NOTE — Assessment & Plan Note (Signed)
Insect bite on forehead and left knee is moderately severe and not well managed, started patient on prednisone pack, topical benadryl, educated patient to use insect repellant when working in the garden, keep area clean and avoid scratching with fingers. Printed education material available to patient

## 2019-12-15 ENCOUNTER — Encounter: Payer: Self-pay | Admitting: Family Medicine

## 2019-12-15 ENCOUNTER — Telehealth: Payer: Self-pay | Admitting: Family Medicine

## 2019-12-15 ENCOUNTER — Ambulatory Visit (INDEPENDENT_AMBULATORY_CARE_PROVIDER_SITE_OTHER): Payer: Medicare Other | Admitting: Family Medicine

## 2019-12-15 DIAGNOSIS — L249 Irritant contact dermatitis, unspecified cause: Secondary | ICD-10-CM

## 2019-12-15 NOTE — Telephone Encounter (Signed)
To be able to discern on the rash whether it shingles or a bug bite I would have to see it, as it spreading at all, that would be important as well.  It sounds like the pain in her chest and indigestion is from heartburn which can be caused or inflamed by prednisone, she could either continue the prednisone and just finish the course and take Pepcid with it so that it does not cause heartburn or she could back off the prednisone and see what it does for her.

## 2019-12-15 NOTE — Telephone Encounter (Signed)
Patient aware and verbalized understanding. Due to symptoms will have to have virtual visit has appt today.

## 2019-12-15 NOTE — Progress Notes (Signed)
Virtual Visit via telephone Note  I connected with Tracey Juarez on 12/15/19 at 1741 by telephone and verified that I am speaking with the correct person using two identifiers. Tracey Juarez is currently located at home and no other people are currently with her during visit. The provider, Fransisca Kaufmann Kalan Rinn, MD is located in their office at time of visit.  Call ended at 1751  I discussed the limitations, risks, security and privacy concerns of performing an evaluation and management service by telephone and the availability of in person appointments. I also discussed with the patient that there may be a patient responsible charge related to this service. The patient expressed understanding and agreed to proceed.   History and Present Illness: Patient is calling in for a rash on her leg and forehead.  The rash on the leg is gone and the rash on forehead is improving. She is on her 3rd day of the prednisone.  She said it started at right hairline on forehead and came down and fanned out on the forehead.  It does itch a little. She denies pain.    1. Irritant contact dermatitis, unspecified trigger     Outpatient Encounter Medications as of 12/15/2019  Medication Sig  . acetaminophen (TYLENOL) 325 MG tablet Take 650 mg by mouth every 6 (six) hours as needed.  Marland Kitchen anastrozole (ARIMIDEX) 1 MG tablet Take 1 tablet (1 mg total) by mouth daily.  . Carbidopa-Levodopa ER (SINEMET CR) 25-100 MG tablet controlled release Take 1 tablet by mouth 3 (three) times daily.  . cetirizine (ZYRTEC) 10 MG tablet Take 10 mg by mouth daily.  . Cholecalciferol (VITAMIN D3) 1.25 MG (50000 UT) TABS Take by mouth. Taking 4,000 ui daily  . clopidogrel (PLAVIX) 75 MG tablet Take 75 mg by mouth daily.  . diphenhydrAMINE-zinc acetate (BENADRYL) cream Apply 1 application topically 3 (three) times daily as needed for itching.  . famotidine (PEPCID) 10 MG tablet Take 10 mg by mouth daily.  . Ginger, Zingiber  officinalis, (GINGER ROOT) 550 MG CAPS Take by mouth.  Marland Kitchen lisinopril (ZESTRIL) 10 MG tablet Take 1 tablet (10 mg total) by mouth daily.  . Magnesium 250 MG TABS Take 1 tablet by mouth daily.  . meclizine (ANTIVERT) 12.5 MG tablet TAKE 1 TO 2 TABLETS BY MOUTH THREE TIMES DAILY AS NEEDED FOR DIZZINESS  . Multiple Vitamin (MULTIVITAMIN) capsule Take 1 capsule by mouth daily.    Marland Kitchen oxybutynin (DITROPAN-XL) 5 MG 24 hr tablet Take 1 tablet (5 mg total) by mouth at bedtime.  . pramipexole (MIRAPEX) 0.25 MG tablet Take 2 tablets by mouth 3 (three) times daily.   . pramipexole (MIRAPEX) 0.5 MG tablet Take 0.5 mg by mouth 3 (three) times daily.  . predniSONE (STERAPRED UNI-PAK 21 TAB) 10 MG (21) TBPK tablet 6 tablet by mouth day 1, 5 tab day 2, 4 tab day 3, 4 tab day 4, 3 tab day 5, 2 tab day 4, 1tab day  6  . Psyllium (FIBER THERAPY) 25 % POWD Take by mouth.  . Risedronate Sodium 35 MG TBEC Take 1 tablet by mouth once a week  . rosuvastatin (CRESTOR) 10 MG tablet Take 1 tablet (10 mg total) by mouth daily.   No facility-administered encounter medications on file as of 12/15/2019.    Review of Systems  Constitutional: Negative for chills and fever.  Eyes: Negative for visual disturbance.  Respiratory: Negative for chest tightness and shortness of breath.   Cardiovascular: Negative for  chest pain and leg swelling.  Genitourinary: Negative for difficulty urinating and dysuria.  Musculoskeletal: Negative for back pain and gait problem.  Skin: Negative for rash.  Neurological: Negative for light-headedness and headaches.  Psychiatric/Behavioral: Negative for agitation and behavioral problems.  All other systems reviewed and are negative.   Observations/Objective: Patient sounds comfortable and in no acute distress  Assessment and Plan: Problem List Items Addressed This Visit    None    Visit Diagnoses    Irritant contact dermatitis, unspecified trigger    -  Primary     recommended topical  antibiotic cream and take steroid.   Follow up plan: Return if symptoms worsen or fail to improve.     I discussed the assessment and treatment plan with the patient. The patient was provided an opportunity to ask questions and all were answered. The patient agreed with the plan and demonstrated an understanding of the instructions.   The patient was advised to call back or seek an in-person evaluation if the symptoms worsen or if the condition fails to improve as anticipated.  The above assessment and management plan was discussed with the patient. The patient verbalized understanding of and has agreed to the management plan. Patient is aware to call the clinic if symptoms persist or worsen. Patient is aware when to return to the clinic for a follow-up visit. Patient educated on when it is appropriate to go to the emergency department.    I provided 10 minutes of non-face-to-face time during this encounter.    Worthy Rancher, MD

## 2020-01-06 ENCOUNTER — Telehealth: Payer: Self-pay | Admitting: Family Medicine

## 2020-01-06 MED ORDER — FLUCONAZOLE 150 MG PO TABS: 150.0000 mg | ORAL_TABLET | ORAL | 0 refills | Status: DC | PRN

## 2020-01-06 NOTE — Telephone Encounter (Signed)
Diflucan Prescription sent to pharmacy   

## 2020-01-06 NOTE — Telephone Encounter (Signed)
°  Prescription Request  01/06/2020  What is the name of the medication or equipment? Pt has been on antibiotic from urgent care and now she has yeast infection  Have you contacted your pharmacy to request a refill? (if applicable) no  Which pharmacy would you like this sent to? [facility]   Patient notified that their request is being sent to the clinical staff for review and that they should receive a response within 2 business days.

## 2020-01-06 NOTE — Telephone Encounter (Signed)
Patient aware.

## 2020-01-06 NOTE — Telephone Encounter (Signed)
Can we send in diflucan for patient?

## 2020-01-16 ENCOUNTER — Telehealth: Payer: Self-pay | Admitting: Family Medicine

## 2020-01-16 NOTE — Telephone Encounter (Signed)
°  Prescription Request  01/16/2020  What is the name of the medication or equipment? Antibiotic  Have you contacted your pharmacy to request a refill? (if applicable) No  Which pharmacy would you like this sent to? [facility], [place]  Pt says she recently had a visit with Evelina Dun who prescribed her an antibiotic for a yeast infection. Pt says antibiotic has helped but has not fully gone away. Pt needs refill called in.   Patient notified that their request is being sent to the clinical staff for review and that they should receive a response within 2 business days.

## 2020-01-16 NOTE — Telephone Encounter (Signed)
No I think at this point we just do the examination and testing because obviously we need to figure out what we are treating

## 2020-01-16 NOTE — Telephone Encounter (Signed)
Patient states that she is having vaginal itching, redness and swelling.  Completed fluconazole on Thursday 6/3.  Patient has tried using Vagisil with no relief.  Appt made to be seen tomorrow 6/8.  Patient would like to know if something could be sent in until she can be seen

## 2020-01-16 NOTE — Telephone Encounter (Signed)
Patient aware.

## 2020-01-17 ENCOUNTER — Other Ambulatory Visit: Payer: Self-pay

## 2020-01-17 ENCOUNTER — Ambulatory Visit (INDEPENDENT_AMBULATORY_CARE_PROVIDER_SITE_OTHER): Payer: Medicare Other | Admitting: Nurse Practitioner

## 2020-01-17 ENCOUNTER — Encounter: Payer: Self-pay | Admitting: Nurse Practitioner

## 2020-01-17 VITALS — BP 119/68 | HR 76 | Temp 97.8°F | Resp 20 | Ht 62.0 in | Wt 156.0 lb

## 2020-01-17 DIAGNOSIS — B372 Candidiasis of skin and nail: Secondary | ICD-10-CM | POA: Diagnosis not present

## 2020-01-17 DIAGNOSIS — N898 Other specified noninflammatory disorders of vagina: Secondary | ICD-10-CM | POA: Diagnosis not present

## 2020-01-17 MED ORDER — NYSTATIN 100000 UNIT/GM EX CREA: 1.0000 "application " | TOPICAL_CREAM | Freq: Two times a day (BID) | CUTANEOUS | 0 refills | Status: DC

## 2020-01-17 NOTE — Patient Instructions (Signed)

## 2020-01-17 NOTE — Progress Notes (Signed)
Subjective:    Patient ID: Tracey Juarez, female    DOB: Dec 02, 1940, 79 y.o.   MRN: [id number]   Chief Complaint: Vaginal Itching   HPI Pt bit by tick ~3weeks ago, was seen at an UC & prescribed ABX, ~2nd week of doxy pt began having vaginal itching with erythema. Denies discharge. Burning during urination & some urinary frequency. OTC vagisil used with no relief.     Review of Systems  Constitutional: Negative for diaphoresis.  Eyes: Negative for pain.  Respiratory: Negative for shortness of breath.   Cardiovascular: Negative for chest pain, palpitations and leg swelling.  Gastrointestinal: Negative for abdominal pain.  Endocrine: Negative for polydipsia.  Skin: Negative for rash.  Neurological: Negative for dizziness, weakness and headaches.  Hematological: Does not bruise/bleed easily.  All other systems reviewed and are negative.      Objective:   Physical Exam Exam conducted with a chaperone present.  Constitutional:      Appearance: Normal appearance. She is normal weight.  HENT:     Head: Normocephalic.     Mouth/Throat:     Mouth: Mucous membranes are moist.     Pharynx: Oropharynx is clear.  Eyes:     Pupils: Pupils are equal, round, and reactive to light.  Cardiovascular:     Rate and Rhythm: Normal rate and regular rhythm.     Pulses: Normal pulses.     Heart sounds: Normal heart sounds.  Pulmonary:     Effort: Pulmonary effort is normal.     Breath sounds: Normal breath sounds.  Abdominal:     Palpations: Abdomen is soft.  Genitourinary:    Comments: Excoriated perineum Labial adhesions No vaginal discharge Musculoskeletal:        General: Normal range of motion.     Cervical back: Normal range of motion and neck supple.  Skin:    General: Skin is warm and dry.     Capillary Refill: Capillary refill takes less than 2 seconds.  Neurological:     General: No focal deficit present.     Mental Status: She is alert and oriented to person,  place, and time. Mental status is at baseline.  Psychiatric:        Mood and Affect: Mood normal.        Behavior: Behavior normal.        Thought Content: Thought content normal.        Judgment: Judgment normal.   BP 119/68   Pulse 76   Temp 97.8 F (36.6 C) (Temporal)   Resp 20   Ht 5\' 2"  (1.575 m)   Wt 156 lb (70.8 kg)   SpO2 97%   BMI 28.53 kg/m          Assessment & Plan:  Tracey Juarez in today with chief complaint of Vaginal Itching   1. Itching in the vaginal area Avoid scratching  2. Cutaneous candidiasis Keep area as dry as possible No perfume soap Dove soap Meds ordered this encounter  Medications  . nystatin cream (MYCOSTATIN)    Sig: Apply 1 application topically 2 (two) times daily.    Dispense:  30 g    Refill:  0    Order Specific Question:   Supervising Provider    Answer:   Caryl Pina A [[id number]]   \    The above assessment and management plan was discussed with the patient. The patient verbalized understanding of and has agreed to the management plan. Patient is  aware to call the clinic if symptoms persist or worsen. Patient is aware when to return to the clinic for a follow-up visit. Patient educated on when it is appropriate to go to the emergency department.   Mary-Margaret Hassell Done, FNP

## 2020-02-10 ENCOUNTER — Telehealth: Payer: Self-pay | Admitting: Hematology

## 2020-02-10 NOTE — Telephone Encounter (Signed)
R/s due to changes in template. Pt is aware of appt time and date

## 2020-02-14 ENCOUNTER — Ambulatory Visit (INDEPENDENT_AMBULATORY_CARE_PROVIDER_SITE_OTHER): Payer: Medicare Other | Admitting: Nurse Practitioner

## 2020-02-14 ENCOUNTER — Encounter: Payer: Self-pay | Admitting: Nurse Practitioner

## 2020-02-14 ENCOUNTER — Other Ambulatory Visit: Payer: Self-pay

## 2020-02-14 VITALS — BP 122/80 | Ht 61.0 in | Wt 157.0 lb

## 2020-02-14 DIAGNOSIS — Z9289 Personal history of other medical treatment: Secondary | ICD-10-CM | POA: Diagnosis not present

## 2020-02-14 DIAGNOSIS — Z01419 Encounter for gynecological examination (general) (routine) without abnormal findings: Secondary | ICD-10-CM

## 2020-02-14 DIAGNOSIS — M8589 Other specified disorders of bone density and structure, multiple sites: Secondary | ICD-10-CM

## 2020-02-14 DIAGNOSIS — C50412 Malignant neoplasm of upper-outer quadrant of left female breast: Secondary | ICD-10-CM

## 2020-02-14 DIAGNOSIS — Z853 Personal history of malignant neoplasm of breast: Secondary | ICD-10-CM

## 2020-02-14 NOTE — Patient Instructions (Signed)
Health Maintenance After Age 78 After age 78, you are at a higher risk for certain long-term diseases and infections as well as injuries from falls. Falls are a major cause of broken bones and head injuries in people who are older than age 78. Getting regular preventive care can help to keep you healthy and well. Preventive care includes getting regular testing and making lifestyle changes as recommended by your health care provider. Talk with your health care provider about:  Which screenings and tests you should have. A screening is a test that checks for a disease when you have no symptoms.  A diet and exercise plan that is right for you. What should I know about screenings and tests to prevent falls? Screening and testing are the best ways to find a health problem early. Early diagnosis and treatment give you the best chance of managing medical conditions that are common after age 78. Certain conditions and lifestyle choices may make you more likely to have a fall. Your health care provider may recommend:  Regular vision checks. Poor vision and conditions such as cataracts can make you more likely to have a fall. If you wear glasses, make sure to get your prescription updated if your vision changes.  Medicine review. Work with your health care provider to regularly review all of the medicines you are taking, including over-the-counter medicines. Ask your health care provider about any side effects that may make you more likely to have a fall. Tell your health care provider if any medicines that you take make you feel dizzy or sleepy.  Osteoporosis screening. Osteoporosis is a condition that causes the bones to get weaker. This can make the bones weak and cause them to break more easily.  Blood pressure screening. Blood pressure changes and medicines to control blood pressure can make you feel dizzy.  Strength and balance checks. Your health care provider may recommend certain tests to check your  strength and balance while standing, walking, or changing positions.  Foot health exam. Foot pain and numbness, as well as not wearing proper footwear, can make you more likely to have a fall.  Depression screening. You may be more likely to have a fall if you have a fear of falling, feel emotionally low, or feel unable to do activities that you used to do.  Alcohol use screening. Using too much alcohol can affect your balance and may make you more likely to have a fall. What actions can I take to lower my risk of falls? General instructions  Talk with your health care provider about your risks for falling. Tell your health care provider if: ? You fall. Be sure to tell your health care provider about all falls, even ones that seem minor. ? You feel dizzy, sleepy, or off-balance.  Take over-the-counter and prescription medicines only as told by your health care provider. These include any supplements.  Eat a healthy diet and maintain a healthy weight. A healthy diet includes low-fat dairy products, low-fat (lean) meats, and fiber from whole grains, beans, and lots of fruits and vegetables. Home safety  Remove any tripping hazards, such as rugs, cords, and clutter.  Install safety equipment such as grab bars in bathrooms and safety rails on stairs.  Keep rooms and walkways well-lit. Activity   Follow a regular exercise program to stay fit. This will help you maintain your balance. Ask your health care provider what types of exercise are appropriate for you.  If you need a cane or   walker, use it as recommended by your health care provider.  Wear supportive shoes that have nonskid soles. Lifestyle  Do not drink alcohol if your health care provider tells you not to drink.  If you drink alcohol, limit how much you have: ? 0-1 drink a day for women. ? 0-2 drinks a day for men.  Be aware of how much alcohol is in your drink. In the [place] one drink equals one typical bottle of beer (12  oz), one-half glass of wine (5 oz), or one shot of hard liquor (1 oz).  Do not use any products that contain nicotine or tobacco, such as cigarettes and e-cigarettes. If you need help quitting, ask your health care provider. Summary  Having a healthy lifestyle and getting preventive care can help to protect your health and wellness after age 78.  Screening and testing are the best way to find a health problem early and help you avoid having a fall. Early diagnosis and treatment give you the best chance for managing medical conditions that are more common for people who are older than age 78.  Falls are a major cause of broken bones and head injuries in people who are older than age 78. Take precautions to prevent a fall at home.  Work with your health care provider to learn what changes you can make to improve your health and wellness and to prevent falls. This information is not intended to replace advice given to you by your health care provider. Make sure you discuss any questions you have with your health care provider. Document Revised: 11/18/2018 Document Reviewed: 06/10/2017 Elsevier Patient Education  2020 [employer]  

## 2020-02-14 NOTE — Progress Notes (Signed)
   Tracey Juarez 18-Sep-1940 [id number]   History:  79 y.o. G0 presents for breast and pelvic exam without GYN complaints. Treated recently for yeast infection after a course of antibiotics, says symptoms have resolved. 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 No HRT.   Gynecologic History No LMP recorded. Patient is postmenopausal.   Last Pap: no longer screening per guidelines  Last mammogram: 04/28/2019. Results were: normal Last colonoscopy: 2015. Results were: normal Last Dexa: 2018. We do not have these records. 2015 t-score -1.9  Past medical history, past surgical history, family history and social history were all reviewed and documented in the EPIC chart.  ROS:  A ROS was performed and pertinent positives and negatives are included.  Exam:  Vitals:   02/14/20 1407  BP: 122/80  Weight: 157 lb (71.2 kg)  Height: 5\' 1"  (1.549 m)   Body mass index is 29.66 kg/m.  General appearance:  Normal Thyroid:  Symmetrical, normal in size, without palpable masses or nodularity. Respiratory  Auscultation:  Clear without wheezing or rhonchi Cardiovascular  Auscultation:  Regular rate, without rubs, murmurs or gallops  Edema/varicosities:  Not grossly evident Abdominal  Soft,nontender, without masses, guarding or rebound.  Liver/spleen:  No organomegaly noted  Hernia:  None appreciated  Skin  Inspection:  Grossly normal   Breasts: Examined lying and sitting.   Right: Without masses, retractions, discharge or axillary adenopathy.   Left: Without masses, retractions, discharge or axillary adenopathy. Lumpectomy scar and radiation changes Gentitourinary   Inguinal/mons:  Normal without inguinal adenopathy  External genitalia:  Labial fusion from clitoral hood to upper introitus   BUS/Urethra/Skene's glands:  Normal  Vagina:  Normal, atrophic changes  Cervix:  Normal, atrophic  Uterus:  Difficult to palpate but no gross masses or  tenderness  Adnexa/parametria:     Rt: Without masses or tenderness.   Lt: Without masses or tenderness.  Anus and perineum: Normal  Digital rectal exam: Normal sphincter tone without palpated masses or tenderness  Assessment/Plan:  79 y.o. G0 for breast and pelvic exam.  Well female exam with routine gynecological exam - Education provided on SBEs, importance of preventative screenings, current guidelines, high calcium diet, regular exercise, and multivitamin daily.   Malignant neoplasm of upper-outer quadrant of left female breast, unspecified estrogen receptor status ([place]) - lumpectomy and radiation, on Arimidex. Followed by oncology. Normal breast exam today.   Osteopenia of multiple sites - PCP follows this. Overdue for Bone Density. Taking Vitamin D supplement and stays active with house/yard work. She was going to the gym prior to the pandemic.  Follow up in 2 years       [place], 2:40 PM 02/14/2020

## 2020-02-15 ENCOUNTER — Other Ambulatory Visit: Payer: Self-pay | Admitting: Family Medicine

## 2020-02-15 DIAGNOSIS — H9312 Tinnitus, left ear: Secondary | ICD-10-CM

## 2020-02-15 DIAGNOSIS — R42 Dizziness and giddiness: Secondary | ICD-10-CM

## 2020-03-19 ENCOUNTER — Ambulatory Visit (INDEPENDENT_AMBULATORY_CARE_PROVIDER_SITE_OTHER): Payer: Medicare Other | Admitting: Family Medicine

## 2020-03-19 ENCOUNTER — Other Ambulatory Visit: Payer: Self-pay

## 2020-03-19 ENCOUNTER — Encounter: Payer: Self-pay | Admitting: Family Medicine

## 2020-03-19 VITALS — BP 151/74 | HR 74 | Temp 97.8°F | Ht 61.0 in | Wt 153.0 lb

## 2020-03-19 DIAGNOSIS — I10 Essential (primary) hypertension: Secondary | ICD-10-CM | POA: Diagnosis not present

## 2020-03-19 DIAGNOSIS — M8589 Other specified disorders of bone density and structure, multiple sites: Secondary | ICD-10-CM

## 2020-03-19 DIAGNOSIS — E7800 Pure hypercholesterolemia, unspecified: Secondary | ICD-10-CM | POA: Diagnosis not present

## 2020-03-19 MED ORDER — FLUCONAZOLE 150 MG PO TABS
150.0000 mg | ORAL_TABLET | Freq: Once | ORAL | 0 refills | Status: AC
Start: 2020-03-19 — End: 2020-03-19

## 2020-03-19 NOTE — Progress Notes (Signed)
BP (!) 151/74   Pulse 74   Temp 97.8 F (36.6 C)   Ht '5\' 1"'  (1.549 m)   Wt 153 lb (69.4 kg)   SpO2 97%   BMI 28.91 kg/m    Subjective:   Patient ID: Tracey Juarez, female    DOB: 07-18-1941, 79 y.o.   MRN: [id number]  HPI: Tracey Juarez is a 79 y.o. female presenting on 03/19/2020 for Medical Management of Chronic Issues   HPI  Osteoporosis/osteopenia Fractures or history of fracture: None in recent history Medication: Arimidex also for cancer and risedronate Duration of treatment: Almost 3 years Last bone density scan: Almost 3 years Last T score: -1.9 Suggest 800 international units of vitamin D daily and 1200 mg elemental calcium for osteopenia or osteoporosis.    Hyperlipidemia Patient is coming in for recheck of his hyperlipidemia. The patient is currently taking Crestor. They deny any issues with myalgias or history of liver damage from it. They deny any focal numbness or weakness or chest pain.   Hypertension Patient is currently on lisinopril, and their blood pressure today is 151/74 but she says at home it always runs in the 120s over 60s.. Patient denies any lightheadedness or dizziness. Patient denies headaches, blurred vision, chest pains, shortness of breath, or weakness. Denies any side effects from medication and is content with current medication.   Relevant past medical, surgical, family and social history reviewed and updated as indicated. Interim medical history since our last visit reviewed. Allergies and medications reviewed and updated.  Review of Systems  Constitutional: Negative for chills and fever.  Eyes: Negative for redness and visual disturbance.  Respiratory: Negative for chest tightness and shortness of breath.   Cardiovascular: Negative for chest pain and leg swelling.  Genitourinary: Negative for difficulty urinating and dysuria.  Musculoskeletal: Positive for arthralgias (Some arthritis but comes and goes). Negative for back  pain and gait problem.  Skin: Negative for rash.  Neurological: Negative for light-headedness and headaches.  Psychiatric/Behavioral: Negative for agitation and behavioral problems.  All other systems reviewed and are negative.   Per HPI unless specifically indicated above   Allergies as of 03/19/2020      Reactions   Ondansetron Swelling, Palpitations      Medication List       Accurate as of March 19, 2020 11:05 AM. If you have any questions, ask your nurse or doctor.        acetaminophen 325 MG tablet Commonly known as: TYLENOL Take 650 mg by mouth every 6 (six) hours as needed.   anastrozole 1 MG tablet Commonly known as: ARIMIDEX Take 1 tablet (1 mg total) by mouth daily.   Carbidopa-Levodopa ER 25-100 MG tablet controlled release Commonly known as: SINEMET CR Take 1 tablet by mouth 3 (three) times daily.   cetirizine 10 MG tablet Commonly known as: ZYRTEC Take 10 mg by mouth daily.   clopidogrel 75 MG tablet Commonly known as: PLAVIX Take 75 mg by mouth daily.   diphenhydrAMINE-zinc acetate cream Commonly known as: BENADRYL Apply 1 application topically 3 (three) times daily as needed for itching.   famotidine 10 MG tablet Commonly known as: PEPCID Take 10 mg by mouth daily.   Fiber Therapy 25 % Powd Generic drug: Psyllium Take by mouth.   Ginger Root 550 MG Caps Take by mouth.   lisinopril 10 MG tablet Commonly known as: ZESTRIL Take 1 tablet (10 mg total) by mouth daily.   Magnesium 250 MG Tabs Take  1 tablet by mouth daily.   meclizine 12.5 MG tablet Commonly known as: ANTIVERT TAKE 1 TO 2 TABLETS BY MOUTH THREE TIMES DAILY AS NEEDED FOR DIZZINESS   multivitamin capsule Take 1 capsule by mouth daily.   nystatin cream Commonly known as: MYCOSTATIN Apply 1 application topically 2 (two) times daily.   oxybutynin 5 MG 24 hr tablet Commonly known as: DITROPAN-XL Take 1 tablet (5 mg total) by mouth at bedtime.   pramipexole 0.5 MG  tablet Commonly known as: MIRAPEX Take 0.5 mg by mouth 3 (three) times daily.   pramipexole 0.25 MG tablet Commonly known as: MIRAPEX Take 2 tablets by mouth 3 (three) times daily.   Risedronate Sodium 35 MG Tbec Take 1 tablet by mouth once a week   rosuvastatin 10 MG tablet Commonly known as: CRESTOR Take 1 tablet (10 mg total) by mouth daily.   Vitamin D3 1.25 MG (50000 UT) Tabs Take by mouth. Taking 4,000 ui daily        Objective:   BP (!) 151/74   Pulse 74   Temp 97.8 F (36.6 C)   Ht '5\' 1"'  (1.549 m)   Wt 153 lb (69.4 kg)   SpO2 97%   BMI 28.91 kg/m   Wt Readings from Last 3 Encounters:  03/19/20 153 lb (69.4 kg)  02/14/20 157 lb (71.2 kg)  01/17/20 156 lb (70.8 kg)    Physical Exam Vitals and nursing note reviewed.  Constitutional:      General: She is not in acute distress.    Appearance: She is well-developed. She is not diaphoretic.  Eyes:     Conjunctiva/sclera: Conjunctivae normal.  Cardiovascular:     Rate and Rhythm: Normal rate and regular rhythm.     Heart sounds: Normal heart sounds. No murmur heard.   Pulmonary:     Effort: Pulmonary effort is normal. No respiratory distress.     Breath sounds: Normal breath sounds. No wheezing.  Musculoskeletal:        General: No tenderness. Normal range of motion.  Skin:    General: Skin is warm and dry.     Findings: No rash.  Neurological:     Mental Status: She is alert and oriented to person, place, and time.     Coordination: Coordination normal.  Psychiatric:        Behavior: Behavior normal.       Assessment & Plan:   Problem List Items Addressed This Visit      Cardiovascular and Mediastinum   Hypertension   Relevant Orders   CBC with Differential/Platelet   CMP14+EGFR     Musculoskeletal and Integument   Osteopenia   Relevant Orders   CBC with Differential/Platelet   DG Bone Density     Other   Pure hypercholesterolemia - Primary   Relevant Orders   Lipid panel       Patient's Parkinson's is doing a lot better since her deep brain stimulator therapy, she feels like she is doing great and can get around and her tremors are almost completely gone. Follow up plan: Return in about 4 months (around 07/19/2020), or if symptoms worsen or fail to improve, for Hypertension and cholesterol and osteopenia recheck.  Counseling provided for all of the vaccine components No orders of the defined types were placed in this encounter.   Caryl Pina, MD [facility] 03/19/2020, 11:05 AM

## 2020-03-20 LAB — CBC WITH DIFFERENTIAL/PLATELET
Basophils Absolute: 0 10*3/uL (ref 0.0–0.2)
Basos: 1 %
EOS (ABSOLUTE): 0.1 10*3/uL (ref 0.0–0.4)
Eos: 1 %
Hematocrit: 43.6 % (ref 34.0–46.6)
Hemoglobin: 14.2 g/dL (ref 11.1–15.9)
Immature Grans (Abs): 0 10*3/uL (ref 0.0–0.1)
Immature Granulocytes: 0 %
Lymphocytes Absolute: 1.4 10*3/uL (ref 0.7–3.1)
Lymphs: 20 %
MCH: 30.7 pg (ref 26.6–33.0)
MCHC: 32.6 g/dL (ref 31.5–35.7)
MCV: 94 fL (ref 79–97)
Monocytes Absolute: 0.6 10*3/uL (ref 0.1–0.9)
Monocytes: 9 %
Neutrophils Absolute: 4.7 10*3/uL (ref 1.4–7.0)
Neutrophils: 69 %
Platelets: 246 10*3/uL (ref 150–450)
RBC: 4.62 x10E6/uL (ref 3.77–5.28)
RDW: 13 % (ref 11.7–15.4)
WBC: 6.9 10*3/uL (ref 3.4–10.8)

## 2020-03-20 LAB — CMP14+EGFR
ALT: 8 IU/L (ref 0–32)
AST: 19 IU/L (ref 0–40)
Albumin/Globulin Ratio: 1.9 (ref 1.2–2.2)
Albumin: 4.6 g/dL (ref 3.7–4.7)
Alkaline Phosphatase: 79 IU/L (ref 48–121)
BUN/Creatinine Ratio: 17 (ref 12–28)
BUN: 13 mg/dL (ref 8–27)
Bilirubin Total: 0.4 mg/dL (ref 0.0–1.2)
CO2: 24 mmol/L (ref 20–29)
Calcium: 10 mg/dL (ref 8.7–10.3)
Chloride: 103 mmol/L (ref 96–106)
Creatinine, Ser: 0.77 mg/dL (ref 0.57–1.00)
GFR calc Af Amer: 86 mL/min/{1.73_m2} (ref >59)
GFR calc non Af Amer: 75 mL/min/{1.73_m2} (ref >59)
Globulin, Total: 2.4 g/dL (ref 1.5–4.5)
Glucose: 101 mg/dL — ABNORMAL HIGH (ref 65–99)
Potassium: 4.5 mmol/L (ref 3.5–5.2)
Sodium: 141 mmol/L (ref 134–144)
Total Protein: 7 g/dL (ref 6.0–8.5)

## 2020-03-20 LAB — LIPID PANEL
Chol/HDL Ratio: 3 ratio (ref 0.0–4.4)
Cholesterol, Total: 170 mg/dL (ref 100–199)
HDL: 57 mg/dL (ref >39)
LDL Chol Calc (NIH): 89 mg/dL (ref 0–99)
Triglycerides: 139 mg/dL (ref 0–149)
VLDL Cholesterol Cal: 24 mg/dL (ref 5–40)

## 2020-03-23 ENCOUNTER — Telehealth: Payer: Medicare Other

## 2020-03-30 ENCOUNTER — Ambulatory Visit: Payer: Medicare Other | Admitting: *Deleted

## 2020-03-30 DIAGNOSIS — E7800 Pure hypercholesterolemia, unspecified: Secondary | ICD-10-CM

## 2020-03-30 DIAGNOSIS — I10 Essential (primary) hypertension: Secondary | ICD-10-CM

## 2020-03-30 NOTE — Patient Instructions (Signed)
Plan:   CCM enrollment status changed to "previously enrolled" as per patient request on 03/30/20 to discontinue enrollment. Case closed to case management services in primary care home.   Chong Sicilian, BSN, RN-BC Embedded Chronic Care Manager [facility] Direct Dial: [phone]

## 2020-03-30 NOTE — Chronic Care Management (AMB) (Signed)
  Chronic Care Management   Initial Visit Note  03/30/2020 Name: Marche Hottenstein MRN: [id number] DOB: 01-01-1941  Referred by: Dettinger, Fransisca Kaufmann, MD Reason for referral : No chief complaint on file.   Tracey Juarez is a 79 y.o. year old female who is a primary care patient of Dettinger, Fransisca Kaufmann, MD. The CCM team was consulted for assistance with chronic disease management and care coordination needs related to hypertension, hyperlipidemia, Parkinson's, and hx of CVA.  I spoke with [name] by telephone today regarding management of her chronic medical conditions. She does not have any resource or CCM needs at this time and feels that her medical conditions are well controlled. She requested to be removed from the CCM program and was made aware that she can be re-enrolled at any time in the future if she desires.   Plan:   CCM enrollment status changed to "previously enrolled" as per patient request on 03/30/20 to discontinue enrollment. Case closed to case management services in primary care home.   Chong Sicilian, BSN, RN-BC Embedded Chronic Care Manager [facility] [place] [facility] Direct Dial: [phone]

## 2020-04-23 ENCOUNTER — Telehealth: Payer: Self-pay | Admitting: *Deleted

## 2020-04-23 MED ORDER — FLUTICASONE PROPIONATE 50 MCG/ACT NA SUSP
1.0000 | Freq: Two times a day (BID) | NASAL | 6 refills | Status: DC | PRN
Start: 2020-04-23 — End: 2021-09-09

## 2020-04-23 MED ORDER — FLUCONAZOLE 150 MG PO TABS
150.0000 mg | ORAL_TABLET | Freq: Once | ORAL | 0 refills | Status: AC
Start: 2020-04-23 — End: 2020-04-23

## 2020-04-23 NOTE — Telephone Encounter (Signed)
Please review -   Fluticasone 50 mcg was requested from pharmacy.  Please review the medication sent in and advise.

## 2020-04-23 NOTE — Telephone Encounter (Signed)
Fax from [facility] request for Fluticasone 50 mcg Not on current med list Last OV 03/19/20 Next Ov 08/20/19

## 2020-04-23 NOTE — Telephone Encounter (Signed)
Fluconazole was sent

## 2020-04-23 NOTE — Telephone Encounter (Signed)
Patient aware and verbalizes understanding. 

## 2020-04-23 NOTE — Telephone Encounter (Signed)
Sent in fluticasone for the patient

## 2020-05-02 ENCOUNTER — Ambulatory Visit (INDEPENDENT_AMBULATORY_CARE_PROVIDER_SITE_OTHER): Payer: Medicare Other | Admitting: Family Medicine

## 2020-05-02 ENCOUNTER — Other Ambulatory Visit: Payer: Self-pay | Admitting: Hematology

## 2020-05-02 ENCOUNTER — Other Ambulatory Visit: Payer: Self-pay

## 2020-05-02 ENCOUNTER — Encounter: Payer: Self-pay | Admitting: Family Medicine

## 2020-05-02 VITALS — BP 142/78 | HR 76 | Temp 99.7°F | Ht 61.0 in | Wt 153.0 lb

## 2020-05-02 DIAGNOSIS — H6121 Impacted cerumen, right ear: Secondary | ICD-10-CM

## 2020-05-02 DIAGNOSIS — C50412 Malignant neoplasm of upper-outer quadrant of left female breast: Secondary | ICD-10-CM

## 2020-05-02 NOTE — Patient Instructions (Signed)
Debrox over the counter drops will help soften wax if you feel more issues.    Earwax Buildup, Adult The ears produce a substance called earwax that helps keep bacteria out of the ear and protects the skin in the ear canal. Occasionally, earwax can build up in the ear and cause discomfort or hearing loss. What increases the risk? This condition is more likely to develop in people who:  Are female.  Are elderly.  Naturally produce more earwax.  Clean their ears often with cotton swabs.  Use earplugs often.  Use in-ear headphones often.  Wear hearing aids.  Have narrow ear canals.  Have earwax that is overly thick or sticky.  Have eczema.  Are dehydrated.  Have excess hair in the ear canal. What are the signs or symptoms? Symptoms of this condition include:  Reduced or muffled hearing.  A feeling of fullness in the ear or feeling that the ear is plugged.  Fluid coming from the ear.  Ear pain.  Ear itch.  Ringing in the ear.  Coughing.  An obvious piece of earwax that can be seen inside the ear canal. How is this diagnosed? This condition may be diagnosed based on:  Your symptoms.  Your medical history.  An ear exam. During the exam, your health care provider will look into your ear with an instrument called an otoscope. You may have tests, including a hearing test. How is this treated? This condition may be treated by:  Using ear drops to soften the earwax.  Having the earwax removed by a health care provider. The health care provider may: ? Flush the ear with water. ? Use an instrument that has a loop on the end (curette). ? Use a suction device.  Surgery to remove the wax buildup. This may be done in severe cases. Follow these instructions at home:   Take over-the-counter and prescription medicines only as told by your health care provider.  Do not put any objects, including cotton swabs, into your ear. You can clean the opening of your ear  canal with a washcloth or facial tissue.  Follow instructions from your health care provider about cleaning your ears. Do not over-clean your ears.  Drink enough fluid to keep your urine clear or pale yellow. This will help to thin the earwax.  Keep all follow-up visits as told by your health care provider. If earwax builds up in your ears often or if you use hearing aids, consider seeing your health care provider for routine, preventive ear cleanings. Ask your health care provider how often you should schedule your cleanings.  If you have hearing aids, clean them according to instructions from the manufacturer and your health care provider. Contact a health care provider if:  You have ear pain.  You develop a fever.  You have blood, pus, or other fluid coming from your ear.  You have hearing loss.  You have ringing in your ears that does not go away.  Your symptoms do not improve with treatment.  You feel like the room is spinning (vertigo). Summary  Earwax can build up in the ear and cause discomfort or hearing loss.  The most common symptoms of this condition include reduced or muffled hearing and a feeling of fullness in the ear or feeling that the ear is plugged.  This condition may be diagnosed based on your symptoms, your medical history, and an ear exam.  This condition may be treated by using ear drops to soften the  earwax or by having the earwax removed by a health care provider.  Do not put any objects, including cotton swabs, into your ear. You can clean the opening of your ear canal with a washcloth or facial tissue. This information is not intended to replace advice given to you by your health care provider. Make sure you discuss any questions you have with your health care provider. Document Revised: 07/10/2017 Document Reviewed: 10/08/2016 Elsevier Patient Education  2020 [employer].

## 2020-05-02 NOTE — Progress Notes (Signed)
Subjective: CC: ear was buildup PCP: Dettinger, Fransisca Kaufmann, MD  [id number] Tracey Juarez is a 79 y.o. female presenting to clinic today for:  1. Ear wax buildup Tracey Juarez reports feeling like there is something in her right ear for the last 2 days. Tracey Juarez denies fever, ear pain, discharge, or changes in hearting. Tracey Juarez has not tried anything for relief. Tracey Juarez denies nasal congestion or sore throat. Tracey Juarez does postnasal drip, that has improved with zyrtec. Tracey Juarez has been feeling some disequilibrium for the last few months for which Tracey Juarez is being evaluated for, Tracey Juarez is hoping that having her ear cleaned out will help with her symptoms. Tracey Juarez has been evaluated to rule out vertigo. Tracey Juarez has also recently had her deep brain stimulator lowered for her parkinson's disease but denies improvement. Tracey Juarez denies worsening of disequilibrium symptoms. Tracey Juarez denies chest pain, dizziness, shortness of breath, edema, one sided weakness, or palpitations. Tracey Juarez reports that her BP is well controlled at home with readings of 120s/60s.  Relevant past medical, surgical, family, and social history reviewed and updated as indicated.  Allergies and medications reviewed and updated.  Allergies  Allergen Reactions  . Ondansetron Swelling and Palpitations   Past Medical History:  Diagnosis Date  . Atrophic vaginitis   . Breast cancer in female [facility])   . Breast cancer of upper-outer quadrant of left female breast ([place]) 03/20/2015  . Detrusor instability   . Elevated cholesterol   . GERD (gastroesophageal reflux disease)   . Hot flashes   . Humerus head fracture, left, with routine healing, subsequent encounter 2000  . Hypertension   . Melanoma ([place])   . Mixed basal-squamous cell carcinoma    skin  . Parkinson disease ([place])   . Stroke [facility])    on Plavix, no deficits    Current Outpatient Medications:  .  acetaminophen (TYLENOL) 325 MG tablet, Take 650 mg by mouth every 6 (six) hours as needed., Disp: , Rfl:  .  anastrozole  (ARIMIDEX) 1 MG tablet, Take 1 tablet (1 mg total) by mouth daily., Disp: 90 tablet, Rfl: 3 .  Carbidopa-Levodopa ER (SINEMET CR) 25-100 MG tablet controlled release, Take 1 tablet by mouth 3 (three) times daily., Disp: , Rfl:  .  cetirizine (ZYRTEC) 10 MG tablet, Take 10 mg by mouth daily., Disp: , Rfl:  .  Cholecalciferol (VITAMIN D3) 1.25 MG (50000 UT) TABS, Take by mouth. Taking 4,000 ui daily, Disp: , Rfl:  .  clopidogrel (PLAVIX) 75 MG tablet, Take 75 mg by mouth daily., Disp: , Rfl:  .  diphenhydrAMINE-zinc acetate (BENADRYL) cream, Apply 1 application topically 3 (three) times daily as needed for itching., Disp: 28.4 g, Rfl: 0 .  famotidine (PEPCID) 10 MG tablet, Take 10 mg by mouth daily., Disp: , Rfl:  .  fluticasone (FLONASE) 50 MCG/ACT nasal spray, Place 1 spray into both nostrils 2 (two) times daily as needed for allergies or rhinitis., Disp: 16 g, Rfl: 6 .  Ginger, Zingiber officinalis, (GINGER ROOT) 550 MG CAPS, Take by mouth., Disp: , Rfl:  .  lisinopril (ZESTRIL) 10 MG tablet, Take 1 tablet (10 mg total) by mouth daily., Disp: 90 tablet, Rfl: 3 .  Magnesium 250 MG TABS, Take 1 tablet by mouth daily., Disp: , Rfl:  .  meclizine (ANTIVERT) 12.5 MG tablet, TAKE 1 TO 2 TABLETS BY MOUTH THREE TIMES DAILY AS NEEDED FOR DIZZINESS, Disp: 60 tablet, Rfl: 0 .  Multiple Vitamin (MULTIVITAMIN) capsule, Take 1 capsule by mouth daily.  , Disp: ,  Rfl:  .  nystatin cream (MYCOSTATIN), Apply 1 application topically 2 (two) times daily., Disp: 30 g, Rfl: 0 .  oxybutynin (DITROPAN-XL) 5 MG 24 hr tablet, Take 1 tablet (5 mg total) by mouth at bedtime., Disp: 90 tablet, Rfl: 3 .  pramipexole (MIRAPEX) 0.5 MG tablet, Take 0.5 mg by mouth 3 (three) times daily., Disp: , Rfl:  .  Psyllium (FIBER THERAPY) 25 % POWD, Take by mouth., Disp: , Rfl:  .  Risedronate Sodium 35 MG TBEC, Take 1 tablet by mouth once a week, Disp: 12 tablet, Rfl: 0 .  rosuvastatin (CRESTOR) 10 MG tablet, Take 1 tablet (10 mg total)  by mouth daily., Disp: 90 tablet, Rfl: 3 .  pramipexole (MIRAPEX) 0.25 MG tablet, Take 2 tablets by mouth 3 (three) times daily. , Disp: , Rfl:  Social History   Socioeconomic History  . Marital status: Married    Spouse name: Joneen Boers   . Number of children: 0  . Years of education: ged  . Highest education level: GED or equivalent  Occupational History  . Occupation: Retired  Tobacco Use  . Smoking status: Never Smoker  . Smokeless tobacco: Never Used  Vaping Use  . Vaping Use: Never used  Substance and Sexual Activity  . Alcohol use: No    Alcohol/week: 0.0 standard drinks  . Drug use: No  . Sexual activity: Never    Birth control/protection: Post-menopausal    Comment: 1st intercourse 66 yo-2 partners  Other Topics Concern  . Not on file  Social History Narrative  . Not on file   Social Determinants of Health   Financial Resource Strain: Low Risk   . Difficulty of Paying Living Expenses: Not hard at all  Food Insecurity: No Food Insecurity  . Worried About Charity fundraiser in the Last Year: Never true  . Ran Out of Food in the Last Year: Never true  Transportation Needs: No Transportation Needs  . Lack of Transportation (Medical): No  . Lack of Transportation (Non-Medical): No  Physical Activity: Inactive  . Days of Exercise per Week: 0 days  . Minutes of Exercise per Session: 0 min  Stress: No Stress Concern Present  . Feeling of Stress : Not at all  Social Connections: Socially Integrated  . Frequency of Communication with Friends and Family: More than three times a week  . Frequency of Social Gatherings with Friends and Family: Once a week  . Attends Religious Services: More than 4 times per year  . Active Member of Clubs or Organizations: Yes  . Attends Archivist Meetings: More than 4 times per year  . Marital Status: Married  Human resources officer Violence: Not At Risk  . Fear of Current or Ex-Partner: No  . Emotionally Abused: No  . Physically  Abused: No  . Sexually Abused: No   Family History  Problem Relation Age of Onset  . Hypertension Mother   . Heart attack Mother   . Heart attack Maternal Aunt   . Heart attack Maternal Uncle   . Breast cancer Neg Hx     Review of Systems  Per HPI.    Objective: Office vital signs reviewed. BP (!) 142/78   Pulse 76   Temp 99.7 F (37.6 C) (Temporal)   Ht '5\' 1"'  (1.549 m)   Wt 153 lb (69.4 kg)   BMI 28.91 kg/m   Physical Examination:  Physical Exam Vitals and nursing note reviewed.  Constitutional:      Appearance:  Normal appearance.  HENT:     Head: Normocephalic and atraumatic.     Right Ear: Tympanic membrane, ear canal and external ear normal.     Left Ear: Ear canal and external ear normal. There is impacted cerumen.     Nose: Nose normal. No congestion or rhinorrhea.     Mouth/Throat:     Mouth: Mucous membranes are moist.     Pharynx: Oropharynx is clear. No oropharyngeal exudate or posterior oropharyngeal erythema.  Eyes:     Extraocular Movements: Extraocular movements intact.     Pupils: Pupils are equal, round, and reactive to light.  Cardiovascular:     Rate and Rhythm: Normal rate and regular rhythm.     Heart sounds: Normal heart sounds. No murmur heard.   Pulmonary:     Effort: Pulmonary effort is normal. No respiratory distress.     Breath sounds: Normal breath sounds.  Musculoskeletal:     Right lower leg: No edema.     Left lower leg: No edema.  Skin:    General: Skin is warm and dry.     Capillary Refill: Capillary refill takes less than 2 seconds.  Neurological:     General: No focal deficit present.     Mental Status: Tracey Juarez is alert and oriented to person, place, and time.  Psychiatric:        Mood and Affect: Mood normal.        Behavior: Behavior normal.      Results for orders placed or performed in visit on 03/19/20  CBC with Differential/Platelet  Result Value Ref Range   WBC 6.9 3.4 - 10.8 x10E3/uL   RBC 4.62 3.77 - 5.28  x10E6/uL   Hemoglobin 14.2 11.1 - 15.9 g/dL   Hematocrit 43.6 34.0 - 46.6 %   MCV 94 79 - 97 fL   MCH 30.7 26.6 - 33.0 pg   MCHC 32.6 31 - 35 g/dL   RDW 13.0 11.7 - 15.4 %   Platelets 246 150 - 450 x10E3/uL   Neutrophils 69 Not Estab. %   Lymphs 20 Not Estab. %   Monocytes 9 Not Estab. %   Eos 1 Not Estab. %   Basos 1 Not Estab. %   Neutrophils Absolute 4.7 1 - 7 x10E3/uL   Lymphocytes Absolute 1.4 0 - 3 x10E3/uL   Monocytes Absolute 0.6 0 - 0 x10E3/uL   EOS (ABSOLUTE) 0.1 0.0 - 0.4 x10E3/uL   Basophils Absolute 0.0 0 - 0 x10E3/uL   Immature Granulocytes 0 Not Estab. %   Immature Grans (Abs) 0.0 0.0 - 0.1 x10E3/uL  CMP14+EGFR  Result Value Ref Range   Glucose 101 (H) 65 - 99 mg/dL   BUN 13 8 - 27 mg/dL   Creatinine, Ser 0.77 0.57 - 1.00 mg/dL   GFR calc non Af Amer 75 >59 mL/min/1.73   GFR calc Af Amer 86 >59 mL/min/1.73   BUN/Creatinine Ratio 17 12 - 28   Sodium 141 134 - 144 mmol/L   Potassium 4.5 3.5 - 5.2 mmol/L   Chloride 103 96 - 106 mmol/L   CO2 24 20 - 29 mmol/L   Calcium 10.0 8.7 - 10.3 mg/dL   Total Protein 7.0 6.0 - 8.5 g/dL   Albumin 4.6 3.7 - 4.7 g/dL   Globulin, Total 2.4 1.5 - 4.5 g/dL   Albumin/Globulin Ratio 1.9 1.2 - 2.2   Bilirubin Total 0.4 0.0 - 1.2 mg/dL   Alkaline Phosphatase 79 48 - 121 IU/L  AST 19 0 - 40 IU/L   ALT 8 0 - 32 IU/L  Lipid panel  Result Value Ref Range   Cholesterol, Total 170 100 - 199 mg/dL   Triglycerides 139 0 - 149 mg/dL   HDL 57 >39 mg/dL   VLDL Cholesterol Cal 24 5 - 40 mg/dL   LDL Chol Calc (NIH) 89 0 - 99 mg/dL   Chol/HDL Ratio 3.0 0.0 - 4.4 ratio     Assessment/ Plan: Yeva was seen today for ear wax buildup.  Diagnoses and all orders for this visit:  Excessive ear wax, right Patient declined ear irrigation today. Right ear cerumen was located in the canal and was removed with a curette, patient tolerated well. Right TM normal with no signs of infection today. May use Debrox OTC drops is Tracey Juarez continues to have  issues with ear wax buildup. Follow up for new or worsening symptoms, or if symptoms continue.   The above assessment and management plan was discussed with the patient. The patient verbalized understanding of and has agreed to the management plan. Patient is aware to call the clinic if symptoms persist or worsen. Patient is aware when to return to the clinic for a follow-up visit. Patient educated on when it is appropriate to go to the emergency department.   Marjorie Smolder, FNP-C [facility] [address] [place], [place] [address] [phone]

## 2020-05-04 ENCOUNTER — Ambulatory Visit: Payer: Medicare Other | Admitting: Hematology

## 2020-05-04 ENCOUNTER — Other Ambulatory Visit: Payer: Medicare Other

## 2020-05-04 ENCOUNTER — Inpatient Hospital Stay: Payer: Medicare Other

## 2020-05-04 ENCOUNTER — Inpatient Hospital Stay: Payer: Medicare Other | Admitting: Hematology

## 2020-05-06 ENCOUNTER — Other Ambulatory Visit: Payer: Self-pay | Admitting: Family Medicine

## 2020-05-06 DIAGNOSIS — R42 Dizziness and giddiness: Secondary | ICD-10-CM

## 2020-05-06 DIAGNOSIS — H9312 Tinnitus, left ear: Secondary | ICD-10-CM

## 2020-05-30 ENCOUNTER — Other Ambulatory Visit: Payer: Medicare Other

## 2020-05-30 ENCOUNTER — Ambulatory Visit: Payer: Medicare Other | Admitting: Hematology

## 2020-07-11 ENCOUNTER — Other Ambulatory Visit: Payer: Self-pay

## 2020-07-11 ENCOUNTER — Ambulatory Visit
Admission: RE | Admit: 2020-07-11 | Discharge: 2020-07-11 | Disposition: A | Discharge status: Home / Self Care | Payer: Medicare Other | Source: Ambulatory Visit | Attending: Hematology | Admitting: Hematology

## 2020-07-11 DIAGNOSIS — C50412 Malignant neoplasm of upper-outer quadrant of left female breast: Secondary | ICD-10-CM

## 2020-07-11 HISTORY — DX: Personal history of irradiation: Z92.3

## 2020-07-16 ENCOUNTER — Inpatient Hospital Stay: Payer: Medicare Other

## 2020-07-16 ENCOUNTER — Inpatient Hospital Stay (HOSPITAL_BASED_OUTPATIENT_CLINIC_OR_DEPARTMENT_OTHER): Payer: Medicare Other | Admitting: Hematology

## 2020-07-16 DIAGNOSIS — I10 Essential (primary) hypertension: Secondary | ICD-10-CM

## 2020-07-16 DIAGNOSIS — C50412 Malignant neoplasm of upper-outer quadrant of left female breast: Secondary | ICD-10-CM

## 2020-07-19 ENCOUNTER — Ambulatory Visit (INDEPENDENT_AMBULATORY_CARE_PROVIDER_SITE_OTHER): Payer: Medicare Other | Admitting: Family Medicine

## 2020-07-19 ENCOUNTER — Other Ambulatory Visit: Payer: Self-pay

## 2020-07-19 ENCOUNTER — Encounter: Payer: Self-pay | Admitting: Family Medicine

## 2020-07-19 VITALS — BP 153/65 | HR 70 | Ht 61.0 in | Wt 150.0 lb

## 2020-07-19 DIAGNOSIS — N3281 Overactive bladder: Secondary | ICD-10-CM | POA: Diagnosis not present

## 2020-07-19 DIAGNOSIS — I10 Essential (primary) hypertension: Secondary | ICD-10-CM

## 2020-07-19 DIAGNOSIS — E7800 Pure hypercholesterolemia, unspecified: Secondary | ICD-10-CM | POA: Diagnosis not present

## 2020-07-19 DIAGNOSIS — M8589 Other specified disorders of bone density and structure, multiple sites: Secondary | ICD-10-CM | POA: Diagnosis not present

## 2020-07-19 LAB — LIPID PANEL
Chol/HDL Ratio: 2.5 ratio (ref 0.0–4.4)
Cholesterol, Total: 148 mg/dL (ref 100–199)
HDL: 59 mg/dL (ref >39)
LDL Chol Calc (NIH): 70 mg/dL (ref 0–99)
Triglycerides: 108 mg/dL (ref 0–149)
VLDL Cholesterol Cal: 19 mg/dL (ref 5–40)

## 2020-07-19 LAB — CMP14+EGFR
ALT: 7 IU/L (ref 0–32)
AST: 19 IU/L (ref 0–40)
Albumin/Globulin Ratio: 2 (ref 1.2–2.2)
Albumin: 4.4 g/dL (ref 3.7–4.7)
Alkaline Phosphatase: 79 IU/L (ref 44–121)
BUN/Creatinine Ratio: 15 (ref 12–28)
BUN: 11 mg/dL (ref 8–27)
Bilirubin Total: 0.5 mg/dL (ref 0.0–1.2)
CO2: 22 mmol/L (ref 20–29)
Calcium: 9.9 mg/dL (ref 8.7–10.3)
Chloride: 101 mmol/L (ref 96–106)
Creatinine, Ser: 0.72 mg/dL (ref 0.57–1.00)
GFR calc Af Amer: 93 mL/min/{1.73_m2} (ref >59)
GFR calc non Af Amer: 80 mL/min/{1.73_m2} (ref >59)
Globulin, Total: 2.2 g/dL (ref 1.5–4.5)
Glucose: 98 mg/dL (ref 65–99)
Potassium: 4 mmol/L (ref 3.5–5.2)
Sodium: 140 mmol/L (ref 134–144)
Total Protein: 6.6 g/dL (ref 6.0–8.5)

## 2020-07-19 MED ORDER — OXYBUTYNIN CHLORIDE ER 10 MG PO TB24: 10.0000 mg | ORAL_TABLET | Freq: Every day | ORAL | 3 refills | Status: DC

## 2020-07-19 MED ORDER — LISINOPRIL 10 MG PO TABS: 10.0000 mg | ORAL_TABLET | Freq: Every day | ORAL | 3 refills | Status: DC

## 2020-07-19 MED ORDER — ROSUVASTATIN CALCIUM 10 MG PO TABS
10.0000 mg | ORAL_TABLET | Freq: Every day | ORAL | 3 refills | Status: DC
Start: 2020-07-19 — End: 2021-09-18

## 2020-07-19 MED ORDER — RISEDRONATE SODIUM 35 MG PO TBEC
1.0000 | DELAYED_RELEASE_TABLET | ORAL | 0 refills | Status: DC
Start: 2020-07-19 — End: 2021-09-18

## 2020-07-19 NOTE — Progress Notes (Signed)
BP (!) 153/65   Pulse 70   Ht _0  (1.549 m)   Wt 150 lb (68 kg)   SpO2 97%   BMI 28.34 kg/m    Subjective:   Patient ID: Tracey Juarez, female    DOB: 26-Feb-1941, 79 y.o.   MRN: [id number]  HPI: Tracey Juarez is a 79 y.o. female presenting on 07/19/2020 for Medical Management of Chronic Issues   HPI Suggest 800 international units of vitamin D daily and 1200 mg elemental calcium for osteopenia or osteoporosis.,  Continue Fosamax.  Overactive bladder Patient is coming in for recheck of overactive bladder and says the difference helping but she feels like she needs some stronger, she has been diagnosed with detrusor instability.  Hypertension Patient is currently on lisinopril, and their blood pressure today is 153/65. Patient denies any lightheadedness or dizziness. Patient denies headaches, blurred vision, chest pains, shortness of breath, or weakness. Denies any side effects from medication and is content with current medication.   Hyperlipidemia Patient is coming in for recheck of his hyperlipidemia. The patient is currently taking Crestor. They deny any issues with myalgias or history of liver damage from it. They deny any focal numbness or weakness or chest pain.   Relevant past medical, surgical, family and social history reviewed and updated as indicated. Interim medical history since our last visit reviewed. Allergies and medications reviewed and updated.  Review of Systems  Constitutional: Negative for chills and fever.  HENT: Negative for congestion, ear discharge and ear pain.   Eyes: Negative for redness and visual disturbance.  Respiratory: Negative for chest tightness and shortness of breath.   Cardiovascular: Negative for chest pain and leg swelling.  Genitourinary: Negative for difficulty urinating and dysuria.  Musculoskeletal: Negative for back pain and gait problem.  Skin: Negative for rash.  Neurological: Negative for light-headedness and  headaches.  Psychiatric/Behavioral: Negative for agitation and behavioral problems.  All other systems reviewed and are negative.   Per HPI unless specifically indicated above   Allergies as of 07/19/2020      Reactions   Ondansetron Swelling, Palpitations      Medication List       Accurate as of July 19, 2020  9:55 AM. If you have any questions, ask your nurse or doctor.        acetaminophen 325 MG tablet Commonly known as: TYLENOL Take 650 mg by mouth every 6 (six) hours as needed.   anastrozole 1 MG tablet Commonly known as: ARIMIDEX Take 1 tablet (1 mg total) by mouth daily.   Carbidopa-Levodopa ER 25-100 MG tablet controlled release Commonly known as: SINEMET CR Take 1 tablet by mouth 3 (three) times daily.   cetirizine 10 MG tablet Commonly known as: ZYRTEC Take 10 mg by mouth daily.   clopidogrel 75 MG tablet Commonly known as: PLAVIX Take 75 mg by mouth daily.   diphenhydrAMINE-zinc acetate cream Commonly known as: BENADRYL Apply 1 application topically 3 (three) times daily as needed for itching.   famotidine 10 MG tablet Commonly known as: PEPCID Take 10 mg by mouth daily.   Fiber Therapy 25 % Powd Generic drug: Psyllium Take by mouth.   fluticasone 50 MCG/ACT nasal spray Commonly known as: FLONASE Place 1 spray into both nostrils 2 (two) times daily as needed for allergies or rhinitis.   Ginger Root 550 MG Caps Take by mouth.   lisinopril 10 MG tablet Commonly known as: ZESTRIL Take 1 tablet (10 mg total) by mouth  daily.   Magnesium 250 MG Tabs Take 1 tablet by mouth daily.   meclizine 12.5 MG tablet Commonly known as: ANTIVERT TAKE 1 TO 2 TABLETS BY MOUTH THREE TIMES DAILY AS NEEDED FOR DIZZINESS   multivitamin capsule Take 1 capsule by mouth daily.   nystatin cream Commonly known as: MYCOSTATIN Apply 1 application topically 2 (two) times daily.   oxybutynin 10 MG 24 hr tablet Commonly known as: DITROPAN-XL Take 1 tablet  (10 mg total) by mouth at bedtime. What changed:   medication strength  how much to take Changed by: Worthy Rancher, MD   pramipexole 0.5 MG tablet Commonly known as: MIRAPEX Take 0.5 mg by mouth 3 (three) times daily.   pramipexole 0.25 MG tablet Commonly known as: MIRAPEX Take 2 tablets by mouth 3 (three) times daily.   Risedronate Sodium 35 MG Tbec Take 1 tablet (35 mg total) by mouth once a week.   rosuvastatin 10 MG tablet Commonly known as: CRESTOR Take 1 tablet (10 mg total) by mouth daily.   Vitamin D3 1.25 MG (50000 UT) Tabs Take by mouth. Taking 4,000 ui daily        Objective:   BP (!) 153/65   Pulse 70   Ht _0  (1.549 m)   Wt 150 lb (68 kg)   SpO2 97%   BMI 28.34 kg/m   Wt Readings from Last 3 Encounters:  07/19/20 150 lb (68 kg)  05/02/20 153 lb (69.4 kg)  03/19/20 153 lb (69.4 kg)    Physical Exam Vitals and nursing note reviewed.  Constitutional:      General: She is not in acute distress.    Appearance: She is well-developed and well-nourished. She is not diaphoretic.  Eyes:     Extraocular Movements: EOM normal.     Conjunctiva/sclera: Conjunctivae normal.  Cardiovascular:     Rate and Rhythm: Normal rate and regular rhythm.     Pulses: Intact distal pulses.     Heart sounds: Normal heart sounds. No murmur heard.   Pulmonary:     Effort: Pulmonary effort is normal. No respiratory distress.     Breath sounds: Normal breath sounds. No wheezing.  Musculoskeletal:        General: No tenderness or edema. Normal range of motion.  Skin:    General: Skin is warm and dry.     Findings: No rash.  Neurological:     Mental Status: She is alert and oriented to person, place, and time.     Coordination: Coordination normal.  Psychiatric:        Mood and Affect: Mood and affect normal.        Behavior: Behavior normal.       Assessment & Plan:   Problem List Items Addressed This Visit      Cardiovascular and Mediastinum    Hypertension   Relevant Medications   lisinopril (ZESTRIL) 10 MG tablet   rosuvastatin (CRESTOR) 10 MG tablet   Other Relevant Orders   CMP14+EGFR     Musculoskeletal and Integument   Osteopenia     Genitourinary   Detrusor instability   Relevant Medications   oxybutynin (DITROPAN-XL) 10 MG 24 hr tablet     Other   Pure hypercholesterolemia - Primary   Relevant Medications   lisinopril (ZESTRIL) 10 MG tablet   rosuvastatin (CRESTOR) 10 MG tablet   Other Relevant Orders   CMP14+EGFR   Lipid panel    Other Visit Diagnoses    Essential hypertension  Relevant Medications   lisinopril (ZESTRIL) 10 MG tablet   rosuvastatin (CRESTOR) 10 MG tablet   Other Relevant Orders   CMP14+EGFR      Allowing permissive hypertension, will do blood work today, increase her oxybutynin from 5 to 10 mg to see if it helps more with her bladder instability. Follow up plan: Return in about 4 months (around 11/17/2020), or if symptoms worsen or fail to improve, for Hypertension and hyperlipidemia.  Counseling provided for all of the vaccine components Orders Placed This Encounter  Procedures  . CMP14+EGFR  . Lipid panel    Caryl Pina, MD [facility] 07/19/2020, 9:55 AM

## 2020-08-06 ENCOUNTER — Other Ambulatory Visit: Payer: Self-pay | Admitting: Family Medicine

## 2020-08-06 DIAGNOSIS — H9312 Tinnitus, left ear: Secondary | ICD-10-CM

## 2020-08-06 DIAGNOSIS — R42 Dizziness and giddiness: Secondary | ICD-10-CM

## 2020-08-17 ENCOUNTER — Telehealth: Payer: Self-pay | Admitting: Pediatrics

## 2020-08-17 ENCOUNTER — Telehealth: Payer: Self-pay | Admitting: Hematology

## 2020-08-17 NOTE — Telephone Encounter (Signed)
Error

## 2020-08-17 NOTE — Telephone Encounter (Signed)
Called pt per 1/7 sch msg - no answer and unable to leave message.

## 2020-08-24 ENCOUNTER — Ambulatory Visit: Payer: Medicare Other | Admitting: Hematology

## 2020-08-24 ENCOUNTER — Other Ambulatory Visit: Payer: Medicare Other

## 2020-10-03 NOTE — Progress Notes (Signed)
[facility]   [phone] [phone]   Clinic Follow up Note   Patient Care Team: Dettinger, Fransisca Kaufmann, MD as PCP - General (Family Medicine) Fontaine, Belinda Block, MD (Inactive) as Consulting Physician (Gynecology) Erroll Luna, MD as Consulting Physician (General Surgery) Truitt Merle, MD as Consulting Physician (Hematology) Thea Silversmith, MD as Consulting Physician (Radiation Oncology) Mauro Kaufmann, RN as Registered Nurse Rockwell Germany, RN as Registered Nurse Jake Shark, Johny Blamer, NP as Nurse Practitioner (Nurse Practitioner) Ellwood Dense., MD as Referring Physician (Neurology)  Date of Service:  10/04/2020  CHIEF COMPLAINT: Follow Up left breast cancer  SUMMARY OF ONCOLOGIC HISTORY: Oncology History Overview Note  Breast cancer of upper-outer quadrant of left female breast [facility])   Staging form: Breast, AJCC 7th Edition     Clinical stage from 03/19/2015: Stage IIA (T2, N0, M0) - Signed by Truitt Merle, MD on 03/27/2015     Pathologic stage from 04/27/2015: Stage IA (T1c, N0, cM0) - Signed by Enid Cutter, MD on 05/01/2015       Staging comments: Staged on final surgical specimen by Dr. Donato Heinz.        Breast cancer of upper-outer quadrant of left female breast ([place])  03/06/2015 Mammogram   Hypoechoic area in the left breast 1:00 position, 3 cm from the nipple, measuring 1.9 x 1.1 x 1.2 cm, corresponding to an area of architectural distortion on mammogram.   03/19/2015 Initial Biopsy   Left breast core needle biopsy showed invasive lobular carcinoma, grade 1-2, lobular carcinoma in situ. ER 100% positive, PR 50% positive, HER-2 negative, Ki-67 5%.   03/22/2015 Breast MRI   2.1 x 1.2 x 1.7 cm irregular enhancement within the upper outer left breast compatible with biopsy-proven malignancy. No other abnormalities.   03/22/2015 Clinical Stage   Stage IIA: T2 N0   04/25/2015 Definitive Surgery   Left breast lumpectomy / SLNB: invasive lobular  carcinoma, grade 1, margins were negative. LCIS; 3 sentinel lymph nodes were negative (0/3), HER2/neu repeated and negative   04/25/2015 Pathologic Stage   Stage IA: T1c N0   04/25/2015 Oncotype testing   Recurrence score 15, which predicts 10% 10 year risk of distant recurrence with tamoxifen alone.   06/06/2015 - 06/27/2015 Radiation Therapy   Adjuvant RT: Left breast 42.72 Gy over 16 fractions   08/12/2015 -  Anti-estrogen oral therapy   Anastrozole 1 mg daily.   09/20/2015 Survivorship   Survivorship care plan completed and mailed to patient in lieu of in person visit at pt request.   09/24/2016 Imaging   DEXA ASSESSMENT: The probability of a major osteoporotic fracture is 19.3% within the next ten years The probability of a hip fracture is 4.4% within the next ten years [facility] Radiology AP Spine L1-L3 09/24/2016 74.3 [age] Osteopenia -1.3 1.019 g/cm2 -12.2% * AP Spine L1-L3 11/30/2013 72.5 years Normal -0.2 1.161 g/cm2 6.0% * AP Spine L1-L3 01/29/2011 68.7 years Normal -0.7 1.095 g/cm2 -7.7% * AP Spine L1-L3 10/13/2005 64.4 [age] Normal 0.0 1.186 g/cm2 - DualFemur Neck Left 09/24/2016 74.3 [age] Osteopenia -2.0 0.762 g/cm2 -6.6% * DualFemur Neck Left 11/30/2013 72.5 years Osteopenia -1.6 0.816 g/cm2 2.5% DualFemur Neck Left 01/29/2011 68.7 years Osteopenia -1.7 0.796 g/cm2 -3.5% DualFemur Neck Left 10/13/2005 64.4 [age] Osteopenia -1.5 0.825 g/cm2 - The BMD measured at Femur Neck Left is 0.762 g/cm2 with a T-score of -2.0. This patient is considered osteopenic according to [facility]) criteria.    04/15/2017 Imaging  DIAG  breast TOMO bilateral FINDINGS: The left lumpectomy site is stable. Scarring in the medial left breast is unchanged for many years with an adjacent dystrophic calcification. No other suspicious findings or changes.  Mammographic images were processed with CAD.  IMPRESSION: No mammographic evidence of malignancy.   04/26/2018  Mammogram   04/26/2018 Mammogram IMPRESSION: 1. No mammographic evidence of malignancy in either breast. 2. Stable left breast post lumpectomy changes      CURRENT THERAPY:  Anastrozole 1 mg once daily, started on 06/11/2016  INTERVAL HISTORY:  Tracey Juarez is here for a follow up of left breast cancer. She presents to the clinic alone. She was last seein in 04/2019, she has rescheduled her appointment multiple times.  She has brain surgery for her Parkinson's last year, and tremor is much improved, she is still on meds.  She run out anastrozole one month ago She denies any pain or other concerns. She uses a Furniture conservator/restorer, functions well at home   All other systems were reviewed with the patient and are negative.  MEDICAL HISTORY:  Past Medical History:  Diagnosis Date  . Atrophic vaginitis   . Breast cancer in female [facility])   . Breast cancer of upper-outer quadrant of left female breast ([place]) 03/20/2015  . Detrusor instability   . Elevated cholesterol   . GERD (gastroesophageal reflux disease)   . Hot flashes   . Humerus head fracture, left, with routine healing, subsequent encounter 2000  . Hypertension   . Melanoma ([place])   . Mixed basal-squamous cell carcinoma    skin  . Parkinson disease ([place])   . Personal history of radiation therapy   . Stroke ([place])    on Plavix, no deficits    SURGICAL HISTORY: Past Surgical History:  Procedure Laterality Date  . BRAIN SURGERY    . BREAST LUMPECTOMY Left 04/2015   radiation  . BREAST LUMPECTOMY WITH RADIOACTIVE SEED AND SENTINEL LYMPH NODE BIOPSY Left 04/25/2015   Procedure: LEFT BREAST PARTIAL MASTECTOMY WITH RADIOACTIVE SEED AND SENTINEL LYMPH NODE MAPPING;  Surgeon: Erroll Luna, MD;  Location: [place];  Service: General;  Laterality: Left;  . FOOT SURGERY  2004  . KNEE SURGERY  2007  . skin cancer excised    . TONSILLECTOMY      I have reviewed the social history and family history with the patient and  they are unchanged from previous note.  ALLERGIES:  is allergic to ondansetron.  MEDICATIONS:  Current Outpatient Medications  Medication Sig Dispense Refill  . acetaminophen (TYLENOL) 325 MG tablet Take 650 mg by mouth every 6 (six) hours as needed.    Marland Kitchen anastrozole (ARIMIDEX) 1 MG tablet Take 1 tablet (1 mg total) by mouth daily. 90 tablet 2  . Carbidopa-Levodopa ER (SINEMET CR) 25-100 MG tablet controlled release Take 1 tablet by mouth 3 (three) times daily.    . cetirizine (ZYRTEC) 10 MG tablet Take 10 mg by mouth daily.    . Cholecalciferol (VITAMIN D3) 1.25 MG (50000 UT) TABS Take by mouth. Taking 4,000 ui daily    . clopidogrel (PLAVIX) 75 MG tablet Take 75 mg by mouth daily.    . diphenhydrAMINE-zinc acetate (BENADRYL) cream Apply 1 application topically 3 (three) times daily as needed for itching. 28.4 g 0  . famotidine (PEPCID) 10 MG tablet Take 10 mg by mouth daily.    . fluticasone (FLONASE) 50 MCG/ACT nasal spray Place 1 spray into both nostrils 2 (two) times daily as needed  for allergies or rhinitis. 16 g 6  . Ginger, Zingiber officinalis, (GINGER ROOT) 550 MG CAPS Take by mouth.    Marland Kitchen lisinopril (ZESTRIL) 10 MG tablet Take 1 tablet (10 mg total) by mouth daily. 90 tablet 3  . Magnesium 250 MG TABS Take 1 tablet by mouth daily.    . meclizine (ANTIVERT) 12.5 MG tablet TAKE 1 TO 2 TABLETS BY MOUTH THREE TIMES DAILY AS NEEDED FOR DIZZINESS 60 tablet 0  . Multiple Vitamin (MULTIVITAMIN) capsule Take 1 capsule by mouth daily.      Marland Kitchen nystatin cream (MYCOSTATIN) Apply 1 application topically 2 (two) times daily. 30 g 0  . oxybutynin (DITROPAN-XL) 10 MG 24 hr tablet Take 1 tablet (10 mg total) by mouth at bedtime. 90 tablet 3  . Psyllium (FIBER THERAPY) 25 % POWD Take by mouth.    . Risedronate Sodium 35 MG TBEC Take 1 tablet (35 mg total) by mouth once a week. 12 tablet 0  . rosuvastatin (CRESTOR) 10 MG tablet Take 1 tablet (10 mg total) by mouth daily. 90 tablet 3   No current  facility-administered medications for this visit.    PHYSICAL EXAMINATION: ECOG PERFORMANCE STATUS: 2 - Symptomatic, <50% confined to bed  Vitals:   10/04/20 1017  BP: (!) 165/71  Pulse: 77  Resp: 16  Temp: 98.2 F (36.8 C)  SpO2: 100%   Filed Weights   10/04/20 1017  Weight: 148 lb 8 oz (67.4 kg)    GENERAL:alert, no distress and comfortable SKIN: skin color, texture, turgor are normal, no rashes or significant lesions EYES: normal, Conjunctiva are pink and non-injected, sclera clear NECK: supple, thyroid normal size, non-tender, without nodularity LYMPH:  no palpable lymphadenopathy in the cervical, axillary  LUNGS: clear to auscultation and percussion with normal breathing effort HEART: regular rate & rhythm and no murmurs and no lower extremity edema ABDOMEN:abdomen soft, non-tender and normal bowel sounds Musculoskeletal:no cyanosis of digits and no clubbing  NEURO: alert & oriented x 3 with fluent speech, no focal motor/sensory deficits Breasts: S/p left lumpectomy: surgical incision healed well. No palpable mass, nodules or adenopathy bilaterally. Right breast exam benign.  LABORATORY DATA:  I have reviewed the data as listed CBC Latest Ref Rng & Units 10/04/2020 03/19/2020 11/17/2019  WBC 4.0 - 10.5 K/uL 6.0 6.9 4.9  Hemoglobin 12.0 - 15.0 g/dL 13.9 14.2 13.1  Hematocrit 36.0 - 46.0 % 43.4 43.6 40.9  Platelets 150 - 400 K/uL 225 246 226     CMP Latest Ref Rng & Units 10/04/2020 07/19/2020 03/19/2020  Glucose 70 - 99 mg/dL 100(H) 98 101(H)  BUN 8 - 23 mg/dL _0 Creatinine 0.44 - 1.00 mg/dL 0.82 0.72 0.77  Sodium 135 - 145 mmol/L 142 140 141  Potassium 3.5 - 5.1 mmol/L 3.9 4.0 4.5  Chloride 98 - 111 mmol/L 107 101 103  CO2 22 - 32 mmol/L _1 Calcium 8.9 - 10.3 mg/dL 9.1 9.9 10.0  Total Protein 6.5 - 8.1 g/dL 7.5 6.6 7.0  Total Bilirubin 0.3 - 1.2 mg/dL 0.6 0.5 0.4  Alkaline Phos 38 - 126 U/L 70 79 79  AST 15 - 41 U/L _2 ALT 0 - 44 U/L <_3 RADIOGRAPHIC STUDIES: I have personally reviewed the radiological images as listed and agreed with the findings in the report. No results found.   ASSESSMENT & PLAN:  Tracey Juarez is a 80 y.o. female  with   1. Left breast invasive lobular carcinoma, pT1cN0M0, stage IA, ER/PR positive, HER-2 negative, Ki-67 5%, Oncotype low risk -She was diagnosed 03/2015. She is s/p left lumpectomy and adjuvant radiation.HerOncotype RS of 15. Adjuvant Chemotherapy was not recommended. -She is currently on adjuvant anastrozolesince 06/2016. Shehas been toleratingwell withmildhot flash and leg cramps, improved lately. Will continue anastrozole for now, plan for total 5 years. -she run out of anastrozole a month ago, I refilled today, she will restart, until the end of this year  -She is clinically stable, lab reviewed, exam was unremarkable, no clinical concern for recurrence -We discussed the risk of late recurrence, will continue surveillance.  Next mammogram due in December 2022 -Follow-up in 1 year   2. Osteopenia -She had a bone density scan done in April 2015, which showed osteopenia with T score of -1.9 -I encouraged her to continue vitamin D and calcium  -We previously discussed thatanastrozolemay potentially make her osteopenia worse. -She is due for DEXA with her PCP.  3. Parkinson disease, hypertension -She'll continue follow-up with her primary care physician -She is overall physically adequate to ambulate herself.  -She underwent Deep brain surgery with Dr. Salomon Fick at [facility] on 07/01/19 and her hand tremor has much improved    Plan -She is clinically stable -continue anastrozole until the end of 2022, refilled today  -Mammogram in 07/2021 -Lab and f/u in 1 year   No problem-specific Assessment & Plan notes found for this encounter.   Orders Placed This Encounter  Procedures  . MM Digital Screening    Standing Status:   Future    Standing  Expiration Date:   10/04/2021    Order Specific Question:   Reason for Exam (SYMPTOM  OR DIAGNOSIS REQUIRED)    Answer:   screening    Order Specific Question:   Preferred imaging location?    Answer:   [facility]   All questions were answered. The patient knows to call the clinic with any problems, questions or concerns. No barriers to learning was detected. The total time spent in the appointment was 30 minutes.     Truitt Merle, MD 10/04/2020   I, Joslyn Devon, am acting as scribe for Truitt Merle, MD.   I have reviewed the above documentation for accuracy and completeness, and I agree with the above.

## 2020-10-04 ENCOUNTER — Other Ambulatory Visit: Payer: Self-pay

## 2020-10-04 ENCOUNTER — Inpatient Hospital Stay (HOSPITAL_BASED_OUTPATIENT_CLINIC_OR_DEPARTMENT_OTHER): Payer: Medicare PPO | Admitting: Hematology

## 2020-10-04 ENCOUNTER — Inpatient Hospital Stay: Payer: Medicare PPO | Attending: Hematology

## 2020-10-04 ENCOUNTER — Telehealth: Payer: Self-pay | Admitting: Hematology

## 2020-10-04 VITALS — BP 165/71 | HR 77 | Temp 98.2°F | Resp 16 | Ht 61.0 in | Wt 148.5 lb

## 2020-10-04 DIAGNOSIS — G20 Parkinson's disease: Secondary | ICD-10-CM | POA: Diagnosis not present

## 2020-10-04 DIAGNOSIS — I10 Essential (primary) hypertension: Secondary | ICD-10-CM

## 2020-10-04 DIAGNOSIS — M8580 Other specified disorders of bone density and structure, unspecified site: Secondary | ICD-10-CM | POA: Diagnosis not present

## 2020-10-04 DIAGNOSIS — C50412 Malignant neoplasm of upper-outer quadrant of left female breast: Secondary | ICD-10-CM | POA: Diagnosis not present

## 2020-10-04 DIAGNOSIS — Z923 Personal history of irradiation: Secondary | ICD-10-CM | POA: Insufficient documentation

## 2020-10-04 DIAGNOSIS — Z170 Estrogen receptor positive status [ER+]: Secondary | ICD-10-CM | POA: Diagnosis not present

## 2020-10-04 DIAGNOSIS — Z1231 Encounter for screening mammogram for malignant neoplasm of breast: Secondary | ICD-10-CM | POA: Diagnosis not present

## 2020-10-04 DIAGNOSIS — M8589 Other specified disorders of bone density and structure, multiple sites: Secondary | ICD-10-CM | POA: Diagnosis not present

## 2020-10-04 LAB — CBC WITH DIFFERENTIAL/PLATELET
Abs Immature Granulocytes: 0.01 10*3/uL (ref 0.00–0.07)
Basophils Absolute: 0 10*3/uL (ref 0.0–0.1)
Basophils Relative: 1 %
Eosinophils Absolute: 0.1 10*3/uL (ref 0.0–0.5)
Eosinophils Relative: 1 %
HCT: 43.4 % (ref 36.0–46.0)
Hemoglobin: 13.9 g/dL (ref 12.0–15.0)
Immature Granulocytes: 0 %
Lymphocytes Relative: 20 %
Lymphs Abs: 1.2 10*3/uL (ref 0.7–4.0)
MCH: 30.3 pg (ref 26.0–34.0)
MCHC: 32 g/dL (ref 30.0–36.0)
MCV: 94.6 fL (ref 80.0–100.0)
Monocytes Absolute: 0.7 10*3/uL (ref 0.1–1.0)
Monocytes Relative: 11 %
Neutro Abs: 4 10*3/uL (ref 1.7–7.7)
Neutrophils Relative %: 67 %
Platelets: 225 10*3/uL (ref 150–400)
RBC: 4.59 MIL/uL (ref 3.87–5.11)
RDW: 12.9 % (ref 11.5–15.5)
WBC: 6 10*3/uL (ref 4.0–10.5)
nRBC: 0 % (ref 0.0–0.2)

## 2020-10-04 LAB — COMPREHENSIVE METABOLIC PANEL
ALT: <6 U/L (ref 0–44)
AST: 19 U/L (ref 15–41)
Albumin: 4.3 g/dL (ref 3.5–5.0)
Alkaline Phosphatase: 70 U/L (ref 38–126)
Anion gap: 9 (ref 5–15)
BUN: 17 mg/dL (ref 8–23)
CO2: 26 mmol/L (ref 22–32)
Calcium: 9.1 mg/dL (ref 8.9–10.3)
Chloride: 107 mmol/L (ref 98–111)
Creatinine, Ser: 0.82 mg/dL (ref 0.44–1.00)
GFR, Estimated: >60 mL/min (ref >60)
Glucose, Bld: 100 mg/dL — ABNORMAL HIGH (ref 70–99)
Potassium: 3.9 mmol/L (ref 3.5–5.1)
Sodium: 142 mmol/L (ref 135–145)
Total Bilirubin: 0.6 mg/dL (ref 0.3–1.2)
Total Protein: 7.5 g/dL (ref 6.5–8.1)

## 2020-10-04 MED ORDER — ANASTROZOLE 1 MG PO TABS: 1.0000 mg | ORAL_TABLET | Freq: Every day | ORAL | 2 refills | Status: DC

## 2020-10-04 NOTE — Telephone Encounter (Signed)
Scheduled appointments per 2/24 los. Spoke to patient who is aware of appointments date and times. Gave patient calendar print out.  

## 2020-10-08 ENCOUNTER — Ambulatory Visit (INDEPENDENT_AMBULATORY_CARE_PROVIDER_SITE_OTHER): Payer: Medicare PPO | Admitting: Family Medicine

## 2020-10-08 ENCOUNTER — Other Ambulatory Visit: Payer: Self-pay

## 2020-10-08 ENCOUNTER — Encounter: Payer: Self-pay | Admitting: Family Medicine

## 2020-10-08 ENCOUNTER — Ambulatory Visit (HOSPITAL_COMMUNITY)
Admission: RE | Admit: 2020-10-08 | Discharge: 2020-10-08 | Disposition: A | Discharge status: Home / Self Care | Payer: Medicare PPO | Source: Ambulatory Visit | Attending: Family Medicine | Admitting: Family Medicine

## 2020-10-08 VITALS — BP 164/70 | HR 68 | Temp 98.4°F | Ht 61.0 in | Wt 147.4 lb

## 2020-10-08 DIAGNOSIS — R112 Nausea with vomiting, unspecified: Secondary | ICD-10-CM | POA: Insufficient documentation

## 2020-10-08 DIAGNOSIS — R109 Unspecified abdominal pain: Secondary | ICD-10-CM | POA: Insufficient documentation

## 2020-10-08 DIAGNOSIS — R1031 Right lower quadrant pain: Secondary | ICD-10-CM

## 2020-10-08 DIAGNOSIS — R1110 Vomiting, unspecified: Secondary | ICD-10-CM | POA: Diagnosis not present

## 2020-10-08 MED ORDER — IOHEXOL 300 MG/ML  SOLN
100.0000 mL | Freq: Once | INTRAMUSCULAR | Status: AC | PRN
  Administered 2020-10-08: 100 mL via INTRAVENOUS

## 2020-10-08 NOTE — Addendum Note (Signed)
Addended by: Gwenlyn Perking on: 10/08/2020 10:13 AM   Modules accepted: Orders

## 2020-10-08 NOTE — Addendum Note (Signed)
Addended by: Gwenlyn Perking on: 10/08/2020 10:36 AM   Modules accepted: Orders

## 2020-10-08 NOTE — Progress Notes (Addendum)
Acute Office Visit  Subjective:    Patient ID: Tracey Juarez, female    DOB: 01/07/1941, 80 y.o.   MRN: [id number]  Chief Complaint  Patient presents with  . Abdominal Pain    RLQ pain, nausea over the past 3-4 days    HPI Tracey Juarez is a 80 y.o. female who presents for evaluation of abdominal pain. Onset was 4 days ago. Symptoms have been gradually worsening. The pain is described as aching and sharp, and is 4/10 in intensity. Pain is located in the RLQ without radiation.  Aggravating factors: activity.  Alleviating factors: acetaminophen. Associated symptoms: anorexia and nausea. The patient denies chills, constipation, diarrhea, dysuria, fever, frequency, headache, hematochezia, hematuria, melena, myalgias, sweats and vomiting.   Past Medical History:  Diagnosis Date  . Atrophic vaginitis   . Breast cancer in female [facility])   . Breast cancer of upper-outer quadrant of left female breast ([place]) 03/20/2015  . Detrusor instability   . Elevated cholesterol   . GERD (gastroesophageal reflux disease)   . Hot flashes   . Humerus head fracture, left, with routine healing, subsequent encounter 2000  . Hypertension   . Melanoma ([place])   . Mixed basal-squamous cell carcinoma    skin  . Parkinson disease ([place])   . Personal history of radiation therapy   . Stroke [facility])    on Plavix, no deficits    Past Surgical History:  Procedure Laterality Date  . BRAIN SURGERY    . BREAST LUMPECTOMY Left 04/2015   radiation  . BREAST LUMPECTOMY WITH RADIOACTIVE SEED AND SENTINEL LYMPH NODE BIOPSY Left 04/25/2015   Procedure: LEFT BREAST PARTIAL MASTECTOMY WITH RADIOACTIVE SEED AND SENTINEL LYMPH NODE MAPPING;  Surgeon: Erroll Luna, MD;  Location: [facility] [place];  Service: General;  Laterality: Left;  . FOOT SURGERY  2004  . KNEE SURGERY  2007  . skin cancer excised    . TONSILLECTOMY      Family History  Problem Relation Age of Onset  . Hypertension Mother   .  Heart attack Mother   . Heart attack Maternal Aunt   . Heart attack Maternal Uncle   . Breast cancer Neg Hx     Social History   Socioeconomic History  . Marital status: Married    Spouse name: Joneen Boers   . Number of children: 0  . Years of education: ged  . Highest education level: GED or equivalent  Occupational History  . Occupation: Retired  Tobacco Use  . Smoking status: Never Smoker  . Smokeless tobacco: Never Used  Vaping Use  . Vaping Use: Never used  Substance and Sexual Activity  . Alcohol use: No    Alcohol/week: 0.0 standard drinks  . Drug use: No  . Sexual activity: Never    Birth control/protection: Post-menopausal    Comment: 1st intercourse 83 yo-2 partners  Other Topics Concern  . Not on file  Social History Narrative  . Not on file   Social Determinants of Health   Financial Resource Strain: Low Risk   . Difficulty of Paying Living Expenses: Not hard at all  Food Insecurity: No Food Insecurity  . Worried About Charity fundraiser in the Last Year: Never true  . Ran Out of Food in the Last Year: Never true  Transportation Needs: No Transportation Needs  . Lack of Transportation (Medical): No  . Lack of Transportation (Non-Medical): No  Physical Activity: Inactive  . Days of Exercise per Week: 0  days  . Minutes of Exercise per Session: 0 min  Stress: No Stress Concern Present  . Feeling of Stress : Not at all  Social Connections: Socially Integrated  . Frequency of Communication with Friends and Family: More than three times a week  . Frequency of Social Gatherings with Friends and Family: Once a week  . Attends Religious Services: More than 4 times per year  . Active Member of Clubs or Organizations: Yes  . Attends Banker Meetings: More than 4 times per year  . Marital Status: Married  Catering manager Violence: Not At Risk  . Fear of Current or Ex-Partner: No  . Emotionally Abused: No  . Physically Abused: No  . Sexually Abused:  No    Outpatient Medications Prior to Visit  Medication Sig Dispense Refill  . acetaminophen (TYLENOL) 325 MG tablet Take 650 mg by mouth every 6 (six) hours as needed.    Marland Kitchen anastrozole (ARIMIDEX) 1 MG tablet Take 1 tablet (1 mg total) by mouth daily. 90 tablet 2  . Carbidopa-Levodopa ER (SINEMET CR) 25-100 MG tablet controlled release Take 1 tablet by mouth 3 (three) times daily.    . cetirizine (ZYRTEC) 10 MG tablet Take 10 mg by mouth daily.    . Cholecalciferol (VITAMIN D3) 1.25 MG (50000 UT) TABS Take by mouth. Taking 4,000 ui daily    . clopidogrel (PLAVIX) 75 MG tablet Take 75 mg by mouth daily.    . diphenhydrAMINE-zinc acetate (BENADRYL) cream Apply 1 application topically 3 (three) times daily as needed for itching. 28.4 g 0  . famotidine (PEPCID) 10 MG tablet Take 10 mg by mouth daily.    . fluticasone (FLONASE) 50 MCG/ACT nasal spray Place 1 spray into both nostrils 2 (two) times daily as needed for allergies or rhinitis. 16 g 6  . Ginger, Zingiber officinalis, (GINGER ROOT) 550 MG CAPS Take by mouth.    Marland Kitchen lisinopril (ZESTRIL) 10 MG tablet Take 1 tablet (10 mg total) by mouth daily. 90 tablet 3  . Magnesium 250 MG TABS Take 1 tablet by mouth daily.    . meclizine (ANTIVERT) 12.5 MG tablet TAKE 1 TO 2 TABLETS BY MOUTH THREE TIMES DAILY AS NEEDED FOR DIZZINESS 60 tablet 0  . Multiple Vitamin (MULTIVITAMIN) capsule Take 1 capsule by mouth daily.    Marland Kitchen oxybutynin (DITROPAN-XL) 10 MG 24 hr tablet Take 1 tablet (10 mg total) by mouth at bedtime. 90 tablet 3  . Psyllium (FIBER THERAPY) 25 % POWD Take by mouth.    . Risedronate Sodium 35 MG TBEC Take 1 tablet (35 mg total) by mouth once a week. 12 tablet 0  . rosuvastatin (CRESTOR) 10 MG tablet Take 1 tablet (10 mg total) by mouth daily. 90 tablet 3  . nystatin cream (MYCOSTATIN) Apply 1 application topically 2 (two) times daily. 30 g 0   No facility-administered medications prior to visit.    Allergies  Allergen Reactions  .  Ondansetron Swelling and Palpitations    Review of Systems As per HPI.     Objective:    Physical Exam Vitals and nursing note reviewed.  Constitutional:      General: She is not in acute distress.    Appearance: She is well-developed. She is not ill-appearing, toxic-appearing or diaphoretic.  HENT:     Head: Normocephalic and atraumatic.  Eyes:     Extraocular Movements: Extraocular movements intact.     Pupils: Pupils are equal, round, and reactive to light.  Cardiovascular:  Rate and Rhythm: Normal rate and regular rhythm.  Pulmonary:     Effort: Pulmonary effort is normal. No respiratory distress.     Breath sounds: Normal breath sounds.  Abdominal:     General: Bowel sounds are normal. There is no distension.     Palpations: Abdomen is soft. There is no shifting dullness, fluid wave or mass.     Tenderness: There is abdominal tenderness in the right lower quadrant and periumbilical area. There is no right CVA tenderness, left CVA tenderness, guarding or rebound. Negative signs include Murphy's sign, McBurney's sign and obturator sign.     Hernia: No hernia is present.  Skin:    General: Skin is warm and dry.  Neurological:     General: No focal deficit present.     Mental Status: She is alert and oriented to person, place, and time.  Psychiatric:        Mood and Affect: Mood normal.        Behavior: Behavior normal.     BP (!) 164/70   Pulse 68   Temp 98.4 F (36.9 C) (Temporal)   Ht 5' 1" (1.549 m)   Wt 147 lb 6 oz (66.8 kg)   BMI 27.85 kg/m  Wt Readings from Last 3 Encounters:  10/08/20 147 lb 6 oz (66.8 kg)  10/04/20 148 lb 8 oz (67.4 kg)  07/19/20 150 lb (68 kg)    There are no preventive care reminders to display for this patient.  There are no preventive care reminders to display for this patient.   Lab Results  Component Value Date   TSH 1.990 02/07/2019   Lab Results  Component Value Date   WBC 6.0 10/04/2020   HGB 13.9 10/04/2020    HCT 43.4 10/04/2020   MCV 94.6 10/04/2020   PLT 225 10/04/2020   Lab Results  Component Value Date   NA 142 10/04/2020   K 3.9 10/04/2020   CHLORIDE 105 05/06/2017   CO2 26 10/04/2020   GLUCOSE 100 (H) 10/04/2020   BUN 17 10/04/2020   CREATININE 0.82 10/04/2020   BILITOT 0.6 10/04/2020   ALKPHOS 70 10/04/2020   AST 19 10/04/2020   ALT <6 10/04/2020   PROT 7.5 10/04/2020   ALBUMIN 4.3 10/04/2020   CALCIUM 9.1 10/04/2020   ANIONGAP 9 10/04/2020   EGFR 63 (L) 05/06/2017   Lab Results  Component Value Date   CHOL 148 07/19/2020   Lab Results  Component Value Date   HDL 59 07/19/2020   Lab Results  Component Value Date   LDLCALC 70 07/19/2020   Lab Results  Component Value Date   TRIG 108 07/19/2020   Lab Results  Component Value Date   CHOLHDL 2.5 07/19/2020   No results found for: HGBA1C     Assessment & Plan:   Tracey Juarez was seen today for abdominal pain.  Diagnoses and all orders for this visit:  Right lower quadrant abdominal pain  RLQ and periumbilical tenderness on exam today. Stat CT ordered to r/o apendicitis, will notify patient of results. Follow up for new or worsening symptoms.  -     CT Abdomen Pelvis W Contrast; Future  The patient indicates understanding of these issues and agrees with the plan.   Gwenlyn Perking, FNP

## 2020-10-08 NOTE — Addendum Note (Signed)
Addended by: Gwenlyn Perking on: 10/08/2020 10:32 AM   Modules accepted: Orders

## 2020-10-08 NOTE — Patient Instructions (Signed)
Abdominal Pain, Adult Pain in the abdomen (abdominal pain) can be caused by many things. Often, abdominal pain is not serious and it gets better with no treatment or by being treated at home. However, sometimes abdominal pain is serious. Your health care provider will ask questions about your medical history and do a physical exam to try to determine the cause of your abdominal pain. Follow these instructions at home: Medicines  Take over-the-counter and prescription medicines only as told by your health care provider.  Do not take a laxative unless told by your health care provider. General instructions  Watch your condition for any changes.  Drink enough fluid to keep your urine pale yellow.  Keep all follow-up visits as told by your health care provider. This is important.   Contact a health care provider if:  Your abdominal pain changes or gets worse.  You are not hungry or you lose weight without trying.  You are constipated or have diarrhea for more than 2-3 days.  You have pain when you urinate or have a bowel movement.  Your abdominal pain wakes you up at night.  Your pain gets worse with meals, after eating, or with certain foods.  You are vomiting and cannot keep anything down.  You have a fever.  You have blood in your urine. Get help right away if:  Your pain does not go away as soon as your health care provider told you to expect.  You cannot stop vomiting.  Your pain is only in areas of the abdomen, such as the right side or the left lower portion of the abdomen. Pain on the right side could be caused by appendicitis.  You have bloody or black stools, or stools that look like tar.  You have severe pain, cramping, or bloating in your abdomen.  You have signs of dehydration, such as: ? Dark urine, very little urine, or no urine. ? Cracked lips. ? Dry mouth. ? Sunken eyes. ? Sleepiness. ? Weakness.  You have trouble breathing or chest  pain. Summary  Often, abdominal pain is not serious and it gets better with no treatment or by being treated at home. However, sometimes abdominal pain is serious.  Watch your condition for any changes.  Take over-the-counter and prescription medicines only as told by your health care provider.  Contact a health care provider if your abdominal pain changes or gets worse.  Get help right away if you have severe pain, cramping, or bloating in your abdomen. This information is not intended to replace advice given to you by your health care provider. Make sure you discuss any questions you have with your health care provider. Document Revised: 09/16/2019 Document Reviewed: 12/06/2018 Elsevier Patient Education  [place].

## 2020-10-11 ENCOUNTER — Ambulatory Visit (INDEPENDENT_AMBULATORY_CARE_PROVIDER_SITE_OTHER): Payer: Medicare PPO

## 2020-10-11 ENCOUNTER — Encounter: Payer: Self-pay | Admitting: Family Medicine

## 2020-10-11 ENCOUNTER — Ambulatory Visit (INDEPENDENT_AMBULATORY_CARE_PROVIDER_SITE_OTHER): Payer: Medicare PPO | Admitting: Family Medicine

## 2020-10-11 ENCOUNTER — Other Ambulatory Visit: Payer: Self-pay

## 2020-10-11 VITALS — BP 165/70 | HR 68 | Temp 98.7°F | Ht 61.0 in | Wt 146.6 lb

## 2020-10-11 DIAGNOSIS — R109 Unspecified abdominal pain: Secondary | ICD-10-CM | POA: Diagnosis not present

## 2020-10-11 DIAGNOSIS — R1031 Right lower quadrant pain: Secondary | ICD-10-CM

## 2020-10-11 LAB — MICROSCOPIC EXAMINATION

## 2020-10-11 LAB — URINALYSIS, COMPLETE
Bilirubin, UA: NEGATIVE
Glucose, UA: NEGATIVE
Leukocytes,UA: NEGATIVE
Nitrite, UA: NEGATIVE
Protein,UA: NEGATIVE
Specific Gravity, UA: 1.025 (ref 1.005–1.030)
Urobilinogen, Ur: 0.2 mg/dL (ref 0.2–1.0)
pH, UA: 5.5 (ref 5.0–7.5)

## 2020-10-11 NOTE — Patient Instructions (Signed)
Take miralax twice a day for 1 week.   Constipation, Adult Constipation is when a person has fewer than three bowel movements in a week, has difficulty having a bowel movement, or has stools (feces) that are dry, hard, or larger than normal. Constipation may be caused by an underlying condition. It may become worse with age if a person takes certain medicines and does not take in enough fluids. Follow these instructions at home: Eating and drinking  Eat foods that have a lot of fiber, such as beans, whole grains, and fresh fruits and vegetables.  Limit foods that are low in fiber and high in fat and processed sugars, such as fried or sweet foods. These include french fries, hamburgers, cookies, candies, and soda.  Drink enough fluid to keep your urine pale yellow.   General instructions  Exercise regularly or as told by your health care provider. Try to do 150 minutes of moderate exercise each week.  Use the bathroom when you have the urge to go. Do not hold it in.  Take over-the-counter and prescription medicines only as told by your health care provider. This includes any fiber supplements.  During bowel movements: ? Practice deep breathing while relaxing the lower abdomen. ? Practice pelvic floor relaxation.  Watch your condition for any changes. Let your health care provider know about them.  Keep all follow-up visits as told by your health care provider. This is important. Contact a health care provider if:  You have pain that gets worse.  You have a fever.  You do not have a bowel movement after 4 days.  You vomit.  You are not hungry or you lose weight.  You are bleeding from the opening between the buttocks (anus).  You have thin, pencil-like stools. Get help right away if:  You have a fever and your symptoms suddenly get worse.  You leak stool or have blood in your stool.  Your abdomen is bloated.  You have severe pain in your abdomen.  You feel dizzy or  you faint. Summary  Constipation is when a person has fewer than three bowel movements in a week, has difficulty having a bowel movement, or has stools (feces) that are dry, hard, or larger than normal.  Eat foods that have a lot of fiber, such as beans, whole grains, and fresh fruits and vegetables.  Drink enough fluid to keep your urine pale yellow.  Take over-the-counter and prescription medicines only as told by your health care provider. This includes any fiber supplements. This information is not intended to replace advice given to you by your health care provider. Make sure you discuss any questions you have with your health care provider. Document Revised: 06/15/2019 Document Reviewed: 06/15/2019 Elsevier Patient Education  [place].

## 2020-10-11 NOTE — Progress Notes (Signed)
Acute Office Visit  Subjective:    Patient ID: Tracey Juarez, female    DOB: [birth year], 80 y.o.   MRN: [id number]  Chief Complaint  Patient presents with  . Flank Pain    Right side and it radiates to her back     HPI Patient is in today for flank pain x 8 days. The pain is on her right side and radiates to her back. The pain is like a soreness and is only with movement. She usually does not have any pain when resting. Pain is a 6/10 at it's worse. She was seen 4 days ago and had an abdominal CT with contrast done that was negative save for some moderate stool retention. She has been taking miralax for the last 2 days. She reports a good BM yesterday. Denies fever, chills, vomiting, or diarrhea. Denies dysuria, urgency, or frequency. She has taken tylenol with some relief. The feels like the pain is a little better than Monday but now the pain is sometimes in her right hip with movement. Denies numbness, tingling, or changes in bowel control.   Past Medical History:  Diagnosis Date  . Atrophic vaginitis   . Breast cancer in female [facility])   . Breast cancer of upper-outer quadrant of left female breast ([place]) 03/20/2015  . Detrusor instability   . Elevated cholesterol   . GERD (gastroesophageal reflux disease)   . Hot flashes   . Humerus head fracture, left, with routine healing, subsequent encounter 2000  . Hypertension   . Melanoma ([place])   . Mixed basal-squamous cell carcinoma    skin  . Parkinson disease ([place])   . Personal history of radiation therapy   . Stroke [facility])    on Plavix, no deficits    Past Surgical History:  Procedure Laterality Date  . BRAIN SURGERY    . BREAST LUMPECTOMY Left 04/2015   radiation  . BREAST LUMPECTOMY WITH RADIOACTIVE SEED AND SENTINEL LYMPH NODE BIOPSY Left 04/25/2015   Procedure: LEFT BREAST PARTIAL MASTECTOMY WITH RADIOACTIVE SEED AND SENTINEL LYMPH NODE MAPPING;  Surgeon: Erroll Luna, MD;  Location: [place];   Service: General;  Laterality: Left;  . FOOT SURGERY  2004  . KNEE SURGERY  2007  . skin cancer excised    . TONSILLECTOMY      Family History  Problem Relation Age of Onset  . Hypertension Mother   . Heart attack Mother   . Heart attack Maternal Aunt   . Heart attack Maternal Uncle   . Breast cancer Neg Hx     Social History   Socioeconomic History  . Marital status: Married    Spouse name: Joneen Boers   . Number of children: 0  . Years of education: ged  . Highest education level: GED or equivalent  Occupational History  . Occupation: Retired  Tobacco Use  . Smoking status: Never Smoker  . Smokeless tobacco: Never Used  Vaping Use  . Vaping Use: Never used  Substance and Sexual Activity  . Alcohol use: No    Alcohol/week: 0.0 standard drinks  . Drug use: No  . Sexual activity: Never    Birth control/protection: Post-menopausal    Comment: 1st intercourse 10 yo-2 partners  Other Topics Concern  . Not on file  Social History Narrative  . Not on file   Social Determinants of Health   Financial Resource Strain: Low Risk   . Difficulty of Paying Living Expenses: Not hard at all  Food  Insecurity: No Food Insecurity  . Worried About Charity fundraiser in the Last Year: Never true  . Ran Out of Food in the Last Year: Never true  Transportation Needs: No Transportation Needs  . Lack of Transportation (Medical): No  . Lack of Transportation (Non-Medical): No  Physical Activity: Inactive  . Days of Exercise per Week: 0 days  . Minutes of Exercise per Session: 0 min  Stress: No Stress Concern Present  . Feeling of Stress : Not at all  Social Connections: Socially Integrated  . Frequency of Communication with Friends and Family: More than three times a week  . Frequency of Social Gatherings with Friends and Family: Once a week  . Attends Religious Services: More than 4 times per year  . Active Member of Clubs or Organizations: Yes  . Attends Archivist  Meetings: More than 4 times per year  . Marital Status: Married  Human resources officer Violence: Not At Risk  . Fear of Current or Ex-Partner: No  . Emotionally Abused: No  . Physically Abused: No  . Sexually Abused: No    Outpatient Medications Prior to Visit  Medication Sig Dispense Refill  . acetaminophen (TYLENOL) 325 MG tablet Take 650 mg by mouth every 6 (six) hours as needed.    Marland Kitchen anastrozole (ARIMIDEX) 1 MG tablet Take 1 tablet (1 mg total) by mouth daily. 90 tablet 2  . Carbidopa-Levodopa ER (SINEMET CR) 25-100 MG tablet controlled release Take 1 tablet by mouth 3 (three) times daily.    . cetirizine (ZYRTEC) 10 MG tablet Take 10 mg by mouth daily.    . Cholecalciferol (VITAMIN D3) 1.25 MG (50000 UT) TABS Take by mouth. Taking 4,000 ui daily    . clopidogrel (PLAVIX) 75 MG tablet Take 75 mg by mouth daily.    . diphenhydrAMINE-zinc acetate (BENADRYL) cream Apply 1 application topically 3 (three) times daily as needed for itching. 28.4 g 0  . famotidine (PEPCID) 10 MG tablet Take 10 mg by mouth daily.    . fluticasone (FLONASE) 50 MCG/ACT nasal spray Place 1 spray into both nostrils 2 (two) times daily as needed for allergies or rhinitis. 16 g 6  . Ginger, Zingiber officinalis, (GINGER ROOT) 550 MG CAPS Take by mouth.    Marland Kitchen lisinopril (ZESTRIL) 10 MG tablet Take 1 tablet (10 mg total) by mouth daily. 90 tablet 3  . Magnesium 250 MG TABS Take 1 tablet by mouth daily.    . meclizine (ANTIVERT) 12.5 MG tablet TAKE 1 TO 2 TABLETS BY MOUTH THREE TIMES DAILY AS NEEDED FOR DIZZINESS 60 tablet 0  . Multiple Vitamin (MULTIVITAMIN) capsule Take 1 capsule by mouth daily.    Marland Kitchen oxybutynin (DITROPAN-XL) 10 MG 24 hr tablet Take 1 tablet (10 mg total) by mouth at bedtime. 90 tablet 3  . Psyllium (FIBER THERAPY) 25 % POWD Take by mouth.    . Risedronate Sodium 35 MG TBEC Take 1 tablet (35 mg total) by mouth once a week. 12 tablet 0  . rosuvastatin (CRESTOR) 10 MG tablet Take 1 tablet (10 mg total) by  mouth daily. 90 tablet 3   No facility-administered medications prior to visit.    Allergies  Allergen Reactions  . Ondansetron Swelling and Palpitations    Review of Systems As per HPI.     Objective:    Physical Exam Vitals and nursing note reviewed.  Constitutional:      General: She is not in acute distress.    Appearance:  She is well-developed. She is not ill-appearing, toxic-appearing or diaphoretic.  HENT:     Head: Normocephalic and atraumatic.  Eyes:     Extraocular Movements: Extraocular movements intact.     Pupils: Pupils are equal, round, and reactive to light.  Cardiovascular:     Rate and Rhythm: Normal rate and regular rhythm.  Pulmonary:     Effort: Pulmonary effort is normal. No respiratory distress.     Breath sounds: Normal breath sounds.  Abdominal:     General: Bowel sounds are normal. There is no distension.     Palpations: Abdomen is soft. There is no shifting dullness, fluid wave or mass.     Tenderness: There is no abdominal tenderness. There is no right CVA tenderness, left CVA tenderness, guarding or rebound. Negative signs include Murphy's sign, McBurney's sign and obturator sign.     Hernia: No hernia is present.  Skin:    General: Skin is warm and dry.  Neurological:     General: No focal deficit present.     Mental Status: She is alert and oriented to person, place, and time.  Psychiatric:        Mood and Affect: Mood normal.        Behavior: Behavior normal.    BP (!) 165/70   Pulse 68   Temp 98.7 F (37.1 C) (Temporal)   Ht '5\' 1"'  (1.549 m)   Wt 146 lb 9.6 oz (66.5 kg)   SpO2 99%   BMI 27.70 kg/m  Wt Readings from Last 3 Encounters:  10/11/20 146 lb 9.6 oz (66.5 kg)  10/08/20 147 lb 6 oz (66.8 kg)  10/04/20 148 lb 8 oz (67.4 kg)    Urine dipstick shows positive for RBC's and positive for ketones.  Micro exam: 0-5 WBC's per HPF, 0-2 RBC's per HPF and few bacteria.   There are no preventive care reminders to display for  this patient.  There are no preventive care reminders to display for this patient.   Lab Results  Component Value Date   TSH 1.990 02/07/2019   Lab Results  Component Value Date   WBC 6.0 10/04/2020   HGB 13.9 10/04/2020   HCT 43.4 10/04/2020   MCV 94.6 10/04/2020   PLT 225 10/04/2020   Lab Results  Component Value Date   NA 142 10/04/2020   K 3.9 10/04/2020   CHLORIDE 105 05/06/2017   CO2 26 10/04/2020   GLUCOSE 100 (H) 10/04/2020   BUN 17 10/04/2020   CREATININE 0.82 10/04/2020   BILITOT 0.6 10/04/2020   ALKPHOS 70 10/04/2020   AST 19 10/04/2020   ALT <6 10/04/2020   PROT 7.5 10/04/2020   ALBUMIN 4.3 10/04/2020   CALCIUM 9.1 10/04/2020   ANIONGAP 9 10/04/2020   EGFR 63 (L) 05/06/2017   Lab Results  Component Value Date   CHOL 148 07/19/2020   Lab Results  Component Value Date   HDL 59 07/19/2020   Lab Results  Component Value Date   LDLCALC 70 07/19/2020   Lab Results  Component Value Date   TRIG 108 07/19/2020   Lab Results  Component Value Date   CHOLHDL 2.5 07/19/2020   No results found for: HGBA1C     Assessment & Plan:   Shaheen was seen today for flank pain.  Diagnoses and all orders for this visit:  Right lower quadrant abdominal pain/Flank pain  Improving. No tenderness on exam today. CT on Monday was normal save for moderate stool. KUB today  with normal gas pattern, no obstruction. Phleboliths noted but no renal stones. UA does not indicate infection today, culture is pending. Continue miralax daily for constipation. Follow up in 1 week to reassess, sooner for new or worsening symptoms.  -     Urinalysis, Complete -     Urine Culture -     DG Abd 1 View; Future   The patient indicates understanding of these issues and agrees with the plan.  Gwenlyn Perking, FNP

## 2020-10-13 LAB — URINE CULTURE: Organism ID, Bacteria: NO GROWTH

## 2020-10-19 ENCOUNTER — Other Ambulatory Visit: Payer: Self-pay

## 2020-10-19 ENCOUNTER — Encounter: Payer: Self-pay | Admitting: Family Medicine

## 2020-10-19 ENCOUNTER — Ambulatory Visit (INDEPENDENT_AMBULATORY_CARE_PROVIDER_SITE_OTHER): Payer: Medicare PPO | Admitting: Family Medicine

## 2020-10-19 VITALS — BP 157/66 | HR 66 | Temp 97.5°F | Ht 61.0 in | Wt 145.0 lb

## 2020-10-19 DIAGNOSIS — R109 Unspecified abdominal pain: Secondary | ICD-10-CM

## 2020-10-19 DIAGNOSIS — I10 Essential (primary) hypertension: Secondary | ICD-10-CM | POA: Diagnosis not present

## 2020-10-19 DIAGNOSIS — K5900 Constipation, unspecified: Secondary | ICD-10-CM | POA: Diagnosis not present

## 2020-10-19 NOTE — Progress Notes (Signed)
Established Patient Office Visit  Subjective:  Patient ID: Tracey Juarez, female    DOB: July 16, 1941  Age: 80 y.o. MRN: [id number]  CC:  Chief Complaint  Patient presents with  . Abdominal Pain    HPI Tracey Juarez presents for follow up of abdominal pain. She reports that her abdominal pain has resolved. She denies fever, nausea, or vomiting. She did take miralax as prescribed. She reports that the pain gradually got better. Her las BM was this morning and was without straining.   Past Medical History:  Diagnosis Date  . Atrophic vaginitis   . Breast cancer in female [facility])   . Breast cancer of upper-outer quadrant of left female breast ([place]) 03/20/2015  . Detrusor instability   . Elevated cholesterol   . GERD (gastroesophageal reflux disease)   . Hot flashes   . Humerus head fracture, left, with routine healing, subsequent encounter 2000  . Hypertension   . Melanoma ([place])   . Mixed basal-squamous cell carcinoma    skin  . Parkinson disease ([place])   . Personal history of radiation therapy   . Stroke [facility])    on Plavix, no deficits    Past Surgical History:  Procedure Laterality Date  . BRAIN SURGERY    . BREAST LUMPECTOMY Left 04/2015   radiation  . BREAST LUMPECTOMY WITH RADIOACTIVE SEED AND SENTINEL LYMPH NODE BIOPSY Left 04/25/2015   Procedure: LEFT BREAST PARTIAL MASTECTOMY WITH RADIOACTIVE SEED AND SENTINEL LYMPH NODE MAPPING;  Surgeon: Erroll Luna, MD;  Location: [place];  Service: General;  Laterality: Left;  . FOOT SURGERY  2004  . KNEE SURGERY  2007  . skin cancer excised    . TONSILLECTOMY      Family History  Problem Relation Age of Onset  . Hypertension Mother   . Heart attack Mother   . Heart attack Maternal Aunt   . Heart attack Maternal Uncle   . Breast cancer Neg Hx     Social History   Socioeconomic History  . Marital status: Married    Spouse name: Joneen Boers   . Number of children: 0  . Years of education:  ged  . Highest education level: GED or equivalent  Occupational History  . Occupation: Retired  Tobacco Use  . Smoking status: Never Smoker  . Smokeless tobacco: Never Used  Vaping Use  . Vaping Use: Never used  Substance and Sexual Activity  . Alcohol use: No    Alcohol/week: 0.0 standard drinks  . Drug use: No  . Sexual activity: Never    Birth control/protection: Post-menopausal    Comment: 1st intercourse 38 yo-2 partners  Other Topics Concern  . Not on file  Social History Narrative  . Not on file   Social Determinants of Health   Financial Resource Strain: Low Risk   . Difficulty of Paying Living Expenses: Not hard at all  Food Insecurity: No Food Insecurity  . Worried About Charity fundraiser in the Last Year: Never true  . Ran Out of Food in the Last Year: Never true  Transportation Needs: No Transportation Needs  . Lack of Transportation (Medical): No  . Lack of Transportation (Non-Medical): No  Physical Activity: Inactive  . Days of Exercise per Week: 0 days  . Minutes of Exercise per Session: 0 min  Stress: No Stress Concern Present  . Feeling of Stress : Not at all  Social Connections: Socially Integrated  . Frequency of Communication with Friends and  Family: More than three times a week  . Frequency of Social Gatherings with Friends and Family: Once a week  . Attends Religious Services: More than 4 times per year  . Active Member of Clubs or Organizations: Yes  . Attends Archivist Meetings: More than 4 times per year  . Marital Status: Married  Human resources officer Violence: Not At Risk  . Fear of Current or Ex-Partner: No  . Emotionally Abused: No  . Physically Abused: No  . Sexually Abused: No    Outpatient Medications Prior to Visit  Medication Sig Dispense Refill  . acetaminophen (TYLENOL) 325 MG tablet Take 650 mg by mouth every 6 (six) hours as needed.    Marland Kitchen anastrozole (ARIMIDEX) 1 MG tablet Take 1 tablet (1 mg total) by mouth daily. 90  tablet 2  . Carbidopa-Levodopa ER (SINEMET CR) 25-100 MG tablet controlled release Take 1 tablet by mouth 3 (three) times daily.    . cetirizine (ZYRTEC) 10 MG tablet Take 10 mg by mouth daily.    . Cholecalciferol (VITAMIN D3) 1.25 MG (50000 UT) TABS Take by mouth. Taking 4,000 ui daily    . clopidogrel (PLAVIX) 75 MG tablet Take 75 mg by mouth daily.    . diphenhydrAMINE-zinc acetate (BENADRYL) cream Apply 1 application topically 3 (three) times daily as needed for itching. 28.4 g 0  . famotidine (PEPCID) 10 MG tablet Take 10 mg by mouth daily.    . fluticasone (FLONASE) 50 MCG/ACT nasal spray Place 1 spray into both nostrils 2 (two) times daily as needed for allergies or rhinitis. 16 g 6  . Ginger, Zingiber officinalis, (GINGER ROOT) 550 MG CAPS Take by mouth.    Marland Kitchen lisinopril (ZESTRIL) 10 MG tablet Take 1 tablet (10 mg total) by mouth daily. 90 tablet 3  . Magnesium 250 MG TABS Take 1 tablet by mouth daily.    . meclizine (ANTIVERT) 12.5 MG tablet TAKE 1 TO 2 TABLETS BY MOUTH THREE TIMES DAILY AS NEEDED FOR DIZZINESS 60 tablet 0  . Multiple Vitamin (MULTIVITAMIN) capsule Take 1 capsule by mouth daily.    Marland Kitchen oxybutynin (DITROPAN-XL) 10 MG 24 hr tablet Take 1 tablet (10 mg total) by mouth at bedtime. 90 tablet 3  . Psyllium (FIBER THERAPY) 25 % POWD Take by mouth.    . Risedronate Sodium 35 MG TBEC Take 1 tablet (35 mg total) by mouth once a week. 12 tablet 0  . rosuvastatin (CRESTOR) 10 MG tablet Take 1 tablet (10 mg total) by mouth daily. 90 tablet 3   No facility-administered medications prior to visit.    Allergies  Allergen Reactions  . Ondansetron Swelling and Palpitations    ROS Review of Systems  As per HPI.   Objective:    Physical Exam Vitals and nursing note reviewed.  Constitutional:      General: She is not in acute distress.    Appearance: She is not ill-appearing, toxic-appearing or diaphoretic.  Cardiovascular:     Rate and Rhythm: Normal rate and regular rhythm.      Heart sounds: Normal heart sounds.  Pulmonary:     Effort: Pulmonary effort is normal. No respiratory distress.     Breath sounds: Normal breath sounds.  Abdominal:     General: Bowel sounds are normal. There is no distension.     Palpations: Abdomen is soft. There is no shifting dullness or fluid wave.     Tenderness: There is no abdominal tenderness. There is no right CVA tenderness,  left CVA tenderness, guarding or rebound. Negative signs include Murphy's sign, Rovsing's sign and McBurney's sign.     Hernia: No hernia is present.  Skin:    General: Skin is warm and dry.  Neurological:     Mental Status: She is alert and oriented to person, place, and time.  Psychiatric:        Mood and Affect: Mood normal.        Behavior: Behavior normal.     BP (!) 157/66   Pulse 66   Temp (!) 97.5 F (36.4 C) (Temporal)   Ht _0  (1.549 m)   Wt 145 lb (65.8 kg)   BMI 27.40 kg/m  Wt Readings from Last 3 Encounters:  10/19/20 145 lb (65.8 kg)  10/11/20 146 lb 9.6 oz (66.5 kg)  10/08/20 147 lb 6 oz (66.8 kg)     There are no preventive care reminders to display for this patient.  There are no preventive care reminders to display for this patient.  Lab Results  Component Value Date   TSH 1.990 02/07/2019   Lab Results  Component Value Date   WBC 6.0 10/04/2020   HGB 13.9 10/04/2020   HCT 43.4 10/04/2020   MCV 94.6 10/04/2020   PLT 225 10/04/2020   Lab Results  Component Value Date   NA 142 10/04/2020   K 3.9 10/04/2020   CHLORIDE 105 05/06/2017   CO2 26 10/04/2020   GLUCOSE 100 (H) 10/04/2020   BUN 17 10/04/2020   CREATININE 0.82 10/04/2020   BILITOT 0.6 10/04/2020   ALKPHOS 70 10/04/2020   AST 19 10/04/2020   ALT <6 10/04/2020   PROT 7.5 10/04/2020   ALBUMIN 4.3 10/04/2020   CALCIUM 9.1 10/04/2020   ANIONGAP 9 10/04/2020   EGFR 63 (L) 05/06/2017   Lab Results  Component Value Date   CHOL 148 07/19/2020   Lab Results  Component Value Date   HDL 59  07/19/2020   Lab Results  Component Value Date   LDLCALC 70 07/19/2020   Lab Results  Component Value Date   TRIG 108 07/19/2020   Lab Results  Component Value Date   CHOLHDL 2.5 07/19/2020   No results found for: HGBA1C    Assessment & Plan:   Tracey Juarez was seen today for abdominal pain.  Diagnoses and all orders for this visit:  Acute abdominal pain Resolved. Begin exam today.   Constipation, unspecified constipation type Miralax as needed. Stay well hydrated. Handout given.   Primary hypertension Elevated today in office. BP was 126/61 at home this morning. Patient reports BP is always elevated in the office. Continue current medications. Patient has a chronic follow up with PCP in 4 weeks.   Follow-up: Return if symptoms worsen or fail to improve. Keep scheduled appt with PCP.    The patient indicates understanding of these issues and agrees with the plan.  Gwenlyn Perking, FNP

## 2020-10-19 NOTE — Patient Instructions (Signed)

## 2020-10-31 DIAGNOSIS — Z20822 Contact with and (suspected) exposure to covid-19: Secondary | ICD-10-CM | POA: Diagnosis not present

## 2020-11-05 DIAGNOSIS — L570 Actinic keratosis: Secondary | ICD-10-CM | POA: Diagnosis not present

## 2020-11-19 ENCOUNTER — Ambulatory Visit (INDEPENDENT_AMBULATORY_CARE_PROVIDER_SITE_OTHER): Payer: Medicare PPO | Admitting: Family Medicine

## 2020-11-19 ENCOUNTER — Other Ambulatory Visit: Payer: Self-pay

## 2020-11-19 ENCOUNTER — Encounter: Payer: Self-pay | Admitting: Family Medicine

## 2020-11-19 VITALS — BP 142/58 | HR 67 | Ht 61.0 in | Wt 146.0 lb

## 2020-11-19 DIAGNOSIS — E7800 Pure hypercholesterolemia, unspecified: Secondary | ICD-10-CM

## 2020-11-19 DIAGNOSIS — Z23 Encounter for immunization: Secondary | ICD-10-CM | POA: Diagnosis not present

## 2020-11-19 DIAGNOSIS — M8589 Other specified disorders of bone density and structure, multiple sites: Secondary | ICD-10-CM | POA: Diagnosis not present

## 2020-11-19 DIAGNOSIS — G20 Parkinson's disease: Secondary | ICD-10-CM | POA: Diagnosis not present

## 2020-11-19 DIAGNOSIS — I10 Essential (primary) hypertension: Secondary | ICD-10-CM

## 2020-11-19 LAB — LIPID PANEL
Chol/HDL Ratio: 2.9 ratio (ref 0.0–4.4)
Cholesterol, Total: 166 mg/dL (ref 100–199)
HDL: 57 mg/dL (ref >39)
LDL Chol Calc (NIH): 92 mg/dL (ref 0–99)
Triglycerides: 93 mg/dL (ref 0–149)
VLDL Cholesterol Cal: 17 mg/dL (ref 5–40)

## 2020-11-19 LAB — CMP14+EGFR
ALT: 7 IU/L (ref 0–32)
AST: 16 IU/L (ref 0–40)
Albumin/Globulin Ratio: 1.9 (ref 1.2–2.2)
Albumin: 4.3 g/dL (ref 3.7–4.7)
Alkaline Phosphatase: 76 IU/L (ref 44–121)
BUN/Creatinine Ratio: 23 (ref 12–28)
BUN: 19 mg/dL (ref 8–27)
Bilirubin Total: 0.5 mg/dL (ref 0.0–1.2)
CO2: 24 mmol/L (ref 20–29)
Calcium: 9.2 mg/dL (ref 8.7–10.3)
Chloride: 103 mmol/L (ref 96–106)
Creatinine, Ser: 0.81 mg/dL (ref 0.57–1.00)
Globulin, Total: 2.3 g/dL (ref 1.5–4.5)
Glucose: 96 mg/dL (ref 65–99)
Potassium: 4.5 mmol/L (ref 3.5–5.2)
Sodium: 142 mmol/L (ref 134–144)
Total Protein: 6.6 g/dL (ref 6.0–8.5)
eGFR: 74 mL/min/{1.73_m2} (ref >59)

## 2020-11-19 NOTE — Addendum Note (Signed)
Addended by: Alphonzo Dublin on: 11/19/2020 09:50 AM   Modules accepted: Orders

## 2020-11-19 NOTE — Progress Notes (Signed)
BP (!) 142/58   Pulse 67   Ht _0  (1.549 m)   Wt 146 lb (66.2 kg)   SpO2 97%   BMI 27.59 kg/m    Subjective:   Patient ID: Tracey Juarez, female    DOB: [birth year], 80 y.o.   MRN: [id number]  HPI: Tracey Juarez is a 80 y.o. female presenting on 11/19/2020 for Medical Management of Chronic Issues and Hyperlipidemia   HPI Hypertension Patient is currently on lisinopril, and their blood pressure today is 142/58. Patient denies any lightheadedness or dizziness. Patient denies headaches, blurred vision, chest pains, shortness of breath, or weakness. Denies any side effects from medication and is content with current medication.   Hyperlipidemia Patient is coming in for recheck of his hyperlipidemia. The patient is currently taking Crestor. They deny any issues with myalgias or history of liver damage from it. They deny any focal numbness or weakness or chest pain.   Parkinson's disease Patient has Parkinson's disease and managed by neurology.  Osteoporosis/osteopenia Fractures or history of fracture: None Medication: Risedronate Duration of treatment: 4 years Last bone density scan: 2018 Last T score: -2.0  Relevant past medical, surgical, family and social history reviewed and updated as indicated. Interim medical history since our last visit reviewed. Allergies and medications reviewed and updated.  Review of Systems  Constitutional: Negative for chills and fever.  HENT: Negative for congestion, ear discharge and ear pain.   Eyes: Negative for redness and visual disturbance.  Respiratory: Negative for chest tightness and shortness of breath.   Cardiovascular: Negative for chest pain and leg swelling.  Genitourinary: Negative for difficulty urinating and dysuria.  Musculoskeletal: Negative for back pain and gait problem.  Skin: Negative for rash.  Neurological: Negative for light-headedness and headaches.  Psychiatric/Behavioral: Negative for agitation and  behavioral problems.  All other systems reviewed and are negative.   Per HPI unless specifically indicated above   Allergies as of 11/19/2020      Reactions   Ondansetron Swelling, Palpitations      Medication List       Accurate as of November 19, 2020  9:21 AM. If you have any questions, ask your nurse or doctor.        acetaminophen 325 MG tablet Commonly known as: TYLENOL Take 650 mg by mouth every 6 (six) hours as needed.   anastrozole 1 MG tablet Commonly known as: ARIMIDEX Take 1 tablet (1 mg total) by mouth daily.   Carbidopa-Levodopa ER 25-100 MG tablet controlled release Commonly known as: SINEMET CR Take 1 tablet by mouth 3 (three) times daily.   cetirizine 10 MG tablet Commonly known as: ZYRTEC Take 10 mg by mouth daily.   clopidogrel 75 MG tablet Commonly known as: PLAVIX Take 75 mg by mouth daily.   diphenhydrAMINE-zinc acetate cream Commonly known as: BENADRYL Apply 1 application topically 3 (three) times daily as needed for itching.   famotidine 10 MG tablet Commonly known as: PEPCID Take 10 mg by mouth daily.   Fiber Therapy 25 % Powd Generic drug: Psyllium Take by mouth.   fluticasone 50 MCG/ACT nasal spray Commonly known as: FLONASE Place 1 spray into both nostrils 2 (two) times daily as needed for allergies or rhinitis.   Ginger Root 550 MG Caps Take by mouth.   lisinopril 10 MG tablet Commonly known as: ZESTRIL Take 1 tablet (10 mg total) by mouth daily.   Magnesium 250 MG Tabs Take 1 tablet by mouth daily.   meclizine  12.5 MG tablet Commonly known as: ANTIVERT TAKE 1 TO 2 TABLETS BY MOUTH THREE TIMES DAILY AS NEEDED FOR DIZZINESS   multivitamin capsule Take 1 capsule by mouth daily.   oxybutynin 10 MG 24 hr tablet Commonly known as: DITROPAN-XL Take 1 tablet (10 mg total) by mouth at bedtime.   Risedronate Sodium 35 MG Tbec Take 1 tablet (35 mg total) by mouth once a week.   rosuvastatin 10 MG tablet Commonly known as:  CRESTOR Take 1 tablet (10 mg total) by mouth daily.   Vitamin D3 1.25 MG (50000 UT) Tabs Take by mouth. Taking 4,000 ui daily        Objective:   BP (!) 142/58   Pulse 67   Ht _0  (1.549 m)   Wt 146 lb (66.2 kg)   SpO2 97%   BMI 27.59 kg/m   Wt Readings from Last 3 Encounters:  11/19/20 146 lb (66.2 kg)  10/19/20 145 lb (65.8 kg)  10/11/20 146 lb 9.6 oz (66.5 kg)    Physical Exam Vitals and nursing note reviewed.  Constitutional:      General: She is not in acute distress.    Appearance: She is well-developed. She is not diaphoretic.  Eyes:     Conjunctiva/sclera: Conjunctivae normal.  Cardiovascular:     Rate and Rhythm: Normal rate and regular rhythm.     Heart sounds: Normal heart sounds. No murmur heard.   Pulmonary:     Effort: Pulmonary effort is normal. No respiratory distress.     Breath sounds: Normal breath sounds. No wheezing.  Musculoskeletal:        General: No tenderness. Normal range of motion.  Skin:    General: Skin is warm and dry.     Findings: No rash.  Neurological:     Mental Status: She is alert and oriented to person, place, and time.     Coordination: Coordination normal.  Psychiatric:        Behavior: Behavior normal.       Assessment & Plan:   Problem List Items Addressed This Visit      Cardiovascular and Mediastinum   Hypertension     Nervous and Auditory   Parkinson disease ([place])     Musculoskeletal and Integument   Osteopenia   Relevant Orders   DG [facility] DEXA     Other   Pure hypercholesterolemia - Primary   Relevant Orders   CMP14+EGFR   Lipid panel      Continue current medication, she is due for bone density scan and have ordered that.  She will get on the schedule as soon as she can do that.  Blood pressure looks decent today.  No change in medication Follow up plan: Return in about 4 months (around 03/21/2021), or if symptoms worsen or fail to improve, for Hyperlipidemia and hypertension.  Counseling  provided for all of the vaccine components Orders Placed This Encounter  Procedures  . DG [facility] DEXA  . CMP14+EGFR  . Lipid panel    Caryl Pina, MD [facility] 11/19/2020, 9:21 AM

## 2020-11-26 DIAGNOSIS — G20 Parkinson's disease: Secondary | ICD-10-CM | POA: Diagnosis not present

## 2020-11-26 DIAGNOSIS — I6782 Cerebral ischemia: Secondary | ICD-10-CM | POA: Diagnosis not present

## 2020-11-30 ENCOUNTER — Ambulatory Visit (INDEPENDENT_AMBULATORY_CARE_PROVIDER_SITE_OTHER): Payer: Medicare PPO

## 2020-11-30 DIAGNOSIS — Z0000 Encounter for general adult medical examination without abnormal findings: Secondary | ICD-10-CM | POA: Diagnosis not present

## 2020-11-30 NOTE — Progress Notes (Signed)
MEDICARE ANNUAL WELLNESS VISIT  11/30/2020  Telephone Visit Disclaimer This Medicare AWV was conducted by telephone due to national recommendations for restrictions regarding the COVID-19 Pandemic (e.g. social distancing).  I verified, using two identifiers, that I am speaking with Tracey Juarez or their authorized healthcare agent. I discussed the limitations, risks, security, and privacy concerns of performing an evaluation and management service by telephone and the potential availability of an in-person appointment in the future. The patient expressed understanding and agreed to proceed.  Location of Patient: Home Location of Provider (nurse):  [facility]  Subjective:    Tracey Juarez is a 80 y.o. female patient of Dettinger, Fransisca Kaufmann, MD who had a Medicare Annual Wellness Visit today via telephone. Tracey Juarez is Retired and lives with their spouse. she has no children. she reports that she is socially active and does interact with friends/family regularly. she is minimally physically active and enjoys dancing and shopping.  Patient Care Team: Dettinger, Fransisca Kaufmann, MD as PCP - General (Family Medicine) Fontaine, Belinda Block, MD (Inactive) as Consulting Physician (Gynecology) Erroll Luna, MD as Consulting Physician (General Surgery) Truitt Merle, MD as Consulting Physician (Hematology) Thea Silversmith, MD as Consulting Physician (Radiation Oncology) Mauro Kaufmann, RN as Registered Nurse Rockwell Germany, RN as Registered Nurse Jake Shark, Johny Blamer, NP as Nurse Practitioner (Nurse Practitioner) Ellwood Dense., MD as Referring Physician (Neurology)  Advanced Directives 11/30/2020 11/24/2019 05/06/2017 09/18/2016 04/30/2016 12/26/2015 10/01/2015  Does Patient Have a Medical Advance Directive? No;Yes Yes Yes Yes Yes Yes Yes  Type of Paramedic of [place];Living will Living will;Healthcare Power of [place];Living will [place];Living will [place];Living will  Does patient want to make changes to medical advance directive? No - Patient declined No - Patient declined - - - - -  Copy of [place] in Chart? - No - copy requested - - No - copy requested No - copy requested No - copy requested  Would patient like information on creating a medical advance directive? No - Patient declined - - - - - [facility] Utilization Over the Past 12 Months: # of hospitalizations or ER visits: 0 # of surgeries: 0  Review of Systems    Patient reports that her overall health is unchanged compared to last year.  Patient Reported Readings (BP, Pulse, CBG, Weight, etc) none  Pain Assessment Pain : 0-10 Pain Score: 5  Pain Location: Back Pain Orientation: Lower Pain Descriptors / Indicators: Discomfort Pain Onset: More than a month ago Pain Frequency: Intermittent Pain Relieving Factors: Tylenol some helps  Pain Relieving Factors: Tylenol some helps  Current Medications & Allergies (verified) Allergies as of 11/30/2020      Reactions   Ondansetron Swelling, Palpitations      Medication List       Accurate as of November 30, 2020 10:49 AM. If you have any questions, ask your nurse or doctor.        acetaminophen 325 MG tablet Commonly known as: TYLENOL Take 650 mg by mouth every 6 (six) hours as needed.   anastrozole 1 MG tablet Commonly known as: ARIMIDEX Take 1 tablet (1 mg total) by mouth daily.   Carbidopa-Levodopa ER 25-100 MG tablet controlled release Commonly known as: SINEMET CR Take 1 tablet by mouth 3 (three) times daily.   cetirizine 10 MG tablet Commonly known as: ZYRTEC Take  10 mg by mouth daily.   clopidogrel 75 MG tablet Commonly known as: PLAVIX Take 75 mg by mouth daily.   diphenhydrAMINE-zinc acetate cream Commonly known as: BENADRYL Apply 1 application topically 3 (three) times daily as needed  for itching.   famotidine 10 MG tablet Commonly known as: PEPCID Take 10 mg by mouth daily.   Fiber Therapy 25 % Powd Generic drug: Psyllium Take by mouth.   fluticasone 50 MCG/ACT nasal spray Commonly known as: FLONASE Place 1 spray into both nostrils 2 (two) times daily as needed for allergies or rhinitis.   Ginger Root 550 MG Caps Take by mouth.   lisinopril 10 MG tablet Commonly known as: ZESTRIL Take 1 tablet (10 mg total) by mouth daily.   Magnesium 250 MG Tabs Take 1 tablet by mouth daily.   meclizine 12.5 MG tablet Commonly known as: ANTIVERT TAKE 1 TO 2 TABLETS BY MOUTH THREE TIMES DAILY AS NEEDED FOR DIZZINESS   multivitamin capsule Take 1 capsule by mouth daily.   oxybutynin 10 MG 24 hr tablet Commonly known as: DITROPAN-XL Take 1 tablet (10 mg total) by mouth at bedtime.   Risedronate Sodium 35 MG Tbec Take 1 tablet (35 mg total) by mouth once a week.   rosuvastatin 10 MG tablet Commonly known as: CRESTOR Take 1 tablet (10 mg total) by mouth daily.   Vitamin D3 1.25 MG (50000 UT) Tabs Take by mouth. Taking 4,000 ui daily       History (reviewed): Past Medical History:  Diagnosis Date  . Atrophic vaginitis   . Breast cancer in female [facility])   . 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 Detrusor instability   . Elevated cholesterol   . GERD (gastroesophageal reflux disease)   . Hot flashes   . Humerus head fracture, left, with routine healing, subsequent encounter 2000  . Hypertension   . Melanoma ([place])   . Mixed basal-squamous cell carcinoma    skin  . Parkinson disease ([place])   . Personal history of radiation therapy   . Stroke [facility] Hiltunen)    on Plavix, no deficits   Past Surgical History:  Procedure Laterality Date  . BRAIN SURGERY    . BREAST LUMPECTOMY Left 04/2015   radiation  . BREAST LUMPECTOMY WITH RADIOACTIVE SEED AND SENTINEL LYMPH NODE BIOPSY Left 04/25/2015   Procedure: LEFT BREAST PARTIAL  MASTECTOMY WITH RADIOACTIVE SEED AND SENTINEL LYMPH NODE MAPPING;  Surgeon: Erroll Luna, MD;  Location: [place];  Service: General;  Laterality: Left;  . FOOT SURGERY  2004  . KNEE SURGERY  2007  . skin cancer excised    . TONSILLECTOMY     Family History  Problem Relation Age of Onset  . Hypertension Mother   . Heart attack Mother   . Heart attack Maternal Aunt   . Heart attack Maternal Uncle   . Breast cancer Neg Hx    Social History   Socioeconomic History  . Marital status: Married    Spouse name: Joneen Boers   . Number of children: 0  . Years of education: ged  . Highest education level: GED or equivalent  Occupational History  . Occupation: Retired  Tobacco Use  . Smoking status: Never Smoker  . Smokeless tobacco: Never Used  Vaping Use  . Vaping Use: Never used  Substance and Sexual Activity  . Alcohol use: No    Alcohol/week: 0.0 standard drinks  . Drug use: No  . Sexual  activity: Never    Birth control/protection: Post-menopausal    Comment: 1st intercourse 6 yo-2 partners  Other Topics Concern  . Not on file  Social History Narrative  . Not on file   Social Determinants of Health   Financial Resource Strain: Not on file  Food Insecurity: Not on file  Transportation Needs: Not on file  Physical Activity: Not on file  Stress: Not on file  Social Connections: Not on file    Activities of Daily Living In your present state of health, do you have any difficulty performing the following activities: 11/30/2020  Hearing? N  Vision? N  Difficulty concentrating or making decisions? N  Walking or climbing stairs? N  Dressing or bathing? N  Doing errands, shopping? N  Preparing Food and eating ? N  Using the Toilet? N  In the past six months, have you accidently leaked urine? N  Do you have problems with loss of bowel control? N  Managing your Medications? N  Managing your Finances? N  Housekeeping or managing your Housekeeping? N  Some  recent data might be hidden    Patient Education/ Literacy What is the last grade level you completed in school?: GED  Exercise Current Exercise Habits: Home exercise routine, Time (Minutes): 60, Frequency (Times/Week): 2, Weekly Exercise (Minutes/Week): 120, Intensity: Moderate  Diet Patient reports consuming 3 meals a day and 2 snack(s) a day Patient reports that her primary diet is: Regular Patient reports that she does have regular access to food.   Depression Screen PHQ 2/9 Scores 11/30/2020 11/19/2020 10/11/2020 10/08/2020 07/19/2020 05/02/2020 03/19/2020  PHQ - 2 Score 0 0 0 0 0 0 0     Fall Risk Fall Risk  11/30/2020 11/19/2020 10/11/2020 10/08/2020 07/19/2020  Falls in the past year? 0 0 0 0 0  Number falls in past yr: - - - - -  Injury with Fall? - - - - -  Risk for fall due to : - - - - -  Follow up - - - - -     Objective:  Cache seemed alert and oriented and she participated appropriately during our telephone visit.  Blood Pressure Weight BMI  BP Readings from Last 3 Encounters:  11/19/20 (!) 142/58  10/19/20 (!) 157/66  10/11/20 (!) 165/70   Wt Readings from Last 3 Encounters:  11/19/20 146 lb (66.2 kg)  10/19/20 145 lb (65.8 kg)  10/11/20 146 lb 9.6 oz (66.5 kg)   BMI Readings from Last 1 Encounters:  11/19/20 27.59 kg/m    *Unable to obtain current vital signs, weight, and BMI due to telephone visit type  Hearing/Vision  . Jimmye did not seem to have difficulty with hearing/understanding during the telephone conversation . Reports that she has had a formal eye exam by an eye care professional within the past year . Reports that she has not had a formal hearing evaluation within the past year *Unable to fully assess hearing and vision during telephone visit type  Cognitive Function: 6CIT Screen 11/30/2020 11/24/2019  What Year? 0 points 0 points  What month? 0 points 0 points  What time? 0 points 0 points  Count back from 20 0 points 0 points   Months in reverse 0 points 0 points  Repeat phrase 0 points 0 points  Total Score 0 0   (Normal:0-7, Significant for Dysfunction: >8)  Normal Cognitive Function Screening: Yes   Immunization & Health Maintenance Record Immunization History  Administered Date(s) Administered  .  Influenza, High Dose Seasonal PF 05/09/2013, 05/08/2014, 05/20/2017, 05/26/2018, 05/03/2019  . Influenza,inj,Quad PF,6+ Mos 05/15/2015  . Influenza-Unspecified 05/15/2015, 05/07/2016, 05/20/2017, 05/03/2019  . Moderna Sars-Covid-2 Vaccination 08/31/2019, 09/28/2019, 06/04/2020  . Pneumococcal Conjugate-13 08/15/2013  . Pneumococcal Polysaccharide-23 12/18/2017  . Tdap 06/02/2011  . Zoster Recombinat (Shingrix) 11/19/2020    Health Maintenance  Topic Date Due  . COVID-19 Vaccine (4 - Booster for Moderna series) 12/03/2020  . INFLUENZA VACCINE  03/11/2021  . TETANUS/TDAP  06/01/2021  . DEXA SCAN  Completed  . PNA vac Low Risk Adult  Completed  . HPV VACCINES  Aged Out  . PAP SMEAR-Modifier  Discontinued  . Hepatitis C Screening  Discontinued       Assessment  This is a routine wellness examination for [employer].  Health Maintenance: Due or Overdue Health Maintenance Due  Topic Date Due  . COVID-19 Vaccine (4 - Booster for Moderna series) 12/03/2020    Tracey Juarez does not need a referral for Community Assistance: Care Management:   no Social Work:    no Prescription Assistance:  no Nutrition/Diabetes Education:  no   Plan:  Personalized Goals Goals Addressed            This Visit's Progress   . Exercise 3x per week (30 min per time)   On track    11/24/2019 AWV Goal: Exercise for General Health   Patient will verbalize understanding of the benefits of increased physical activity:  Exercising regularly is important. It will improve your overall fitness, flexibility, and endurance.  Regular exercise also will improve your overall health. It can help you control  your weight, reduce stress, and improve your bone density.  Over the next year, patient will increase physical activity as tolerated with a goal of at least 150 minutes of moderate physical activity per week.   You can tell that you are exercising at a moderate intensity if your heart starts beating faster and you start breathing faster but can still hold a conversation.  Moderate-intensity exercise ideas include:  Walking 1 mile (1.6 km) in about 15 minutes  Biking  Hiking  Golfing  Dancing  Water aerobics  Patient will verbalize understanding of everyday activities that increase physical activity by providing examples like the following: ? Yard work, such as: ? Pushing a Conservation officer, nature ? Raking and bagging leaves ? Washing your car ? Pushing a stroller ? Shoveling snow ? Gardening ? Washing windows or floors  Patient will be able to explain general safety guidelines for exercising:   Before you start a new exercise program, talk with your health care provider.  Do not exercise so much that you hurt yourself, feel dizzy, or get very short of breath.  Wear comfortable clothes and wear shoes with good support.  Drink plenty of water while you exercise to prevent dehydration or heat stroke.  Work out until your breathing and your heartbeat get faster.     . Prevent falls   On track     Personalized Health Maintenance & Screening Recommendations   Lung Cancer Screening Recommended: no (Low Dose CT Chest recommended if Age 64-80 years, 30 pack-year currently smoking OR have quit w/in past 15 years) Hepatitis C Screening recommended: no HIV Screening recommended: no  Advanced Directives: Written information was not prepared per patient's request.  Referrals & Orders No orders of the defined types were placed in this encounter.   Follow-up Plan . Follow-up with Dettinger, Fransisca Kaufmann, MD as planned .  Schedule 03/21/21   I have personally reviewed and noted the  following in the patient's chart:   . Medical and social history . Use of alcohol, tobacco or illicit drugs  . Current medications and supplements . Functional ability and status . Nutritional status . Physical activity . Advanced directives . List of other physicians . Hospitalizations, surgeries, and ER visits in previous 12 months . Vitals . Screenings to include cognitive, depression, and falls . Referrals and appointments  In addition, I have reviewed and discussed with Tracey Juarez certain preventive protocols, quality metrics, and best practice recommendations. A written personalized care plan for preventive services as well as general preventive health recommendations is available and can be mailed to the patient at her request.      Maud Deed [facility]  [phone]

## 2020-11-30 NOTE — Patient Instructions (Signed)
  [place] Maintenance Summary and Written Plan of Care  [name] ,  Thank you for allowing me to perform your Medicare Annual Wellness Visit and for your ongoing commitment to your health.   Health Maintenance & Immunization History Health Maintenance  Topic Date Due  . COVID-19 Vaccine (4 - Booster for Moderna series) 12/03/2020  . INFLUENZA VACCINE  03/11/2021  . TETANUS/TDAP  06/01/2021  . DEXA SCAN  Completed  . PNA vac Low Risk Adult  Completed  . HPV VACCINES  Aged Out  . PAP SMEAR-Modifier  Discontinued  . Hepatitis C Screening  Discontinued   Immunization History  Administered Date(s) Administered  . Influenza, High Dose Seasonal PF 05/09/2013, 05/08/2014, 05/20/2017, 05/26/2018, 05/03/2019  . Influenza,inj,Quad PF,6+ Mos 05/15/2015  . Influenza-Unspecified 05/15/2015, 05/07/2016, 05/20/2017, 05/03/2019  . Moderna Sars-Covid-2 Vaccination 08/31/2019, 09/28/2019, 06/04/2020  . Pneumococcal Conjugate-13 08/15/2013  . Pneumococcal Polysaccharide-23 12/18/2017  . Tdap 06/02/2011  . Zoster Recombinat (Shingrix) 11/19/2020    These are the patient goals that we discussed: Goals Addressed            This Visit's Progress   . Exercise 3x per week (30 min per time)   On track    11/24/2019 AWV Goal: Exercise for General Health   Patient will verbalize understanding of the benefits of increased physical activity:  Exercising regularly is important. It will improve your overall fitness, flexibility, and endurance.  Regular exercise also will improve your overall health. It can help you control your weight, reduce stress, and improve your bone density.  Over the next year, patient will increase physical activity as tolerated with a goal of at least 150 minutes of moderate physical activity per week.   You can tell that you are exercising at a moderate intensity if your heart starts beating faster and you start breathing faster but can  still hold a conversation.  Moderate-intensity exercise ideas include:  Walking 1 mile (1.6 km) in about 15 minutes  Biking  Hiking  Golfing  Dancing  Water aerobics  Patient will verbalize understanding of everyday activities that increase physical activity by providing examples like the following: ? Yard work, such as: ? Pushing a Conservation officer, nature ? Raking and bagging leaves ? Washing your car ? Pushing a stroller ? Shoveling snow ? Gardening ? Washing windows or floors  Patient will be able to explain general safety guidelines for exercising:   Before you start a new exercise program, talk with your health care provider.  Do not exercise so much that you hurt yourself, feel dizzy, or get very short of breath.  Wear comfortable clothes and wear shoes with good support.  Drink plenty of water while you exercise to prevent dehydration or heat stroke.  Work out until your breathing and your heartbeat get faster.     . Prevent falls   On track       This is a list of Health Maintenance Items that are overdue or due now: Health Maintenance Due  Topic Date Due  . COVID-19 Vaccine (4 - Booster for Moderna series) 12/03/2020     Orders/Referrals Placed Today: No orders of the defined types were placed in this encounter.  (Contact our referral department at [phone] if you have not spoken with someone about your referral appointment within the next 5 days)    Follow-up Plan  Scheduled with Dettinger 03/21/21 at 9:10am

## 2020-12-04 ENCOUNTER — Other Ambulatory Visit: Payer: Self-pay

## 2020-12-04 ENCOUNTER — Encounter: Payer: Self-pay | Admitting: Nurse Practitioner

## 2020-12-04 ENCOUNTER — Ambulatory Visit (INDEPENDENT_AMBULATORY_CARE_PROVIDER_SITE_OTHER): Payer: Medicare PPO | Admitting: Nurse Practitioner

## 2020-12-04 VITALS — BP 154/69 | HR 68 | Temp 98.3°F | Resp 20 | Ht 61.0 in | Wt 145.0 lb

## 2020-12-04 DIAGNOSIS — M5431 Sciatica, right side: Secondary | ICD-10-CM

## 2020-12-04 MED ORDER — PREDNISONE 20 MG PO TABS: 40.0000 mg | ORAL_TABLET | Freq: Every day | ORAL | 0 refills | Status: AC

## 2020-12-04 NOTE — Patient Instructions (Signed)
Sciatica  Sciatica is pain, weakness, tingling, or loss of feeling (numbness) along the sciatic nerve. The sciatic nerve starts in the lower back and goes down the back of each leg. Sciatica usually goes away on its own or with treatment. Sometimes, sciatica may come back (recur). What are the causes? This condition happens when the sciatic nerve is pinched or has pressure put on it. This may be the result of:  A disk in between the bones of the spine bulging out too far (herniated disk).  Changes in the spinal disks that occur with aging.  A condition that affects a muscle in the butt.  Extra bone growth near the sciatic nerve.  A break (fracture) of the area between your hip bones (pelvis).  Pregnancy.  Tumor. This is rare. What increases the risk? You are more likely to develop this condition if you:  Play sports that put pressure or stress on the spine.  Have poor strength and ease of movement (flexibility).  Have had a back injury in the past.  Have had back surgery.  Sit for long periods of time.  Do activities that involve bending or lifting over and over again.  Are very overweight (obese). What are the signs or symptoms? Symptoms can vary from mild to very bad. They may include:  Any of these problems in the lower back, leg, hip, or butt: ? Mild tingling, loss of feeling, or dull aches. ? Burning sensations. ? Sharp pains.  Loss of feeling in the back of the calf or the sole of the foot.  Leg weakness.  Very bad back pain that makes it hard to move. These symptoms may get worse when you cough, sneeze, or laugh. They may also get worse when you sit or stand for long periods of time. How is this treated? This condition often gets better without any treatment. However, treatment may include:  Changing or cutting back on physical activity when you have pain.  Doing exercises and stretching.  Putting ice or heat on the affected area.  Medicines that  help: ? To relieve pain and swelling. ? To relax your muscles.  Shots (injections) of medicines that help to relieve pain, irritation, and swelling.  Surgery. Follow these instructions at home: Medicines  Take over-the-counter and prescription medicines only as told by your doctor.  Ask your doctor if the medicine prescribed to you: ? Requires you to avoid driving or using heavy machinery. ? Can cause trouble pooping (constipation). You may need to take these steps to prevent or treat trouble pooping:  Drink enough fluids to keep your pee (urine) pale yellow.  Take over-the-counter or prescription medicines.  Eat foods that are high in fiber. These include beans, whole grains, and fresh fruits and vegetables.  Limit foods that are high in fat and sugar. These include fried or sweet foods. Managing pain  If told, put ice on the affected area. ? Put ice in a plastic bag. ? Place a towel between your skin and the bag. ? Leave the ice on for 20 minutes, 2-3 times a day.  If told, put heat on the affected area. Use the heat source that your doctor tells you to use, such as a moist heat pack or a heating pad. ? Place a towel between your skin and the heat source. ? Leave the heat on for 20-30 minutes. ? Remove the heat if your skin turns bright red. This is very important if you are unable to feel pain,   heat, or cold. You may have a greater risk of getting burned.      Activity  Return to your normal activities as told by your doctor. Ask your doctor what activities are safe for you.  Avoid activities that make your symptoms worse.  Take short rests during the day. ? When you rest for a long time, do some physical activity or stretching between periods of rest. ? Avoid sitting for a long time without moving. Get up and move around at least one time each hour.  Exercise and stretch regularly, as told by your doctor.  Do not lift anything that is heavier than 10 lb (4.5 kg)  while you have symptoms of sciatica. ? Avoid lifting heavy things even when you do not have symptoms. ? Avoid lifting heavy things over and over.  When you lift objects, always lift in a way that is safe for your body. To do this, you should: ? Bend your knees. ? Keep the object close to your body. ? Avoid twisting.   General instructions  Stay at a healthy weight.  Wear comfortable shoes that support your feet. Avoid wearing high heels.  Avoid sleeping on a mattress that is too soft or too hard. You might have less pain if you sleep on a mattress that is firm enough to support your back.  Keep all follow-up visits as told by your doctor. This is important. Contact a doctor if:  You have pain that: ? Wakes you up when you are sleeping. ? Gets worse when you lie down. ? Is worse than the pain you have had in the past. ? Lasts longer than 4 weeks.  You lose weight without trying. Get help right away if:  You cannot control when you pee (urinate) or poop (have a bowel movement).  You have weakness in any of these areas and it gets worse: ? Lower back. ? The area between your hip bones. ? Butt. ? Legs.  You have redness or swelling of your back.  You have a burning feeling when you pee. Summary  Sciatica is pain, weakness, tingling, or loss of feeling (numbness) along the sciatic nerve.  This condition happens when the sciatic nerve is pinched or has pressure put on it.  Sciatica can cause pain, tingling, or loss of feeling (numbness) in the lower back, legs, hips, and butt.  Treatment often includes rest, exercise, medicines, and putting ice or heat on the affected area. This information is not intended to replace advice given to you by your health care provider. Make sure you discuss any questions you have with your health care provider. Document Revised: 08/16/2018 Document Reviewed: 08/16/2018 Elsevier Patient Education  2021 Elsevier Inc.  

## 2020-12-04 NOTE — Progress Notes (Signed)
   Subjective:    Patient ID: Tracey Juarez, female    DOB: 02/02/1941, 80 y.o.   MRN: [id number]   Chief Complaint: Hip Pain (Right for 3 weeks/)   HPI Patient comes in today c/o right hip. Started 3 weeks ago. Pain is intermittent. Sitting in straight chair increases pain. She has taken tylenol arthritis which helps some. When asked she points to her buttocks for pain.   Review of Systems  Musculoskeletal: Positive for arthralgias (right hip) and back pain.  All other systems reviewed and are negative.      Objective:   Physical Exam Vitals and nursing note reviewed.  Cardiovascular:     Rate and Rhythm: Normal rate and regular rhythm.     Heart sounds: Normal heart sounds.  Pulmonary:     Breath sounds: Normal breath sounds.  Musculoskeletal:     Comments: Pain in buttocks when kicks leg forward.  FROM of right hip without pain (-) SLR bil mtor strength and sensation distally intact  Neurological:     Mental Status: She is alert.    BP (!) 154/69   Pulse 68   Temp 98.3 F (36.8 C) (Temporal)   Resp 20   Ht 5\' 1"  (1.549 m)   Wt 145 lb (65.8 kg)   SpO2 98%   BMI 27.40 kg/m         Assessment & Plan:  Tracey Juarez in today with chief complaint of Hip Pain (Right for 3 weeks/)   1. Sciatica of right side Moist heat Rest RTO prn - predniSONE (DELTASONE) 20 MG tablet; Take 2 tablets (40 mg total) by mouth daily with breakfast for 5 days. 2 po daily for 5 days  Dispense: 10 tablet; Refill: 0    The above assessment and management plan was discussed with the patient. The patient verbalized understanding of and has agreed to the management plan. Patient is aware to call the clinic if symptoms persist or worsen. Patient is aware when to return to the clinic for a follow-up visit. Patient educated on when it is appropriate to go to the emergency department.   Mary-Margaret Hassell Done, FNP

## 2020-12-12 ENCOUNTER — Other Ambulatory Visit: Payer: Medicare PPO

## 2020-12-19 ENCOUNTER — Ambulatory Visit (INDEPENDENT_AMBULATORY_CARE_PROVIDER_SITE_OTHER): Payer: Medicare PPO

## 2020-12-19 ENCOUNTER — Other Ambulatory Visit: Payer: Self-pay

## 2020-12-19 DIAGNOSIS — Z780 Asymptomatic menopausal state: Secondary | ICD-10-CM

## 2020-12-19 DIAGNOSIS — M8589 Other specified disorders of bone density and structure, multiple sites: Secondary | ICD-10-CM

## 2020-12-25 DIAGNOSIS — M85852 Other specified disorders of bone density and structure, left thigh: Secondary | ICD-10-CM | POA: Diagnosis not present

## 2020-12-25 DIAGNOSIS — Z780 Asymptomatic menopausal state: Secondary | ICD-10-CM | POA: Diagnosis not present

## 2021-01-08 ENCOUNTER — Other Ambulatory Visit: Payer: Self-pay | Admitting: Family Medicine

## 2021-01-08 DIAGNOSIS — R42 Dizziness and giddiness: Secondary | ICD-10-CM

## 2021-01-08 DIAGNOSIS — H9312 Tinnitus, left ear: Secondary | ICD-10-CM

## 2021-01-30 DIAGNOSIS — R32 Unspecified urinary incontinence: Secondary | ICD-10-CM | POA: Diagnosis not present

## 2021-01-30 DIAGNOSIS — R918 Other nonspecific abnormal finding of lung field: Secondary | ICD-10-CM | POA: Diagnosis not present

## 2021-01-30 DIAGNOSIS — S7002XA Contusion of left hip, initial encounter: Secondary | ICD-10-CM | POA: Diagnosis not present

## 2021-01-30 DIAGNOSIS — M25522 Pain in left elbow: Secondary | ICD-10-CM | POA: Diagnosis not present

## 2021-01-30 DIAGNOSIS — N281 Cyst of kidney, acquired: Secondary | ICD-10-CM | POA: Diagnosis not present

## 2021-01-30 DIAGNOSIS — S066X0A Traumatic subarachnoid hemorrhage without loss of consciousness, initial encounter: Secondary | ICD-10-CM | POA: Diagnosis not present

## 2021-01-30 DIAGNOSIS — Z853 Personal history of malignant neoplasm of breast: Secondary | ICD-10-CM | POA: Diagnosis not present

## 2021-01-30 DIAGNOSIS — S7012XA Contusion of left thigh, initial encounter: Secondary | ICD-10-CM | POA: Diagnosis not present

## 2021-01-30 DIAGNOSIS — Z043 Encounter for examination and observation following other accident: Secondary | ICD-10-CM | POA: Diagnosis not present

## 2021-01-30 DIAGNOSIS — W19XXXA Unspecified fall, initial encounter: Secondary | ICD-10-CM | POA: Diagnosis not present

## 2021-01-30 DIAGNOSIS — S0083XA Contusion of other part of head, initial encounter: Secondary | ICD-10-CM | POA: Diagnosis not present

## 2021-01-30 DIAGNOSIS — G20 Parkinson's disease: Secondary | ICD-10-CM | POA: Diagnosis not present

## 2021-01-30 DIAGNOSIS — E785 Hyperlipidemia, unspecified: Secondary | ICD-10-CM | POA: Diagnosis not present

## 2021-01-30 DIAGNOSIS — I10 Essential (primary) hypertension: Secondary | ICD-10-CM | POA: Diagnosis not present

## 2021-01-30 DIAGNOSIS — J9811 Atelectasis: Secondary | ICD-10-CM | POA: Diagnosis not present

## 2021-01-30 DIAGNOSIS — I609 Nontraumatic subarachnoid hemorrhage, unspecified: Secondary | ICD-10-CM | POA: Diagnosis not present

## 2021-01-30 DIAGNOSIS — I608 Other nontraumatic subarachnoid hemorrhage: Secondary | ICD-10-CM | POA: Diagnosis not present

## 2021-01-31 DIAGNOSIS — S0083XA Contusion of other part of head, initial encounter: Secondary | ICD-10-CM | POA: Diagnosis not present

## 2021-01-31 DIAGNOSIS — S7002XA Contusion of left hip, initial encounter: Secondary | ICD-10-CM | POA: Diagnosis not present

## 2021-01-31 DIAGNOSIS — E785 Hyperlipidemia, unspecified: Secondary | ICD-10-CM | POA: Diagnosis not present

## 2021-01-31 DIAGNOSIS — K219 Gastro-esophageal reflux disease without esophagitis: Secondary | ICD-10-CM | POA: Diagnosis not present

## 2021-01-31 DIAGNOSIS — R32 Unspecified urinary incontinence: Secondary | ICD-10-CM | POA: Diagnosis not present

## 2021-01-31 DIAGNOSIS — Z853 Personal history of malignant neoplasm of breast: Secondary | ICD-10-CM | POA: Diagnosis not present

## 2021-01-31 DIAGNOSIS — I10 Essential (primary) hypertension: Secondary | ICD-10-CM | POA: Diagnosis not present

## 2021-01-31 DIAGNOSIS — S066X9A Traumatic subarachnoid hemorrhage with loss of consciousness of unspecified duration, initial encounter: Secondary | ICD-10-CM | POA: Diagnosis not present

## 2021-01-31 DIAGNOSIS — G20 Parkinson's disease: Secondary | ICD-10-CM | POA: Diagnosis not present

## 2021-02-08 ENCOUNTER — Encounter: Payer: Self-pay | Admitting: Family Medicine

## 2021-02-08 ENCOUNTER — Other Ambulatory Visit: Payer: Self-pay

## 2021-02-08 ENCOUNTER — Ambulatory Visit (INDEPENDENT_AMBULATORY_CARE_PROVIDER_SITE_OTHER): Payer: Medicare PPO | Admitting: Family Medicine

## 2021-02-08 VITALS — BP 140/70 | HR 89 | Ht 61.0 in | Wt 141.0 lb

## 2021-02-08 DIAGNOSIS — G20 Parkinson's disease: Secondary | ICD-10-CM

## 2021-02-08 DIAGNOSIS — W19XXXD Unspecified fall, subsequent encounter: Secondary | ICD-10-CM

## 2021-02-08 DIAGNOSIS — Z9181 History of falling: Secondary | ICD-10-CM

## 2021-02-08 NOTE — Progress Notes (Addendum)
BP 140/70   Pulse 89   Ht 5\' 1"  (1.549 m)   Wt 141 lb (64 kg)   SpO2 97%   BMI 26.64 kg/m    Subjective:   Patient ID: Tracey Juarez, female    DOB: Feb 13, 1941, 80 y.o.   MRN: [id number]  HPI: [place] Tracey Juarez is a 80 y.o. female presenting on 02/08/2021 for ER follow up (Fall)   HPI Patient is coming today for ER follow-up for a fall.  She says she had a fall on 01/30/2021 at [facility].  She says she was going to the hospital for her annual checkup for her DBS Surgery and their new construction so they had golf carts transporting people to and from the hospital and when she went to get on the back of the golf cart she was not quite on it when it took off and then she fell off the golf cart and landed on her left shoulder and the left side of her face and her left hip and had significant bruising up and down her left hip and in the left shoulder and on the left side of her face.  She says that they told her she had a small subdural hematoma that they rescan it was not growing in size.  She denies any headaches or lightheadedness or dizziness currently.  Her blood pressure was running down after the event but is running back to way that it normally is here today.  She says she still has some pain in the left hip and stiffness and also in the left shoulder as well.  It hurts more with overhead range of motion with that left shoulder.  She denies any numbness or weakness.  She still has a lot of bruising on the left side of her face as well.  She says things are improving but just not there yet.  Relevant past medical, surgical, family and social history reviewed and updated as indicated. Interim medical history since our last visit reviewed. Allergies and medications reviewed and updated.  Review of Systems  Constitutional:  Negative for chills and fever.  Eyes:  Negative for visual disturbance.  Respiratory:  Negative for chest tightness and shortness of breath.    Cardiovascular:  Negative for chest pain and leg swelling.  Musculoskeletal:  Positive for arthralgias, gait problem and joint swelling. Negative for back pain.  Skin:  Positive for color change. Negative for rash and wound.  Neurological:  Negative for light-headedness and headaches.  Psychiatric/Behavioral:  Negative for agitation and behavioral problems.   All other systems reviewed and are negative.  Per HPI unless specifically indicated above   Allergies as of 02/08/2021       Reactions   Ondansetron Swelling, Palpitations        Medication List        Accurate as of February 08, 2021  1:14 PM. If you have any questions, ask your nurse or doctor.          STOP taking these medications    Fiber Therapy 25 % Powd Generic drug: Psyllium Stopped by: Fransisca Kaufmann Quang Thorpe, MD       TAKE these medications    acetaminophen 325 MG tablet Commonly known as: TYLENOL Take 650 mg by mouth every 6 (six) hours as needed.   anastrozole 1 MG tablet Commonly known as: ARIMIDEX Take 1 tablet (1 mg total) by mouth daily.   Carbidopa-Levodopa ER 25-100 MG tablet controlled release Commonly known as: SINEMET  CR Take 1 tablet by mouth 3 (three) times daily.   cetirizine 10 MG tablet Commonly known as: ZYRTEC Take 10 mg by mouth daily.   clopidogrel 75 MG tablet Commonly known as: PLAVIX Take 75 mg by mouth daily.   diphenhydrAMINE-zinc acetate cream Commonly known as: BENADRYL Apply 1 application topically 3 (three) times daily as needed for itching.   famotidine 10 MG tablet Commonly known as: PEPCID Take 10 mg by mouth daily.   fluticasone 50 MCG/ACT nasal spray Commonly known as: FLONASE Place 1 spray into both nostrils 2 (two) times daily as needed for allergies or rhinitis.   Ginger Root 550 MG Caps Take by mouth.   lisinopril 10 MG tablet Commonly known as: ZESTRIL Take 1 tablet (10 mg total) by mouth daily.   Magnesium 250 MG Tabs Take 1 tablet by mouth  daily.   meclizine 12.5 MG tablet Commonly known as: ANTIVERT TAKE 1 TO 2 TABLETS BY MOUTH THREE TIMES DAILY AS NEEDED FOR DIZZINESS   multivitamin capsule Take 1 capsule by mouth daily.   oxybutynin 10 MG 24 hr tablet Commonly known as: DITROPAN-XL Take 1 tablet (10 mg total) by mouth at bedtime.   Risedronate Sodium 35 MG Tbec Take 1 tablet (35 mg total) by mouth once a week.   rosuvastatin 10 MG tablet Commonly known as: CRESTOR Take 1 tablet (10 mg total) by mouth daily.   Vitamin D3 1.25 MG (50000 UT) Tabs Take by mouth. Taking 4,000 ui daily         Objective:   BP 140/70   Pulse 89   Ht 5\' 1"  (1.549 m)   Wt 141 lb (64 kg)   SpO2 97%   BMI 26.64 kg/m   Wt Readings from Last 3 Encounters:  02/08/21 141 lb (64 kg)  12/04/20 145 lb (65.8 kg)  11/19/20 146 lb (66.2 kg)    Physical Exam Vitals and nursing note reviewed.  Constitutional:      General: She is not in acute distress.    Appearance: She is well-developed. She is not diaphoretic.  Eyes:     Conjunctiva/sclera: Conjunctivae normal.     Pupils: Pupils are equal, round, and reactive to light.  Cardiovascular:     Rate and Rhythm: Normal rate and regular rhythm.     Heart sounds: Normal heart sounds. No murmur heard. Pulmonary:     Effort: Pulmonary effort is normal. No respiratory distress.     Breath sounds: Normal breath sounds. No wheezing.  Musculoskeletal:     Left shoulder: Tenderness (Pain with overhead range of motion on the anterior aspect near the biceps tendon groove) present. No swelling, deformity or bony tenderness. Normal range of motion. Normal strength.     Left hip: Tenderness present. No deformity, bony tenderness or crepitus. Normal range of motion.  Skin:    General: Skin is warm and dry.     Findings: No rash.       Neurological:     Mental Status: She is alert and oriented to person, place, and time.     Coordination: Coordination normal.  Psychiatric:         Behavior: Behavior normal.      Assessment & Plan:   Problem List Items Addressed This Visit       Nervous and Auditory   Parkinson disease ([place])   Other Visit Diagnoses     Fall, subsequent encounter    -  Primary  No signs of fracture, appears to be doing well blood pressure is doing good today, continue range of motion and strengthening exercises at home. Follow up plan: Return if symptoms worsen or fail to improve.  Counseling provided for all of the vaccine components No orders of the defined types were placed in this encounter.   Caryl Pina, MD [facility] 02/08/2021, 1:14 PM

## 2021-02-12 ENCOUNTER — Other Ambulatory Visit: Payer: Self-pay

## 2021-02-12 ENCOUNTER — Emergency Department (HOSPITAL_COMMUNITY)
Admission: EM | Admit: 2021-02-12 | Discharge: 2021-02-12 | Disposition: A | Discharge status: Home / Self Care | Payer: Medicare PPO | Attending: Emergency Medicine | Admitting: Emergency Medicine

## 2021-02-12 ENCOUNTER — Encounter (HOSPITAL_COMMUNITY): Payer: Self-pay

## 2021-02-12 ENCOUNTER — Emergency Department (HOSPITAL_COMMUNITY): Payer: Medicare PPO

## 2021-02-12 DIAGNOSIS — S8992XA Unspecified injury of left lower leg, initial encounter: Secondary | ICD-10-CM | POA: Diagnosis present

## 2021-02-12 DIAGNOSIS — Z7902 Long term (current) use of antithrombotics/antiplatelets: Secondary | ICD-10-CM | POA: Insufficient documentation

## 2021-02-12 DIAGNOSIS — S8012XA Contusion of left lower leg, initial encounter: Secondary | ICD-10-CM | POA: Diagnosis not present

## 2021-02-12 DIAGNOSIS — Z85828 Personal history of other malignant neoplasm of skin: Secondary | ICD-10-CM | POA: Diagnosis not present

## 2021-02-12 DIAGNOSIS — G20 Parkinson's disease: Secondary | ICD-10-CM | POA: Diagnosis not present

## 2021-02-12 DIAGNOSIS — M7989 Other specified soft tissue disorders: Secondary | ICD-10-CM | POA: Insufficient documentation

## 2021-02-12 DIAGNOSIS — Z853 Personal history of malignant neoplasm of breast: Secondary | ICD-10-CM | POA: Insufficient documentation

## 2021-02-12 DIAGNOSIS — T148XXA Other injury of unspecified body region, initial encounter: Secondary | ICD-10-CM

## 2021-02-12 DIAGNOSIS — I10 Essential (primary) hypertension: Secondary | ICD-10-CM | POA: Insufficient documentation

## 2021-02-12 DIAGNOSIS — Z79899 Other long term (current) drug therapy: Secondary | ICD-10-CM | POA: Insufficient documentation

## 2021-02-12 NOTE — Discharge Instructions (Addendum)
The your ultrasound for the leg swelling is negative for any blood clots.  As indicated, we suspect that you were just going through sequelae of trauma you incurred few days back.  Warm compresses to the leg should be helpful along with over-the-counter pain medication.

## 2021-02-12 NOTE — ED Provider Notes (Signed)
[facility] EMERGENCY DEPARTMENT Provider Note   CSN: [id number] Arrival date & time: 02/12/21  1002     History Chief Complaint  Patient presents with   [facility] Tracey Juarez is a 80 y.o. female.  HPI     80 year old female comes in with chief complaint of swelling and discomfort in her left leg.  She had a fall about 2 weeks ago at [facility].  Patient fell onto concrete surface from golf cart.  She was taken to the ER immediately and had work-up done that showed hemorrhage and hematoma of the left thigh along with subdural hematoma.  Patient states that after being discharged, she has had increased bruising in the legs and more recently she has had increased swelling to the foot and ankle area and her leg as well.  She got concerned about blood clot and decided to come to the ER.  No history of blood clots.   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   Detrusor instability    Elevated cholesterol    GERD (gastroesophageal reflux disease)    Hot flashes    Humerus head fracture, left, with routine healing, subsequent encounter 2000   Hypertension    Melanoma ([place])    Mixed basal-squamous cell carcinoma    skin   Parkinson disease ([place])    Personal history of radiation therapy    Stroke ([place])    on Plavix, no deficits    Patient Active Problem List   Diagnosis Date Noted   Osteoarthritis, multiple sites 02/03/2019   History of CVA (cerebrovascular accident) 02/03/2019   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   Senile purpura ([place]) 03/04/2017   Pure hypercholesterolemia 03/04/2017   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   Osteopenia 08/15/2013   Parkinson disease ([place])    Hypertension    Atrophic vaginitis    Detrusor instability     Melanoma ([place])    Mixed basal-squamous cell carcinoma     Past Surgical History:  Procedure Laterality Date   BRAIN SURGERY     BREAST LUMPECTOMY Left 04/2015   radiation   BREAST LUMPECTOMY WITH RADIOACTIVE SEED AND SENTINEL LYMPH NODE BIOPSY Left 04/25/2015   Procedure: LEFT BREAST PARTIAL MASTECTOMY WITH RADIOACTIVE SEED AND SENTINEL LYMPH NODE MAPPING;  Surgeon: Erroll Luna, MD;  Location: [place];  Service: General;  Laterality: Left;   FOOT SURGERY  2004   KNEE SURGERY  2007   skin cancer excised     TONSILLECTOMY       OB History     Gravida  0   Para  0   Term  0   Preterm  0   AB  0   Living  0      SAB  0   IAB  0   Ectopic  0   Multiple  0   Live Births  0           Family History  Problem Relation Age of Onset   Hypertension Mother    Heart attack Mother    Heart attack Maternal Aunt    Heart attack Maternal Uncle    Breast cancer Neg Hx     Social History   Tobacco Use   Smoking status:  Never   Smokeless tobacco: Never  Vaping Use   Vaping Use: Never used  Substance Use Topics   Alcohol use: No    Alcohol/week: 0.0 standard drinks   Drug use: No    Home Medications Prior to Admission medications   Medication Sig Start Date End Date Taking? Authorizing Provider  acetaminophen (TYLENOL) 325 MG tablet Take 650 mg by mouth every 6 (six) hours as needed.    [provider]  anastrozole (ARIMIDEX) 1 MG tablet Take 1 tablet (1 mg total) by mouth daily. 10/04/20   Truitt Merle, MD  Carbidopa-Levodopa ER (SINEMET CR) 25-100 MG tablet controlled release Take 1 tablet by mouth 3 (three) times daily. 01/12/19   [provider]  cetirizine (ZYRTEC) 10 MG tablet Take 10 mg by mouth daily.    [provider]  Cholecalciferol (VITAMIN D3) 1.25 MG (50000 UT) TABS Take by mouth. Taking 4,000 ui daily    [provider]  clopidogrel (PLAVIX) 75 MG tablet Take 75 mg by mouth daily.    [provider]  diphenhydrAMINE-zinc acetate (BENADRYL) cream Apply 1 application topically 3 (three) times daily as needed for itching. 12/13/19   Ivy Lynn, NP  famotidine (PEPCID) 10 MG tablet Take 10 mg by mouth daily.    [provider]  fluticasone (FLONASE) 50 MCG/ACT nasal spray Place 1 spray into both nostrils 2 (two) times daily as needed for allergies or rhinitis. 04/23/20   Dettinger, Fransisca Kaufmann, MD  Ginger, Zingiber officinalis, (GINGER ROOT) 550 MG CAPS Take by mouth.    [provider]  lisinopril (ZESTRIL) 10 MG tablet Take 1 tablet (10 mg total) by mouth daily. 07/19/20   Dettinger, Fransisca Kaufmann, MD  Magnesium 250 MG TABS Take 1 tablet by mouth daily.    [provider]  meclizine (ANTIVERT) 12.5 MG tablet TAKE 1 TO 2 TABLETS BY MOUTH THREE TIMES DAILY AS NEEDED FOR DIZZINESS 01/08/21   Dettinger, Fransisca Kaufmann, MD  Multiple Vitamin (MULTIVITAMIN) capsule Take 1 capsule by mouth daily.    [provider]  oxybutynin (DITROPAN-XL) 10 MG 24 hr tablet Take 1 tablet (10 mg total) by mouth at bedtime. 07/19/20   Dettinger, Fransisca Kaufmann, MD  Risedronate Sodium 35 MG TBEC Take 1 tablet (35 mg total) by mouth once a week. 07/19/20   Dettinger, Fransisca Kaufmann, MD  rosuvastatin (CRESTOR) 10 MG tablet Take 1 tablet (10 mg total) by mouth daily. 07/19/20   Dettinger, Fransisca Kaufmann, MD    Allergies    Ondansetron  Review of Systems   Review of Systems  Constitutional:  Positive for activity change.  Musculoskeletal:  Positive for myalgias.  Skin:  Positive for rash.  Hematological:  Does not bruise/bleed easily.   Physical Exam Updated Vital Signs BP (!) 141/57   Pulse 60   Temp 98.2 F (36.8 C) (Oral)   Resp 18   Ht 5\' 2"  (1.575 m)   Wt 64.4 kg   SpO2 99%   BMI 25.97 kg/m   Physical Exam Vitals and nursing note reviewed.  Constitutional:      Appearance: She is well-developed.  HENT:     Head: Atraumatic.  Cardiovascular:     Rate and Rhythm: Normal rate.   Pulmonary:     Effort: Pulmonary effort is normal.  Musculoskeletal:        General: Swelling and tenderness present.     Cervical back: Normal range of motion and neck supple.  Comments: Tenderness to palpation over the edematous appearing leg diffusely.  The edema is mild.  There is diffuse bruising to the leg  Skin:    General: Skin is warm and dry.     Findings: Bruising present.  Neurological:     Mental Status: She is alert and oriented to person, place, and time.    ED Results / Procedures / Treatments   Labs (all labs ordered are listed, but only abnormal results are displayed) Labs Reviewed - No data to display  EKG None  Radiology US Venous Img Lower Unilateral Left  Result Date: 02/12/2021 CLINICAL DATA:  Left lower extremity swelling EXAM: LEFT LOWER EXTREMITY VENOUS DOPPLER ULTRASOUND TECHNIQUE: Gray-scale sonography with graded compression, as well as color Doppler and duplex ultrasound were performed to evaluate the lower extremity deep venous systems from the level of the common femoral vein and including the common femoral, femoral, profunda femoral, popliteal and calf veins including the posterior tibial, peroneal and gastrocnemius veins when visible. The superficial great saphenous vein was also interrogated. Spectral Doppler was utilized to evaluate flow at rest and with distal augmentation maneuvers in the common femoral, femoral and popliteal veins. COMPARISON:  None. FINDINGS: Contralateral Common Femoral Vein: Respiratory phasicity is normal and symmetric with the symptomatic side. No evidence of thrombus. Normal compressibility. Common Femoral Vein: No evidence of thrombus. Normal compressibility, respiratory phasicity and response to augmentation. Saphenofemoral Junction: No evidence of thrombus. Normal compressibility and flow on color Doppler imaging. Profunda Femoral Vein: No evidence of thrombus. Normal compressibility and flow on color Doppler imaging.  Femoral Vein: No evidence of thrombus. Normal compressibility, respiratory phasicity and response to augmentation. Popliteal Vein: No evidence of thrombus. Normal compressibility, respiratory phasicity and response to augmentation. Calf Veins: No evidence of thrombus. Normal compressibility and flow on color Doppler imaging. Superficial Great Saphenous Vein: No evidence of thrombus. Normal compressibility. Venous Reflux:  None. Other Findings:  None. IMPRESSION: No evidence of left lower extremity deep venous thrombosis. Electronically Signed   By: Davina Poke D.O.   On: 02/12/2021 12:03    Procedures Procedures   Medications Ordered in ED Medications - No data to display  ED Course  I have reviewed the triage vital signs and the nursing notes.  Pertinent labs & imaging results that were available during my care of the patient were reviewed by me and considered in my medical decision making (see chart for details).    MDM Rules/Calculators/A&P                          80 year old female comes in with chief complaint of worsening leg swelling and discomfort.  She had a mechanical fall about 2 weeks ago from a cart.  In the process she had a large hematoma and hemorrhage over her left thigh.  The ecchymosis is just sequelae of that trauma.  I have reviewed the CT scan from care everywhere. I suspect that even the edema and discomfort she is having in her leg is a result of the spreading hematoma.  We will get DVT study here.  If negative, she will be discharged with outpatient follow-up and warm compresses.  Final Clinical Impression(s) / ED Diagnoses Final diagnoses:  Hematoma  Hematoma and contusion    Rx / DC Orders ED Discharge Orders     None        Varney Biles, MD 02/12/21 1227

## 2021-02-12 NOTE — ED Triage Notes (Signed)
Pt presents to ED following fall from 6/22. Pt states she fell off the back of a golf cart, hit her head on cement and the left side of her body. Pt states the bruise on her left thigh has gotten worse.

## 2021-02-15 ENCOUNTER — Telehealth: Payer: Self-pay | Admitting: Family Medicine

## 2021-02-15 NOTE — Telephone Encounter (Signed)
Is she talking about her DVT ultrasound that she had done a few days ago by another provider, if she is talking about that then it looks like it was negative, no signs of DVT.

## 2021-02-15 NOTE — Telephone Encounter (Signed)
Please review patients [place] result and call patient with result ASAP. Pt also wants to know if she needs to see Dr Dettinger for follow up or not?

## 2021-02-15 NOTE — Telephone Encounter (Signed)
Lmtcb.

## 2021-02-18 NOTE — Telephone Encounter (Signed)
Patient is aware of ultrasound results.  She states she is still having some swelling in her leg and would like to be seen.  Appointment scheduled on 02/19/21 at 2:35 pm with Hendricks Limes, patient aware.

## 2021-02-18 NOTE — Telephone Encounter (Signed)
Pt is returning nurse call.

## 2021-02-19 ENCOUNTER — Encounter: Payer: Self-pay | Admitting: Family Medicine

## 2021-02-19 ENCOUNTER — Other Ambulatory Visit: Payer: Self-pay

## 2021-02-19 ENCOUNTER — Ambulatory Visit (INDEPENDENT_AMBULATORY_CARE_PROVIDER_SITE_OTHER): Payer: Medicare PPO | Admitting: Family Medicine

## 2021-02-19 VITALS — BP 152/73 | HR 68 | Temp 98.4°F | Ht 62.0 in | Wt 141.0 lb

## 2021-02-19 DIAGNOSIS — S8992XA Unspecified injury of left lower leg, initial encounter: Secondary | ICD-10-CM | POA: Diagnosis not present

## 2021-02-19 DIAGNOSIS — T148XXA Other injury of unspecified body region, initial encounter: Secondary | ICD-10-CM | POA: Diagnosis not present

## 2021-02-19 NOTE — Progress Notes (Addendum)
Assessment & Plan:  1. Hematoma Reassurance provided.  CBC ordered.  Discussed I expect her hemoglobin is coming up, and not decreasing further. - CBC with Differential/Platelet   Follow up plan: Return as scheduled with PCP.  Hendricks Limes, MSN, APRN, FNP-C [facility]  Subjective:   Patient ID: Tracey Juarez, female    DOB: Mar 27, 1941, 80 y.o.   MRN: [id number]  HPI: Tracey Juarez is a 80 y.o. female presenting on 02/19/2021 for ER follow up (Hematoma on left leg AP 7/5 )  Patient is following up on a hematoma over her left hip.  Her left leg continues to feel sore.  She does have some swelling in that leg as well.  She brought pictures with her of the leg after it first happened.  She had an ultrasound of the left leg on 02/12/2021 in the ER to rule out a DVT, which showed no evidence of left lower extremity DVT.  She is also concerned that her hemoglobin has not been rechecked, and was previously 11.2.  Upon chart review it does appear her hemoglobin was rechecked later the same day and had come up to 12.2.   ROS: Negative unless specifically indicated above in HPI.   Relevant past medical history reviewed and updated as indicated.   Allergies and medications reviewed and updated.   Current Outpatient Medications:    acetaminophen (TYLENOL) 325 MG tablet, Take 650 mg by mouth every 6 (six) hours as needed., Disp: , Rfl:    anastrozole (ARIMIDEX) 1 MG tablet, Take 1 tablet (1 mg total) by mouth daily., Disp: 90 tablet, Rfl: 2   Carbidopa-Levodopa ER (SINEMET CR) 25-100 MG tablet controlled release, Take 1 tablet by mouth 3 (three) times daily., Disp: , Rfl:    cetirizine (ZYRTEC) 10 MG tablet, Take 10 mg by mouth daily., Disp: , Rfl:    Cholecalciferol (VITAMIN D3) 1.25 MG (50000 UT) TABS, Take by mouth. Taking 4,000 ui daily, Disp: , Rfl:    clopidogrel (PLAVIX) 75 MG tablet, Take 75 mg by mouth daily., Disp: , Rfl:    diphenhydrAMINE-zinc  acetate (BENADRYL) cream, Apply 1 application topically 3 (three) times daily as needed for itching., Disp: 28.4 g, Rfl: 0   famotidine (PEPCID) 10 MG tablet, Take 10 mg by mouth daily., Disp: , Rfl:    fluticasone (FLONASE) 50 MCG/ACT nasal spray, Place 1 spray into both nostrils 2 (two) times daily as needed for allergies or rhinitis., Disp: 16 g, Rfl: 6   Ginger, Zingiber officinalis, (GINGER ROOT) 550 MG CAPS, Take by mouth., Disp: , Rfl:    lisinopril (ZESTRIL) 10 MG tablet, Take 1 tablet (10 mg total) by mouth daily., Disp: 90 tablet, Rfl: 3   Magnesium 250 MG TABS, Take 1 tablet by mouth daily., Disp: , Rfl:    meclizine (ANTIVERT) 12.5 MG tablet, TAKE 1 TO 2 TABLETS BY MOUTH THREE TIMES DAILY AS NEEDED FOR DIZZINESS, Disp: 60 tablet, Rfl: 0   Multiple Vitamin (MULTIVITAMIN) capsule, Take 1 capsule by mouth daily., Disp: , Rfl:    oxybutynin (DITROPAN-XL) 10 MG 24 hr tablet, Take 1 tablet (10 mg total) by mouth at bedtime., Disp: 90 tablet, Rfl: 3   Risedronate Sodium 35 MG TBEC, Take 1 tablet (35 mg total) by mouth once a week., Disp: 12 tablet, Rfl: 0   rosuvastatin (CRESTOR) 10 MG tablet, Take 1 tablet (10 mg total) by mouth daily., Disp: 90 tablet, Rfl: 3  Allergies  Allergen Reactions  Ondansetron Swelling and Palpitations    Objective:   BP (!) 152/73   Pulse 68   Temp 98.4 F (36.9 C) (Temporal)   Ht 5\' 2"  (1.575 m)   Wt 141 lb (64 kg)   SpO2 97%   BMI 25.79 kg/m    Physical Exam Vitals reviewed.  Constitutional:      General: She is not in acute distress.    Appearance: Normal appearance. She is not ill-appearing, toxic-appearing or diaphoretic.  HENT:     Head: Normocephalic and atraumatic.  Eyes:     General: No scleral icterus.       Right eye: No discharge.        Left eye: No discharge.     Conjunctiva/sclera: Conjunctivae normal.  Cardiovascular:     Rate and Rhythm: Normal rate.  Pulmonary:     Effort: Pulmonary effort is normal. No respiratory  distress.  Musculoskeletal:        General: Normal range of motion.     Cervical back: Normal range of motion.  Skin:    General: Skin is warm and dry.     Capillary Refill: Capillary refill takes less than 2 seconds.     Comments: Hematoma over left hip.  Bruising has significantly improved in comparison to the pictures that the patient brought with her.  Neurological:     General: No focal deficit present.     Mental Status: She is alert and oriented to person, place, and time. Mental status is at baseline.  Psychiatric:        Mood and Affect: Mood normal.        Behavior: Behavior normal.        Thought Content: Thought content normal.        Judgment: Judgment normal.

## 2021-02-20 LAB — CBC WITH DIFFERENTIAL/PLATELET
Basophils Absolute: 0 10*3/uL (ref 0.0–0.2)
Basos: 1 %
EOS (ABSOLUTE): 0 10*3/uL (ref 0.0–0.4)
Eos: 1 %
Hematocrit: 36.3 % (ref 34.0–46.6)
Hemoglobin: 11.8 g/dL (ref 11.1–15.9)
Immature Grans (Abs): 0 10*3/uL (ref 0.0–0.1)
Immature Granulocytes: 0 %
Lymphocytes Absolute: 1.2 10*3/uL (ref 0.7–3.1)
Lymphs: 22 %
MCH: 30.8 pg (ref 26.6–33.0)
MCHC: 32.5 g/dL (ref 31.5–35.7)
MCV: 95 fL (ref 79–97)
Monocytes Absolute: 0.7 10*3/uL (ref 0.1–0.9)
Monocytes: 13 %
Neutrophils Absolute: 3.5 10*3/uL (ref 1.4–7.0)
Neutrophils: 63 %
Platelets: 230 10*3/uL (ref 150–450)
RBC: 3.83 x10E6/uL (ref 3.77–5.28)
RDW: 13.1 % (ref 11.7–15.4)
WBC: 5.5 10*3/uL (ref 3.4–10.8)

## 2021-02-27 DIAGNOSIS — Z9689 Presence of other specified functional implants: Secondary | ICD-10-CM | POA: Diagnosis not present

## 2021-02-27 DIAGNOSIS — G20 Parkinson's disease: Secondary | ICD-10-CM | POA: Diagnosis not present

## 2021-02-27 DIAGNOSIS — Z9682 Presence of neurostimulator: Secondary | ICD-10-CM | POA: Diagnosis not present

## 2021-03-21 ENCOUNTER — Encounter: Payer: Self-pay | Admitting: Family Medicine

## 2021-03-21 ENCOUNTER — Other Ambulatory Visit: Payer: Self-pay

## 2021-03-21 ENCOUNTER — Ambulatory Visit (INDEPENDENT_AMBULATORY_CARE_PROVIDER_SITE_OTHER): Payer: Medicare PPO | Admitting: Family Medicine

## 2021-03-21 VITALS — BP 145/65 | HR 74 | Ht 62.0 in | Wt 137.0 lb

## 2021-03-21 DIAGNOSIS — E042 Nontoxic multinodular goiter: Secondary | ICD-10-CM | POA: Diagnosis not present

## 2021-03-21 DIAGNOSIS — E7800 Pure hypercholesterolemia, unspecified: Secondary | ICD-10-CM | POA: Diagnosis not present

## 2021-03-21 DIAGNOSIS — I10 Essential (primary) hypertension: Secondary | ICD-10-CM | POA: Diagnosis not present

## 2021-03-21 DIAGNOSIS — G20 Parkinson's disease: Secondary | ICD-10-CM | POA: Diagnosis not present

## 2021-03-21 DIAGNOSIS — G20A1 Parkinson's disease without dyskinesia, without mention of fluctuations: Secondary | ICD-10-CM

## 2021-03-21 NOTE — Progress Notes (Signed)
BP (!) 145/65   Pulse 74   Ht _0  (1.575 m)   Wt 137 lb (62.1 kg)   SpO2 97%   BMI 25.06 kg/m    Subjective:   Patient ID: Tracey Juarez, female    DOB: 1940/08/27, 80 y.o.   MRN: [id number]  HPI: Tracey Juarez is a 80 y.o. female presenting on 03/21/2021 for Medical Management of Chronic Issues, Hyperlipidemia, and Hypertension   HPI Hyperlipidemia Patient is coming in for recheck of his hyperlipidemia. The patient is currently taking Crestor. They deny any issues with myalgias or history of liver damage from it. They deny any focal numbness or weakness or chest pain.   Hypertension Patient is currently on lisinopril, and their blood pressure today is 145/65. Patient denies any lightheadedness or dizziness. Patient denies headaches, blurred vision, chest pains, shortness of breath, or weakness. Denies any side effects from medication and is content with current medication.   Patient had a CT scan of her cervical spine that showed 2 thyroid nodules, estimated to be 2 cm and a 2 mm.  Recommended follow-up with thyroid ultrasound.  Patient does feel like she has been losing some weight not intentionally but her appetite has been down and she is not eating as much, she denies any abdominal pains but thinks maybe it might be related to thyroid and wants it checked.  Relevant past medical, surgical, family and social history reviewed and updated as indicated. Interim medical history since our last visit reviewed. Allergies and medications reviewed and updated.  Review of Systems  Constitutional:  Negative for chills and fever.  HENT:  Negative for congestion, ear discharge and ear pain.   Eyes:  Negative for redness and visual disturbance.  Respiratory:  Negative for chest tightness and shortness of breath.   Cardiovascular:  Negative for chest pain and leg swelling.  Genitourinary:  Negative for difficulty urinating and dysuria.  Musculoskeletal:  Negative for back  pain and gait problem.  Skin:  Negative for rash.  Neurological:  Negative for light-headedness and headaches.  Psychiatric/Behavioral:  Negative for agitation and behavioral problems.   All other systems reviewed and are negative.  Per HPI unless specifically indicated above   Allergies as of 03/21/2021       Reactions   Ondansetron Swelling, Palpitations        Medication List        Accurate as of March 21, 2021 10:08 AM. If you have any questions, ask your nurse or doctor.          STOP taking these medications    diphenhydrAMINE-zinc acetate cream Commonly known as: BENADRYL Stopped by: Fransisca Kaufmann Jermey Closs, MD       TAKE these medications    acetaminophen 325 MG tablet Commonly known as: TYLENOL Take 650 mg by mouth every 6 (six) hours as needed.   anastrozole 1 MG tablet Commonly known as: ARIMIDEX Take 1 tablet (1 mg total) by mouth daily.   Carbidopa-Levodopa ER 25-100 MG tablet controlled release Commonly known as: SINEMET CR Take 1 tablet by mouth 3 (three) times daily.   cetirizine 10 MG tablet Commonly known as: ZYRTEC Take 10 mg by mouth daily.   clopidogrel 75 MG tablet Commonly known as: PLAVIX Take 75 mg by mouth daily.   famotidine 10 MG tablet Commonly known as: PEPCID Take 10 mg by mouth daily.   fluticasone 50 MCG/ACT nasal spray Commonly known as: FLONASE Place 1 spray into both nostrils 2 (  two) times daily as needed for allergies or rhinitis.   Ginger Root 550 MG Caps Take by mouth.   lisinopril 10 MG tablet Commonly known as: ZESTRIL Take 1 tablet (10 mg total) by mouth daily.   Magnesium 250 MG Tabs Take 1 tablet by mouth daily.   meclizine 12.5 MG tablet Commonly known as: ANTIVERT TAKE 1 TO 2 TABLETS BY MOUTH THREE TIMES DAILY AS NEEDED FOR DIZZINESS   multivitamin capsule Take 1 capsule by mouth daily.   oxybutynin 10 MG 24 hr tablet Commonly known as: DITROPAN-XL Take 1 tablet (10 mg total) by mouth at  bedtime.   Risedronate Sodium 35 MG Tbec Take 1 tablet (35 mg total) by mouth once a week.   rosuvastatin 10 MG tablet Commonly known as: CRESTOR Take 1 tablet (10 mg total) by mouth daily.   Vitamin D3 1.25 MG (50000 UT) Tabs Take by mouth. Taking 4,000 ui daily         Objective:   BP (!) 145/65   Pulse 74   Ht _0  (1.575 m)   Wt 137 lb (62.1 kg)   SpO2 97%   BMI 25.06 kg/m   Wt Readings from Last 3 Encounters:  03/21/21 137 lb (62.1 kg)  02/19/21 141 lb (64 kg)  02/12/21 142 lb (64.4 kg)    Physical Exam Vitals and nursing note reviewed.  Constitutional:      General: She is not in acute distress.    Appearance: She is well-developed. She is not diaphoretic.  Eyes:     Conjunctiva/sclera: Conjunctivae normal.     Pupils: Pupils are equal, round, and reactive to light.  Neck:     Thyroid: No thyroid mass (No palpable nodules), thyromegaly or thyroid tenderness.  Cardiovascular:     Rate and Rhythm: Normal rate and regular rhythm.     Heart sounds: Normal heart sounds. No murmur heard. Pulmonary:     Effort: Pulmonary effort is normal. No respiratory distress.     Breath sounds: Normal breath sounds. No wheezing.  Musculoskeletal:        General: No tenderness. Normal range of motion.     Cervical back: No tenderness.  Lymphadenopathy:     Cervical: No cervical adenopathy.  Skin:    General: Skin is warm and dry.     Findings: No rash.  Neurological:     Mental Status: She is alert and oriented to person, place, and time.     Coordination: Coordination normal.  Psychiatric:        Behavior: Behavior normal.      Assessment & Plan:   Problem List Items Addressed This Visit       Cardiovascular and Mediastinum   Hypertension - Primary   Relevant Orders   CMP14+EGFR     Nervous and Auditory   Parkinson disease ([place])     Other   Pure hypercholesterolemia   Relevant Orders   CMP14+EGFR   Lipid panel   Other Visit Diagnoses      Multiple thyroid nodules       Relevant Orders   Thyroid Panel With TSH   US THYROID       Order thyroid ultrasound and blood work, will see if that is the cause of possibly the weight loss but if not encouraged to get boost or Ensure.  Gave a list of posture exercises to help with kyphosis. Follow up plan: Return in about 6 months (around 09/21/2021), or if symptoms worsen or fail  to improve, for htn and hld.  Counseling provided for all of the vaccine components Orders Placed This Encounter  Procedures   US THYROID   CMP14+EGFR   Lipid panel   Thyroid Panel With TSH    Caryl Pina, MD [facility] 03/21/2021, 10:08 AM

## 2021-03-22 LAB — CMP14+EGFR
ALT: 5 IU/L (ref 0–32)
AST: 22 IU/L (ref 0–40)
Albumin/Globulin Ratio: 2.6 — ABNORMAL HIGH (ref 1.2–2.2)
Albumin: 4.6 g/dL (ref 3.7–4.7)
Alkaline Phosphatase: 60 IU/L (ref 44–121)
BUN/Creatinine Ratio: 15 (ref 12–28)
BUN: 11 mg/dL (ref 8–27)
Bilirubin Total: 0.5 mg/dL (ref 0.0–1.2)
CO2: 25 mmol/L (ref 20–29)
Calcium: 9.8 mg/dL (ref 8.7–10.3)
Chloride: 103 mmol/L (ref 96–106)
Creatinine, Ser: 0.71 mg/dL (ref 0.57–1.00)
Globulin, Total: 1.8 g/dL (ref 1.5–4.5)
Glucose: 91 mg/dL (ref 65–99)
Potassium: 4.3 mmol/L (ref 3.5–5.2)
Sodium: 141 mmol/L (ref 134–144)
Total Protein: 6.4 g/dL (ref 6.0–8.5)
eGFR: 87 mL/min/{1.73_m2} (ref >59)

## 2021-03-22 LAB — THYROID PANEL WITH TSH
Free Thyroxine Index: 2.5 (ref 1.2–4.9)
T3 Uptake Ratio: 31 % (ref 24–39)
T4, Total: 8.2 ug/dL (ref 4.5–12.0)
TSH: 0.818 u[IU]/mL (ref 0.450–4.500)

## 2021-03-22 LAB — LIPID PANEL
Chol/HDL Ratio: 2.7 ratio (ref 0.0–4.4)
Cholesterol, Total: 167 mg/dL (ref 100–199)
HDL: 62 mg/dL (ref >39)
LDL Chol Calc (NIH): 88 mg/dL (ref 0–99)
Triglycerides: 91 mg/dL (ref 0–149)
VLDL Cholesterol Cal: 17 mg/dL (ref 5–40)

## 2021-03-28 ENCOUNTER — Other Ambulatory Visit: Payer: Self-pay

## 2021-03-28 ENCOUNTER — Ambulatory Visit (HOSPITAL_COMMUNITY)
Admission: RE | Admit: 2021-03-28 | Discharge: 2021-03-28 | Disposition: A | Discharge status: Home / Self Care | Payer: Medicare PPO | Source: Ambulatory Visit | Attending: Family Medicine | Admitting: Family Medicine

## 2021-03-28 DIAGNOSIS — E042 Nontoxic multinodular goiter: Secondary | ICD-10-CM

## 2021-04-10 DIAGNOSIS — H40059 Ocular hypertension, unspecified eye: Secondary | ICD-10-CM | POA: Diagnosis not present

## 2021-04-16 DIAGNOSIS — H16141 Punctate keratitis, right eye: Secondary | ICD-10-CM | POA: Diagnosis not present

## 2021-05-01 ENCOUNTER — Ambulatory Visit (INDEPENDENT_AMBULATORY_CARE_PROVIDER_SITE_OTHER): Payer: Medicare PPO | Admitting: Family Medicine

## 2021-05-01 ENCOUNTER — Encounter: Payer: Self-pay | Admitting: Family Medicine

## 2021-05-01 ENCOUNTER — Other Ambulatory Visit: Payer: Self-pay

## 2021-05-01 VITALS — BP 142/62 | HR 68 | Ht 62.0 in | Wt 137.0 lb

## 2021-05-01 DIAGNOSIS — W19XXXD Unspecified fall, subsequent encounter: Secondary | ICD-10-CM | POA: Diagnosis not present

## 2021-05-01 DIAGNOSIS — R531 Weakness: Secondary | ICD-10-CM

## 2021-05-01 DIAGNOSIS — R2681 Unsteadiness on feet: Secondary | ICD-10-CM | POA: Diagnosis not present

## 2021-05-01 DIAGNOSIS — Z9181 History of falling: Secondary | ICD-10-CM | POA: Diagnosis not present

## 2021-05-01 NOTE — Progress Notes (Signed)
BP (!) 142/62   Pulse 68   Ht '5\' 2"'  (1.575 m)   Wt 137 lb (62.1 kg)   SpO2 97%   BMI 25.06 kg/m    Subjective:   Patient ID: Tracey Juarez, female    DOB: 1941-06-20, 80 y.o.   MRN: [id number]  HPI: Tracey Juarez is a 80 y.o. female presenting on 05/01/2021 for Fall (Off golf cart. Knot on left hip) and Fatigue (Gives out easy. Wonders if Hgb too low.)   HPI Patient had a fall 3 months ago back in June onto her left hip and left shoulder and she had a large hematoma and her blood counts dipped down.  She says since then her energy has been down and she has not been as active or mobile and she feels like is just not doing as well.  She still has a small knot on her left hip but she says her biggest complaint today is just the fatigue and just giving out easily.  She said her hemoglobin did dip down some after that initial hematoma and she wanted to have it checked again.  She denies any new falls but just feels weaker and less stable.  Relevant past medical, surgical, family and social history reviewed and updated as indicated. Interim medical history since our last visit reviewed. Allergies and medications reviewed and updated.  Review of Systems  Constitutional:  Negative for chills and fever.  Eyes:  Negative for visual disturbance.  Respiratory:  Negative for chest tightness and shortness of breath.   Cardiovascular:  Negative for chest pain and leg swelling.  Genitourinary:  Negative for difficulty urinating and dysuria.  Musculoskeletal:  Positive for arthralgias. Negative for back pain and gait problem.  Skin:  Negative for rash.  Neurological:  Negative for light-headedness and headaches.  Psychiatric/Behavioral:  Negative for agitation and behavioral problems.   All other systems reviewed and are negative.  Per HPI unless specifically indicated above   Allergies as of 05/01/2021       Reactions   Ondansetron Swelling, Palpitations        Medication  List        Accurate as of May 01, 2021 11:54 AM. If you have any questions, ask your nurse or doctor.          acetaminophen 325 MG tablet Commonly known as: TYLENOL Take 650 mg by mouth every 6 (six) hours as needed.   anastrozole 1 MG tablet Commonly known as: ARIMIDEX Take 1 tablet (1 mg total) by mouth daily.   Carbidopa-Levodopa ER 25-100 MG tablet controlled release Commonly known as: SINEMET CR Take 1 tablet by mouth 3 (three) times daily.   cetirizine 10 MG tablet Commonly known as: ZYRTEC Take 10 mg by mouth daily.   clopidogrel 75 MG tablet Commonly known as: PLAVIX Take 75 mg by mouth daily.   famotidine 10 MG tablet Commonly known as: PEPCID Take 10 mg by mouth daily.   fluticasone 50 MCG/ACT nasal spray Commonly known as: FLONASE Place 1 spray into both nostrils 2 (two) times daily as needed for allergies or rhinitis.   Ginger Root 550 MG Caps Take by mouth.   lisinopril 10 MG tablet Commonly known as: ZESTRIL Take 1 tablet (10 mg total) by mouth daily.   Magnesium 250 MG Tabs Take 1 tablet by mouth daily.   meclizine 12.5 MG tablet Commonly known as: ANTIVERT TAKE 1 TO 2 TABLETS BY MOUTH THREE TIMES DAILY AS NEEDED FOR  DIZZINESS   multivitamin capsule Take 1 capsule by mouth daily.   oxybutynin 10 MG 24 hr tablet Commonly known as: DITROPAN-XL Take 1 tablet (10 mg total) by mouth at bedtime.   Risedronate Sodium 35 MG Tbec Take 1 tablet (35 mg total) by mouth once a week.   rosuvastatin 10 MG tablet Commonly known as: CRESTOR Take 1 tablet (10 mg total) by mouth daily.   Vitamin D3 1.25 MG (50000 UT) Tabs Take by mouth. Taking 4,000 ui daily         Objective:   BP (!) 142/62   Pulse 68   Ht '5\' 2"'  (1.575 m)   Wt 137 lb (62.1 kg)   SpO2 97%   BMI 25.06 kg/m   Wt Readings from Last 3 Encounters:  05/01/21 137 lb (62.1 kg)  03/21/21 137 lb (62.1 kg)  02/19/21 141 lb (64 kg)    Physical Exam Vitals and nursing  note reviewed.  Constitutional:      General: She is not in acute distress.    Appearance: She is well-developed. She is not diaphoretic.  Eyes:     Conjunctiva/sclera: Conjunctivae normal.  Cardiovascular:     Rate and Rhythm: Normal rate and regular rhythm.     Heart sounds: Normal heart sounds. No murmur heard. Pulmonary:     Effort: Pulmonary effort is normal. No respiratory distress.     Breath sounds: Normal breath sounds. No wheezing.  Musculoskeletal:        General: No swelling or tenderness.  Skin:    General: Skin is warm and dry.     Findings: No rash.  Neurological:     Mental Status: She is alert and oriented to person, place, and time.     Coordination: Coordination normal.  Psychiatric:        Behavior: Behavior normal.      Assessment & Plan:   Problem List Items Addressed This Visit   None Visit Diagnoses     Fall, subsequent encounter    -  Primary   Relevant Orders   Ambulatory referral to Physical Therapy   CBC with Differential/Platelet   BMP8+EGFR   Weakness       Relevant Orders   Ambulatory referral to Physical Therapy   CBC with Differential/Platelet   BMP8+EGFR   Gait instability       Relevant Orders   Ambulatory referral to Physical Therapy   CBC with Differential/Platelet   BMP8+EGFR       Patient had a fall in June about 3 months ago but still been fighting some weakness, she is not had any further falls but she just feels like her energy is down and she is weaker.  She still feels like she has a little knot on her left hip from where she had a hematoma but it is mostly resolved.  She denies any pain there but just wanted to mention it. Follow up plan: Return if symptoms worsen or fail to improve.  Counseling provided for all of the vaccine components Orders Placed This Encounter  Procedures   CBC with Differential/Platelet   BMP8+EGFR   Ambulatory referral to Physical Therapy    Caryl Pina, MD [facility] 05/01/2021, 11:54 AM

## 2021-05-02 LAB — CBC WITH DIFFERENTIAL/PLATELET
Basophils Absolute: 0 10*3/uL (ref 0.0–0.2)
Basos: 1 %
EOS (ABSOLUTE): 0.1 10*3/uL (ref 0.0–0.4)
Eos: 1 %
Hematocrit: 41.5 % (ref 34.0–46.6)
Hemoglobin: 13.5 g/dL (ref 11.1–15.9)
Immature Grans (Abs): 0 10*3/uL (ref 0.0–0.1)
Immature Granulocytes: 0 %
Lymphocytes Absolute: 1.3 10*3/uL (ref 0.7–3.1)
Lymphs: 21 %
MCH: 30.6 pg (ref 26.6–33.0)
MCHC: 32.5 g/dL (ref 31.5–35.7)
MCV: 94 fL (ref 79–97)
Monocytes Absolute: 0.7 10*3/uL (ref 0.1–0.9)
Monocytes: 11 %
Neutrophils Absolute: 4.1 10*3/uL (ref 1.4–7.0)
Neutrophils: 66 %
Platelets: 213 10*3/uL (ref 150–450)
RBC: 4.41 x10E6/uL (ref 3.77–5.28)
RDW: 12.2 % (ref 11.7–15.4)
WBC: 6.1 10*3/uL (ref 3.4–10.8)

## 2021-05-02 LAB — BMP8+EGFR
BUN/Creatinine Ratio: 16 (ref 12–28)
BUN: 14 mg/dL (ref 8–27)
CO2: 25 mmol/L (ref 20–29)
Calcium: 9.7 mg/dL (ref 8.7–10.3)
Chloride: 104 mmol/L (ref 96–106)
Creatinine, Ser: 0.86 mg/dL (ref 0.57–1.00)
Glucose: 92 mg/dL (ref 65–99)
Potassium: 4.3 mmol/L (ref 3.5–5.2)
Sodium: 141 mmol/L (ref 134–144)
eGFR: 69 mL/min/{1.73_m2} (ref >59)

## 2021-05-13 ENCOUNTER — Other Ambulatory Visit: Payer: Self-pay

## 2021-05-13 ENCOUNTER — Ambulatory Visit: Payer: Medicare PPO | Attending: Family Medicine

## 2021-05-13 ENCOUNTER — Other Ambulatory Visit: Payer: Self-pay | Admitting: Family Medicine

## 2021-05-13 VITALS — BP 164/72

## 2021-05-13 DIAGNOSIS — R2681 Unsteadiness on feet: Secondary | ICD-10-CM | POA: Diagnosis not present

## 2021-05-13 DIAGNOSIS — N3281 Overactive bladder: Secondary | ICD-10-CM

## 2021-05-13 DIAGNOSIS — M6281 Muscle weakness (generalized): Secondary | ICD-10-CM | POA: Diagnosis not present
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
## 2021-05-15 ENCOUNTER — Other Ambulatory Visit: Payer: Self-pay

## 2021-05-15 ENCOUNTER — Ambulatory Visit: Payer: Medicare PPO

## 2021-05-15 DIAGNOSIS — R2681 Unsteadiness on feet: Secondary | ICD-10-CM

## 2021-05-15 DIAGNOSIS — M6281 Muscle weakness (generalized): Secondary | ICD-10-CM

## 2021-05-15 NOTE — Therapy (Signed)
[facility] [place], [place], [address] Phone: [phone]   Fax:  [phone]  Physical Therapy Treatment  Patient Details  Name: Tracey Juarez MRN: [id number] Date of Birth: Jan 14, 1941 Referring Provider (PT): Dettinger   Encounter Date: 05/15/2021   PT End of Session - 05/15/21 1424     Visit Number 2    Number of Visits 12    Date for PT Re-Evaluation 06/28/21    PT Start Time 1430    PT Stop Time 6270    PT Time Calculation (min) 44 min    Activity Tolerance Patient tolerated treatment well    Behavior During Therapy [facility] for tasks assessed/performed             Past Medical History:  Diagnosis Date   Atrophic vaginitis    Breast cancer in female [facility])    Breast cancer of upper-outer quadrant of left female breast ([place]) 03/20/2015   Detrusor instability    Elevated cholesterol    GERD (gastroesophageal reflux disease)    Hot flashes    Humerus head fracture, left, with routine healing, subsequent encounter 2000   Hypertension    Melanoma ([place])    Mixed basal-squamous cell carcinoma    skin   Parkinson disease ([place])    Personal history of radiation therapy    Stroke ([place])    on Plavix, no deficits    Past Surgical History:  Procedure Laterality Date   BRAIN SURGERY     BREAST LUMPECTOMY Left 04/2015   radiation   BREAST LUMPECTOMY WITH RADIOACTIVE SEED AND SENTINEL LYMPH NODE BIOPSY Left 04/25/2015   Procedure: LEFT BREAST PARTIAL MASTECTOMY WITH RADIOACTIVE SEED AND SENTINEL LYMPH NODE MAPPING;  Surgeon: Erroll Luna, MD;  Location: [place];  Service: General;  Laterality: Left;   FOOT SURGERY  2004   KNEE SURGERY  2007   skin cancer excised     TONSILLECTOMY      There were no vitals filed for this visit.   Subjective Assessment - 05/15/21 1425     Subjective Patients reports that she is tired from "running around."    Pertinent History Parkinson's Disease w/ Deep  Brain Stimulator    Limitations Standing;Walking;House hold activities    Patient Stated Goals Return to exercising 3+ times per week, improve her strength and balance    Currently in Pain? No/denies                               [facility] Adult PT Treatment/Exercise - 05/15/21 0001       Therapeutic Activites    Therapeutic Activities Work Simulation    Work Simulation unilateral carry   5 lbs, 3 laps each hand     Neuro Re-ed    Neuro Re-ed Details  slouch-overcorrect   seated, 20 reps     Exercises   Exercises Knee/Hip      Knee/Hip Exercises: Aerobic   Nustep seat 7; L4; 10 minutes      Knee/Hip Exercises: Standing   Rocker Board 3 minutes;Other (comment)   seated     Knee/Hip Exercises: Seated   Long Arc Quad Both;2 sets;10 reps    Marching Both;2 sets;10 reps                 Balance Exercises - 05/15/21 0001       Balance Exercises: Standing   Standing Eyes Opened Narrow base  of support (BOS);4 reps;30 secs;Foam/compliant surface    Sidestepping Upper extremity support;5 reps    Marching Upper extremity assist 2;Solid surface;Static;20 reps                PT Education - 05/15/21 1442     Education Details Home exercise program    Person(s) Educated Patient    Methods Demonstration;Explanation    Comprehension Verbalized understanding;Returned demonstration              PT Short Term Goals - 05/13/21 1142       PT SHORT TERM GOAL #1   Title Patient will be able to report feeling confident returning to her regularly scheduled exercise classess.    Time 3    Period Weeks    Status New    Target Date 06/03/21               PT Long Term Goals - 05/13/21 1138       PT LONG TERM GOAL #1   Title Patient will improve her 5x sit to stand time to 12 seconds or less for improved safety with transfers.    Baseline 15 seconds    Time 6    Period Weeks    Status New    Target Date 06/24/21      PT LONG TERM GOAL #2    Title Patient will be independent with her home exercise program    Time 6    Period Weeks    Status New    Target Date 06/24/21                   Plan - 05/15/21 1443     Clinical Impression Statement Patient presented to her first follow up with fair recall of her home exercise program as she required minimal cuing with these interventions. She was then provided a handout with these interventions for improved recall at home. She was then introduced to multiple new activities for improved lower extremity strength and stability needed to reduce her fall risk. She fatigued quickly with today's interventions as evidenced by her decreased step length, step height, and gait speed. She required upper extremity support or minimal therapist assistance with today's balance interventions due to unsteadiness to prevent a loss of balance. She reported feeling tired upon the conclusion of treatment. She continues to require skilled physical therapy to address her remaining impairments to safely return to her prior level of function.    Personal Factors and Comorbidities Age;Comorbidity 1;Time since onset of injury/illness/exacerbation    Comorbidities Parkinson's Disease    Examination-Activity Limitations Locomotion Level    Examination-Participation Restrictions Community Activity    Stability/Clinical Decision Making Stable/Uncomplicated    Rehab Potential Excellent    PT Frequency 2x / week    PT Duration 6 weeks    PT Treatment/Interventions ADLs/Self Care Home Management;Gait training;Stair training;Functional mobility training;Therapeutic activities;Therapeutic exercise;Balance training;Neuromuscular re-education;Patient/family education    PT Next Visit Plan focus on LE strengthening and balance activities    PT Home Exercise Plan seated marching and LAQ's    Consulted and Agree with Plan of Care Patient             Patient will benefit from skilled therapeutic intervention in  order to improve the following deficits and impairments:  Abnormal gait, Difficulty walking, Decreased balance, Decreased strength  Visit Diagnosis: Unsteadiness on feet  Muscle weakness (generalized)     Problem List Patient Active Problem List   Diagnosis Date  Noted   Osteoarthritis, multiple sites 02/03/2019   History of CVA (cerebrovascular accident) 02/03/2019   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   Senile purpura ([place]) 03/04/2017   Pure hypercholesterolemia 03/04/2017   Breast cancer of upper-outer quadrant of left female breast ([place]) 03/20/2015   Osteopenia 08/15/2013   Parkinson disease ([place])    Hypertension    Atrophic vaginitis    Detrusor instability    Melanoma ([place])    Mixed basal-squamous cell carcinoma     Darlin Coco, PT 05/15/2021, 3:23 PM  [facility] [address] [place], [place], [address] Phone: [phone]   Fax:  [phone]  Name: Wilburta Milbourn MRN: [id number] Date of Birth: 1941-06-10

## 2021-05-20 ENCOUNTER — Other Ambulatory Visit: Payer: Self-pay

## 2021-05-20 ENCOUNTER — Ambulatory Visit: Payer: Medicare PPO | Admitting: Physical Therapy

## 2021-05-20 ENCOUNTER — Encounter: Payer: Self-pay | Admitting: Physical Therapy

## 2021-05-20 DIAGNOSIS — R2681 Unsteadiness on feet: Secondary | ICD-10-CM | POA: Diagnosis not present

## 2021-05-20 DIAGNOSIS — M6281 Muscle weakness (generalized): Secondary | ICD-10-CM

## 2021-05-20 NOTE — Therapy (Signed)
[facility] [place], [place], [address] Phone: [phone]   Fax:  [phone]  Physical Therapy Treatment  Patient Details  Name: Tracey Juarez MRN: [id number] Date of Birth: [birth year] Referring Provider (PT): Dettinger   Encounter Date: 05/20/2021   PT End of Session - 05/20/21 1034     Visit Number 3    Number of Visits 12    Date for PT Re-Evaluation 06/28/21    PT Start Time 1031    PT Stop Time 1114    PT Time Calculation (min) 43 min    Activity Tolerance Patient tolerated treatment well    Behavior During Therapy [facility] for tasks assessed/performed             Past Medical History:  Diagnosis Date   Atrophic vaginitis    Breast cancer in female [facility])    Breast cancer of upper-outer quadrant of left female breast ([place]) 03/20/2015   Detrusor instability    Elevated cholesterol    GERD (gastroesophageal reflux disease)    Hot flashes    Humerus head fracture, left, with routine healing, subsequent encounter 2000   Hypertension    Melanoma ([place])    Mixed basal-squamous cell carcinoma    skin   Parkinson disease ([place])    Personal history of radiation therapy    Stroke ([place])    on Plavix, no deficits    Past Surgical History:  Procedure Laterality Date   BRAIN SURGERY     BREAST LUMPECTOMY Left 04/2015   radiation   BREAST LUMPECTOMY WITH RADIOACTIVE SEED AND SENTINEL LYMPH NODE BIOPSY Left 04/25/2015   Procedure: LEFT BREAST PARTIAL MASTECTOMY WITH RADIOACTIVE SEED AND SENTINEL LYMPH NODE MAPPING;  Surgeon: Erroll Luna, MD;  Location: [place];  Service: General;  Laterality: Left;   FOOT SURGERY  2004   KNEE SURGERY  2007   skin cancer excised     TONSILLECTOMY      There were no vitals filed for this visit.   Subjective Assessment - 05/20/21 1032     Subjective No new complaints with anything at home or new falls.    Pertinent History Parkinson's Disease w/ Deep  Brain Stimulator    Limitations Standing;Walking;House hold activities    Patient Stated Goals Return to exercising 3+ times per week, improve her strength and balance    Currently in Pain? No/denies                [facility] PT Assessment - 05/20/21 0001       Assessment   Medical Diagnosis Fall and weakness    Referring Provider (PT) Dettinger    Onset Date/Surgical Date 01/30/21    Hand Dominance Right    Next MD Visit Febuary 2023    Prior Therapy No      Precautions   Precautions Fall                           [facility] Adult PT Treatment/Exercise - 05/20/21 0001       Knee/Hip Exercises: Aerobic   Nustep L3, seat 7 x15 min      Knee/Hip Exercises: Standing   Heel Raises Both;2 sets;10 reps    Heel Raises Limitations B toe raise x20 reps    Forward Step Up Both;2 sets;10 reps;Hand Hold: 2;Step Height: 4"      Knee/Hip Exercises: Seated   Long Arc Quad Strengthening;Both;2 sets;10 reps;Weights  Long Arc Quad Weight 3 lbs.    Clamshell with [employer] Red   x20 reps   Marching Strengthening;Both;2 sets;10 reps;Limitations    Marching Limitations red theraband    Hamstring Curl Strengthening;Both;2 sets;10 reps;Limitations    Hamstring Limitations red theraband    Sit to Sand 15 reps;without UE support                 Balance Exercises - 05/20/21 0001       Balance Exercises: Standing   Standing Eyes Opened Narrow base of support (BOS);Foam/compliant surface;Time    Standing Eyes Opened Time x2 min; intermittant UE support    Tandem Stance Eyes open;Foam/compliant surface;Intermittent upper extremity support;Time    Tandem Stance Limitations x2 min    Balance Beam DLS on horizontal beam for misshapen surfaces x2 min                  PT Short Term Goals - 05/20/21 1034       PT SHORT TERM GOAL #1   Title Patient will be able to report feeling confident returning to her regularly scheduled exercise classess.    Time 3     Period Weeks    Status On-going    Target Date 06/03/21               PT Long Term Goals - 05/20/21 1035       PT LONG TERM GOAL #1   Title Patient will improve her 5x sit to stand time to 12 seconds or less for improved safety with transfers.    Baseline 15 seconds    Time 6    Period Weeks    Status On-going      PT LONG TERM GOAL #2   Title Patient will be independent with her home exercise program    Time 6    Period Weeks    Status On-going                   Plan - 05/20/21 1123     Clinical Impression Statement Patient presented in clinic with no new complaints. Patient has not returned to regular exercise classes due to fear of COVID. Patient limited with prolonged standing such as at home or while in church. Patient does have stairs in her home that she goes up and down a few times daily as her laundry and cats are downstairs. Patient instructed to stop once she sit > stands to regain equilibrium and avoid falls. Patient able to tolerate therex well with reports of minimal fatigue. Intermittant UE support utilized throughout balance training due to instability.    Personal Factors and Comorbidities Age;Comorbidity 1;Time since onset of injury/illness/exacerbation    Comorbidities Parkinson's Disease    Examination-Activity Limitations Locomotion Level    Examination-Participation Restrictions Community Activity    Stability/Clinical Decision Making Stable/Uncomplicated    Rehab Potential Excellent    PT Frequency 2x / week    PT Duration 6 weeks    PT Treatment/Interventions ADLs/Self Care Home Management;Gait training;Stair training;Functional mobility training;Therapeutic activities;Therapeutic exercise;Balance training;Neuromuscular re-education;Patient/family education    PT Next Visit Plan focus on LE strengthening and balance activities    PT Home Exercise Plan seated marching and LAQ's    Consulted and Agree with Plan of Care Patient              Patient will benefit from skilled therapeutic intervention in order to improve the following deficits and impairments:  Abnormal gait, Difficulty walking,  Decreased balance, Decreased strength  Visit Diagnosis: Unsteadiness on feet  Muscle weakness (generalized)     Problem List Patient Active Problem List   Diagnosis Date Noted   Osteoarthritis, multiple sites 02/03/2019   History of CVA (cerebrovascular accident) 02/03/2019   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   Senile purpura ([place]) 03/04/2017   Pure hypercholesterolemia 03/04/2017   Breast cancer of upper-outer quadrant of left female breast ([place]) 03/20/2015   Osteopenia 08/15/2013   Parkinson disease ([place])    Hypertension    Atrophic vaginitis    Detrusor instability    Melanoma ([place])    Mixed basal-squamous cell carcinoma     Standley Brooking, PTA 05/20/2021, 11:26 AM  [facility] [address] [place], [place], [address] Phone: [phone]   Fax:  [phone]  Name: Tracey Juarez MRN: [id number] Date of Birth: 10-27-1940

## 2021-05-22 ENCOUNTER — Other Ambulatory Visit: Payer: Self-pay

## 2021-05-22 ENCOUNTER — Ambulatory Visit: Payer: Medicare PPO | Admitting: Physical Therapy

## 2021-05-22 DIAGNOSIS — R2681 Unsteadiness on feet: Secondary | ICD-10-CM

## 2021-05-22 DIAGNOSIS — M6281 Muscle weakness (generalized): Secondary | ICD-10-CM

## 2021-05-22 NOTE — Therapy (Signed)
[facility] [place], [place], [address] Phone: [phone]   Fax:  [phone]  Physical Therapy Treatment  Patient Details  Name: Tracey Juarez MRN: [id number] Date of Birth: 06-17-1941 Referring Provider (PT): Dettinger   Encounter Date: 05/22/2021   PT End of Session - 05/22/21 1026     Visit Number 4    Number of Visits 12    Date for PT Re-Evaluation 06/28/21    PT Start Time 0945    PT Stop Time 1019    PT Time Calculation (min) 34 min    Activity Tolerance Patient tolerated treatment well    Behavior During Therapy [facility] for tasks assessed/performed             Past Medical History:  Diagnosis Date   Atrophic vaginitis    Breast cancer in female [facility])    Breast cancer of upper-outer quadrant of left female breast ([place]) 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   Detrusor instability    Elevated cholesterol    GERD (gastroesophageal reflux disease)    Hot flashes    Humerus head fracture, left, with routine healing, subsequent encounter 2000   Hypertension    Melanoma ([place])    Mixed basal-squamous cell carcinoma    skin   Parkinson disease ([place])    Personal history of radiation therapy    Stroke ([place])    on Plavix, no deficits    Past Surgical History:  Procedure Laterality Date   BRAIN SURGERY     BREAST LUMPECTOMY Left 04/2015   radiation   BREAST LUMPECTOMY WITH RADIOACTIVE SEED AND SENTINEL LYMPH NODE BIOPSY Left 04/25/2015   Procedure: LEFT BREAST PARTIAL MASTECTOMY WITH RADIOACTIVE SEED AND SENTINEL [place];  Surgeon: Erroll Luna, MD;  Location: [facility];  Service: General;  Laterality: Left;   FOOT SURGERY  2004   KNEE SURGERY  2007   skin cancer excised     TONSILLECTOMY      There were no vitals filed for this visit.   Subjective Assessment - 05/22/21 1027     Subjective COVID-19 screen performed prior to patient entering clinic.  No new complaints.    Pertinent History  Parkinson's Disease w/ Deep Brain Stimulator    Limitations Standing;Walking;House hold activities    Patient Stated Goals Return to exercising 3+ times per week, improve her strength and balance                               [facility] Adult PT Treatment/Exercise - 05/22/21 0001       Exercises   Exercises Knee/Hip      Knee/Hip Exercises: Aerobic   Nustep Level 3 x 18 minutes.                 Balance Exercises - 05/22/21 0001       Balance Exercises: Standing   Other Standing Exercises In parallal bars:   Rockerboard x 3 minutes with minimal hand usage as needed for balance checks f/b Airex Balanace pad x 4 minutes with multi-directional manual balanace challenges provided.    Other Standing Exercises Comments $-way walking against resistance of blue XTS band 1 minutes each direction....4 minutes total).                  PT Short Term Goals - 05/20/21 1034       PT SHORT TERM GOAL #1   Title Patient will be  able to report feeling confident returning to her regularly scheduled exercise classess.    Time 3    Period Weeks    Status On-going    Target Date 06/03/21               PT Long Term Goals - 05/20/21 1035       PT LONG TERM GOAL #1   Title Patient will improve her 5x sit to stand time to 12 seconds or less for improved safety with transfers.    Baseline 15 seconds    Time 6    Period Weeks    Status On-going      PT LONG TERM GOAL #2   Title Patient will be independent with her home exercise program    Time 6    Period Weeks    Status On-going                   Plan - 05/22/21 1030     Clinical Impression Statement The patient did an excellent job today with balance activities.  She demonstrated very good strategies to avoid loss of balance.    Personal Factors and Comorbidities Age;Comorbidity 1;Time since onset of injury/illness/exacerbation    Comorbidities Parkinson's Disease    Examination-Activity  Limitations Locomotion Level    Examination-Participation Restrictions Community Activity    Stability/Clinical Decision Making Stable/Uncomplicated    Rehab Potential Excellent    PT Frequency 2x / week    PT Duration 6 weeks    PT Treatment/Interventions ADLs/Self Care Home Management;Gait training;Stair training;Functional mobility training;Therapeutic activities;Therapeutic exercise;Balance training;Neuromuscular re-education;Patient/family education    PT Next Visit Plan focus on LE strengthening and balance activities    PT Home Exercise Plan seated marching and LAQ's    Consulted and Agree with Plan of Care Patient             Patient will benefit from skilled therapeutic intervention in order to improve the following deficits and impairments:  Abnormal gait, Difficulty walking, Decreased balance, Decreased strength  Visit Diagnosis: Unsteadiness on feet  Muscle weakness (generalized)     Problem List Patient Active Problem List   Diagnosis Date Noted   Osteoarthritis, multiple sites 02/03/2019   History of CVA (cerebrovascular accident) 02/03/2019   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 [place], [place], [address] Phone: [phone]   Fax:  [phone]  Name: Tracey Juarez MRN: [id number] Date of Birth: 03-10-1941

## 2021-05-27 ENCOUNTER — Other Ambulatory Visit: Payer: Self-pay

## 2021-05-27 ENCOUNTER — Ambulatory Visit: Payer: Medicare PPO | Admitting: Physical Therapy

## 2021-05-27 ENCOUNTER — Encounter: Payer: Self-pay | Admitting: Physical Therapy

## 2021-05-27 DIAGNOSIS — M6281 Muscle weakness (generalized): Secondary | ICD-10-CM | POA: Diagnosis not present

## 2021-05-27 DIAGNOSIS — R2681 Unsteadiness on feet: Secondary | ICD-10-CM

## 2021-05-27 NOTE — Therapy (Signed)
[facility] [place], [place], [address] Phone: [phone]   Fax:  [phone]  Physical Therapy Treatment  Patient Details  Name: Tracey Juarez MRN: [id number] Date of Birth: 01/14/1941 Referring Provider (PT): Dettinger   Encounter Date: 05/27/2021   PT End of Session - 05/27/21 1004     Visit Number 5    Number of Visits 12    Date for PT Re-Evaluation 06/28/21    PT Start Time 0947    PT Stop Time 1030    PT Time Calculation (min) 43 min    Activity Tolerance Patient tolerated treatment well    Behavior During Therapy [facility] for tasks assessed/performed             Past Medical History:  Diagnosis Date   Atrophic vaginitis    Breast cancer in female [facility])    Breast cancer of upper-outer quadrant of left female breast ([place]) 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   Detrusor instability    Elevated cholesterol    GERD (gastroesophageal reflux disease)    Hot flashes    Humerus head fracture, left, with routine healing, subsequent encounter 2000   Hypertension    Melanoma ([place])    Mixed basal-squamous cell carcinoma    skin   Parkinson disease ([place])    Personal history of radiation therapy    Stroke ([place])    on Plavix, no deficits    Past Surgical History:  Procedure Laterality Date   BRAIN SURGERY     BREAST LUMPECTOMY Left 04/2015   radiation   BREAST LUMPECTOMY WITH RADIOACTIVE SEED AND SENTINEL LYMPH NODE BIOPSY Left 04/25/2015   Procedure: LEFT BREAST PARTIAL MASTECTOMY WITH RADIOACTIVE SEED AND SENTINEL [place];  Surgeon: Erroll Luna, MD;  Location: [facility];  Service: General;  Laterality: Left;   FOOT SURGERY  2004   KNEE SURGERY  2007   skin cancer excised     TONSILLECTOMY      There were no vitals filed for this visit.   Subjective Assessment - 05/27/21 1004     Subjective COVID-19 screen performed prior to patient entering clinic.  No new complaints.    Pertinent History  Parkinson's Disease w/ Deep Brain Stimulator    Limitations Standing;Walking;House hold activities    Patient Stated Goals Return to exercising 3+ times per week, improve her strength and balance    Currently in Pain? No/denies                [facility] PT Assessment - 05/27/21 0001       Assessment   Medical Diagnosis Fall and weakness    Referring Provider (PT) Dettinger    Onset Date/Surgical Date 01/30/21    Hand Dominance Right    Next MD Visit Febuary 2023    Prior Therapy No      Precautions   Precautions Fall                           [facility] Adult PT Treatment/Exercise - 05/27/21 0001       Knee/Hip Exercises: Aerobic   Nustep L3 x15 min      Knee/Hip Exercises: Seated   Long Arc Quad Strengthening;Both;3 sets;10 reps;Weights    Long Arc Quad Weight 3 lbs.    Marching Strengthening;Both;2 sets;10 reps;Weights    Marching Limitations 3    Hamstring Curl Strengthening;Both;2 sets;10 reps;Limitations    Hamstring Limitations green theraband    Sit to  Sand 20 reps;without UE support   education for foot placement, hinge at hips                Balance Exercises - 05/27/21 0001       Balance Exercises: Standing   Standing Eyes Opened Narrow base of support (BOS);Foam/compliant surface;Cognitive challenge;Time   head turns/conversation   Standing Eyes Opened Time 3 min    Tandem Stance Eyes open;Upper extremity support 1;Foam/compliant surface;Cognitive challenge   challenged with head turns/conversation; 3 min   Rockerboard Anterior/posterior;EO;Other time (comment);Intermittent UE support   x3 min                 PT Short Term Goals - 05/20/21 1034       PT SHORT TERM GOAL #1   Title Patient will be able to report feeling confident returning to her regularly scheduled exercise classess.    Time 3    Period Weeks    Status On-going    Target Date 06/03/21               PT Long Term Goals - 05/20/21 1035       PT  LONG TERM GOAL #1   Title Patient will improve her 5x sit to stand time to 12 seconds or less for improved safety with transfers.    Baseline 15 seconds    Time 6    Period Weeks    Status On-going      PT LONG TERM GOAL #2   Title Patient will be independent with her home exercise program    Time 6    Period Weeks    Status On-going                   Plan - 05/27/21 1039     Clinical Impression Statement Patient presented in clinic with reports of no new falls. Some increased strength noted by patient in LEs. Patient able to tolerate therex well with instruction in proper sit <> stand technique to improve ease of transfer. Less UE support instructed today for uneven surface training as that is the most difficult per patient report. Challenges provided during balance training as head turns with added conversation.    Personal Factors and Comorbidities Age;Comorbidity 1;Time since onset of injury/illness/exacerbation    Comorbidities Parkinson's Disease    Examination-Activity Limitations Locomotion Level    Examination-Participation Restrictions Community Activity    Stability/Clinical Decision Making Stable/Uncomplicated    Rehab Potential Excellent    PT Frequency 2x / week    PT Duration 6 weeks    PT Treatment/Interventions ADLs/Self Care Home Management;Gait training;Stair training;Functional mobility training;Therapeutic activities;Therapeutic exercise;Balance training;Neuromuscular re-education;Patient/family education    PT Next Visit Plan focus on LE strengthening and balance activities    PT Home Exercise Plan seated marching and LAQ's    Consulted and Agree with Plan of Care Patient             Patient will benefit from skilled therapeutic intervention in order to improve the following deficits and impairments:  Abnormal gait, Difficulty walking, Decreased balance, Decreased strength  Visit Diagnosis: Unsteadiness on feet  Muscle weakness  (generalized)     Problem List Patient Active Problem List   Diagnosis Date Noted   Osteoarthritis, multiple sites 02/03/2019   History of CVA (cerebrovascular accident) 02/03/2019   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   Senile purpura ([place]) 03/04/2017   Pure hypercholesterolemia 03/04/2017   Breast cancer  of upper-outer quadrant of left female breast ([place]) 03/20/2015   Osteopenia 08/15/2013   Parkinson disease ([place])    Hypertension    Atrophic vaginitis    Detrusor instability    Melanoma ([place])    Mixed basal-squamous cell carcinoma     Standley Brooking, PTA 05/27/2021, 10:44 AM  [facility] [address] [place], [place], [address] Phone: [phone]   Fax:  [phone]  Name: Tracey Juarez MRN: [id number] Date of Birth: Jan 04, 1941

## 2021-05-29 ENCOUNTER — Ambulatory Visit: Payer: Medicare PPO | Admitting: Physical Therapy

## 2021-05-29 ENCOUNTER — Other Ambulatory Visit: Payer: Self-pay

## 2021-05-29 ENCOUNTER — Encounter: Payer: Self-pay | Admitting: Physical Therapy

## 2021-05-29 DIAGNOSIS — M6281 Muscle weakness (generalized): Secondary | ICD-10-CM | POA: Diagnosis not present

## 2021-05-29 DIAGNOSIS — R2681 Unsteadiness on feet: Secondary | ICD-10-CM

## 2021-05-29 NOTE — Therapy (Signed)
[facility] [place], [place], [address] Phone: [phone]   Fax:  [phone]  Physical Therapy Treatment  Patient Details  Name: Tracey Juarez MRN: [id number] Date of Birth: Jan 26, 1941 Referring Provider (PT): Dettinger   Encounter Date: 05/29/2021   PT End of Session - 05/29/21 0952     Visit Number 6    Number of Visits 12    Date for PT Re-Evaluation 06/28/21    PT Start Time 0947    PT Stop Time 1030    PT Time Calculation (min) 43 min    Activity Tolerance Patient tolerated treatment well    Behavior During Therapy [facility] for tasks assessed/performed             Past Medical History:  Diagnosis Date   Atrophic vaginitis    Breast cancer in female [facility])    Breast cancer of upper-outer quadrant of left female breast ([place]) 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   Detrusor instability    Elevated cholesterol    GERD (gastroesophageal reflux disease)    Hot flashes    Humerus head fracture, left, with routine healing, subsequent encounter 2000   Hypertension    Melanoma ([place])    Mixed basal-squamous cell carcinoma    skin   Parkinson disease ([place])    Personal history of radiation therapy    Stroke ([place])    on Plavix, no deficits    Past Surgical History:  Procedure Laterality Date   BRAIN SURGERY     BREAST LUMPECTOMY Left 04/2015   radiation   BREAST LUMPECTOMY WITH RADIOACTIVE SEED AND SENTINEL LYMPH NODE BIOPSY Left 04/25/2015   Procedure: LEFT BREAST PARTIAL MASTECTOMY WITH RADIOACTIVE SEED AND SENTINEL LYMPH NODE MAPPING;  Surgeon: Erroll Luna, MD;  Location: [facility];  Service: General;  Laterality: Left;   FOOT SURGERY  2004   KNEE SURGERY  2007   skin cancer excised     TONSILLECTOMY      There were no vitals filed for this visit.   Subjective Assessment - 05/29/21 0952     Subjective COVID-19 screen performed prior to patient entering clinic.  No new complaints.    Pertinent History  Parkinson's Disease w/ Deep Brain Stimulator    Limitations Standing;Walking;House hold activities    Patient Stated Goals Return to exercising 3+ times per week, improve her strength and balance    Currently in Pain? No/denies                [facility] PT Assessment - 05/29/21 0001       Assessment   Medical Diagnosis Fall and weakness    Referring Provider (PT) Dettinger    Onset Date/Surgical Date 01/30/21    Hand Dominance Right    Next MD Visit Febuary 2023    Prior Therapy No      Precautions   Precautions Fall                           [facility] Adult PT Treatment/Exercise - 05/29/21 0001       Knee/Hip Exercises: Aerobic   Nustep L3 x19 min      Knee/Hip Exercises: Seated   Long Arc Quad Strengthening;Both;3 sets;10 reps;Weights    Long Arc Quad Weight 4 lbs.    Marching Strengthening;Both;2 sets;10 reps;Weights    Marching Limitations green theraband    Hamstring Curl Strengthening;Both;2 sets;10 reps;Limitations    Hamstring Limitations green theraband    Sit  to [facility] 20 reps;without UE support   with green theraband                Balance Exercises - 05/29/21 0001       Balance Exercises: Standing   SLS Eyes open;Solid surface;Intermittent upper extremity support;Other (comment)   3 sec SLS holds while alternating   Sidestepping 5 reps;Limitations    Sidestepping Limitations over red theraband on the floor    Other Standing Exercises B reachouts 2# x10 reps    Other Standing Exercises Comments B BIG lunge with OH reach x10 reps                  PT Short Term Goals - 05/20/21 1034       PT SHORT TERM GOAL #1   Title Patient will be able to report feeling confident returning to her regularly scheduled exercise classess.    Time 3    Period Weeks    Status On-going    Target Date 06/03/21               PT Long Term Goals - 05/20/21 1035       PT LONG TERM GOAL #1   Title Patient will improve her 5x sit to stand  time to 12 seconds or less for improved safety with transfers.    Baseline 15 seconds    Time 6    Period Weeks    Status On-going      PT LONG TERM GOAL #2   Title Patient will be independent with her home exercise program    Time 6    Period Weeks    Status On-going                   Plan - 05/29/21 1324     Clinical Impression Statement Patient notes improvement since PT but was able to go dancing last night. Patient goes dancing locally and two steps and flat foots. Patient reports feeling safe and comfortable while dancing but limited due to R great hammer toe. Patient able to complete therex well but somewhat limited in standing balance stepping activities due to R great hammer toe.    Personal Factors and Comorbidities Age;Comorbidity 1;Time since onset of injury/illness/exacerbation    Comorbidities Parkinson's Disease    Examination-Activity Limitations Locomotion Level    Examination-Participation Restrictions Community Activity    Stability/Clinical Decision Making Stable/Uncomplicated    Rehab Potential Excellent    PT Frequency 2x / week    PT Duration 6 weeks    PT Treatment/Interventions ADLs/Self Care Home Management;Gait training;Stair training;Functional mobility training;Therapeutic activities;Therapeutic exercise;Balance training;Neuromuscular re-education;Patient/family education    PT Next Visit Plan focus on LE strengthening and balance activities    PT Home Exercise Plan seated marching and LAQ's    Consulted and Agree with Plan of Care Patient             Patient will benefit from skilled therapeutic intervention in order to improve the following deficits and impairments:  Abnormal gait, Difficulty walking, Decreased balance, Decreased strength  Visit Diagnosis: Unsteadiness on feet  Muscle weakness (generalized)     Problem List Patient Active Problem List   Diagnosis Date Noted   Osteoarthritis, multiple sites 02/03/2019   History  of CVA (cerebrovascular accident) 02/03/2019   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   Senile purpura ([place]) 03/04/2017   Pure hypercholesterolemia 03/04/2017   Breast cancer of upper-outer quadrant of left  female breast ([place]) 03/20/2015   Osteopenia 08/15/2013   Parkinson disease ([place])    Hypertension    Atrophic vaginitis    Detrusor instability    Melanoma ([place])    Mixed basal-squamous cell carcinoma     Standley Brooking, PTA 05/29/2021, 1:27 PM  [facility] [address] [place], [place], [address] Phone: [phone]   Fax:  [phone]  Name: Jaynie Hitch MRN: [id number] Date of Birth: 1940/09/29

## 2021-06-03 ENCOUNTER — Ambulatory Visit: Payer: Medicare PPO

## 2021-06-03 ENCOUNTER — Other Ambulatory Visit: Payer: Self-pay

## 2021-06-03 DIAGNOSIS — M6281 Muscle weakness (generalized): Secondary | ICD-10-CM | POA: Diagnosis not present

## 2021-06-03 DIAGNOSIS — R2681 Unsteadiness on feet: Secondary | ICD-10-CM | POA: Diagnosis not present

## 2021-06-03 NOTE — Therapy (Signed)
[facility] [place], [place], [address] Phone: [phone]   Fax:  [phone]  Physical Therapy Treatment  Patient Details  Name: Tracey Juarez MRN: [id number] Date of Birth: 1940/12/05 Referring Provider (PT): Dettinger   Encounter Date: 06/03/2021   PT End of Session - 06/03/21 0937     Visit Number 7    Number of Visits 12    Date for PT Re-Evaluation 06/28/21    PT Start Time 0946    PT Stop Time 1030    PT Time Calculation (min) 44 min    Activity Tolerance Patient tolerated treatment well    Behavior During Therapy [facility] for tasks assessed/performed             Past Medical History:  Diagnosis Date   Atrophic vaginitis    Breast cancer in female [facility])    Breast cancer of upper-outer quadrant of left female breast ([place]) 03/20/2015   Detrusor instability    Elevated cholesterol    GERD (gastroesophageal reflux disease)    Hot flashes    Humerus head fracture, left, with routine healing, subsequent encounter 2000   Hypertension    Melanoma ([place])    Mixed basal-squamous cell carcinoma    skin   Parkinson disease ([place])    Personal history of radiation therapy    Stroke ([place])    on Plavix, no deficits    Past Surgical History:  Procedure Laterality Date   BRAIN SURGERY     BREAST LUMPECTOMY Left 04/2015   radiation   BREAST LUMPECTOMY WITH RADIOACTIVE SEED AND SENTINEL LYMPH NODE BIOPSY Left 04/25/2015   Procedure: LEFT BREAST PARTIAL MASTECTOMY WITH RADIOACTIVE SEED AND SENTINEL LYMPH NODE MAPPING;  Surgeon: Erroll Luna, MD;  Location: [place];  Service: General;  Laterality: Left;   FOOT SURGERY  2004   KNEE SURGERY  2007   skin cancer excised     TONSILLECTOMY      There were no vitals filed for this visit.   Subjective Assessment - 06/03/21 0936     Subjective COVID-19 screen performed prior to patient entering clinic. Patient reports that she was sore after her  last appointment, but she feels good today.    Pertinent History Parkinson's Disease w/ Deep Brain Stimulator    Limitations Standing;Walking;House hold activities    Patient Stated Goals Return to exercising 3+ times per week, improve her strength and balance    Currently in Pain? No/denies                               [facility] Adult PT Treatment/Exercise - 06/03/21 0001       Knee/Hip Exercises: Aerobic   Nustep L4 x 15 mins      Knee/Hip Exercises: Standing   Lateral Step Up Both;Hand Hold: 2;Step Height: 4"   2 mins   Forward Step Up Both;Step Height: 4";Hand Hold: 2   2 mins   Other Standing Knee Exercises Ball up the wall   solid surface; reaching outside BOS     Knee/Hip Exercises: Seated   Long Arc Quad Strengthening;Both;Weights   2 min each   Long Arc Quad Weight 4 lbs.    Other Seated Knee/Hip Exercises Heel/toe raises   3 mins; BLE                Balance Exercises - 06/03/21 0001       Balance Exercises:  Standing   Sidestepping Upper extremity support   2 mins   Marching Upper extremity assist 2;Foam/compliant surface;Static   2 mins                 PT Short Term Goals - 05/20/21 1034       PT SHORT TERM GOAL #1   Title Patient will be able to report feeling confident returning to her regularly scheduled exercise classess.    Time 3    Period Weeks    Status On-going    Target Date 06/03/21               PT Long Term Goals - 05/20/21 1035       PT LONG TERM GOAL #1   Title Patient will improve her 5x sit to stand time to 12 seconds or less for improved safety with transfers.    Baseline 15 seconds    Time 6    Period Weeks    Status On-going      PT LONG TERM GOAL #2   Title Patient will be independent with her home exercise program    Time 6    Period Weeks    Status On-going                   Plan - 06/03/21 0954     Clinical Impression Statement Treatment focused on familiar interventions  for imporved lower extremity strength and stability. She required moderate cuing with today's standing interventions for improved step height and step length to facilitate improved safety and gait mechanics. She was able to demonstrate a slight improvement in her gait mechanics upon the conclusion of today's treatment. She reported feeling good upon the conclusion of treatment. She continues to require skilled physical therapy to address her remaining impairments to return to her prior level of function.    Personal Factors and Comorbidities Age;Comorbidity 1;Time since onset of injury/illness/exacerbation    Comorbidities Parkinson's Disease    Examination-Activity Limitations Locomotion Level    Examination-Participation Restrictions Community Activity    Stability/Clinical Decision Making Stable/Uncomplicated    Rehab Potential Excellent    PT Frequency 2x / week    PT Duration 6 weeks    PT Treatment/Interventions ADLs/Self Care Home Management;Gait training;Stair training;Functional mobility training;Therapeutic activities;Therapeutic exercise;Balance training;Neuromuscular re-education;Patient/family education    PT Next Visit Plan focus on LE strengthening and balance activities    PT Home Exercise Plan seated marching and LAQ's    Consulted and Agree with Plan of Care Patient             Patient will benefit from skilled therapeutic intervention in order to improve the following deficits and impairments:  Abnormal gait, Difficulty walking, Decreased balance, Decreased strength  Visit Diagnosis: Unsteadiness on feet  Muscle weakness (generalized)     Problem List Patient Active Problem List   Diagnosis Date Noted   Osteoarthritis, multiple sites 02/03/2019   History of CVA (cerebrovascular accident) 02/03/2019   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   Senile purpura ([place]) 03/04/2017   Pure hypercholesterolemia 03/04/2017    Breast cancer of upper-outer quadrant of left female breast ([place]) 03/20/2015   Osteopenia 08/15/2013   Parkinson disease ([place])    Hypertension    Atrophic vaginitis    Detrusor instability    Melanoma ([place])    Mixed basal-squamous cell carcinoma     Darlin Coco, PT 06/03/2021, 11:47 AM  [facility]  [place], [place], [address] Phone: [phone]   Fax:  [phone]  Name: Tracey Juarez MRN: [id number] Date of Birth: 07/24/1941

## 2021-06-05 ENCOUNTER — Ambulatory Visit: Payer: Medicare PPO

## 2021-06-05 ENCOUNTER — Other Ambulatory Visit: Payer: Self-pay

## 2021-06-05 DIAGNOSIS — M6281 Muscle weakness (generalized): Secondary | ICD-10-CM

## 2021-06-05 DIAGNOSIS — R2681 Unsteadiness on feet: Secondary | ICD-10-CM

## 2021-06-05 NOTE — Therapy (Signed)
[facility] [place], [place], [address] Phone: [phone]   Fax:  [phone]  Physical Therapy Treatment  Patient Details  Name: Tracey Juarez MRN: [id number] Date of Birth: 1941-05-30 Referring Provider (PT): Dettinger   Encounter Date: 06/05/2021   PT End of Session - 06/05/21 0937     Visit Number 8    Number of Visits 12    Date for PT Re-Evaluation 06/28/21    PT Start Time 0945    PT Stop Time 1027    PT Time Calculation (min) 42 min    Activity Tolerance Patient tolerated treatment well    Behavior During Therapy [facility] for tasks assessed/performed             Past Medical History:  Diagnosis Date   Atrophic vaginitis    Breast cancer in female [facility])    Breast cancer of upper-outer quadrant of left female breast ([place]) 03/20/2015   Detrusor instability    Elevated cholesterol    GERD (gastroesophageal reflux disease)    Hot flashes    Humerus head fracture, left, with routine healing, subsequent encounter 2000   Hypertension    Melanoma ([place])    Mixed basal-squamous cell carcinoma    skin   Parkinson disease ([place])    Personal history of radiation therapy    Stroke ([place])    on Plavix, no deficits    Past Surgical History:  Procedure Laterality Date   BRAIN SURGERY     BREAST LUMPECTOMY Left 04/2015   radiation   BREAST LUMPECTOMY WITH RADIOACTIVE SEED AND SENTINEL LYMPH NODE BIOPSY Left 04/25/2015   Procedure: LEFT BREAST PARTIAL MASTECTOMY WITH RADIOACTIVE SEED AND SENTINEL LYMPH NODE MAPPING;  Surgeon: Erroll Luna, MD;  Location: [place];  Service: General;  Laterality: Left;   FOOT SURGERY  2004   KNEE SURGERY  2007   skin cancer excised     TONSILLECTOMY      There were no vitals filed for this visit.   Subjective Assessment - 06/05/21 0936     Subjective COVID-19 screen performed prior to patient entering clinic. She reports that she feels good today.     Pertinent History Parkinson's Disease w/ Deep Brain Stimulator    Limitations Standing;Walking;House hold activities    Patient Stated Goals Return to exercising 3+ times per week, improve her strength and balance    Currently in Pain? No/denies                               [facility] Adult PT Treatment/Exercise - 06/05/21 0001       Exercises   Exercises Lumbar      Lumbar Exercises: Standing   Shoulder Extension Both;Strengthening;15 reps   2 sets; green XTS   Shoulder Extension Limitations Required cuing for upright stance      Lumbar Exercises: Seated   Sit to Stand 15 reps   from lowered mat table     Knee/Hip Exercises: Aerobic   Nustep L5 x 15 minutes      Knee/Hip Exercises: Standing   Forward Step Up Both;Step Height: 6";Hand Hold: 2   2 minutes     Knee/Hip Exercises: Seated   Long Arc Quad Strengthening;Both;Weights   2 minutes each   Long Arc Quad Weight 4 lbs.                 Balance Exercises - 06/05/21 0001  Balance Exercises: Standing   Sidestepping Upper extremity support   over cones; 1 minute   Sidestepping Limitations limited by foot pain    Marching Upper extremity assist 2;Foam/compliant surface;Static    Heel Raises --   2 minutes                 PT Short Term Goals - 05/20/21 1034       PT SHORT TERM GOAL #1   Title Patient will be able to report feeling confident returning to her regularly scheduled exercise classess.    Time 3    Period Weeks    Status On-going    Target Date 06/03/21               PT Long Term Goals - 05/20/21 1035       PT LONG TERM GOAL #1   Title Patient will improve her 5x sit to stand time to 12 seconds or less for improved safety with transfers.    Baseline 15 seconds    Time 6    Period Weeks    Status On-going      PT LONG TERM GOAL #2   Title Patient will be independent with her home exercise program    Time 6    Period Weeks    Status On-going                    Plan - 06/05/21 5284     Clinical Impression Statement Patient was progressed with sit to stands in addition to familiar interventions for improved function with daily activities. She required minimal cuing with resisted shoulder extension for upright stance to promote lumbar stability needed to safely perform functional activities. Her foot pain was her primary limiting factor with today's interventions with side stepping over cones being the most aggravating. Seated interventions such as long arc quads were utilized as seated activities eased her foot pain. She reported feeling good upon the conclusion of treatment. She continues to require skilled physical therapy to address her remaining impairments to safely return to her prior level of function.    Personal Factors and Comorbidities Age;Comorbidity 1;Time since onset of injury/illness/exacerbation    Comorbidities Parkinson's Disease    Examination-Activity Limitations Locomotion Level    Examination-Participation Restrictions Community Activity    Stability/Clinical Decision Making Stable/Uncomplicated    Rehab Potential Excellent    PT Frequency 2x / week    PT Duration 6 weeks    PT Treatment/Interventions ADLs/Self Care Home Management;Gait training;Stair training;Functional mobility training;Therapeutic activities;Therapeutic exercise;Balance training;Neuromuscular re-education;Patient/family education    PT Next Visit Plan focus on LE strengthening and balance activities    PT Home Exercise Plan seated marching and LAQ's    Consulted and Agree with Plan of Care Patient             Patient will benefit from skilled therapeutic intervention in order to improve the following deficits and impairments:  Abnormal gait, Difficulty walking, Decreased balance, Decreased strength  Visit Diagnosis: Unsteadiness on feet  Muscle weakness (generalized)     Problem List Patient Active Problem List   Diagnosis Date  Noted   Osteoarthritis, multiple sites 02/03/2019   History of CVA (cerebrovascular accident) 02/03/2019   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   Senile purpura ([place]) 03/04/2017   Pure hypercholesterolemia 03/04/2017   Breast cancer of upper-outer quadrant of left female breast ([place]) 03/20/2015   Osteopenia 08/15/2013   Parkinson disease ([place])  Hypertension    Atrophic vaginitis    Detrusor instability    Melanoma ([place])    Mixed basal-squamous cell carcinoma     Darlin Coco, PT 06/05/2021, 10:35 AM  [facility] [address] [place], [place], [address] Phone: [phone]   Fax:  [phone]  Name: Tracey Juarez MRN: [id number] Date of Birth: 1941-05-07

## 2021-06-10 ENCOUNTER — Ambulatory Visit: Payer: Medicare PPO

## 2021-06-10 ENCOUNTER — Other Ambulatory Visit: Payer: Self-pay

## 2021-06-10 DIAGNOSIS — M6281 Muscle weakness (generalized): Secondary | ICD-10-CM | POA: Diagnosis not present

## 2021-06-10 DIAGNOSIS — R2681 Unsteadiness on feet: Secondary | ICD-10-CM | POA: Diagnosis not present

## 2021-06-10 NOTE — Therapy (Signed)
[facility] [place], [place], [address] Phone: [phone]   Fax:  [phone]  Physical Therapy Treatment  Patient Details  Name: Tracey Juarez MRN: [id number] Date of Birth: May 27, 1941 Referring Provider (PT): Dettinger   Encounter Date: 06/10/2021   PT End of Session - 06/10/21 0952     Visit Number 9    Number of Visits 12    Date for PT Re-Evaluation 06/28/21    PT Start Time 0947    PT Stop Time 1030    PT Time Calculation (min) 43 min    Activity Tolerance Patient tolerated treatment well    Behavior During Therapy [facility] for tasks assessed/performed             Past Medical History:  Diagnosis Date   Atrophic vaginitis    Breast cancer in female [facility])    Breast cancer of upper-outer quadrant of left female breast ([place]) 03/20/2015   Detrusor instability    Elevated cholesterol    GERD (gastroesophageal reflux disease)    Hot flashes    Humerus head fracture, left, with routine healing, subsequent encounter 2000   Hypertension    Melanoma ([place])    Mixed basal-squamous cell carcinoma    skin   Parkinson disease ([place])    Personal history of radiation therapy    Stroke ([place])    on Plavix, no deficits    Past Surgical History:  Procedure Laterality Date   BRAIN SURGERY     BREAST LUMPECTOMY Left 04/2015   radiation   BREAST LUMPECTOMY WITH RADIOACTIVE SEED AND SENTINEL LYMPH NODE BIOPSY Left 04/25/2015   Procedure: LEFT BREAST PARTIAL MASTECTOMY WITH RADIOACTIVE SEED AND SENTINEL LYMPH NODE MAPPING;  Surgeon: Erroll Luna, MD;  Location: [place];  Service: General;  Laterality: Left;   FOOT SURGERY  2004   KNEE SURGERY  2007   skin cancer excised     TONSILLECTOMY      There were no vitals filed for this visit.   Subjective Assessment - 06/10/21 0951     Subjective COVID-19 screen performed prior to patient entering clinic. She denies any pain in particular today.     Pertinent History Parkinson's Disease w/ Deep Brain Stimulator    Limitations Standing;Walking;House hold activities    Patient Stated Goals Return to exercising 3+ times per week, improve her strength and balance    Currently in Pain? No/denies                               [facility] Adult PT Treatment/Exercise - 06/10/21 0001       Exercises   Exercises Lumbar      Lumbar Exercises: Aerobic   Nustep Lvl 5 x 15 mins      Lumbar Exercises: Standing   Heel Raises 20 reps   pain in right foot due to hammer toe   Heel Raises Limitations cues for technique      Lumbar Exercises: Seated   Sit to Stand 20 reps      Knee/Hip Exercises: Standing   Lateral Step Up Both;20 reps;Hand Hold: 2;Step Height: 6"    Forward Step Up Both;20 reps;Hand Hold: 2;Step Height: 6"    Forward Step Up Limitations cues to avoid pulling up with BUEs      Knee/Hip Exercises: Seated   Long Arc Quad Strengthening;Both;Weights;Limitations    Long Arc Quad Weight 4 lbs.  Balance Exercises - 06/10/21 0001       Balance Exercises: Standing   Standing Eyes Opened Narrow base of support (BOS);Foam/compliant surface;3 reps;30 secs    Tandem Stance Eyes open;3 reps;30 secs    Marching Foam/compliant surface;Upper extremity assist 2   3 mins                 PT Short Term Goals - 05/20/21 1034       PT SHORT TERM GOAL #1   Title Patient will be able to report feeling confident returning to her regularly scheduled exercise classess.    Time 3    Period Weeks    Status On-going    Target Date 06/03/21               PT Long Term Goals - 05/20/21 1035       PT LONG TERM GOAL #1   Title Patient will improve her 5x sit to stand time to 12 seconds or less for improved safety with transfers.    Baseline 15 seconds    Time 6    Period Weeks    Status On-going      PT LONG TERM GOAL #2   Title Patient will be independent with her home exercise program     Time 6    Period Weeks    Status On-going                   Plan - 06/10/21 4481     Clinical Impression Statement Pt arrives for today's treatment denying any pain.  Pt able to tolerate balance exercises very well today with minimal use of BUEs with any balance activity.  Pt tolerated additional reps with sit to stands without report of pain, but did endorse fatigue at completion of treatment.  Pt given seated rest breaks as needed throughout treatment due to fatigue.  Pt requiring min cues during heel raises for technique and posture.  Pt denied any pain at completion of today's treatment session.    Personal Factors and Comorbidities Age;Comorbidity 1;Time since onset of injury/illness/exacerbation    Comorbidities Parkinson's Disease    Examination-Activity Limitations Locomotion Level    Examination-Participation Restrictions Community Activity    Stability/Clinical Decision Making Stable/Uncomplicated    Rehab Potential Excellent    PT Frequency 2x / week    PT Duration 6 weeks    PT Treatment/Interventions ADLs/Self Care Home Management;Gait training;Stair training;Functional mobility training;Therapeutic activities;Therapeutic exercise;Balance training;Neuromuscular re-education;Patient/family education    PT Next Visit Plan focus on LE strengthening and balance activities    PT Home Exercise Plan seated marching and LAQ's    Consulted and Agree with Plan of Care Patient             Patient will benefit from skilled therapeutic intervention in order to improve the following deficits and impairments:  Abnormal gait, Difficulty walking, Decreased balance, Decreased strength  Visit Diagnosis: Unsteadiness on feet  Muscle weakness (generalized)     Problem List Patient Active Problem List   Diagnosis Date Noted   Osteoarthritis, multiple sites 02/03/2019   History of CVA (cerebrovascular accident) 02/03/2019   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   Senile purpura ([place]) 03/04/2017   Pure hypercholesterolemia 03/04/2017   Breast cancer of upper-outer quadrant of left female breast ([place]) 03/20/2015   Osteopenia 08/15/2013   Parkinson disease ([place])    Hypertension    Atrophic vaginitis    Detrusor instability  Melanoma ([place])    Mixed basal-squamous cell carcinoma     Kathrynn Ducking, PTA 06/10/2021, 10:37 AM  [facility] [address] [place], [place], [address] Phone: [phone]   Fax:  [phone]  Name: Tracey Juarez MRN: [id number] Date of Birth: October 12, 1940

## 2021-06-12 ENCOUNTER — Ambulatory Visit: Payer: Medicare PPO | Attending: Family Medicine

## 2021-06-12 ENCOUNTER — Other Ambulatory Visit: Payer: Self-pay

## 2021-06-12 DIAGNOSIS — R2681 Unsteadiness on feet: Secondary | ICD-10-CM | POA: Insufficient documentation

## 2021-06-12 DIAGNOSIS — M6281 Muscle weakness (generalized): Secondary | ICD-10-CM | POA: Insufficient documentation

## 2021-06-12 DIAGNOSIS — H04123 Dry eye syndrome of bilateral lacrimal glands: Secondary | ICD-10-CM | POA: Diagnosis not present

## 2021-06-12 NOTE — Therapy (Addendum)
[facility] [place], [place], [address] Phone: [phone]   Fax:  [phone]  Physical Therapy Treatment  Patient Details  Name: Tracey Juarez MRN: [id number] Date of Birth: Mar 10, 1941 Referring Provider (PT): Dettinger   Encounter Date: 06/12/2021   PT End of Session - 06/12/21 1118     Visit Number 10    Number of Visits 12    Date for PT Re-Evaluation 06/28/21    PT Start Time 1115    PT Stop Time 1200    PT Time Calculation (min) 45 min    Activity Tolerance Patient tolerated treatment well    Behavior During Therapy [facility] for tasks assessed/performed             Past Medical History:  Diagnosis Date   Atrophic vaginitis    Breast cancer in female [facility])    Breast cancer of upper-outer quadrant of left female breast ([place]) 03/20/2015   Detrusor instability    Elevated cholesterol    GERD (gastroesophageal reflux disease)    Hot flashes    Humerus head fracture, left, with routine healing, subsequent encounter 2000   Hypertension    Melanoma ([place])    Mixed basal-squamous cell carcinoma    skin   Parkinson disease ([place])    Personal history of radiation therapy    Stroke ([place])    on Plavix, no deficits    Past Surgical History:  Procedure Laterality Date   BRAIN SURGERY     BREAST LUMPECTOMY Left 04/2015   radiation   BREAST LUMPECTOMY WITH RADIOACTIVE SEED AND SENTINEL LYMPH NODE BIOPSY Left 04/25/2015   Procedure: LEFT BREAST PARTIAL MASTECTOMY WITH RADIOACTIVE SEED AND SENTINEL LYMPH NODE MAPPING;  Surgeon: Erroll Luna, MD;  Location: [place];  Service: General;  Laterality: Left;   FOOT SURGERY  2004   KNEE SURGERY  2007   skin cancer excised     TONSILLECTOMY      There were no vitals filed for this visit.   Subjective Assessment - 06/12/21 1117     Subjective COVID-19 screen performed prior to patient entering clinic. She denies any pain upon arriving for today's  treatment session.  Pt states that she just came from the eye doctor and drops that they put in are irritating her eyes.    Pertinent History Parkinson's Disease w/ Deep Brain Stimulator    Limitations Standing;Walking;House hold activities    Patient Stated Goals Return to exercising 3+ times per week, improve her strength and balance    Currently in Pain? No/denies                              [facility] Adult PT Treatment/Exercise - 06/12/21 0001       Transfers   Five time sit to stand comments  12.17 seconds      Exercises   Exercises Knee/Hip      Knee/Hip Exercises: Aerobic   Nustep Lvl 5 x 15 mins      Knee/Hip Exercises: Standing   Heel Raises Both;20 reps    Heel Raises Limitations B toes raises x 20 reps    Lateral Step Up Both;20 reps;Hand Hold: 2;Step Height: 6"    Forward Step Up Both;Hand Hold: 2;Step Height: 6";20 reps    Forward Step Up Limitations cues to avoid pull with UEs      Knee/Hip Exercises: Seated   Long Arc Quad Strengthening;Both;20 reps;Weights  Long Arc Quad Weight 4 lbs.    Long [employer] Limitations cues to decrease pace with reps    [facility] Seated 20 reps with 3 sec hold    Clamshell with TheraBand Red   20 reps                Balance Exercises - 06/12/21 0001       Balance Exercises: Standing   Standing Eyes Opened Narrow base of support (BOS);Foam/compliant surface    Standing Eyes Opened Time 2 mins; intermittent UE support    Tandem Gait Foam/compliant surface;Upper extremity support   Single UE support, 6 reps   Sidestepping Foam/compliant support;Upper extremity support   Single UE support, 6 reps   Marching Foam/compliant surface;Upper extremity assist 1   2 mins                 PT Short Term Goals - 06/12/21 1119       PT SHORT TERM GOAL #1   Title Patient will be able to report feeling confident returning to her regularly scheduled exercise classess.    Baseline 11/2: has not returned to  classes, but feels confident that she could    Time 3    Period Weeks    Status Achieved    Target Date 06/03/21               PT Long Term Goals - 06/12/21 1126       PT LONG TERM GOAL #1   Title Patient will improve her 5x sit to stand time to 12 seconds or less for improved safety with transfers.    Baseline 15 seconds, 11/2; 12.17 secs    Time 6    Period Weeks    Status On-going      PT LONG TERM GOAL #2   Title Patient will be independent with her home exercise program    Time 6    Period Weeks    Status On-going                   Plan - 06/12/21 1119     Clinical Impression Statement Pt arrives for today's treatment denying any pain.  Pt able to tolerate addition of side-stepping and tandem gait on Airex well.  Pt requiring use of single UE support to maintain balance.  Pt able to perform 5 sit to stand transfers in 12.17 seconds which has improved from 15 secs at eval.  Pt denies any pain at completion of treatment, but does endorse fatigue and the feeling of "working out.'    Personal Factors and Comorbidities Age;Comorbidity 1;Time since onset of injury/illness/exacerbation    Comorbidities Parkinson's Disease    Examination-Activity Limitations Locomotion Level    Examination-Participation Restrictions Community Activity    Stability/Clinical Decision Making Stable/Uncomplicated    Rehab Potential Excellent    PT Frequency 2x / week    PT Duration 6 weeks    PT Treatment/Interventions ADLs/Self Care Home Management;Gait training;Stair training;Functional mobility training;Therapeutic activities;Therapeutic exercise;Balance training;Neuromuscular re-education;Patient/family education    PT Next Visit Plan focus on LE strengthening and balance activities    PT Home Exercise Plan seated marching and LAQ's    Consulted and Agree with Plan of Care Patient             Patient will benefit from skilled therapeutic intervention in order to improve the  following deficits and impairments:  Abnormal gait, Difficulty walking, Decreased balance, Decreased strength  Visit Diagnosis: Unsteadiness  on feet  Muscle weakness (generalized)     Problem List Patient Active Problem List   Diagnosis Date Noted   Osteoarthritis, multiple sites 02/03/2019   History of CVA (cerebrovascular accident) 02/03/2019   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   Senile purpura ([place]) 03/04/2017   Pure hypercholesterolemia 03/04/2017   Breast cancer of upper-outer quadrant of left female breast ([place]) 03/20/2015   Osteopenia 08/15/2013   Parkinson disease ([place])    Hypertension    Atrophic vaginitis    Detrusor instability    Melanoma ([place])    Mixed basal-squamous cell carcinoma     Kathrynn Ducking, PTA 06/12/2021, 12:06 PM  [facility] [address] [place], [place], [address] Phone: [phone]   Fax:  [phone]  Name: Tracey Juarez MRN: [id number] Date of Birth: 04/16/1941  Progress Note Reporting Period 05/13/21 to 06/12/21  See note below for Objective Data and Assessment of Progress/Goals.    Patient is making good progress toward her therapy goals as she feels confident returning to the gym for her normal exercise program. She was almost able to meet her five time sit to stand goal as she was less than 0.2 seconds from achieving this goal. Recommend that she continue with her recommended plan of care to return to her prior level of function.   Jacqulynn Cadet, PT, DPT

## 2021-06-24 DIAGNOSIS — H16143 Punctate keratitis, bilateral: Secondary | ICD-10-CM | POA: Diagnosis not present

## 2021-06-25 ENCOUNTER — Other Ambulatory Visit: Payer: Self-pay

## 2021-06-25 ENCOUNTER — Encounter: Payer: Self-pay | Admitting: Physical Therapy

## 2021-06-25 ENCOUNTER — Ambulatory Visit: Payer: Medicare PPO | Admitting: Physical Therapy

## 2021-06-25 DIAGNOSIS — R2681 Unsteadiness on feet: Secondary | ICD-10-CM

## 2021-06-25 DIAGNOSIS — M6281 Muscle weakness (generalized): Secondary | ICD-10-CM

## 2021-06-25 NOTE — Therapy (Signed)
[facility] [place], [place], [address] Phone: [phone]   Fax:  [phone]  Physical Therapy Treatment  Patient Details  Name: Tracey Juarez MRN: [id number] Date of Birth: [birth year] Referring Provider (PT): Dettinger   Encounter Date: 06/25/2021   PT End of Session - 06/25/21 1003     Visit Number 11    Number of Visits 12    Date for PT Re-Evaluation 06/28/21    PT Start Time 0947    PT Stop Time 1026    PT Time Calculation (min) 39 min    Activity Tolerance Patient tolerated treatment well    Behavior During Therapy [facility] for tasks assessed/performed             Past Medical History:  Diagnosis Date   Atrophic vaginitis    Breast cancer in female [facility])    Breast cancer of upper-outer quadrant of left female breast ([place]) 03/20/2015   Detrusor instability    Elevated cholesterol    GERD (gastroesophageal reflux disease)    Hot flashes    Humerus head fracture, left, with routine healing, subsequent encounter 2000   Hypertension    Melanoma ([place])    Mixed basal-squamous cell carcinoma    skin   Parkinson disease ([place])    Personal history of radiation therapy    Stroke ([place])    on Plavix, no deficits    Past Surgical History:  Procedure Laterality Date   BRAIN SURGERY     BREAST LUMPECTOMY Left 04/2015   radiation   BREAST LUMPECTOMY WITH RADIOACTIVE SEED AND SENTINEL LYMPH NODE BIOPSY Left 04/25/2015   Procedure: LEFT BREAST PARTIAL MASTECTOMY WITH RADIOACTIVE SEED AND SENTINEL LYMPH NODE MAPPING;  Surgeon: Erroll Luna, MD;  Location: [place];  Service: General;  Laterality: Left;   FOOT SURGERY  2004   KNEE SURGERY  2007   skin cancer excised     TONSILLECTOMY      There were no vitals filed for this visit.   Subjective Assessment - 06/25/21 1003     Subjective COVID-19 screen performed prior to patient entering clinic. Is going to see a foot MD after PT. Reports  being weaker as she hasn't been eating over the last week or so because of dental procedures and being on liquid diet.    Pertinent History Parkinson's Disease w/ Deep Brain Stimulator    Limitations Standing;Walking;House hold activities    Patient Stated Goals Return to exercising 3+ times per week, improve her strength and balance    Currently in Pain? No/denies                [facility] PT Assessment - 06/25/21 0001       Assessment   Medical Diagnosis Fall and weakness    Referring Provider (PT) Dettinger    Onset Date/Surgical Date 01/30/21    Hand Dominance Right    Next MD Visit Febuary 2023    Prior Therapy No      Precautions   Precautions Fall                           [facility] Adult PT Treatment/Exercise - 06/25/21 0001       Knee/Hip Exercises: Aerobic   Nustep Lvl 5 x 15 mins      Knee/Hip Exercises: Standing   Hip Flexion AROM;Both;2 sets;10 reps;Knee bent      Knee/Hip Exercises: Seated   Long Arc  Quad Strengthening;Both;20 reps;Weights    Long [employer] Weight 3 lbs.    Long [employer] Limitations cues to decrease pace with reps    Clamshell with [employer]   x30 reps   Other Seated Knee/Hip Exercises B heel/toe raises x20 reps    Marching Strengthening;Both;3 sets;10 reps    Marching Limitations red theraband    Hamstring Curl Strengthening;Both;2 sets;10 reps    Hamstring Limitations red theraband                       PT Short Term Goals - 06/12/21 1119       PT SHORT TERM GOAL #1   Title Patient will be able to report feeling confident returning to her regularly scheduled exercise classess.    Baseline 11/2: has not returned to classes, but feels confident that she could    Time 3    Period Weeks    Status Achieved    Target Date 06/03/21               PT Long Term Goals - 06/25/21 1023       PT LONG TERM GOAL #1   Title Patient will improve her 5x sit to stand time to 12 seconds or less for improved  safety with transfers.    Baseline 15 seconds, 11/2; 12.17 secs    Time 6    Period Weeks    Status On-going      PT LONG TERM GOAL #2   Title Patient will be independent with her home exercise program    Time 6    Period Weeks    Status Achieved                   Plan - 06/25/21 1027     Clinical Impression Statement Patient limited with progression of the exercises due to pain in L foot/toe and feeling weak from eating only liquids for several days. Patient to make an appointment with the podiatrist following PT. Good control noted with sit <> stands without UE support.    Personal Factors and Comorbidities Age;Comorbidity 1;Time since onset of injury/illness/exacerbation    Comorbidities Parkinson's Disease    Examination-Activity Limitations Locomotion Level    Examination-Participation Restrictions Community Activity    Stability/Clinical Decision Making Stable/Uncomplicated    Rehab Potential Excellent    PT Frequency 2x / week    PT Duration 6 weeks    PT Treatment/Interventions ADLs/Self Care Home Management;Gait training;Stair training;Functional mobility training;Therapeutic activities;Therapeutic exercise;Balance training;Neuromuscular re-education;Patient/family education    PT Next Visit Plan D/C    PT Home Exercise Plan seated marching and LAQ's    Consulted and Agree with Plan of Care Patient             Patient will benefit from skilled therapeutic intervention in order to improve the following deficits and impairments:  Abnormal gait, Difficulty walking, Decreased balance, Decreased strength  Visit Diagnosis: Unsteadiness on feet  Muscle weakness (generalized)     Problem List Patient Active Problem List   Diagnosis Date Noted   Osteoarthritis, multiple sites 02/03/2019   History of CVA (cerebrovascular accident) 02/03/2019   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   Senile purpura ([place])  03/04/2017   Pure hypercholesterolemia 03/04/2017   Breast cancer of upper-outer quadrant of left female breast ([place]) 03/20/2015   Osteopenia 08/15/2013   Parkinson disease ([place])    Hypertension    Atrophic vaginitis  Detrusor instability    Melanoma ([facility])    Mixed basal-squamous cell carcinoma     Standley Brooking, PTA 06/25/2021, 10:29 AM  [facility] [address] [place], [place], [address] Phone: [phone]   Fax:  [phone]  Name: Tracey Juarez MRN: [id number] Date of Birth: [birth year]

## 2021-06-27 ENCOUNTER — Other Ambulatory Visit: Payer: Self-pay

## 2021-06-27 ENCOUNTER — Ambulatory Visit: Payer: Medicare PPO

## 2021-06-27 DIAGNOSIS — M6281 Muscle weakness (generalized): Secondary | ICD-10-CM | POA: Diagnosis not present

## 2021-06-27 DIAGNOSIS — R2681 Unsteadiness on feet: Secondary | ICD-10-CM | POA: Diagnosis not present

## 2021-06-27 NOTE — Therapy (Addendum)
[facility] [place], [place], [address] Phone: [phone]   Fax:  [phone]  Physical Therapy Treatment  Patient Details  Name: Tracey Juarez MRN: [id number] Date of Birth: [birth year] Referring Provider (PT): Dettinger   Encounter Date: 06/27/2021   PT End of Session - 06/27/21 0956     Visit Number 12    Number of Visits 12    Date for PT Re-Evaluation 06/28/21    PT Start Time 0945    PT Stop Time 1027    PT Time Calculation (min) 42 min    Activity Tolerance Patient tolerated treatment well    Behavior During Therapy [facility] for tasks assessed/performed             Past Medical History:  Diagnosis Date   Atrophic vaginitis    Breast cancer in female [facility])    Breast cancer of upper-outer quadrant of left female breast ([place]) 03/20/2015   Detrusor instability    Elevated cholesterol    GERD (gastroesophageal reflux disease)    Hot flashes    Humerus head fracture, left, with routine healing, subsequent encounter 2000   Hypertension    Melanoma ([place])    Mixed basal-squamous cell carcinoma    skin   Parkinson disease ([place])    Personal history of radiation therapy    Stroke ([place])    on Plavix, no deficits    Past Surgical History:  Procedure Laterality Date   BRAIN SURGERY     BREAST LUMPECTOMY Left 04/2015   radiation   BREAST LUMPECTOMY WITH RADIOACTIVE SEED AND SENTINEL LYMPH NODE BIOPSY Left 04/25/2015   Procedure: LEFT BREAST PARTIAL MASTECTOMY WITH RADIOACTIVE SEED AND SENTINEL LYMPH NODE MAPPING;  Surgeon: Erroll Luna, MD;  Location: [place];  Service: General;  Laterality: Left;   FOOT SURGERY  2004   KNEE SURGERY  2007   skin cancer excised     TONSILLECTOMY      There were no vitals filed for this visit.   Subjective Assessment - 06/27/21 0955     Subjective COVID-19 screen performed prior to patient entering clinic. Pt denies any pain upon arriving for today's  treatment session.  Pt states that she is feeling much better since beginning therapy.    Pertinent History Parkinson's Disease w/ Deep Brain Stimulator    Limitations Standing;Walking;House hold activities    Patient Stated Goals Return to exercising 3+ times per week, improve her strength and balance    Currently in Pain? No/denies                               [facility] Adult PT Treatment/Exercise - 06/27/21 0001       Transfers   Sit to Stand 6: Modified independent (Device/Increase time)    Five time sit to stand comments  11.06 seconds      Knee/Hip Exercises: Aerobic   Nustep Lvl 5 x 15 mins      Knee/Hip Exercises: Standing   Lateral Step Up 1 set;20 reps;Hand Hold: 2;Step Height: 6"    Lateral Step Up Limitations Cues to avoid pull with BUEs    Forward Step Up Both;20 reps;Hand Hold: 2;Step Height: 6"    Forward Step Up Limitations Cues to not go too fast      Knee/Hip Exercises: Seated   Long Arc Quad Strengthening;Both;20 reps;Weights    Long Arc Quad Weight 3 lbs.  Long [employer] Limitations cues to [employer] 20 reps with 3 sec hold    Clamshell with TheraBand Red   20 reps cues for eccentric control   Marching Strengthening;Both;20 reps;Weights    Marching Limitations cues for pacing    Marching Weights 3 lbs.    Hamstring Curl Strengthening;Both;20 reps    Hamstring Limitations red tband                       PT Short Term Goals - 06/12/21 1119       PT SHORT TERM GOAL #1   Title Patient will be able to report feeling confident returning to her regularly scheduled exercise classess.    Baseline 11/2: has not returned to classes, but feels confident that she could    Time 3    Period Weeks    Status Achieved    Target Date 06/03/21               PT Long Term Goals - 06/27/21 0957       PT LONG TERM GOAL #1   Title Patient will improve her 5x sit to stand time to 12 seconds or less for improved  safety with transfers.    Baseline 15 seconds, 11/2; 12.17 secs    Time 6    Period Weeks    Status On-going      PT LONG TERM GOAL #2   Title Patient will be independent with her home exercise program    Time 6    Period Weeks    Status Achieved                   Plan - 06/27/21 0957     Clinical Impression Statement Pt arrives for today's treatment session denying any pain.  Pt with increased left toe pain with standing exercises, but able to perform all exercises and reps asked of her.  Pt has met all of her goals set forth for her at this time.  Pt has an appointment with the podiatrist to address her toe pain.  Pt states that she feels ready for discharge at this time.    Personal Factors and Comorbidities Age;Comorbidity 1;Time since onset of injury/illness/exacerbation    Comorbidities Parkinson's Disease    Examination-Activity Limitations Locomotion Level    Examination-Participation Restrictions Community Activity    Stability/Clinical Decision Making Stable/Uncomplicated    Rehab Potential Excellent    PT Frequency 2x / week    PT Duration 6 weeks    PT Treatment/Interventions ADLs/Self Care Home Management;Gait training;Stair training;Functional mobility training;Therapeutic activities;Therapeutic exercise;Balance training;Neuromuscular re-education;Patient/family education    PT Next Visit Plan D/C    PT Home Exercise Plan seated marching and LAQ's    Consulted and Agree with Plan of Care Patient             Patient will benefit from skilled therapeutic intervention in order to improve the following deficits and impairments:  Abnormal gait, Difficulty walking, Decreased balance, Decreased strength  Visit Diagnosis: Unsteadiness on feet  Muscle weakness (generalized)     Problem List Patient Active Problem List   Diagnosis Date Noted   Osteoarthritis, multiple sites 02/03/2019   History of CVA (cerebrovascular accident) 02/03/2019   Postural  kyphosis of thoracic region 01/11/2018   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   Senile purpura ([place]) 03/04/2017   Pure hypercholesterolemia 03/04/2017   Breast cancer of upper-outer quadrant of left female breast ([place])  03/20/2015   Osteopenia 08/15/2013   Parkinson disease ([place])    Hypertension    Atrophic vaginitis    Detrusor instability    Melanoma ([place])    Mixed basal-squamous cell carcinoma     Kathrynn Ducking, PTA 06/27/2021, 10:30 AM  [facility] [address] [place], [place], [address] Phone: [phone]   Fax:  [phone]  Name: Tracey Juarez MRN: [id number] Date of Birth: August 29, 1940  PHYSICAL THERAPY DISCHARGE SUMMARY  Visits from Start of Care: 12  Current functional level related to goals / functional outcomes: Patient was able to meet all of her goals for physical therapy and feels comfortable being discharged at this time.    Remaining deficits: None   Education / Equipment: HEP    Patient agrees to discharge. Patient goals were met. Patient is being discharged due to meeting the stated rehab goals.

## 2021-07-10 ENCOUNTER — Other Ambulatory Visit: Payer: Self-pay

## 2021-07-10 DIAGNOSIS — C50412 Malignant neoplasm of upper-outer quadrant of left female breast: Secondary | ICD-10-CM

## 2021-07-10 MED ORDER — ANASTROZOLE 1 MG PO TABS: 1.0000 mg | ORAL_TABLET | Freq: Every day | ORAL | 2 refills | Status: DC

## 2021-07-17 DIAGNOSIS — H16291 Other keratoconjunctivitis, right eye: Secondary | ICD-10-CM | POA: Diagnosis not present

## 2021-07-18 DIAGNOSIS — L03031 Cellulitis of right toe: Secondary | ICD-10-CM | POA: Diagnosis not present

## 2021-07-25 ENCOUNTER — Other Ambulatory Visit: Payer: Self-pay | Admitting: Family Medicine

## 2021-07-25 DIAGNOSIS — I10 Essential (primary) hypertension: Secondary | ICD-10-CM

## 2021-07-30 DIAGNOSIS — L03032 Cellulitis of left toe: Secondary | ICD-10-CM | POA: Diagnosis not present

## 2021-07-30 DIAGNOSIS — M79675 Pain in left toe(s): Secondary | ICD-10-CM | POA: Diagnosis not present

## 2021-07-30 DIAGNOSIS — H04123 Dry eye syndrome of bilateral lacrimal glands: Secondary | ICD-10-CM | POA: Diagnosis not present

## 2021-08-27 ENCOUNTER — Ambulatory Visit: Payer: Medicare PPO

## 2021-09-08 ENCOUNTER — Other Ambulatory Visit: Payer: Self-pay | Admitting: Family Medicine

## 2021-09-18 ENCOUNTER — Encounter: Payer: Self-pay | Admitting: Family Medicine

## 2021-09-18 ENCOUNTER — Ambulatory Visit (INDEPENDENT_AMBULATORY_CARE_PROVIDER_SITE_OTHER): Payer: Medicare PPO | Admitting: Family Medicine

## 2021-09-18 VITALS — BP 132/55 | HR 67 | Ht 62.0 in | Wt 123.0 lb

## 2021-09-18 DIAGNOSIS — N3281 Overactive bladder: Secondary | ICD-10-CM | POA: Diagnosis not present

## 2021-09-18 DIAGNOSIS — I10 Essential (primary) hypertension: Secondary | ICD-10-CM

## 2021-09-18 DIAGNOSIS — Z23 Encounter for immunization: Secondary | ICD-10-CM | POA: Diagnosis not present

## 2021-09-18 DIAGNOSIS — M8589 Other specified disorders of bone density and structure, multiple sites: Secondary | ICD-10-CM | POA: Diagnosis not present

## 2021-09-18 DIAGNOSIS — E7800 Pure hypercholesterolemia, unspecified: Secondary | ICD-10-CM | POA: Diagnosis not present

## 2021-09-18 MED ORDER — OXYBUTYNIN CHLORIDE ER 10 MG PO TB24: 10.0000 mg | ORAL_TABLET | Freq: Every day | ORAL | 3 refills | Status: DC

## 2021-09-18 MED ORDER — ROSUVASTATIN CALCIUM 10 MG PO TABS: 10.0000 mg | ORAL_TABLET | Freq: Every day | ORAL | 3 refills | Status: DC

## 2021-09-18 MED ORDER — LISINOPRIL 10 MG PO TABS: 10.0000 mg | ORAL_TABLET | Freq: Every day | ORAL | 3 refills | Status: DC

## 2021-09-18 NOTE — Progress Notes (Signed)
BP (!) 132/55    Pulse 67    Ht _0  (1.575 m)    Wt 123 lb (55.8 kg)    SpO2 98%    BMI 22.50 kg/m    Subjective:   Patient ID: Tracey Juarez, female    DOB: 1940-12-22, 81 y.o.   MRN: [id number]  HPI: Tracey Juarez is a 81 y.o. female presenting on 09/18/2021 for Medical Management of Chronic Issues, Hyperlipidemia, and Hypertension   HPI Hypertension Patient is currently on lisinopril, and their blood pressure today is 132/55. Patient denies any lightheadedness or dizziness. Patient denies headaches, blurred vision, chest pains, shortness of breath, or weakness. Denies any side effects from medication and is content with current medication.   Hyperlipidemia Patient is coming in for recheck of his hyperlipidemia. The patient is currently taking Crestor. They deny any issues with myalgias or history of liver damage from it. They deny any focal numbness or weakness or chest pain.   Overactive bladder and detrusor instability Patient takes oxybutynin for overactive bladder and detrusor instability and she feels like it does well for her.  She denies any major issues.  Osteoporosis/osteopenia Fractures or history of fracture: None Medication: Calcium and vitamin D because she stopped the risedronate Duration of treatment: Couple years Last bone density scan: 12/25/2020 Last T score: -1.8  Relevant past medical, surgical, family and social history reviewed and updated as indicated. Interim medical history since our last visit reviewed. Allergies and medications reviewed and updated.  Review of Systems  Constitutional:  Negative for chills and fever.  Eyes:  Negative for redness and visual disturbance.  Respiratory:  Negative for chest tightness and shortness of breath.   Cardiovascular:  Negative for chest pain and leg swelling.  Musculoskeletal:  Negative for back pain and gait problem.  Skin:  Negative for rash.  Neurological:  Negative for dizziness,  light-headedness and headaches.  Psychiatric/Behavioral:  Negative for agitation and behavioral problems.   All other systems reviewed and are negative.  Per HPI unless specifically indicated above   Allergies as of 09/18/2021       Reactions   Ondansetron Swelling, Palpitations        Medication List        Accurate as of September 18, 2021 11:28 AM. If you have any questions, ask your nurse or doctor.          STOP taking these medications    Risedronate Sodium 35 MG Tbec Stopped by: Fransisca Kaufmann Davaughn Hillyard, MD       TAKE these medications    acetaminophen 325 MG tablet Commonly known as: TYLENOL Take 650 mg by mouth every 6 (six) hours as needed.   anastrozole 1 MG tablet Commonly known as: ARIMIDEX Take 1 tablet (1 mg total) by mouth daily.   Carbidopa-Levodopa ER 25-100 MG tablet controlled release Commonly known as: SINEMET CR Take 1 tablet by mouth 3 (three) times daily.   cetirizine 10 MG tablet Commonly known as: ZYRTEC Take 10 mg by mouth daily.   clopidogrel 75 MG tablet Commonly known as: PLAVIX Take 75 mg by mouth daily.   famotidine 10 MG tablet Commonly known as: PEPCID Take 10 mg by mouth daily.   fluticasone 50 MCG/ACT nasal spray Commonly known as: FLONASE USE 1 SPRAY(S) IN EACH NOSTRIL TWICE DAILY AS NEEDED FOR ALLERGIES OR RHINITIS   Ginger Root 550 MG Caps Take by mouth.   lisinopril 10 MG tablet Commonly known as: ZESTRIL Take 1  tablet (10 mg total) by mouth daily.   Magnesium 250 MG Tabs Take 1 tablet by mouth daily.   meclizine 12.5 MG tablet Commonly known as: ANTIVERT TAKE 1 TO 2 TABLETS BY MOUTH THREE TIMES DAILY AS NEEDED FOR DIZZINESS   multivitamin capsule Take 1 capsule by mouth daily.   oxybutynin 10 MG 24 hr tablet Commonly known as: DITROPAN-XL Take 1 tablet (10 mg total) by mouth at bedtime.   rosuvastatin 10 MG tablet Commonly known as: CRESTOR Take 1 tablet (10 mg total) by mouth daily.   Vitamin D3  1.25 MG (50000 UT) Tabs Take by mouth. Taking 4,000 ui daily         Objective:   BP (!) 132/55    Pulse 67    Ht _0  (1.575 m)    Wt 123 lb (55.8 kg)    SpO2 98%    BMI 22.50 kg/m   Wt Readings from Last 3 Encounters:  09/18/21 123 lb (55.8 kg)  05/01/21 137 lb (62.1 kg)  03/21/21 137 lb (62.1 kg)    Physical Exam Vitals and nursing note reviewed.  Constitutional:      General: She is not in acute distress.    Appearance: She is well-developed. She is not diaphoretic.  Eyes:     Conjunctiva/sclera: Conjunctivae normal.  Cardiovascular:     Rate and Rhythm: Normal rate and regular rhythm.     Heart sounds: Normal heart sounds. No murmur heard. Pulmonary:     Effort: Pulmonary effort is normal. No respiratory distress.     Breath sounds: Normal breath sounds. No wheezing.  Musculoskeletal:        General: No tenderness. Normal range of motion.  Skin:    General: Skin is warm and dry.     Findings: No rash.  Neurological:     Mental Status: She is alert and oriented to person, place, and time.     Coordination: Coordination normal.  Psychiatric:        Behavior: Behavior normal.      Assessment & Plan:   Problem List Items Addressed This Visit       Cardiovascular and Mediastinum   Hypertension - Primary   Relevant Medications   rosuvastatin (CRESTOR) 10 MG tablet   lisinopril (ZESTRIL) 10 MG tablet   Other Relevant Orders   CBC with Differential/Platelet   CMP14+EGFR   Lipid panel     Musculoskeletal and Integument   Osteopenia   Relevant Orders   CBC with Differential/Platelet   CMP14+EGFR   Lipid panel     Genitourinary   Detrusor instability   Relevant Medications   oxybutynin (DITROPAN-XL) 10 MG 24 hr tablet     Other   Pure hypercholesterolemia   Relevant Medications   rosuvastatin (CRESTOR) 10 MG tablet   lisinopril (ZESTRIL) 10 MG tablet   Other Relevant Orders   CBC with Differential/Platelet   CMP14+EGFR   Lipid panel   Other  Visit Diagnoses     Essential hypertension       Relevant Medications   rosuvastatin (CRESTOR) 10 MG tablet   lisinopril (ZESTRIL) 10 MG tablet   Other Relevant Orders   CBC with Differential/Platelet   CMP14+EGFR   Lipid panel   Need for Tdap vaccination       Relevant Orders   Tdap vaccine greater than or equal to 7yo IM   Need for shingles vaccine       Relevant Orders   Varicella-zoster vaccine subcutaneous  Patient stopped the medicine for her osteopenia on her own after she fell and did not get any fractures and she feels like she does well with her bones.  We will recheck at next bone density test to see if we want to restart.  Blood pressure looks good.  No other change in medicine. Follow up plan: Return in about 6 months (around 03/18/2022), or if symptoms worsen or fail to improve, for Hypertension and cholesterol and osteopenia recheck.  Counseling provided for all of the vaccine components Orders Placed This Encounter  Procedures   Tdap vaccine greater than or equal to 7yo IM   Varicella-zoster vaccine subcutaneous   CBC with Differential/Platelet   CMP14+EGFR   Lipid panel    Caryl Pina, MD [facility] 09/18/2021, 11:28 AM

## 2021-09-19 LAB — LIPID PANEL

## 2021-09-20 ENCOUNTER — Ambulatory Visit: Payer: Medicare PPO | Admitting: Family Medicine

## 2021-09-20 LAB — CBC WITH DIFFERENTIAL/PLATELET
Basophils Absolute: 0 10*3/uL (ref 0.0–0.2)
Basos: 1 %
EOS (ABSOLUTE): 0 10*3/uL (ref 0.0–0.4)
Eos: 1 %
Hematocrit: 42.5 % (ref 34.0–46.6)
Hemoglobin: 13.5 g/dL (ref 11.1–15.9)
Immature Grans (Abs): 0 10*3/uL (ref 0.0–0.1)
Immature Granulocytes: 0 %
Lymphocytes Absolute: 1.1 10*3/uL (ref 0.7–3.1)
Lymphs: 16 %
MCH: 30.5 pg (ref 26.6–33.0)
MCHC: 31.8 g/dL (ref 31.5–35.7)
MCV: 96 fL (ref 79–97)
Monocytes Absolute: 0.7 10*3/uL (ref 0.1–0.9)
Monocytes: 10 %
Neutrophils Absolute: 4.7 10*3/uL (ref 1.4–7.0)
Neutrophils: 72 %
Platelets: 190 10*3/uL (ref 150–450)
RBC: 4.43 x10E6/uL (ref 3.77–5.28)
RDW: 12.6 % (ref 11.7–15.4)
WBC: 6.5 10*3/uL (ref 3.4–10.8)

## 2021-09-20 LAB — CMP14+EGFR
ALT: 6 IU/L (ref 0–32)
AST: 22 IU/L (ref 0–40)
Albumin/Globulin Ratio: 2.2 (ref 1.2–2.2)
Albumin: 4.6 g/dL (ref 3.7–4.7)
Alkaline Phosphatase: 63 IU/L (ref 44–121)
BUN/Creatinine Ratio: 22 (ref 12–28)
BUN: 17 mg/dL (ref 8–27)
Bilirubin Total: 0.6 mg/dL (ref 0.0–1.2)
CO2: 24 mmol/L (ref 20–29)
Calcium: 9.8 mg/dL (ref 8.7–10.3)
Chloride: 104 mmol/L (ref 96–106)
Creatinine, Ser: 0.76 mg/dL (ref 0.57–1.00)
Globulin, Total: 2.1 g/dL (ref 1.5–4.5)
Glucose: 89 mg/dL (ref 70–99)
Potassium: 4.4 mmol/L (ref 3.5–5.2)
Sodium: 144 mmol/L (ref 134–144)
Total Protein: 6.7 g/dL (ref 6.0–8.5)
eGFR: 80 mL/min/{1.73_m2} (ref >59)

## 2021-09-20 LAB — LIPID PANEL
Chol/HDL Ratio: 2.2 ratio (ref 0.0–4.4)
Cholesterol, Total: 159 mg/dL (ref 100–199)
HDL: 73 mg/dL (ref >39)
LDL Chol Calc (NIH): 70 mg/dL (ref 0–99)
Triglycerides: 88 mg/dL (ref 0–149)
VLDL Cholesterol Cal: 16 mg/dL (ref 5–40)

## 2021-09-23 ENCOUNTER — Other Ambulatory Visit: Payer: Self-pay | Admitting: Hematology

## 2021-09-23 DIAGNOSIS — Z1231 Encounter for screening mammogram for malignant neoplasm of breast: Secondary | ICD-10-CM

## 2021-09-25 ENCOUNTER — Ambulatory Visit: Payer: Medicare PPO

## 2021-10-03 DIAGNOSIS — L03032 Cellulitis of left toe: Secondary | ICD-10-CM | POA: Diagnosis not present

## 2021-10-07 ENCOUNTER — Inpatient Hospital Stay: Payer: Medicare PPO

## 2021-10-07 ENCOUNTER — Inpatient Hospital Stay: Payer: Medicare PPO | Admitting: Nurse Practitioner

## 2021-10-16 DIAGNOSIS — Z9689 Presence of other specified functional implants: Secondary | ICD-10-CM | POA: Diagnosis not present

## 2021-10-16 DIAGNOSIS — G20 Parkinson's disease: Secondary | ICD-10-CM | POA: Diagnosis not present

## 2021-10-16 DIAGNOSIS — Z4542 Encounter for adjustment and management of neuropacemaker (brain) (peripheral nerve) (spinal cord): Secondary | ICD-10-CM | POA: Diagnosis not present

## 2021-10-30 ENCOUNTER — Ambulatory Visit: Payer: Medicare PPO

## 2021-11-05 DIAGNOSIS — L570 Actinic keratosis: Secondary | ICD-10-CM | POA: Diagnosis not present

## 2021-11-05 DIAGNOSIS — Z85828 Personal history of other malignant neoplasm of skin: Secondary | ICD-10-CM | POA: Diagnosis not present

## 2021-11-05 DIAGNOSIS — Z85820 Personal history of malignant melanoma of skin: Secondary | ICD-10-CM | POA: Diagnosis not present

## 2021-11-12 ENCOUNTER — Inpatient Hospital Stay: Payer: Medicare PPO

## 2021-11-12 ENCOUNTER — Inpatient Hospital Stay: Payer: Medicare PPO | Admitting: Nurse Practitioner

## 2021-12-04 DIAGNOSIS — G20 Parkinson's disease: Secondary | ICD-10-CM | POA: Diagnosis not present

## 2021-12-04 DIAGNOSIS — Z79899 Other long term (current) drug therapy: Secondary | ICD-10-CM | POA: Diagnosis not present

## 2021-12-04 DIAGNOSIS — I6782 Cerebral ischemia: Secondary | ICD-10-CM | POA: Diagnosis not present

## 2021-12-06 ENCOUNTER — Ambulatory Visit (INDEPENDENT_AMBULATORY_CARE_PROVIDER_SITE_OTHER): Payer: Medicare PPO

## 2021-12-06 VITALS — Wt 124.0 lb

## 2021-12-06 DIAGNOSIS — H9312 Tinnitus, left ear: Secondary | ICD-10-CM | POA: Insufficient documentation

## 2021-12-06 DIAGNOSIS — Z0000 Encounter for general adult medical examination without abnormal findings: Secondary | ICD-10-CM | POA: Diagnosis not present

## 2021-12-06 DIAGNOSIS — R42 Dizziness and giddiness: Secondary | ICD-10-CM | POA: Insufficient documentation

## 2021-12-06 NOTE — Progress Notes (Signed)
? ?Subjective:  ? Tracey Juarez is a 81 y.o. female who presents for Medicare Annual (Subsequent) preventive examination. ? ?Virtual Visit via Telephone Note ? ?I connected with  Abby Potash on 12/06/21 at 12:00 PM EDT by telephone and verified that I am speaking with the correct person using two identifiers. ? ?Location: ?Patient: Home ?Provider: [facility] ?Persons participating in the virtual visit: patient/Nurse Health Advisor ?  ?I discussed the limitations, risks, security and privacy concerns of performing an evaluation and management service by telephone and the availability of in person appointments. The patient expressed understanding and agreed to proceed. ? ?Interactive audio and video telecommunications were attempted between this nurse and patient, however failed, due to patient having technical difficulties OR patient did not have access to video capability.  We continued and completed visit with audio only. ? ?Some vital signs may be absent or patient reported.  ? ?Marya Lowden Dionne Ano, LPN  ? ?Review of Systems    ? ?Cardiac Risk Factors include: advanced age (>51mn, >>62women);hypertension;dyslipidemia;Other (see comment), Risk factor comments: hx of stroke, Parkinson disease ? ?   ?Objective:  ?  ?Today's Vitals  ? 12/06/21 1159  ?Weight: 124 lb (56.2 kg)  ? ?Body mass index is 22.68 kg/m?. ? ? ?  12/06/2021  ? 12:09 PM 05/13/2021  ? 10:21 AM 02/12/2021  ? 10:24 AM 11/30/2020  ? 10:44 AM 11/24/2019  ? 10:57 AM 05/06/2017  ? 11:31 AM 09/18/2016  ? 10:23 AM  ?Advanced Directives  ?Does Patient Have a Medical Advance Directive? Yes Yes Yes No;Yes Yes Yes Yes  ?Type of [place]of [place]  [place] Living will;Healthcare Power of Attorney    ?Does patient want to make changes to medical advance directive?    No - Patient declined No - Patient declined    ?Copy of [facility]  Chart? No - copy requested    No - copy requested    ?Would patient like information on creating a medical advance directive?    No - Patient declined     ? ? ?Current Medications (verified) ?Outpatient Encounter Medications as of 12/06/2021  ?Medication Sig  ? acetaminophen (TYLENOL) 325 MG tablet Take 650 mg by mouth every 6 (six) hours as needed.  ? anastrozole (ARIMIDEX) 1 MG tablet Take 1 tablet (1 mg total) by mouth daily.  ? Carbidopa-Levodopa ER (SINEMET CR) 25-100 MG tablet controlled release Take 1 tablet by mouth 3 (three) times daily.  ? cetirizine (ZYRTEC) 10 MG tablet Take 10 mg by mouth daily.  ? Cholecalciferol (VITAMIN D3) 1.25 MG (50000 UT) TABS Take by mouth. Taking 4,000 ui daily  ? clopidogrel (PLAVIX) 75 MG tablet Take 75 mg by mouth daily.  ? famotidine (PEPCID) 10 MG tablet Take 10 mg by mouth daily.  ? fluticasone (FLONASE) 50 MCG/ACT nasal spray USE 1 SPRAY(S) IN EACH NOSTRIL TWICE DAILY AS NEEDED FOR ALLERGIES OR RHINITIS  ? Ginger, Zingiber officinalis, (GINGER ROOT) 550 MG CAPS Take by mouth.  ? lisinopril (ZESTRIL) 10 MG tablet Take 1 tablet (10 mg total) by mouth daily.  ? Magnesium 250 MG TABS Take 1 tablet by mouth daily.  ? Multiple Vitamin (MULTIVITAMIN) capsule Take 1 capsule by mouth daily.  ? oxybutynin (DITROPAN-XL) 10 MG 24 hr tablet Take 1 tablet (10 mg total) by mouth at bedtime.  ? rosuvastatin (CRESTOR) 10 MG tablet Take 1 tablet (10 mg  total) by mouth daily.  ? meclizine (ANTIVERT) 12.5 MG tablet TAKE 1 TO 2 TABLETS BY MOUTH THREE TIMES DAILY AS NEEDED FOR DIZZINESS (Patient not taking: Reported on 12/06/2021)  ? ?No facility-administered encounter medications on file as of 12/06/2021.  ? ? ?Allergies (verified) ?Ondansetron  ? ?History: ?Past Medical History:  ?Diagnosis Date  ? Atrophic vaginitis   ? Breast cancer in female [facility])   ? Breast cancer of upper-outer quadrant of left female breast ([place]) 03/20/2015  ? Detrusor instability   ? Elevated cholesterol   ? GERD  (gastroesophageal reflux disease)   ? Hot flashes   ? Humerus head fracture, left, with routine healing, subsequent encounter 2000  ? Hypertension   ? Melanoma ([place])   ? Mixed basal-squamous cell carcinoma   ? skin  ? Parkinson disease ([place])   ? Personal history of radiation therapy   ? Stroke [facility])   ? on Plavix, no deficits  ? ?Past Surgical History:  ?Procedure Laterality Date  ? BRAIN SURGERY    ? BREAST LUMPECTOMY Left 04/2015  ? radiation  ? BREAST LUMPECTOMY WITH RADIOACTIVE SEED AND SENTINEL LYMPH NODE BIOPSY Left 04/25/2015  ? Procedure: LEFT BREAST PARTIAL MASTECTOMY WITH RADIOACTIVE SEED AND SENTINEL LYMPH NODE MAPPING;  Surgeon: Erroll Luna, MD;  Location: [facility];  Service: General;  Laterality: Left;  ? FOOT SURGERY  2004  ? KNEE SURGERY  2007  ? skin cancer excised    ? TONSILLECTOMY    ? ?Family History  ?Problem Relation Age of Onset  ? Hypertension Mother   ? Heart attack Mother   ? Heart attack Maternal Aunt   ? Heart attack Maternal Uncle   ? Breast cancer Neg Hx   ? ?Social History  ? ?Socioeconomic History  ? Marital status: Married  ?  Spouse name: Joneen Boers   ? Number of children: 0  ? Years of education: ged  ? Highest education level: GED or equivalent  ?Occupational History  ? Occupation: Retired  ?Tobacco Use  ? Smoking status: Never  ? Smokeless tobacco: Never  ?Vaping Use  ? Vaping Use: Never used  ?Substance and Sexual Activity  ? Alcohol use: No  ?  Alcohol/week: 0.0 standard drinks  ? Drug use: No  ? Sexual activity: Never  ?  Birth control/protection: Post-menopausal  ?  Comment: 1st intercourse 74 yo-2 partners  ?Other Topics Concern  ? Not on file  ?Social History Narrative  ? Not on file  ? ?Social Determinants of Health  ? ?Financial Resource Strain: Low Risk   ? Difficulty of Paying Living Expenses: Not hard at all  ?Food Insecurity: No Food Insecurity  ? Worried About Charity fundraiser in the Last Year: Never true  ? Ran Out of Food in the Last Year: Never  true  ?Transportation Needs: No Transportation Needs  ? Lack of Transportation (Medical): No  ? Lack of Transportation (Non-Medical): No  ?Physical Activity: Insufficiently Active  ? Days of Exercise per Week: 7 days  ? Minutes of Exercise per Session: 20 min  ?Stress: No Stress Concern Present  ? Feeling of Stress : Not at all  ?Social Connections: Socially Integrated  ? Frequency of Communication with Friends and Family: More than three times a week  ? Frequency of Social Gatherings with Friends and Family: Twice a week  ? Attends Religious Services: More than 4 times per year  ? Active Member of Clubs or Organizations: Yes  ? Attends Club  or Organization Meetings: More than 4 times per year  ? Marital Status: Married  ? ? ?Tobacco Counseling ?Counseling given: Not Answered ? ? ?Clinical Intake: ? ?Pre-visit preparation completed: Yes ? ?Pain : No/denies pain ? ?  ? ?BMI - recorded: 22.68 ?Nutritional Status: BMI of 19-24  Normal ?Nutritional Risks: None ?Diabetes: No ? ?How often do you need to have someone help you when you read instructions, pamphlets, or other written materials from your doctor or pharmacy?: 1 - Never ? ?Diabetic? no ? ?Interpreter Needed?: No ? ?Information entered by :: Crystelle Ferrufino, LPN ? ? ?Activities of Daily Living ? ?  12/06/2021  ? 12:17 PM  ?In your present state of health, do you have any difficulty performing the following activities:  ?Hearing? 0  ?Vision? 0  ?Difficulty concentrating or making decisions? 0  ?Walking or climbing stairs? 0  ?Dressing or bathing? 0  ?Doing errands, shopping? 0  ?Preparing Food and eating ? N  ?Using the Toilet? N  ?In the past six months, have you accidently leaked urine? Y  ?Comment med helps - mild now - urge incontinence  ?Do you have problems with loss of bowel control? N  ?Managing your Medications? N  ?Managing your Finances? N  ?Housekeeping or managing your Housekeeping? N  ? ? ?Patient Care Team: ?Dettinger, Fransisca Kaufmann, MD as PCP - General  (Family Medicine) ?Mauro Kaufmann, RN as Registered Nurse ?Ellwood Dense., MD as Referring Physician (Neurology) ?Eldridge Abrahams, MD as Referring Physician (Neurology) ?Leticia Clas, OD (Optometry) ? ?[facility]

## 2021-12-06 NOTE — Patient Instructions (Signed)
[name] , ?Thank you for taking time to come for your Medicare Wellness Visit. I appreciate your ongoing commitment to your health goals. Please review the following plan we discussed and let me know if I can assist you in the future.  ? ?Screening recommendations/referrals: ?Colonoscopy: Done 2015 - no repeat required ?Mammogram: Done 07/11/2020 - declines repeat ?Bone Density: Done 12/25/2020 - Repeat every 2 years ?Recommended yearly ophthalmology/optometry visit for glaucoma screening and checkup ?Recommended yearly dental visit for hygiene and checkup ? ?Vaccinations: ?Influenza vaccine: Done 05/27/2021 - Repeat annually  ?Pneumococcal vaccine: Done 08/15/2013 & 12/18/2017 ?Tdap vaccine: Done 09/18/2021 - Repeat in 10 years ?Shingles vaccine: Done 11/19/2020 & 09/18/2021   ?Covid-19:Done 08/31/2019, 09/28/2019, 06/04/2020, & 03/04/2021 ? ?Advanced directives: Please bring a copy of your health care power of attorney and living will to the office to be added to your chart at your convenience. ? ?Conditions/risks identified: Aim for 30 minutes of exercise or brisk walking, 6-8 glasses of water, and 5 servings of fruits and vegetables each day.  ? ?Next appointment: Follow up in one year for your annual wellness visit  ? ? ?Preventive Care 80 Years and Older, Female ?Preventive care refers to lifestyle choices and visits with your health care provider that can promote health and wellness. ?What does preventive care include? ?A yearly physical exam. This is also called an annual well check. ?Dental exams once or twice a year. ?Routine eye exams. Ask your health care provider how often you should have your eyes checked. ?Personal lifestyle choices, including: ?Daily care of your teeth and gums. ?Regular physical activity. ?Eating a healthy diet. ?Avoiding tobacco and drug use. ?Limiting alcohol use. ?Practicing safe sex. ?Taking low-dose aspirin every day. ?Taking vitamin and mineral supplements as recommended by your health  care provider. ?What happens during an annual well check? ?The services and screenings done by your health care provider during your annual well check will depend on your age, overall health, lifestyle risk factors, and family history of disease. ?Counseling  ?Your health care provider may ask you questions about your: ?Alcohol use. ?Tobacco use. ?Drug use. ?Emotional well-being. ?Home and relationship well-being. ?Sexual activity. ?Eating habits. ?History of falls. ?Memory and ability to understand (cognition). ?Work and work Statistician. ?Reproductive health. ?Screening  ?You may have the following tests or measurements: ?Height, weight, and BMI. ?Blood pressure. ?Lipid and cholesterol levels. These may be checked every 5 years, or more frequently if you are over 55 years old. ?Skin check. ?Lung cancer screening. You may have this screening every year starting at age 51 if you have a 30-pack-year history of smoking and currently smoke or have quit within the past 15 years. ?Fecal occult blood test (FOBT) of the stool. You may have this test every year starting at age 6. ?Flexible sigmoidoscopy or colonoscopy. You may have a sigmoidoscopy every 5 years or a colonoscopy every 10 years starting at age 69. ?Hepatitis C blood test. ?Hepatitis B blood test. ?Sexually transmitted disease (STD) testing. ?Diabetes screening. This is done by checking your blood sugar (glucose) after you have not eaten for a while (fasting). You may have this done every 1-3 years. ?Bone density scan. This is done to screen for osteoporosis. You may have this done starting at age 1. ?Mammogram. This may be done every 1-2 years. Talk to your health care provider about how often you should have regular mammograms. ?Talk with your health care provider about your test results, treatment options, and if necessary, the  need for more tests. ?Vaccines  ?Your health care provider may recommend certain vaccines, such as: ?Influenza vaccine. This is  recommended every year. ?Tetanus, diphtheria, and acellular pertussis (Tdap, Td) vaccine. You may need a Td booster every 10 years. ?Zoster vaccine. You may need this after age 26. ?Pneumococcal 13-valent conjugate (PCV13) vaccine. One dose is recommended after age 37. ?Pneumococcal polysaccharide (PPSV23) vaccine. One dose is recommended after age 52. ?Talk to your health care provider about which screenings and vaccines you need and how often you need them. ?This information is not intended to replace advice given to you by your health care provider. Make sure you discuss any questions you have with your health care provider. ?Document Released: 08/24/2015 Document Revised: 04/16/2016 Document Reviewed: 05/29/2015 ?Elsevier Interactive Patient Education ? 2017 [place]. ? ?Fall Prevention in the Home ?Falls can cause injuries. They can happen to people of all ages. There are many things you can do to make your home safe and to help prevent falls. ?What can I do on the outside of my home? ?Regularly fix the edges of walkways and driveways and fix any cracks. ?Remove anything that might make you trip as you walk through a door, such as a raised step or threshold. ?Trim any bushes or trees on the path to your home. ?Use bright outdoor lighting. ?Clear any walking paths of anything that might make someone trip, such as rocks or tools. ?Regularly check to see if handrails are loose or broken. Make sure that both sides of any steps have handrails. ?Any raised decks and porches should have guardrails on the edges. ?Have any leaves, snow, or ice cleared regularly. ?Use sand or salt on walking paths during winter. ?Clean up any spills in your garage right away. This includes oil or grease spills. ?What can I do in the bathroom? ?Use night lights. ?Install grab bars by the toilet and in the tub and shower. Do not use towel bars as grab bars. ?Use non-skid mats or decals in the tub or shower. ?If you need to sit down in  the shower, use a plastic, non-slip stool. ?Keep the floor dry. Clean up any water that spills on the floor as soon as it happens. ?Remove soap buildup in the tub or shower regularly. ?Attach bath mats securely with double-sided non-slip rug tape. ?Do not have throw rugs and other things on the floor that can make you trip. ?What can I do in the bedroom? ?Use night lights. ?Make sure that you have a light by your bed that is easy to reach. ?Do not use any sheets or blankets that are too big for your bed. They should not hang down onto the floor. ?Have a firm chair that has side arms. You can use this for support while you get dressed. ?Do not have throw rugs and other things on the floor that can make you trip. ?What can I do in the kitchen? ?Clean up any spills right away. ?Avoid walking on wet floors. ?Keep items that you use a lot in easy-to-reach places. ?If you need to reach something above you, use a strong step stool that has a grab bar. ?Keep electrical cords out of the way. ?Do not use floor polish or wax that makes floors slippery. If you must use wax, use non-skid floor wax. ?Do not have throw rugs and other things on the floor that can make you trip. ?What can I do with my stairs? ?Do not leave any items on the  stairs. ?Make sure that there are handrails on both sides of the stairs and use them. Fix handrails that are broken or loose. Make sure that handrails are as long as the stairways. ?Check any carpeting to make sure that it is firmly attached to the stairs. Fix any carpet that is loose or worn. ?Avoid having throw rugs at the top or bottom of the stairs. If you do have throw rugs, attach them to the floor with carpet tape. ?Make sure that you have a light switch at the top of the stairs and the bottom of the stairs. If you do not have them, ask someone to add them for you. ?What else can I do to help prevent falls? ?Wear shoes that: ?Do not have high heels. ?Have rubber bottoms. ?Are comfortable  and fit you well. ?Are closed at the toe. Do not wear sandals. ?If you use a stepladder: ?Make sure that it is fully opened. Do not climb a closed stepladder. ?Make sure that both sides of the stepladder

## 2022-01-16 ENCOUNTER — Other Ambulatory Visit: Payer: Self-pay | Admitting: Family Medicine

## 2022-01-16 DIAGNOSIS — R42 Dizziness and giddiness: Secondary | ICD-10-CM

## 2022-01-16 DIAGNOSIS — H9312 Tinnitus, left ear: Secondary | ICD-10-CM

## 2022-02-12 DIAGNOSIS — L570 Actinic keratosis: Secondary | ICD-10-CM | POA: Diagnosis not present

## 2022-02-12 DIAGNOSIS — Z85828 Personal history of other malignant neoplasm of skin: Secondary | ICD-10-CM | POA: Diagnosis not present

## 2022-02-12 DIAGNOSIS — Z1283 Encounter for screening for malignant neoplasm of skin: Secondary | ICD-10-CM | POA: Diagnosis not present

## 2022-03-20 ENCOUNTER — Ambulatory Visit (INDEPENDENT_AMBULATORY_CARE_PROVIDER_SITE_OTHER): Payer: Medicare PPO | Admitting: Family Medicine

## 2022-03-20 ENCOUNTER — Encounter: Payer: Self-pay | Admitting: Family Medicine

## 2022-03-20 VITALS — BP 141/68 | HR 71 | Temp 97.4°F | Ht 62.0 in | Wt 123.0 lb

## 2022-03-20 DIAGNOSIS — I10 Essential (primary) hypertension: Secondary | ICD-10-CM

## 2022-03-20 DIAGNOSIS — R42 Dizziness and giddiness: Secondary | ICD-10-CM

## 2022-03-20 DIAGNOSIS — E7800 Pure hypercholesterolemia, unspecified: Secondary | ICD-10-CM

## 2022-03-20 DIAGNOSIS — H9312 Tinnitus, left ear: Secondary | ICD-10-CM | POA: Diagnosis not present

## 2022-03-20 DIAGNOSIS — G20 Parkinson's disease: Secondary | ICD-10-CM

## 2022-03-20 DIAGNOSIS — Z23 Encounter for immunization: Secondary | ICD-10-CM

## 2022-03-20 LAB — CBC WITH DIFFERENTIAL/PLATELET
Basophils Absolute: 0 10*3/uL (ref 0.0–0.2)
Basos: 0 %
EOS (ABSOLUTE): 0.1 10*3/uL (ref 0.0–0.4)
Eos: 1 %
Hematocrit: 36.9 % (ref 34.0–46.6)
Hemoglobin: 11.9 g/dL (ref 11.1–15.9)
Immature Grans (Abs): 0 10*3/uL (ref 0.0–0.1)
Immature Granulocytes: 0 %
Lymphocytes Absolute: 1 10*3/uL (ref 0.7–3.1)
Lymphs: 17 %
MCH: 29.9 pg (ref 26.6–33.0)
MCHC: 32.2 g/dL (ref 31.5–35.7)
MCV: 93 fL (ref 79–97)
Monocytes Absolute: 0.6 10*3/uL (ref 0.1–0.9)
Monocytes: 10 %
Neutrophils Absolute: 4.5 10*3/uL (ref 1.4–7.0)
Neutrophils: 72 %
Platelets: 168 10*3/uL (ref 150–450)
RBC: 3.98 x10E6/uL (ref 3.77–5.28)
RDW: 12.2 % (ref 11.7–15.4)
WBC: 6.2 10*3/uL (ref 3.4–10.8)

## 2022-03-20 LAB — CMP14+EGFR
ALT: 8 IU/L (ref 0–32)
AST: 21 IU/L (ref 0–40)
Albumin/Globulin Ratio: 1.8 (ref 1.2–2.2)
Albumin: 4.6 g/dL (ref 3.8–4.8)
Alkaline Phosphatase: 58 IU/L (ref 44–121)
BUN/Creatinine Ratio: 20 (ref 12–28)
BUN: 15 mg/dL (ref 8–27)
Bilirubin Total: 0.6 mg/dL (ref 0.0–1.2)
CO2: 24 mmol/L (ref 20–29)
Calcium: 10.6 mg/dL — ABNORMAL HIGH (ref 8.7–10.3)
Chloride: 103 mmol/L (ref 96–106)
Creatinine, Ser: 0.76 mg/dL (ref 0.57–1.00)
Globulin, Total: 2.5 g/dL (ref 1.5–4.5)
Glucose: 95 mg/dL (ref 70–99)
Potassium: 4.1 mmol/L (ref 3.5–5.2)
Sodium: 142 mmol/L (ref 134–144)
Total Protein: 7.1 g/dL (ref 6.0–8.5)
eGFR: 80 mL/min/{1.73_m2} (ref >59)

## 2022-03-20 LAB — LIPID PANEL
Chol/HDL Ratio: 2 ratio (ref 0.0–4.4)
Cholesterol, Total: 157 mg/dL (ref 100–199)
HDL: 77 mg/dL (ref >39)
LDL Chol Calc (NIH): 66 mg/dL (ref 0–99)
Triglycerides: 75 mg/dL (ref 0–149)
VLDL Cholesterol Cal: 14 mg/dL (ref 5–40)

## 2022-03-20 MED ORDER — MECLIZINE HCL 12.5 MG PO TABS: ORAL_TABLET | ORAL | 1 refills | Status: DC

## 2022-03-20 NOTE — Progress Notes (Signed)
BP (!) 141/68   Pulse 71   Temp (!) 97.4 F (36.3 C)   Ht '5\' 2"'  (1.575 m)   Wt 123 lb (55.8 kg)   SpO2 97%   BMI 22.50 kg/m    Subjective:   Patient ID: Tracey Juarez, female    DOB: February 21, 1941, 81 y.o.   MRN: [id number]  HPI: Tracey Juarez is a 81 y.o. female presenting on 03/20/2022 for Medical Management of Chronic Issues, Hypertension, and Hyperlipidemia   HPI Hypertension Patient is currently on lisinopril, and their blood pressure today is lisinopril. Patient denies any lightheadedness or dizziness. Patient denies headaches, blurred vision, chest pains, shortness of breath, or weakness. Denies any side effects from medication and is content with current medication.   Hyperlipidemia Patient is coming in for recheck of his hyperlipidemia. The patient is currently taking Crestor. They deny any issues with myalgias or history of liver damage from it. They deny any focal numbness or weakness or chest pain.   Patient has dizziness and balance issues and Parkinson's She did see neurology and has been stable.  Relevant past medical, surgical, family and social history reviewed and updated as indicated. Interim medical history since our last visit reviewed. Allergies and medications reviewed and updated.  Review of Systems  Constitutional:  Negative for chills and fever.  Eyes:  Negative for visual disturbance.  Respiratory:  Negative for chest tightness and shortness of breath.   Cardiovascular:  Negative for chest pain and leg swelling.  Genitourinary:  Negative for difficulty urinating and dysuria.  Musculoskeletal:  Negative for back pain and gait problem.  Skin:  Negative for rash.  Neurological:  Positive for dizziness, tremors and weakness. Negative for light-headedness and headaches.  Psychiatric/Behavioral:  Negative for agitation and behavioral problems.   All other systems reviewed and are negative.   Per HPI unless specifically indicated  above   Allergies as of 03/20/2022       Reactions   Ondansetron Swelling, Palpitations        Medication List        Accurate as of March 20, 2022  9:18 AM. If you have any questions, ask your nurse or doctor.          STOP taking these medications    anastrozole 1 MG tablet Commonly known as: ARIMIDEX Stopped by: Worthy Rancher, MD   Ginger Root 550 MG Caps Stopped by: Fransisca Kaufmann Dawnetta Copenhaver, MD   Magnesium 250 MG Tabs Stopped by: Fransisca Kaufmann Lorea Kupfer, MD       TAKE these medications    acetaminophen 325 MG tablet Commonly known as: TYLENOL Take 650 mg by mouth every 6 (six) hours as needed.   Carbidopa-Levodopa ER 25-100 MG tablet controlled release Commonly known as: SINEMET CR Take 1 tablet by mouth 3 (three) times daily.   cetirizine 10 MG tablet Commonly known as: ZYRTEC Take 10 mg by mouth daily.   clopidogrel 75 MG tablet Commonly known as: PLAVIX Take 75 mg by mouth daily.   famotidine 10 MG tablet Commonly known as: PEPCID Take 10 mg by mouth daily.   fluticasone 50 MCG/ACT nasal spray Commonly known as: FLONASE USE 1 SPRAY(S) IN EACH NOSTRIL TWICE DAILY AS NEEDED FOR ALLERGIES OR RHINITIS   lisinopril 10 MG tablet Commonly known as: ZESTRIL Take 1 tablet (10 mg total) by mouth daily.   meclizine 12.5 MG tablet Commonly known as: ANTIVERT TAKE 1 TO 2 TABLETS BY MOUTH THREE TIMES DAILY AS NEEDED  FOR DIZZINESS   multivitamin capsule Take 1 capsule by mouth daily.   oxybutynin 10 MG 24 hr tablet Commonly known as: DITROPAN-XL Take 1 tablet (10 mg total) by mouth at bedtime.   rosuvastatin 10 MG tablet Commonly known as: CRESTOR Take 1 tablet (10 mg total) by mouth daily.   Vitamin D3 1.25 MG (50000 UT) Tabs Take by mouth. Taking 4,000 ui daily         Objective:   BP (!) 141/68   Pulse 71   Temp (!) 97.4 F (36.3 C)   Ht '5\' 2"'  (1.575 m)   Wt 123 lb (55.8 kg)   SpO2 97%   BMI 22.50 kg/m   Wt Readings from Last 3  Encounters:  03/20/22 123 lb (55.8 kg)  12/06/21 124 lb (56.2 kg)  09/18/21 123 lb (55.8 kg)    Physical Exam Vitals and nursing note reviewed.  Constitutional:      General: She is not in acute distress.    Appearance: She is well-developed. She is not diaphoretic.  Eyes:     Conjunctiva/sclera: Conjunctivae normal.     Pupils: Pupils are equal, round, and reactive to light.  Cardiovascular:     Rate and Rhythm: Normal rate and regular rhythm.     Heart sounds: Normal heart sounds. No murmur heard. Pulmonary:     Effort: Pulmonary effort is normal. No respiratory distress.     Breath sounds: Normal breath sounds. No wheezing.  Musculoskeletal:        General: No tenderness. Normal range of motion.  Skin:    General: Skin is warm and dry.     Findings: No rash.  Neurological:     Mental Status: She is alert and oriented to person, place, and time.     Motor: Tremor present.  Psychiatric:        Behavior: Behavior normal.       Assessment & Plan:   Problem List Items Addressed This Visit       Cardiovascular and Mediastinum   Hypertension     Nervous and Auditory   Parkinson disease ([place])     Other   Pure hypercholesterolemia   Relevant Orders   CBC with Differential/Platelet   CMP14+EGFR   Lipid panel   Disequilibrium   Tinnitus of left ear   Relevant Medications   meclizine (ANTIVERT) 12.5 MG tablet   Other Visit Diagnoses     Essential hypertension    -  Primary   Relevant Orders   CBC with Differential/Platelet   CMP14+EGFR   Lipid panel   Postural dizziness       Relevant Medications   meclizine (ANTIVERT) 12.5 MG tablet   Need for shingles vaccine       Relevant Orders   Zoster Recombinant (Shingrix ) (Completed)       Continue current medicine, no changes, seems to be doing well, will check blood work. Follow up plan: Return in about 6 months (around 09/20/2022), or if symptoms worsen or fail to improve, for Hyperlipidemia and  hypertension.  Counseling provided for all of the vaccine components Orders Placed This Encounter  Procedures   Zoster Recombinant (Shingrix )   CBC with Differential/Platelet   CMP14+EGFR   Lipid panel    Caryl Pina, MD [facility] 03/20/2022, 9:18 AM

## 2022-04-03 DIAGNOSIS — M79676 Pain in unspecified toe(s): Secondary | ICD-10-CM | POA: Diagnosis not present

## 2022-04-03 DIAGNOSIS — B351 Tinea unguium: Secondary | ICD-10-CM | POA: Diagnosis not present

## 2022-04-03 DIAGNOSIS — I70203 Unspecified atherosclerosis of native arteries of extremities, bilateral legs: Secondary | ICD-10-CM | POA: Diagnosis not present

## 2022-04-03 DIAGNOSIS — L84 Corns and callosities: Secondary | ICD-10-CM | POA: Diagnosis not present

## 2022-04-23 DIAGNOSIS — Z4542 Encounter for adjustment and management of neuropacemaker (brain) (peripheral nerve) (spinal cord): Secondary | ICD-10-CM | POA: Diagnosis not present

## 2022-04-23 DIAGNOSIS — R413 Other amnesia: Secondary | ICD-10-CM | POA: Diagnosis not present

## 2022-04-23 DIAGNOSIS — M1990 Unspecified osteoarthritis, unspecified site: Secondary | ICD-10-CM | POA: Diagnosis not present

## 2022-04-23 DIAGNOSIS — G20 Parkinson's disease: Secondary | ICD-10-CM | POA: Diagnosis not present

## 2022-04-23 DIAGNOSIS — Z9682 Presence of neurostimulator: Secondary | ICD-10-CM | POA: Diagnosis not present

## 2022-04-23 DIAGNOSIS — Z79899 Other long term (current) drug therapy: Secondary | ICD-10-CM | POA: Diagnosis not present

## 2022-09-08 DIAGNOSIS — N3001 Acute cystitis with hematuria: Secondary | ICD-10-CM | POA: Diagnosis not present

## 2022-09-08 DIAGNOSIS — K5900 Constipation, unspecified: Secondary | ICD-10-CM | POA: Diagnosis not present

## 2022-09-08 DIAGNOSIS — R531 Weakness: Secondary | ICD-10-CM | POA: Diagnosis not present

## 2022-09-15 ENCOUNTER — Encounter: Payer: Self-pay | Admitting: Family Medicine

## 2022-09-15 ENCOUNTER — Ambulatory Visit (INDEPENDENT_AMBULATORY_CARE_PROVIDER_SITE_OTHER): Payer: Medicare PPO | Admitting: Family Medicine

## 2022-09-15 VITALS — BP 154/69 | HR 78 | Ht 62.0 in | Wt 117.0 lb

## 2022-09-15 DIAGNOSIS — N3000 Acute cystitis without hematuria: Secondary | ICD-10-CM | POA: Diagnosis not present

## 2022-09-15 DIAGNOSIS — R350 Frequency of micturition: Secondary | ICD-10-CM

## 2022-09-15 LAB — URINALYSIS
Bilirubin, UA: NEGATIVE
Glucose, UA: NEGATIVE
Ketones, UA: NEGATIVE
Leukocytes,UA: NEGATIVE
Nitrite, UA: NEGATIVE
Protein,UA: NEGATIVE
RBC, UA: NEGATIVE
Specific Gravity, UA: 1.015 (ref 1.005–1.030)
Urobilinogen, Ur: 0.2 mg/dL (ref 0.2–1.0)
pH, UA: 7 (ref 5.0–7.5)

## 2022-09-15 MED ORDER — SULFAMETHOXAZOLE-TRIMETHOPRIM 800-160 MG PO TABS: 1.0000 | ORAL_TABLET | Freq: Two times a day (BID) | ORAL | 0 refills | Status: DC

## 2022-09-15 NOTE — Progress Notes (Signed)
BP (!) 154/69   Pulse 78   Ht '5\' 2"'$  (1.575 m)   Wt 117 lb (53.1 kg)   SpO2 98%   BMI 21.40 kg/m    Subjective:   Patient ID: Tracey Juarez, female    DOB: 1941-03-08, 82 y.o.   MRN: [id number]  HPI: Tracey Juarez is a 82 y.o. female presenting on 09/15/2022 for Hospitalization Follow-up   HPI Urinary frequency and UTI follow-up. Patient was in the emergency department on 09/08/2022.  She says she is having problems with her stool and that took her to the emergency department and then was found to have a UTI there.  She had taken some laxatives and gave her diarrhea afterwards and she is having issues with off and on.  She has 1 dose left of the antibiotic today, and she will have finished.  Relevant past medical, surgical, family and social history reviewed and updated as indicated. Interim medical history since our last visit reviewed. Allergies and medications reviewed and updated.  Review of Systems  Constitutional:  Negative for chills and fever.  Eyes:  Negative for visual disturbance.  Respiratory:  Negative for chest tightness and shortness of breath.   Cardiovascular:  Negative for chest pain and leg swelling.  Gastrointestinal:  Positive for constipation. Negative for abdominal pain, diarrhea, nausea and vomiting.  Genitourinary:  Positive for frequency and urgency. Negative for difficulty urinating, dysuria, hematuria, pelvic pain, vaginal bleeding, vaginal discharge and vaginal pain.  Musculoskeletal:  Negative for back pain and gait problem.  Skin:  Negative for rash.  Neurological:  Negative for light-headedness and headaches.  Psychiatric/Behavioral:  Negative for agitation and behavioral problems.   All other systems reviewed and are negative.   Per HPI unless specifically indicated above   Allergies as of 09/15/2022       Reactions   Cat Hair Extract    Ondansetron Swelling, Palpitations        Medication List        Accurate as of  September 15, 2022  8:47 AM. If you have any questions, ask your nurse or doctor.          acetaminophen 325 MG tablet Commonly known as: TYLENOL Take 650 mg by mouth every 6 (six) hours as needed.   Carbidopa-Levodopa ER 25-100 MG tablet controlled release Commonly known as: SINEMET CR Take 1 tablet by mouth 3 (three) times daily.   cetirizine 10 MG tablet Commonly known as: ZYRTEC Take 10 mg by mouth daily.   clopidogrel 75 MG tablet Commonly known as: PLAVIX Take 75 mg by mouth daily.   famotidine 10 MG tablet Commonly known as: PEPCID Take 10 mg by mouth daily.   fluticasone 50 MCG/ACT nasal spray Commonly known as: FLONASE USE 1 SPRAY(S) IN EACH NOSTRIL TWICE DAILY AS NEEDED FOR ALLERGIES OR RHINITIS   lisinopril 10 MG tablet Commonly known as: ZESTRIL Take 1 tablet (10 mg total) by mouth daily.   meclizine 12.5 MG tablet Commonly known as: ANTIVERT TAKE 1 TO 2 TABLETS BY MOUTH THREE TIMES DAILY AS NEEDED FOR DIZZINESS   multivitamin capsule Take 1 capsule by mouth daily.   oxybutynin 10 MG 24 hr tablet Commonly known as: DITROPAN-XL Take 1 tablet (10 mg total) by mouth at bedtime.   rosuvastatin 10 MG tablet Commonly known as: CRESTOR Take 1 tablet (10 mg total) by mouth daily.   sulfamethoxazole-trimethoprim 800-160 MG tablet Commonly known as: BACTRIM DS Take 1 tablet by mouth 2 (two)  times daily. Started by: Worthy Rancher, MD   Vitamin D3 1.25 MG (50000 UT) Tabs Take by mouth. Taking 4,000 ui daily         Objective:   BP (!) 154/69   Pulse 78   Ht '5\' 2"'$  (1.575 m)   Wt 117 lb (53.1 kg)   SpO2 98%   BMI 21.40 kg/m   Wt Readings from Last 3 Encounters:  09/15/22 117 lb (53.1 kg)  03/20/22 123 lb (55.8 kg)  12/06/21 124 lb (56.2 kg)    Physical Exam Vitals and nursing note reviewed.  Constitutional:      General: She is not in acute distress.    Appearance: She is well-developed. She is not diaphoretic.  Eyes:      Conjunctiva/sclera: Conjunctivae normal.  Cardiovascular:     Rate and Rhythm: Normal rate and regular rhythm.     Heart sounds: Normal heart sounds. No murmur heard. Pulmonary:     Effort: Pulmonary effort is normal. No respiratory distress.     Breath sounds: Normal breath sounds. No wheezing.  Abdominal:     General: Abdomen is flat. Bowel sounds are normal. There is no distension.     Tenderness: There is no abdominal tenderness. There is no right CVA tenderness, left CVA tenderness, guarding or rebound.  Musculoskeletal:        General: No tenderness. Normal range of motion.  Skin:    General: Skin is warm and dry.     Findings: No rash.  Neurological:     Mental Status: She is alert and oriented to person, place, and time.     Coordination: Coordination normal.  Psychiatric:        Behavior: Behavior normal.       Assessment & Plan:   Problem List Items Addressed This Visit   None Visit Diagnoses     Urinary frequency    -  Primary   Relevant Medications   sulfamethoxazole-trimethoprim (BACTRIM DS) 800-160 MG tablet   Other Relevant Orders   Urine Culture   Urinalysis   Acute cystitis without hematuria       Relevant Medications   sulfamethoxazole-trimethoprim (BACTRIM DS) 800-160 MG tablet       Patient's urinalysis was clear but she still on the antibiotic, will await culture but because she still having significant symptoms and with her age we will go ahead and treat symptomatically.  Will give a different antibiotic than what she had  Recommended docusate for stool softener and MiraLAX as needed. Follow up plan: Return if symptoms worsen or fail to improve.  Counseling provided for all of the vaccine components Orders Placed This Encounter  Procedures   Urine Culture   Urinalysis    Caryl Pina, MD [facility] 09/15/2022, 8:47 AM

## 2022-09-16 LAB — URINE CULTURE

## 2022-09-22 ENCOUNTER — Ambulatory Visit (INDEPENDENT_AMBULATORY_CARE_PROVIDER_SITE_OTHER): Payer: Medicare PPO | Admitting: Family Medicine

## 2022-09-22 ENCOUNTER — Encounter: Payer: Self-pay | Admitting: Family Medicine

## 2022-09-22 VITALS — BP 107/61 | HR 6 | Ht 62.0 in | Wt 115.0 lb

## 2022-09-22 DIAGNOSIS — I10 Essential (primary) hypertension: Secondary | ICD-10-CM

## 2022-09-22 DIAGNOSIS — E7800 Pure hypercholesterolemia, unspecified: Secondary | ICD-10-CM | POA: Diagnosis not present

## 2022-09-22 DIAGNOSIS — G20A2 Parkinson's disease without dyskinesia, with fluctuations: Secondary | ICD-10-CM

## 2022-09-22 DIAGNOSIS — N3281 Overactive bladder: Secondary | ICD-10-CM

## 2022-09-22 MED ORDER — ROSUVASTATIN CALCIUM 10 MG PO TABS: 10.0000 mg | ORAL_TABLET | Freq: Every day | ORAL | 3 refills | Status: DC

## 2022-09-22 MED ORDER — OXYBUTYNIN CHLORIDE ER 10 MG PO TB24: 10.0000 mg | ORAL_TABLET | Freq: Every day | ORAL | 3 refills | Status: DC

## 2022-09-22 MED ORDER — LISINOPRIL 5 MG PO TABS: 5.0000 mg | ORAL_TABLET | Freq: Every day | ORAL | 3 refills | Status: DC

## 2022-09-22 NOTE — Progress Notes (Signed)
BP 107/61   Pulse (!) 6   Ht 5' 2"$  (1.575 m)   Wt 115 lb (52.2 kg)   SpO2 98%   BMI 21.03 kg/m    Subjective:   Patient ID: Tracey Juarez, female    DOB: October 24, 1940, 82 y.o.   MRN: [id number]  HPI: Tracey Juarez is a 82 y.o. female presenting on 09/22/2022 for Medical Management of Chronic Issues, Hyperlipidemia, and Hypertension   HPI Hypertension Patient is currently on lisinopril, and their blood pressure today is 107/61. Patient denies any lightheadedness or dizziness. Patient denies headaches, blurred vision, chest pains, shortness of breath, or weakness. Denies any side effects from medication and is content with current medication.   Hyperlipidemia Patient is coming in for recheck of his hyperlipidemia. The patient is currently taking Crestor. They deny any issues with myalgias or history of liver damage from it. They deny any focal numbness or weakness or chest pain.   OAB with detrusor instability recheck Patient is currently taking oxybutynin for this.  She states it is not really helping and she tried Myrbetriq changes did not really help.  We did discuss possibly going to see a neurologist she wants to hold off for now but we discussed that being as an option.  She says she has to go multiple times a day and that is why she does not drink as much fluid and then sometimes gets a little dehydrated.  Parkinson's disease recheck Patient sees neurology for this and they help manage.  She is currently on carbidopa levodopa  Relevant past medical, surgical, family and social history reviewed and updated as indicated. Interim medical history since our last visit reviewed. Allergies and medications reviewed and updated.  Review of Systems  Constitutional:  Negative for chills and fever.  HENT:  Negative for congestion, ear discharge and ear pain.   Eyes:  Negative for redness and visual disturbance.  Respiratory:  Negative for chest tightness and shortness of  breath.   Cardiovascular:  Negative for chest pain and leg swelling.  Genitourinary:  Positive for frequency and urgency. Negative for decreased urine volume, difficulty urinating and dysuria.  Musculoskeletal:  Negative for back pain and gait problem.  Skin:  Negative for rash.  Neurological:  Negative for light-headedness and headaches.  Psychiatric/Behavioral:  Negative for agitation and behavioral problems.   All other systems reviewed and are negative.   Per HPI unless specifically indicated above   Allergies as of 09/22/2022       Reactions   Cat Hair Extract    Ondansetron Swelling, Palpitations        Medication List        Accurate as of September 22, 2022 10:59 AM. If you have any questions, ask your nurse or doctor.          acetaminophen 325 MG tablet Commonly known as: TYLENOL Take 650 mg by mouth every 6 (six) hours as needed.   Carbidopa-Levodopa ER 25-100 MG tablet controlled release Commonly known as: SINEMET CR Take 1 tablet by mouth 3 (three) times daily.   cetirizine 10 MG tablet Commonly known as: ZYRTEC Take 10 mg by mouth daily.   clopidogrel 75 MG tablet Commonly known as: PLAVIX Take 75 mg by mouth daily.   famotidine 10 MG tablet Commonly known as: PEPCID Take 10 mg by mouth daily.   fluticasone 50 MCG/ACT nasal spray Commonly known as: FLONASE USE 1 SPRAY(S) IN EACH NOSTRIL TWICE DAILY AS NEEDED FOR ALLERGIES OR  RHINITIS   lisinopril 5 MG tablet Commonly known as: ZESTRIL Take 1 tablet (5 mg total) by mouth daily. What changed:  medication strength how much to take Changed by: Fransisca Kaufmann Sharanya Templin, MD   meclizine 12.5 MG tablet Commonly known as: ANTIVERT TAKE 1 TO 2 TABLETS BY MOUTH THREE TIMES DAILY AS NEEDED FOR DIZZINESS   multivitamin capsule Take 1 capsule by mouth daily.   oxybutynin 10 MG 24 hr tablet Commonly known as: DITROPAN-XL Take 1 tablet (10 mg total) by mouth at bedtime.   rosuvastatin 10 MG  tablet Commonly known as: CRESTOR Take 1 tablet (10 mg total) by mouth daily.   sulfamethoxazole-trimethoprim 800-160 MG tablet Commonly known as: BACTRIM DS Take 1 tablet by mouth 2 (two) times daily.   Vitamin D3 1.25 MG (50000 UT) Tabs Take by mouth. Taking 4,000 ui daily         Objective:   BP 107/61   Pulse (!) 6   Ht 5' 2"$  (1.575 m)   Wt 115 lb (52.2 kg)   SpO2 98%   BMI 21.03 kg/m   Wt Readings from Last 3 Encounters:  09/22/22 115 lb (52.2 kg)  09/15/22 117 lb (53.1 kg)  03/20/22 123 lb (55.8 kg)    Physical Exam Vitals and nursing note reviewed.  Constitutional:      General: She is not in acute distress.    Appearance: She is well-developed. She is not diaphoretic.  Eyes:     Conjunctiva/sclera: Conjunctivae normal.  Cardiovascular:     Rate and Rhythm: Normal rate and regular rhythm.     Heart sounds: Normal heart sounds. No murmur heard. Pulmonary:     Effort: Pulmonary effort is normal. No respiratory distress.     Breath sounds: Normal breath sounds. No wheezing.  Musculoskeletal:        General: No swelling or tenderness. Normal range of motion.  Skin:    General: Skin is warm and dry.     Findings: No rash.  Neurological:     Mental Status: She is alert and oriented to person, place, and time.     Coordination: Coordination normal.  Psychiatric:        Behavior: Behavior normal.       Assessment & Plan:   Problem List Items Addressed This Visit       Cardiovascular and Mediastinum   Hypertension - Primary   Relevant Medications   lisinopril (ZESTRIL) 5 MG tablet   rosuvastatin (CRESTOR) 10 MG tablet   Other Relevant Orders   CBC with Differential/Platelet   CMP14+EGFR   Lipid panel     Nervous and Auditory   Parkinson disease ([place])   Relevant Orders   CBC with Differential/Platelet   CMP14+EGFR   Lipid panel     Genitourinary   Detrusor instability   Relevant Medications   oxybutynin (DITROPAN-XL) 10 MG 24 hr tablet    Other Relevant Orders   CBC with Differential/Platelet   CMP14+EGFR   Lipid panel     Other   Pure hypercholesterolemia   Relevant Medications   lisinopril (ZESTRIL) 5 MG tablet   rosuvastatin (CRESTOR) 10 MG tablet   Other Relevant Orders   CBC with Differential/Platelet   CMP14+EGFR   Lipid panel    Recommended she start cutting her lisinopril in half and then try and stay as hydrated as she can.  Obviously her urinary frequency is an issue with that but she has not seen much benefit from [facility] or  Ditropan.  Discussed seeing urology but she wants to hold off for now we will discuss in the future.   Follow up plan: Return in about 6 months (around 03/23/2023), or if symptoms worsen or fail to improve, for Hypertension hyperlipidemia.  Counseling provided for all of the vaccine components Orders Placed This Encounter  Procedures   CBC with Differential/Platelet   CMP14+EGFR   Lipid panel    Caryl Pina, MD [facility] 09/22/2022, 10:59 AM

## 2022-09-23 LAB — CBC WITH DIFFERENTIAL/PLATELET
Basophils Absolute: 0.1 10*3/uL (ref 0.0–0.2)
Basos: 1 %
EOS (ABSOLUTE): 0 10*3/uL (ref 0.0–0.4)
Eos: 1 %
Hematocrit: 39.2 % (ref 34.0–46.6)
Hemoglobin: 12.5 g/dL (ref 11.1–15.9)
Immature Grans (Abs): 0 10*3/uL (ref 0.0–0.1)
Immature Granulocytes: 0 %
Lymphocytes Absolute: 1.2 10*3/uL (ref 0.7–3.1)
Lymphs: 22 %
MCH: 30.7 pg (ref 26.6–33.0)
MCHC: 31.9 g/dL (ref 31.5–35.7)
MCV: 96 fL (ref 79–97)
Monocytes Absolute: 0.5 10*3/uL (ref 0.1–0.9)
Monocytes: 9 %
Neutrophils Absolute: 3.7 10*3/uL (ref 1.4–7.0)
Neutrophils: 67 %
Platelets: 268 10*3/uL (ref 150–450)
RBC: 4.07 x10E6/uL (ref 3.77–5.28)
RDW: 12.9 % (ref 11.7–15.4)
WBC: 5.6 10*3/uL (ref 3.4–10.8)

## 2022-09-23 LAB — CMP14+EGFR
ALT: 8 IU/L (ref 0–32)
AST: 21 IU/L (ref 0–40)
Albumin/Globulin Ratio: 2.1 (ref 1.2–2.2)
Albumin: 4.6 g/dL (ref 3.8–4.8)
Alkaline Phosphatase: 84 IU/L (ref 44–121)
BUN/Creatinine Ratio: 18 (ref 12–28)
BUN: 20 mg/dL (ref 8–27)
Bilirubin Total: 0.4 mg/dL (ref 0.0–1.2)
CO2: 22 mmol/L (ref 20–29)
Calcium: 9.8 mg/dL (ref 8.7–10.3)
Chloride: 98 mmol/L (ref 96–106)
Creatinine, Ser: 1.13 mg/dL — ABNORMAL HIGH (ref 0.57–1.00)
Globulin, Total: 2.2 g/dL (ref 1.5–4.5)
Glucose: 88 mg/dL (ref 70–99)
Potassium: 5.5 mmol/L — ABNORMAL HIGH (ref 3.5–5.2)
Sodium: 136 mmol/L (ref 134–144)
Total Protein: 6.8 g/dL (ref 6.0–8.5)
eGFR: 49 mL/min/{1.73_m2} — ABNORMAL LOW (ref >59)

## 2022-09-23 LAB — LIPID PANEL
Chol/HDL Ratio: 2.4 ratio (ref 0.0–4.4)
Cholesterol, Total: 152 mg/dL (ref 100–199)
HDL: 63 mg/dL (ref >39)
LDL Chol Calc (NIH): 72 mg/dL (ref 0–99)
Triglycerides: 94 mg/dL (ref 0–149)
VLDL Cholesterol Cal: 17 mg/dL (ref 5–40)

## 2022-11-05 DIAGNOSIS — Z85828 Personal history of other malignant neoplasm of skin: Secondary | ICD-10-CM | POA: Diagnosis not present

## 2022-11-05 DIAGNOSIS — D239 Other benign neoplasm of skin, unspecified: Secondary | ICD-10-CM | POA: Diagnosis not present

## 2022-11-05 DIAGNOSIS — Z1283 Encounter for screening for malignant neoplasm of skin: Secondary | ICD-10-CM | POA: Diagnosis not present

## 2022-11-05 DIAGNOSIS — L570 Actinic keratosis: Secondary | ICD-10-CM | POA: Diagnosis not present

## 2022-11-05 DIAGNOSIS — Z85820 Personal history of malignant melanoma of skin: Secondary | ICD-10-CM | POA: Diagnosis not present

## 2022-12-01 DIAGNOSIS — G20A1 Parkinson's disease without dyskinesia, without mention of fluctuations: Secondary | ICD-10-CM | POA: Diagnosis not present

## 2022-12-01 DIAGNOSIS — I6782 Cerebral ischemia: Secondary | ICD-10-CM | POA: Diagnosis not present

## 2022-12-10 ENCOUNTER — Ambulatory Visit (INDEPENDENT_AMBULATORY_CARE_PROVIDER_SITE_OTHER): Payer: Medicare PPO

## 2022-12-10 VITALS — Ht 63.0 in | Wt 118.0 lb

## 2022-12-10 DIAGNOSIS — Z0000 Encounter for general adult medical examination without abnormal findings: Secondary | ICD-10-CM

## 2022-12-10 DIAGNOSIS — Z780 Asymptomatic menopausal state: Secondary | ICD-10-CM

## 2022-12-10 NOTE — Progress Notes (Signed)
Subjective:   Tracey Juarez is a 82 y.o. female who presents for Medicare Annual (Subsequent) preventive examination. I connected with  [name] on 12/10/22 by a audio enabled telemedicine application and verified that I am speaking with the correct person using two identifiers.  Patient Location: Home  Provider Location: Home Office  I discussed the limitations of evaluation and management by telemedicine. The patient expressed understanding and agreed to proceed.  Review of Systems     Cardiac Risk Factors include: advanced age (>40men, >33 women);dyslipidemia;hypertension     Objective:    Today's Vitals   12/10/22 1207  Weight: 118 lb (53.5 kg)  Height: 5\' 3"  (1.6 m)   Body mass index is 20.9 kg/m.     12/10/2022   12:10 PM 12/06/2021   12:09 PM 05/13/2021   10:21 AM 02/12/2021   10:24 AM 11/30/2020   10:44 AM 11/24/2019   10:57 AM 05/06/2017   11:31 AM  Advanced Directives  Does Patient Have a Medical Advance Directive? No Yes Yes Yes No;Yes Yes Yes  Type of Special educational needs teacher of [place];Living will  Healthcare Power of [place];Living will Healthcare Power of [place];Living will Living will;Healthcare Power of Attorney   Does patient want to make changes to medical advance directive?     No - Patient declined No - Patient declined   Copy of Healthcare Power of Attorney in Chart?  No - copy requested    No - copy requested   Would patient like information on creating a medical advance directive? No - Patient declined    No - Patient declined      Current Medications (verified) Outpatient Encounter Medications as of 12/10/2022  Medication Sig   acetaminophen (TYLENOL) 325 MG tablet Take 650 mg by mouth every 6 (six) hours as needed.   Carbidopa-Levodopa ER (SINEMET CR) 25-100 MG tablet controlled release Take 1 tablet by mouth 3 (three) times daily.   cetirizine (ZYRTEC) 10 MG tablet Take 10 mg by mouth daily.   Cholecalciferol  (VITAMIN D3) 1.25 MG (50000 UT) TABS Take by mouth. Taking 4,000 ui daily   clopidogrel (PLAVIX) 75 MG tablet Take 75 mg by mouth daily.   famotidine (PEPCID) 10 MG tablet Take 10 mg by mouth daily.   fluticasone (FLONASE) 50 MCG/ACT nasal spray USE 1 SPRAY(S) IN EACH NOSTRIL TWICE DAILY AS NEEDED FOR ALLERGIES OR RHINITIS   lisinopril (ZESTRIL) 5 MG tablet Take 1 tablet (5 mg total) by mouth daily.   meclizine (ANTIVERT) 12.5 MG tablet TAKE 1 TO 2 TABLETS BY MOUTH THREE TIMES DAILY AS NEEDED FOR DIZZINESS   Multiple Vitamin (MULTIVITAMIN) capsule Take 1 capsule by mouth daily.   oxybutynin (DITROPAN-XL) 10 MG 24 hr tablet Take 1 tablet (10 mg total) by mouth at bedtime.   rosuvastatin (CRESTOR) 10 MG tablet Take 1 tablet (10 mg total) by mouth daily.   sulfamethoxazole-trimethoprim (BACTRIM DS) 800-160 MG tablet Take 1 tablet by mouth 2 (two) times daily. (Patient not taking: Reported on 12/10/2022)   No facility-administered encounter medications on file as of 12/10/2022.    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   Detrusor instability    Elevated cholesterol    GERD (gastroesophageal reflux disease)    Hot flashes  Humerus head fracture, left, with routine healing, subsequent encounter 2000   Hypertension    Melanoma (HCC)    Mixed basal-squamous cell carcinoma    skin   Parkinson disease    Personal history of radiation therapy    Stroke (HCC)    on Plavix, no deficits   Past Surgical History:  Procedure Laterality Date   BRAIN SURGERY     BREAST LUMPECTOMY Left 04/2015   radiation   BREAST LUMPECTOMY WITH RADIOACTIVE SEED AND SENTINEL LYMPH NODE BIOPSY Left 04/25/2015   Procedure: LEFT BREAST PARTIAL MASTECTOMY WITH RADIOACTIVE SEED AND SENTINEL LYMPH NODE MAPPING;  Surgeon:  Harriette Bouillon, MD;  Location: [facility];  Service: General;  Laterality: Left;   FOOT SURGERY  2004   KNEE SURGERY  2007   skin cancer excised     TONSILLECTOMY     Family History  Problem Relation Age of Onset   Hypertension Mother    Heart attack Mother    Heart attack Maternal Aunt    Heart attack Maternal Uncle    Breast cancer Neg Hx    Social History   Socioeconomic History   Marital status: Married    Spouse name: Jake Shark    Number of children: 0   Years of education: ged   Highest education level: GED or equivalent  Occupational History   Occupation: Retired  Tobacco Use   Smoking status: Never   Smokeless tobacco: Never  Vaping Use   Vaping Use: Never used  Substance and Sexual Activity   Alcohol use: No    Alcohol/week: 0.0 standard drinks of alcohol   Drug use: No   Sexual activity: Never    Birth control/protection: Post-menopausal    Comment: 1st intercourse 16 yo-2 partners  Other Topics Concern   Not on file  Social History Narrative   Not on file   Social Determinants of Health   Financial Resource Strain: Low Risk  (12/10/2022)   Overall Financial Resource Strain (CARDIA)    Difficulty of Paying Living Expenses: Not hard at all  Food Insecurity: No Food Insecurity (12/10/2022)   Hunger Vital Sign    Worried About Running Out of Food in the Last Year: Never true    Ran Out of Food in the Last Year: Never true  Transportation Needs: No Transportation Needs (12/10/2022)   PRAPARE - Administrator, Civil Service (Medical): No    Lack of Transportation (Non-Medical): No  Physical Activity: Sufficiently Active (12/10/2022)   Exercise Vital Sign    Days of Exercise per Week: 5 days    Minutes of Exercise per Session: 30 min  Stress: No Stress Concern Present (12/10/2022)   [employer] of Occupational Health - Occupational Stress Questionnaire    Feeling of Stress : Not at all  Social Connections: Moderately Integrated  (12/10/2022)   Social Connection and Isolation Panel [NHANES]    Frequency of Communication with Friends and Family: More than three times a week    Frequency of Social Gatherings with Friends and Family: More than three times a week    Attends Religious Services: More than 4 times per year    Active Member of [employer] or Organizations: No    Attends Banker Meetings: Never    Marital Status: Married    Tobacco Counseling Counseling given: Not Answered   Clinical Intake:  Pre-visit preparation completed: Yes  Pain : No/denies pain     Nutritional Risks: None Diabetes: No  How often do you need to have someone help you when you read instructions, pamphlets, or other written materials from your doctor or pharmacy?: 1 - Never  Diabetic?no   Interpreter Needed?: No  Information entered by :: Renie Ora, LPN   Activities of Daily Living    12/10/2022   12:11 PM  In your present state of health, do you have any difficulty performing the following activities:  Hearing? 0  Vision? 0  Difficulty concentrating or making decisions? 0  Walking or climbing stairs? 0  Dressing or bathing? 0  Doing errands, shopping? 0  Preparing Food and eating ? N  Using the Toilet? N  In the past six months, have you accidently leaked urine? N  Do you have problems with loss of bowel control? N  Managing your Medications? N  Managing your Finances? N  Housekeeping or managing your Housekeeping? N    Patient Care Team: Dettinger, Elige Radon, MD as PCP - General (Family Medicine) Pershing Proud, RN as Registered Nurse Adalberto Cole., MD as Referring Physician (Neurology) Jaquita Folds, MD as Referring Physician (Neurology) Desiree Lucy, OD (Optometry)  Indicate any recent Medical Services you may have received from other than [facility] providers in the past year (date may be approximate).     Assessment:   This is a routine wellness examination for [place].  Hearing/Vision  screen Vision Screening - Comments:: Wears rx glasses - up to date with routine eye exams with  Dr.Davis   Dietary issues and exercise activities discussed: Current Exercise Habits: Home exercise routine, Type of exercise: walking, Time (Minutes): 30, Frequency (Times/Week): 5, Weekly Exercise (Minutes/Week): 150, Intensity: Mild, Exercise limited by: None identified   Goals Addressed             This Visit's Progress    Prevent falls   On track      Depression Screen    12/10/2022   12:10 PM 09/22/2022   10:43 AM 09/15/2022    8:22 AM 09/15/2022    7:56 AM 03/20/2022    8:54 AM 12/06/2021   12:08 PM 09/18/2021   10:53 AM  PHQ 2/9 Scores  PHQ - 2 Score 0 0 0 0 0 0 0  PHQ- 9 Score  3  0 2  2    Fall Risk    12/10/2022   12:08 PM 09/22/2022   10:42 AM 09/15/2022    8:22 AM 03/20/2022    8:54 AM 12/06/2021   12:02 PM  Fall Risk   Falls in the past year? 0 1 1 0 1  Number falls in past yr: 0 1 1  0  Injury with Fall? 0 0 0  1  Risk for fall due to : No Fall Risks Impaired balance/gait Impaired balance/gait  Impaired balance/gait;Orthopedic patient  Follow up Falls prevention discussed Falls evaluation completed Falls evaluation completed  Education provided;Falls prevention discussed    FALL RISK PREVENTION PERTAINING TO THE HOME:  Any stairs in or around the home? Yes  If so, are there any without handrails? No  Home free of loose throw rugs in walkways, pet beds, electrical cords, etc? Yes  Adequate lighting in your home to reduce risk of falls? Yes   ASSISTIVE DEVICES UTILIZED TO PREVENT FALLS:  Life alert? No  Use of a cane, walker or w/c? No  Grab bars in the bathroom? Yes  Shower chair or bench in shower? No  Elevated toilet seat or a handicapped toilet? No  12/10/2022   12:11 PM 12/06/2021   12:09 PM 11/30/2020   10:44 AM 11/24/2019   11:00 AM  6CIT Screen  What Year? 0 points 0 points 0 points 0 points  What month? 0 points 0 points 0 points 0 points  What  time? 0 points 0 points 0 points 0 points  Count back from 20 0 points 0 points 0 points 0 points  Months in reverse 0 points 0 points 0 points 0 points  Repeat phrase 0 points 0 points 0 points 0 points  Total Score 0 points 0 points 0 points 0 points    Immunizations Immunization History  Administered Date(s) Administered   Influenza, High Dose Seasonal PF 05/09/2013, 05/08/2014, 05/20/2017, 05/26/2018, 05/03/2019, 05/27/2021   Influenza,inj,Quad PF,6+ Mos 05/15/2015, 06/04/2022, 06/04/2022   Influenza-Unspecified 05/15/2015, 05/07/2016, 05/20/2017, 05/03/2019   Moderna SARS-COV2 Booster Vaccination 03/04/2021   Moderna Sars-Covid-2 Vaccination 08/31/2019, 09/28/2019, 06/04/2020   Pneumococcal Conjugate-13 08/15/2013   Pneumococcal Polysaccharide-23 12/18/2017   Tdap 06/02/2011, 09/18/2021   Zoster Recombinat (Shingrix) 11/19/2020, 09/18/2021    TDAP status: Up to date  Flu Vaccine status: Up to date  Pneumococcal vaccine status: Up to date  Covid-19 vaccine status: Completed vaccines  Qualifies for Shingles Vaccine? Yes   Zostavax completed Yes   Shingrix Completed?: Yes  Screening Tests Health Maintenance  Topic Date Due   COVID-19 Vaccine (7 - 2023-24 season) 04/11/2022   MAMMOGRAM  03/21/2023 (Originally 07/11/2021)   DEXA SCAN  12/26/2022   INFLUENZA VACCINE  03/12/2023   Medicare Annual Wellness (AWV)  12/10/2023   DTaP/Tdap/Td (3 - Td or Tdap) 09/19/2031   Pneumonia Vaccine 39+ Years old  Completed   Zoster Vaccines- Shingrix  Completed   HPV VACCINES  Aged Out   PAP SMEAR-Modifier  Discontinued    Health Maintenance  Health Maintenance Due  Topic Date Due   COVID-19 Vaccine (7 - 2023-24 season) 04/11/2022    Colorectal cancer screening: No longer required.   Mammogram status: No longer required due to age.  Bone Density status: Ordered 12/10/2022. Pt provided with contact info and advised to call to schedule appt.  Lung Cancer Screening: (Low Dose  CT Chest recommended if Age 9-80 years, 30 pack-year currently smoking OR have quit w/in 15years.) does not qualify.   Lung Cancer Screening Referral: n/a  Additional Screening:  Hepatitis C Screening: does not qualify;   Vision Screening: Recommended annual ophthalmology exams for early detection of glaucoma and other disorders of the eye. Is the patient up to date with their annual eye exam?  Yes  Who is the provider or what is the name of the office in which the patient attends annual eye exams? Dr.Davis  If pt is not established with a provider, would they like to be referred to a provider to establish care? No .   Dental Screening: Recommended annual dental exams for proper oral hygiene  Community Resource Referral / Chronic Care Management: CRR required this visit?  No   CCM required this visit?  No      Plan:     I have personally reviewed and noted the following in the patient's chart:   Medical and social history Use of alcohol, tobacco or illicit drugs  Current medications and supplements including opioid prescriptions. Patient is not currently taking opioid prescriptions. Functional ability and status Nutritional status Physical activity Advanced directives List of other physicians Hospitalizations, surgeries, and ER visits in previous 12 months Vitals Screenings to include cognitive, depression, and falls  Referrals and appointments  In addition, I have reviewed and discussed with patient certain preventive protocols, quality metrics, and best practice recommendations. A written personalized care plan for preventive services as well as general preventive health recommendations were provided to patient.     Lorrene Reid, LPN   4/0/9811   Nurse Notes: none

## 2022-12-10 NOTE — Patient Instructions (Signed)
[name] , Thank you for taking time to come for your Medicare Wellness Visit. I appreciate your ongoing commitment to your health goals. Please review the following plan we discussed and let me know if I can assist you in the future.   These are the goals we discussed:  Goals      Exercise 3x per week (30 min per time)     12/06/2021 AWV Goal: Exercise for General Health  Patient will verbalize understanding of the benefits of increased physical activity: Exercising regularly is important. It will improve your overall fitness, flexibility, and endurance. Regular exercise also will improve your overall health. It can help you control your weight, reduce stress, and improve your bone density. Over the next year, patient will increase physical activity as tolerated with a goal of at least 150 minutes of moderate physical activity per week.  You can tell that you are exercising at a moderate intensity if your heart starts beating faster and you start breathing faster but can still hold a conversation. Moderate-intensity exercise ideas include: Walking 1 mile (1.6 km) in about 15 minutes Biking Hiking Golfing Dancing Water aerobics Patient will verbalize understanding of everyday activities that increase physical activity by providing examples like the following: Yard work, such as: Insurance underwriter Gardening Washing windows or floors Patient will be able to explain general safety guidelines for exercising:  Before you start a new exercise program, talk with your health care provider. Do not exercise so much that you hurt yourself, feel dizzy, or get very short of breath. Wear comfortable clothes and wear shoes with good support. Drink plenty of water while you exercise to prevent dehydration or heat stroke. Work out until your breathing and your heartbeat get faster.      Prevent falls        This  is a list of the screening recommended for you and due dates:  Health Maintenance  Topic Date Due   COVID-19 Vaccine (7 - 2023-24 season) 04/11/2022   Mammogram  03/21/2023*   DEXA scan (bone density measurement)  12/26/2022   Flu Shot  03/12/2023   Medicare Annual Wellness Visit  12/10/2023   DTaP/Tdap/Td vaccine (3 - Td or Tdap) 09/19/2031   Pneumonia Vaccine  Completed   Zoster (Shingles) Vaccine  Completed   HPV Vaccine  Aged Out   Pap Smear  Discontinued  *Topic was postponed. The date shown is not the original due date.    Advanced directives: Advance directive discussed with you today. I have provided a copy for you to complete at home and have notarized. Once this is complete please bring a copy in to our office so we can scan it into your chart.   Conditions/risks identified: Aim for 30 minutes of exercise or brisk walking, 6-8 glasses of water, and 5 servings of fruits and vegetables each day.   Next appointment: Follow up in one year for your annual wellness visit    Preventive Care 65 Years and Older, Female Preventive care refers to lifestyle choices and visits with your health care provider that can promote health and wellness. What does preventive care include? A yearly physical exam. This is also called an annual well check. Dental exams once or twice a year. Routine eye exams. Ask your health care provider how often you should have your eyes checked. Personal lifestyle choices, including: Daily care of your teeth and gums. Regular  physical activity. Eating a healthy diet. Avoiding tobacco and drug use. Limiting alcohol use. Practicing safe sex. Taking low-dose aspirin every day. Taking vitamin and mineral supplements as recommended by your health care provider. What happens during an annual well check? The services and screenings done by your health care provider during your annual well check will depend on your age, overall health, lifestyle risk factors, and  family history of disease. Counseling  Your health care provider may ask you questions about your: Alcohol use. Tobacco use. Drug use. Emotional well-being. Home and relationship well-being. Sexual activity. Eating habits. History of falls. Memory and ability to understand (cognition). Work and work Astronomer. Reproductive health. Screening  You may have the following tests or measurements: Height, weight, and BMI. Blood pressure. Lipid and cholesterol levels. These may be checked every 5 years, or more frequently if you are over 19 years old. Skin check. Lung cancer screening. You may have this screening every year starting at age 32 if you have a 30-pack-year history of smoking and currently smoke or have quit within the past 15 years. Fecal occult blood test (FOBT) of the stool. You may have this test every year starting at age 54. Flexible sigmoidoscopy or colonoscopy. You may have a sigmoidoscopy every 5 years or a colonoscopy every 10 years starting at age 79. Hepatitis C blood test. Hepatitis B blood test. Sexually transmitted disease (STD) testing. Diabetes screening. This is done by checking your blood sugar (glucose) after you have not eaten for a while (fasting). You may have this done every 1-3 years. Bone density scan. This is done to screen for osteoporosis. You may have this done starting at age 63. Mammogram. This may be done every 1-2 years. Talk to your health care provider about how often you should have regular mammograms. Talk with your health care provider about your test results, treatment options, and if necessary, the need for more tests. Vaccines  Your health care provider may recommend certain vaccines, such as: Influenza vaccine. This is recommended every year. Tetanus, diphtheria, and acellular pertussis (Tdap, Td) vaccine. You may need a Td booster every 10 years. Zoster vaccine. You may need this after age 60. Pneumococcal 13-valent conjugate  (PCV13) vaccine. One dose is recommended after age 51. Pneumococcal polysaccharide (PPSV23) vaccine. One dose is recommended after age 38. Talk to your health care provider about which screenings and vaccines you need and how often you need them. This information is not intended to replace advice given to you by your health care provider. Make sure you discuss any questions you have with your health care provider. Document Released: 08/24/2015 Document Revised: 04/16/2016 Document Reviewed: 05/29/2015 Elsevier Interactive Patient Education  2017 [employer].  Fall Prevention in the Home Falls can cause injuries. They can happen to people of all ages. There are many things you can do to make your home safe and to help prevent falls. What can I do on the outside of my home? Regularly fix the edges of walkways and driveways and fix any cracks. Remove anything that might make you trip as you walk through a door, such as a raised step or threshold. Trim any bushes or trees on the path to your home. Use bright outdoor lighting. Clear any walking paths of anything that might make someone trip, such as rocks or tools. Regularly check to see if handrails are loose or broken. Make sure that both sides of any steps have handrails. Any raised decks and porches should have guardrails  on the edges. Have any leaves, snow, or ice cleared regularly. Use sand or salt on walking paths during winter. Clean up any spills in your garage right away. This includes oil or grease spills. What can I do in the bathroom? Use night lights. Install grab bars by the toilet and in the tub and shower. Do not use towel bars as grab bars. Use non-skid mats or decals in the tub or shower. If you need to sit down in the shower, use a plastic, non-slip stool. Keep the floor dry. Clean up any water that spills on the floor as soon as it happens. Remove soap buildup in the tub or shower regularly. Attach bath mats securely with  double-sided non-slip rug tape. Do not have throw rugs and other things on the floor that can make you trip. What can I do in the bedroom? Use night lights. Make sure that you have a light by your bed that is easy to reach. Do not use any sheets or blankets that are too big for your bed. They should not hang down onto the floor. Have a firm chair that has side arms. You can use this for support while you get dressed. Do not have throw rugs and other things on the floor that can make you trip. What can I do in the kitchen? Clean up any spills right away. Avoid walking on wet floors. Keep items that you use a lot in easy-to-reach places. If you need to reach something above you, use a strong step stool that has a grab bar. Keep electrical cords out of the way. Do not use floor polish or wax that makes floors slippery. If you must use wax, use non-skid floor wax. Do not have throw rugs and other things on the floor that can make you trip. What can I do with my stairs? Do not leave any items on the stairs. Make sure that there are handrails on both sides of the stairs and use them. Fix handrails that are broken or loose. Make sure that handrails are as long as the stairways. Check any carpeting to make sure that it is firmly attached to the stairs. Fix any carpet that is loose or worn. Avoid having throw rugs at the top or bottom of the stairs. If you do have throw rugs, attach them to the floor with carpet tape. Make sure that you have a light switch at the top of the stairs and the bottom of the stairs. If you do not have them, ask someone to add them for you. What else can I do to help prevent falls? Wear shoes that: Do not have high heels. Have rubber bottoms. Are comfortable and fit you well. Are closed at the toe. Do not wear sandals. If you use a stepladder: Make sure that it is fully opened. Do not climb a closed stepladder. Make sure that both sides of the stepladder are locked  into place. Ask someone to hold it for you, if possible. Clearly mark and make sure that you can see: Any grab bars or handrails. First and last steps. Where the edge of each step is. Use tools that help you move around (mobility aids) if they are needed. These include: Canes. Walkers. Scooters. Crutches. Turn on the lights when you go into a dark area. Replace any light bulbs as soon as they burn out. Set up your furniture so you have a clear path. Avoid moving your furniture around. If any of your floors  are uneven, fix them. If there are any pets around you, be aware of where they are. Review your medicines with your doctor. Some medicines can make you feel dizzy. This can increase your chance of falling. Ask your doctor what other things that you can do to help prevent falls. This information is not intended to replace advice given to you by your health care provider. Make sure you discuss any questions you have with your health care provider. Document Released: 05/24/2009 Document Revised: 01/03/2016 Document Reviewed: 09/01/2014 Elsevier Interactive Patient Education  2017 [employer].

## 2023-01-06 DIAGNOSIS — G20A1 Parkinson's disease without dyskinesia, without mention of fluctuations: Secondary | ICD-10-CM | POA: Diagnosis not present

## 2023-01-30 ENCOUNTER — Encounter: Payer: Self-pay | Admitting: Family Medicine

## 2023-02-02 ENCOUNTER — Other Ambulatory Visit: Payer: Self-pay | Admitting: Family Medicine

## 2023-02-02 DIAGNOSIS — R42 Dizziness and giddiness: Secondary | ICD-10-CM

## 2023-02-02 DIAGNOSIS — H9312 Tinnitus, left ear: Secondary | ICD-10-CM

## 2023-02-03 ENCOUNTER — Telehealth: Payer: Self-pay | Admitting: Pharmacy Technician

## 2023-02-03 NOTE — Telephone Encounter (Signed)
Pharmacy Patient Advocate Encounter   Received notification from CoverMyMeds that prior authorization for Meclizine HCl 12.5MG  tablets is required/requested.    PA submitted to [facility] via CoverMyMeds Key/confirmation #/EOC [facility] Status is pending

## 2023-02-03 NOTE — Telephone Encounter (Signed)
Approval letter attached to charts   

## 2023-02-11 NOTE — Telephone Encounter (Signed)
Gavin Pound (Key: [facility]) PA Case ID #: [id number] Rx #: [id number] Need Help? Call us at [phone] Outcome Approved on June 25 PA Case: [id number], Status: Approved, Coverage Starts on: 08/11/2022 12:00:00 AM, Coverage Ends on: 08/11/2023 12:00:00 AM. Questions? Contact [phone]. Authorization Expiration Date: 08/10/2023 Drug Meclizine HCl 12.5MG  tablets ePA cloud [employer], manufacturing systems Electronic PA Form Original Claim Info [id number]  Pharmacy aware

## 2023-02-16 ENCOUNTER — Telehealth: Payer: Self-pay | Admitting: Family Medicine

## 2023-02-16 DIAGNOSIS — C50412 Malignant neoplasm of upper-outer quadrant of left female breast: Secondary | ICD-10-CM

## 2023-02-16 NOTE — Telephone Encounter (Signed)
Order placed. Pt made aware. Informed to call [facility].

## 2023-02-16 NOTE — Telephone Encounter (Signed)
Alright. We will discus this during the visit and fax info at that time to the [facility].

## 2023-02-16 NOTE — Telephone Encounter (Signed)
The breast center wont accept the order until they have documentation with order on why pt needs mammogram done with them. Pt says she has recently found a small lump on her breast which is why she wants to have one done but says she hasn't discussed with Dr Louanne Skye so she made an appt to see Dr Dettinger this Wednesday, 02/18/23.

## 2023-02-18 ENCOUNTER — Encounter: Payer: Self-pay | Admitting: Family Medicine

## 2023-02-18 ENCOUNTER — Ambulatory Visit (INDEPENDENT_AMBULATORY_CARE_PROVIDER_SITE_OTHER): Payer: Medicare PPO | Admitting: Family Medicine

## 2023-02-18 VITALS — BP 144/64 | HR 65 | Ht 63.0 in | Wt 119.0 lb

## 2023-02-18 DIAGNOSIS — N6325 Unspecified lump in the left breast, overlapping quadrants: Secondary | ICD-10-CM

## 2023-02-18 NOTE — Progress Notes (Signed)
BP (!) 144/64   Pulse 65   Ht 5\' 3"  (1.6 m)   Wt 119 lb (54 kg)   SpO2 98%   BMI 21.08 kg/m    Subjective:   Patient ID: Tracey Juarez, female    DOB: December 02, 1940, 82 y.o.   MRN: [id number]  HPI: Tracey Juarez is a 82 y.o. female presenting on 02/18/2023 for Breast Mass   HPI Left breast mass Patient is coming in today with a palpable left breast mass that she noticed over the past few weeks.  She says it is just above the area where she had breast cancer before.  Some left breast just above the nipple.  She says it is nontender cannot tell if it is grown any over the past couple weeks but that is when she started to notice it.  Relevant past medical, surgical, family and social history reviewed and updated as indicated. Interim medical history since our last visit reviewed. Allergies and medications reviewed and updated.  Review of Systems  Constitutional:  Negative for chills and fever.  Eyes:  Negative for visual disturbance.  Respiratory:  Negative for chest tightness and shortness of breath.   Cardiovascular:  Negative for chest pain and leg swelling.  Skin:  Negative for rash.  Neurological:  Negative for light-headedness and headaches.  Psychiatric/Behavioral:  Negative for agitation and behavioral problems.   All other systems reviewed and are negative.   Per HPI unless specifically indicated above   Allergies as of 02/18/2023       Reactions   Cat Hair Extract    Ondansetron Swelling, Palpitations        Medication List        Accurate as of February 18, 2023  1:36 PM. If you have any questions, ask your nurse or doctor.          STOP taking these medications    sulfamethoxazole-trimethoprim 800-160 MG tablet Commonly known as: BACTRIM DS Stopped by: Elige Radon Adelena Desantiago, MD       TAKE these medications    acetaminophen 325 MG tablet Commonly known as: TYLENOL Take 650 mg by mouth every 6 (six) hours as needed.    Carbidopa-Levodopa ER 25-100 MG tablet controlled release Commonly known as: SINEMET CR Take 1 tablet by mouth 3 (three) times daily.   cetirizine 10 MG tablet Commonly known as: ZYRTEC Take 10 mg by mouth daily.   clopidogrel 75 MG tablet Commonly known as: PLAVIX Take 75 mg by mouth daily.   famotidine 10 MG tablet Commonly known as: PEPCID Take 10 mg by mouth daily.   fluticasone 50 MCG/ACT nasal spray Commonly known as: FLONASE USE 1 SPRAY(S) IN EACH NOSTRIL TWICE DAILY AS NEEDED FOR ALLERGIES OR RHINITIS   lisinopril 5 MG tablet Commonly known as: ZESTRIL Take 1 tablet (5 mg total) by mouth daily. What changed:  how much to take additional instructions   meclizine 12.5 MG tablet Commonly known as: ANTIVERT TAKE 1 TO 2 TABLETS BY MOUTH THREE TIMES DAILY AS NEEDED FOR DIZZINESS   multivitamin capsule Take 1 capsule by mouth daily.   oxybutynin 10 MG 24 hr tablet Commonly known as: DITROPAN-XL Take 1 tablet (10 mg total) by mouth at bedtime.   rosuvastatin 10 MG tablet Commonly known as: CRESTOR Take 1 tablet (10 mg total) by mouth daily.   Vitamin D3 1.25 MG (50000 UT) Tabs Take by mouth. Taking 4,000 ui daily         Objective:  BP (!) 144/64   Pulse 65   Ht 5\' 3"  (1.6 m)   Wt 119 lb (54 kg)   SpO2 98%   BMI 21.08 kg/m   Wt Readings from Last 3 Encounters:  02/18/23 119 lb (54 kg)  12/10/22 118 lb (53.5 kg)  09/22/22 115 lb (52.2 kg)    Physical Exam Vitals and nursing note reviewed.  Constitutional:      Appearance: Normal appearance.  Chest:  Breasts:    Right: Normal. No inverted nipple, mass, nipple discharge, skin change or tenderness.     Left: Mass present. No inverted nipple, nipple discharge, skin change or tenderness.    Lymphadenopathy:     Upper Body:     Right upper body: No supraclavicular or axillary adenopathy.     Left upper body: No supraclavicular or axillary adenopathy.  Neurological:     Mental Status: She is  alert.       Assessment & Plan:   Problem List Items Addressed This Visit   None Visit Diagnoses     Mass overlapping multiple quadrants of left breast    -  Primary   Relevant Orders   MM Digital Diagnostic Bilat       Small nodule in left breast just above the nipple about 12:00 region.  Will do diagnostic mammogram Follow up plan: Return if symptoms worsen or fail to improve.  Counseling provided for all of the vaccine components Orders Placed This Encounter  Procedures   MM Digital Diagnostic Bilat    Arville Care, MD [facility] 02/18/2023, 1:36 PM

## 2023-02-24 ENCOUNTER — Other Ambulatory Visit: Payer: Self-pay

## 2023-02-24 DIAGNOSIS — N6325 Unspecified lump in the left breast, overlapping quadrants: Secondary | ICD-10-CM

## 2023-03-10 ENCOUNTER — Ambulatory Visit
Admission: RE | Admit: 2023-03-10 | Discharge: 2023-03-10 | Disposition: A | Discharge status: Home / Self Care | Payer: Medicare PPO | Source: Ambulatory Visit | Attending: Family Medicine | Admitting: Family Medicine

## 2023-03-10 DIAGNOSIS — N6320 Unspecified lump in the left breast, unspecified quadrant: Secondary | ICD-10-CM | POA: Diagnosis not present

## 2023-03-10 DIAGNOSIS — N6325 Unspecified lump in the left breast, overlapping quadrants: Secondary | ICD-10-CM

## 2023-03-10 DIAGNOSIS — N644 Mastodynia: Secondary | ICD-10-CM | POA: Diagnosis not present

## 2023-03-21 DIAGNOSIS — G20A1 Parkinson's disease without dyskinesia, without mention of fluctuations: Secondary | ICD-10-CM | POA: Diagnosis not present

## 2023-03-21 DIAGNOSIS — M25522 Pain in left elbow: Secondary | ICD-10-CM | POA: Diagnosis not present

## 2023-03-21 DIAGNOSIS — I10 Essential (primary) hypertension: Secondary | ICD-10-CM | POA: Diagnosis not present

## 2023-03-21 DIAGNOSIS — W1830XA Fall on same level, unspecified, initial encounter: Secondary | ICD-10-CM | POA: Diagnosis not present

## 2023-03-21 DIAGNOSIS — R0689 Other abnormalities of breathing: Secondary | ICD-10-CM | POA: Diagnosis not present

## 2023-03-21 DIAGNOSIS — W109XXA Fall (on) (from) unspecified stairs and steps, initial encounter: Secondary | ICD-10-CM | POA: Diagnosis not present

## 2023-03-21 DIAGNOSIS — R262 Difficulty in walking, not elsewhere classified: Secondary | ICD-10-CM | POA: Diagnosis not present

## 2023-03-21 DIAGNOSIS — S5002XA Contusion of left elbow, initial encounter: Secondary | ICD-10-CM | POA: Diagnosis not present

## 2023-03-21 DIAGNOSIS — M25552 Pain in left hip: Secondary | ICD-10-CM | POA: Diagnosis not present

## 2023-03-21 DIAGNOSIS — Z7901 Long term (current) use of anticoagulants: Secondary | ICD-10-CM | POA: Diagnosis not present

## 2023-03-21 DIAGNOSIS — R32 Unspecified urinary incontinence: Secondary | ICD-10-CM | POA: Diagnosis not present

## 2023-03-21 DIAGNOSIS — S72002A Fracture of unspecified part of neck of left femur, initial encounter for closed fracture: Secondary | ICD-10-CM | POA: Diagnosis not present

## 2023-03-21 DIAGNOSIS — Z8673 Personal history of transient ischemic attack (TIA), and cerebral infarction without residual deficits: Secondary | ICD-10-CM | POA: Diagnosis not present

## 2023-03-21 DIAGNOSIS — Z9682 Presence of neurostimulator: Secondary | ICD-10-CM | POA: Diagnosis not present

## 2023-03-21 DIAGNOSIS — E785 Hyperlipidemia, unspecified: Secondary | ICD-10-CM | POA: Diagnosis not present

## 2023-03-23 ENCOUNTER — Ambulatory Visit: Payer: Medicare PPO | Admitting: Family Medicine

## 2023-03-23 ENCOUNTER — Other Ambulatory Visit: Payer: Medicare PPO

## 2023-03-27 DIAGNOSIS — R32 Unspecified urinary incontinence: Secondary | ICD-10-CM | POA: Diagnosis not present

## 2023-03-27 DIAGNOSIS — E785 Hyperlipidemia, unspecified: Secondary | ICD-10-CM | POA: Diagnosis not present

## 2023-03-27 DIAGNOSIS — S72002D Fracture of unspecified part of neck of left femur, subsequent encounter for closed fracture with routine healing: Secondary | ICD-10-CM | POA: Diagnosis not present

## 2023-03-27 DIAGNOSIS — S59902D Unspecified injury of left elbow, subsequent encounter: Secondary | ICD-10-CM | POA: Diagnosis not present

## 2023-03-27 DIAGNOSIS — Z8673 Personal history of transient ischemic attack (TIA), and cerebral infarction without residual deficits: Secondary | ICD-10-CM | POA: Diagnosis not present

## 2023-03-27 DIAGNOSIS — Z9181 History of falling: Secondary | ICD-10-CM | POA: Diagnosis not present

## 2023-03-27 DIAGNOSIS — I10 Essential (primary) hypertension: Secondary | ICD-10-CM | POA: Diagnosis not present

## 2023-03-27 DIAGNOSIS — Z7902 Long term (current) use of antithrombotics/antiplatelets: Secondary | ICD-10-CM | POA: Diagnosis not present

## 2023-03-27 DIAGNOSIS — G20A1 Parkinson's disease without dyskinesia, without mention of fluctuations: Secondary | ICD-10-CM | POA: Diagnosis not present

## 2023-03-30 DIAGNOSIS — Z7902 Long term (current) use of antithrombotics/antiplatelets: Secondary | ICD-10-CM | POA: Diagnosis not present

## 2023-03-30 DIAGNOSIS — S59902D Unspecified injury of left elbow, subsequent encounter: Secondary | ICD-10-CM | POA: Diagnosis not present

## 2023-03-30 DIAGNOSIS — S72002D Fracture of unspecified part of neck of left femur, subsequent encounter for closed fracture with routine healing: Secondary | ICD-10-CM | POA: Diagnosis not present

## 2023-03-30 DIAGNOSIS — G20A1 Parkinson's disease without dyskinesia, without mention of fluctuations: Secondary | ICD-10-CM | POA: Diagnosis not present

## 2023-03-30 DIAGNOSIS — Z8673 Personal history of transient ischemic attack (TIA), and cerebral infarction without residual deficits: Secondary | ICD-10-CM | POA: Diagnosis not present

## 2023-03-30 DIAGNOSIS — Z9181 History of falling: Secondary | ICD-10-CM | POA: Diagnosis not present

## 2023-03-30 DIAGNOSIS — I10 Essential (primary) hypertension: Secondary | ICD-10-CM | POA: Diagnosis not present

## 2023-03-30 DIAGNOSIS — R32 Unspecified urinary incontinence: Secondary | ICD-10-CM | POA: Diagnosis not present

## 2023-03-30 DIAGNOSIS — E785 Hyperlipidemia, unspecified: Secondary | ICD-10-CM | POA: Diagnosis not present

## 2023-03-31 DIAGNOSIS — G20A1 Parkinson's disease without dyskinesia, without mention of fluctuations: Secondary | ICD-10-CM | POA: Diagnosis not present

## 2023-03-31 DIAGNOSIS — S72002D Fracture of unspecified part of neck of left femur, subsequent encounter for closed fracture with routine healing: Secondary | ICD-10-CM | POA: Diagnosis not present

## 2023-03-31 DIAGNOSIS — Z7902 Long term (current) use of antithrombotics/antiplatelets: Secondary | ICD-10-CM | POA: Diagnosis not present

## 2023-03-31 DIAGNOSIS — E785 Hyperlipidemia, unspecified: Secondary | ICD-10-CM | POA: Diagnosis not present

## 2023-03-31 DIAGNOSIS — S59902D Unspecified injury of left elbow, subsequent encounter: Secondary | ICD-10-CM | POA: Diagnosis not present

## 2023-03-31 DIAGNOSIS — Z8673 Personal history of transient ischemic attack (TIA), and cerebral infarction without residual deficits: Secondary | ICD-10-CM | POA: Diagnosis not present

## 2023-03-31 DIAGNOSIS — I10 Essential (primary) hypertension: Secondary | ICD-10-CM | POA: Diagnosis not present

## 2023-03-31 DIAGNOSIS — Z9181 History of falling: Secondary | ICD-10-CM | POA: Diagnosis not present

## 2023-03-31 DIAGNOSIS — R32 Unspecified urinary incontinence: Secondary | ICD-10-CM | POA: Diagnosis not present

## 2023-04-03 DIAGNOSIS — Z8673 Personal history of transient ischemic attack (TIA), and cerebral infarction without residual deficits: Secondary | ICD-10-CM | POA: Diagnosis not present

## 2023-04-03 DIAGNOSIS — S59902D Unspecified injury of left elbow, subsequent encounter: Secondary | ICD-10-CM | POA: Diagnosis not present

## 2023-04-03 DIAGNOSIS — R32 Unspecified urinary incontinence: Secondary | ICD-10-CM | POA: Diagnosis not present

## 2023-04-03 DIAGNOSIS — I10 Essential (primary) hypertension: Secondary | ICD-10-CM | POA: Diagnosis not present

## 2023-04-03 DIAGNOSIS — S72002D Fracture of unspecified part of neck of left femur, subsequent encounter for closed fracture with routine healing: Secondary | ICD-10-CM | POA: Diagnosis not present

## 2023-04-03 DIAGNOSIS — E785 Hyperlipidemia, unspecified: Secondary | ICD-10-CM | POA: Diagnosis not present

## 2023-04-03 DIAGNOSIS — G20A1 Parkinson's disease without dyskinesia, without mention of fluctuations: Secondary | ICD-10-CM | POA: Diagnosis not present

## 2023-04-03 DIAGNOSIS — Z9181 History of falling: Secondary | ICD-10-CM | POA: Diagnosis not present

## 2023-04-03 DIAGNOSIS — Z7902 Long term (current) use of antithrombotics/antiplatelets: Secondary | ICD-10-CM | POA: Diagnosis not present

## 2023-04-06 DIAGNOSIS — S59902D Unspecified injury of left elbow, subsequent encounter: Secondary | ICD-10-CM | POA: Diagnosis not present

## 2023-04-06 DIAGNOSIS — I10 Essential (primary) hypertension: Secondary | ICD-10-CM | POA: Diagnosis not present

## 2023-04-06 DIAGNOSIS — E785 Hyperlipidemia, unspecified: Secondary | ICD-10-CM | POA: Diagnosis not present

## 2023-04-06 DIAGNOSIS — S72002D Fracture of unspecified part of neck of left femur, subsequent encounter for closed fracture with routine healing: Secondary | ICD-10-CM | POA: Diagnosis not present

## 2023-04-06 DIAGNOSIS — Z7902 Long term (current) use of antithrombotics/antiplatelets: Secondary | ICD-10-CM | POA: Diagnosis not present

## 2023-04-06 DIAGNOSIS — G20A1 Parkinson's disease without dyskinesia, without mention of fluctuations: Secondary | ICD-10-CM | POA: Diagnosis not present

## 2023-04-06 DIAGNOSIS — S72002A Fracture of unspecified part of neck of left femur, initial encounter for closed fracture: Secondary | ICD-10-CM | POA: Diagnosis not present

## 2023-04-06 DIAGNOSIS — Z8673 Personal history of transient ischemic attack (TIA), and cerebral infarction without residual deficits: Secondary | ICD-10-CM | POA: Diagnosis not present

## 2023-04-06 DIAGNOSIS — R32 Unspecified urinary incontinence: Secondary | ICD-10-CM | POA: Diagnosis not present

## 2023-04-06 DIAGNOSIS — Z9181 History of falling: Secondary | ICD-10-CM | POA: Diagnosis not present

## 2023-04-07 DIAGNOSIS — E785 Hyperlipidemia, unspecified: Secondary | ICD-10-CM | POA: Diagnosis not present

## 2023-04-07 DIAGNOSIS — I10 Essential (primary) hypertension: Secondary | ICD-10-CM | POA: Diagnosis not present

## 2023-04-07 DIAGNOSIS — G20A1 Parkinson's disease without dyskinesia, without mention of fluctuations: Secondary | ICD-10-CM | POA: Diagnosis not present

## 2023-04-07 DIAGNOSIS — Z9181 History of falling: Secondary | ICD-10-CM | POA: Diagnosis not present

## 2023-04-07 DIAGNOSIS — Z8673 Personal history of transient ischemic attack (TIA), and cerebral infarction without residual deficits: Secondary | ICD-10-CM | POA: Diagnosis not present

## 2023-04-07 DIAGNOSIS — R32 Unspecified urinary incontinence: Secondary | ICD-10-CM | POA: Diagnosis not present

## 2023-04-07 DIAGNOSIS — S59902D Unspecified injury of left elbow, subsequent encounter: Secondary | ICD-10-CM | POA: Diagnosis not present

## 2023-04-07 DIAGNOSIS — S72002D Fracture of unspecified part of neck of left femur, subsequent encounter for closed fracture with routine healing: Secondary | ICD-10-CM | POA: Diagnosis not present

## 2023-04-07 DIAGNOSIS — Z7902 Long term (current) use of antithrombotics/antiplatelets: Secondary | ICD-10-CM | POA: Diagnosis not present

## 2023-04-09 DIAGNOSIS — S59902D Unspecified injury of left elbow, subsequent encounter: Secondary | ICD-10-CM | POA: Diagnosis not present

## 2023-04-09 DIAGNOSIS — Z9181 History of falling: Secondary | ICD-10-CM | POA: Diagnosis not present

## 2023-04-09 DIAGNOSIS — Z8673 Personal history of transient ischemic attack (TIA), and cerebral infarction without residual deficits: Secondary | ICD-10-CM | POA: Diagnosis not present

## 2023-04-09 DIAGNOSIS — I10 Essential (primary) hypertension: Secondary | ICD-10-CM | POA: Diagnosis not present

## 2023-04-09 DIAGNOSIS — R32 Unspecified urinary incontinence: Secondary | ICD-10-CM | POA: Diagnosis not present

## 2023-04-09 DIAGNOSIS — E785 Hyperlipidemia, unspecified: Secondary | ICD-10-CM | POA: Diagnosis not present

## 2023-04-09 DIAGNOSIS — S72002D Fracture of unspecified part of neck of left femur, subsequent encounter for closed fracture with routine healing: Secondary | ICD-10-CM | POA: Diagnosis not present

## 2023-04-09 DIAGNOSIS — G20A1 Parkinson's disease without dyskinesia, without mention of fluctuations: Secondary | ICD-10-CM | POA: Diagnosis not present

## 2023-04-09 DIAGNOSIS — Z7902 Long term (current) use of antithrombotics/antiplatelets: Secondary | ICD-10-CM | POA: Diagnosis not present

## 2023-04-13 DIAGNOSIS — Z7902 Long term (current) use of antithrombotics/antiplatelets: Secondary | ICD-10-CM | POA: Diagnosis not present

## 2023-04-13 DIAGNOSIS — S72002D Fracture of unspecified part of neck of left femur, subsequent encounter for closed fracture with routine healing: Secondary | ICD-10-CM | POA: Diagnosis not present

## 2023-04-13 DIAGNOSIS — E785 Hyperlipidemia, unspecified: Secondary | ICD-10-CM | POA: Diagnosis not present

## 2023-04-13 DIAGNOSIS — G20A1 Parkinson's disease without dyskinesia, without mention of fluctuations: Secondary | ICD-10-CM | POA: Diagnosis not present

## 2023-04-13 DIAGNOSIS — Z9181 History of falling: Secondary | ICD-10-CM | POA: Diagnosis not present

## 2023-04-13 DIAGNOSIS — R32 Unspecified urinary incontinence: Secondary | ICD-10-CM | POA: Diagnosis not present

## 2023-04-13 DIAGNOSIS — Z8673 Personal history of transient ischemic attack (TIA), and cerebral infarction without residual deficits: Secondary | ICD-10-CM | POA: Diagnosis not present

## 2023-04-13 DIAGNOSIS — I10 Essential (primary) hypertension: Secondary | ICD-10-CM | POA: Diagnosis not present

## 2023-04-13 DIAGNOSIS — S59902D Unspecified injury of left elbow, subsequent encounter: Secondary | ICD-10-CM | POA: Diagnosis not present

## 2023-04-14 DIAGNOSIS — G20A1 Parkinson's disease without dyskinesia, without mention of fluctuations: Secondary | ICD-10-CM | POA: Diagnosis not present

## 2023-04-14 DIAGNOSIS — Z8673 Personal history of transient ischemic attack (TIA), and cerebral infarction without residual deficits: Secondary | ICD-10-CM | POA: Diagnosis not present

## 2023-04-14 DIAGNOSIS — S59902D Unspecified injury of left elbow, subsequent encounter: Secondary | ICD-10-CM | POA: Diagnosis not present

## 2023-04-14 DIAGNOSIS — Z9181 History of falling: Secondary | ICD-10-CM | POA: Diagnosis not present

## 2023-04-14 DIAGNOSIS — I10 Essential (primary) hypertension: Secondary | ICD-10-CM | POA: Diagnosis not present

## 2023-04-14 DIAGNOSIS — S72002D Fracture of unspecified part of neck of left femur, subsequent encounter for closed fracture with routine healing: Secondary | ICD-10-CM | POA: Diagnosis not present

## 2023-04-14 DIAGNOSIS — R32 Unspecified urinary incontinence: Secondary | ICD-10-CM | POA: Diagnosis not present

## 2023-04-14 DIAGNOSIS — Z7902 Long term (current) use of antithrombotics/antiplatelets: Secondary | ICD-10-CM | POA: Diagnosis not present

## 2023-04-14 DIAGNOSIS — E785 Hyperlipidemia, unspecified: Secondary | ICD-10-CM | POA: Diagnosis not present

## 2023-04-15 DIAGNOSIS — Z7902 Long term (current) use of antithrombotics/antiplatelets: Secondary | ICD-10-CM | POA: Diagnosis not present

## 2023-04-15 DIAGNOSIS — S72002D Fracture of unspecified part of neck of left femur, subsequent encounter for closed fracture with routine healing: Secondary | ICD-10-CM | POA: Diagnosis not present

## 2023-04-15 DIAGNOSIS — Z8673 Personal history of transient ischemic attack (TIA), and cerebral infarction without residual deficits: Secondary | ICD-10-CM | POA: Diagnosis not present

## 2023-04-15 DIAGNOSIS — Z9181 History of falling: Secondary | ICD-10-CM | POA: Diagnosis not present

## 2023-04-15 DIAGNOSIS — G20A1 Parkinson's disease without dyskinesia, without mention of fluctuations: Secondary | ICD-10-CM | POA: Diagnosis not present

## 2023-04-15 DIAGNOSIS — E785 Hyperlipidemia, unspecified: Secondary | ICD-10-CM | POA: Diagnosis not present

## 2023-04-15 DIAGNOSIS — S59902D Unspecified injury of left elbow, subsequent encounter: Secondary | ICD-10-CM | POA: Diagnosis not present

## 2023-04-15 DIAGNOSIS — R32 Unspecified urinary incontinence: Secondary | ICD-10-CM | POA: Diagnosis not present

## 2023-04-15 DIAGNOSIS — I10 Essential (primary) hypertension: Secondary | ICD-10-CM | POA: Diagnosis not present

## 2023-04-20 DIAGNOSIS — G20A1 Parkinson's disease without dyskinesia, without mention of fluctuations: Secondary | ICD-10-CM | POA: Diagnosis not present

## 2023-04-20 DIAGNOSIS — S59902D Unspecified injury of left elbow, subsequent encounter: Secondary | ICD-10-CM | POA: Diagnosis not present

## 2023-04-20 DIAGNOSIS — S72002D Fracture of unspecified part of neck of left femur, subsequent encounter for closed fracture with routine healing: Secondary | ICD-10-CM | POA: Diagnosis not present

## 2023-04-20 DIAGNOSIS — Z9181 History of falling: Secondary | ICD-10-CM | POA: Diagnosis not present

## 2023-04-20 DIAGNOSIS — Z7902 Long term (current) use of antithrombotics/antiplatelets: Secondary | ICD-10-CM | POA: Diagnosis not present

## 2023-04-20 DIAGNOSIS — I10 Essential (primary) hypertension: Secondary | ICD-10-CM | POA: Diagnosis not present

## 2023-04-20 DIAGNOSIS — R32 Unspecified urinary incontinence: Secondary | ICD-10-CM | POA: Diagnosis not present

## 2023-04-20 DIAGNOSIS — Z8673 Personal history of transient ischemic attack (TIA), and cerebral infarction without residual deficits: Secondary | ICD-10-CM | POA: Diagnosis not present

## 2023-04-20 DIAGNOSIS — E785 Hyperlipidemia, unspecified: Secondary | ICD-10-CM | POA: Diagnosis not present

## 2023-04-25 DIAGNOSIS — I10 Essential (primary) hypertension: Secondary | ICD-10-CM | POA: Diagnosis not present

## 2023-04-25 DIAGNOSIS — E785 Hyperlipidemia, unspecified: Secondary | ICD-10-CM | POA: Diagnosis not present

## 2023-04-25 DIAGNOSIS — R32 Unspecified urinary incontinence: Secondary | ICD-10-CM | POA: Diagnosis not present

## 2023-04-25 DIAGNOSIS — G20A1 Parkinson's disease without dyskinesia, without mention of fluctuations: Secondary | ICD-10-CM | POA: Diagnosis not present

## 2023-04-25 DIAGNOSIS — Z7902 Long term (current) use of antithrombotics/antiplatelets: Secondary | ICD-10-CM | POA: Diagnosis not present

## 2023-04-25 DIAGNOSIS — S59902D Unspecified injury of left elbow, subsequent encounter: Secondary | ICD-10-CM | POA: Diagnosis not present

## 2023-04-25 DIAGNOSIS — Z9181 History of falling: Secondary | ICD-10-CM | POA: Diagnosis not present

## 2023-04-25 DIAGNOSIS — Z8673 Personal history of transient ischemic attack (TIA), and cerebral infarction without residual deficits: Secondary | ICD-10-CM | POA: Diagnosis not present

## 2023-04-25 DIAGNOSIS — S72002D Fracture of unspecified part of neck of left femur, subsequent encounter for closed fracture with routine healing: Secondary | ICD-10-CM | POA: Diagnosis not present

## 2023-04-29 DIAGNOSIS — Z8673 Personal history of transient ischemic attack (TIA), and cerebral infarction without residual deficits: Secondary | ICD-10-CM | POA: Diagnosis not present

## 2023-04-29 DIAGNOSIS — I10 Essential (primary) hypertension: Secondary | ICD-10-CM | POA: Diagnosis not present

## 2023-04-29 DIAGNOSIS — S59902D Unspecified injury of left elbow, subsequent encounter: Secondary | ICD-10-CM | POA: Diagnosis not present

## 2023-04-29 DIAGNOSIS — S72002D Fracture of unspecified part of neck of left femur, subsequent encounter for closed fracture with routine healing: Secondary | ICD-10-CM | POA: Diagnosis not present

## 2023-04-29 DIAGNOSIS — E785 Hyperlipidemia, unspecified: Secondary | ICD-10-CM | POA: Diagnosis not present

## 2023-04-29 DIAGNOSIS — Z9181 History of falling: Secondary | ICD-10-CM | POA: Diagnosis not present

## 2023-04-29 DIAGNOSIS — R32 Unspecified urinary incontinence: Secondary | ICD-10-CM | POA: Diagnosis not present

## 2023-04-29 DIAGNOSIS — G20A1 Parkinson's disease without dyskinesia, without mention of fluctuations: Secondary | ICD-10-CM | POA: Diagnosis not present

## 2023-04-29 DIAGNOSIS — Z7902 Long term (current) use of antithrombotics/antiplatelets: Secondary | ICD-10-CM | POA: Diagnosis not present

## 2023-05-04 DIAGNOSIS — Z4889 Encounter for other specified surgical aftercare: Secondary | ICD-10-CM | POA: Diagnosis not present

## 2023-05-04 DIAGNOSIS — S72002A Fracture of unspecified part of neck of left femur, initial encounter for closed fracture: Secondary | ICD-10-CM | POA: Diagnosis not present

## 2023-05-15 ENCOUNTER — Other Ambulatory Visit: Payer: Medicare PPO

## 2023-05-15 ENCOUNTER — Ambulatory Visit: Payer: Medicare PPO | Admitting: Family Medicine

## 2023-05-28 DIAGNOSIS — J029 Acute pharyngitis, unspecified: Secondary | ICD-10-CM | POA: Diagnosis not present

## 2023-05-28 DIAGNOSIS — R0981 Nasal congestion: Secondary | ICD-10-CM | POA: Diagnosis not present

## 2023-05-28 DIAGNOSIS — R030 Elevated blood-pressure reading, without diagnosis of hypertension: Secondary | ICD-10-CM | POA: Diagnosis not present

## 2023-05-28 DIAGNOSIS — Z6821 Body mass index (BMI) 21.0-21.9, adult: Secondary | ICD-10-CM | POA: Diagnosis not present

## 2023-05-28 DIAGNOSIS — R319 Hematuria, unspecified: Secondary | ICD-10-CM | POA: Diagnosis not present

## 2023-05-31 DIAGNOSIS — U071 COVID-19: Secondary | ICD-10-CM | POA: Diagnosis not present

## 2023-05-31 DIAGNOSIS — S72002A Fracture of unspecified part of neck of left femur, initial encounter for closed fracture: Secondary | ICD-10-CM | POA: Diagnosis not present

## 2023-05-31 DIAGNOSIS — R079 Chest pain, unspecified: Secondary | ICD-10-CM | POA: Diagnosis not present

## 2023-06-09 DIAGNOSIS — R030 Elevated blood-pressure reading, without diagnosis of hypertension: Secondary | ICD-10-CM | POA: Diagnosis not present

## 2023-06-09 DIAGNOSIS — Z6821 Body mass index (BMI) 21.0-21.9, adult: Secondary | ICD-10-CM | POA: Diagnosis not present

## 2023-06-09 DIAGNOSIS — M62830 Muscle spasm of back: Secondary | ICD-10-CM | POA: Diagnosis not present

## 2023-06-15 DIAGNOSIS — Z4889 Encounter for other specified surgical aftercare: Secondary | ICD-10-CM | POA: Diagnosis not present

## 2023-06-15 DIAGNOSIS — S72002A Fracture of unspecified part of neck of left femur, initial encounter for closed fracture: Secondary | ICD-10-CM | POA: Diagnosis not present

## 2023-06-18 DIAGNOSIS — L821 Other seborrheic keratosis: Secondary | ICD-10-CM | POA: Diagnosis not present

## 2023-06-18 DIAGNOSIS — L570 Actinic keratosis: Secondary | ICD-10-CM | POA: Diagnosis not present

## 2023-06-18 DIAGNOSIS — Z85828 Personal history of other malignant neoplasm of skin: Secondary | ICD-10-CM | POA: Diagnosis not present

## 2023-06-19 ENCOUNTER — Other Ambulatory Visit: Payer: Medicare PPO

## 2023-06-19 ENCOUNTER — Ambulatory Visit (INDEPENDENT_AMBULATORY_CARE_PROVIDER_SITE_OTHER): Payer: Medicare PPO | Admitting: Family Medicine

## 2023-06-19 ENCOUNTER — Encounter: Payer: Self-pay | Admitting: Family Medicine

## 2023-06-19 VITALS — BP 155/71 | HR 72 | Temp 98.5°F | Ht 63.0 in | Wt 115.0 lb

## 2023-06-19 DIAGNOSIS — E7800 Pure hypercholesterolemia, unspecified: Secondary | ICD-10-CM | POA: Diagnosis not present

## 2023-06-19 DIAGNOSIS — S72002D Fracture of unspecified part of neck of left femur, subsequent encounter for closed fracture with routine healing: Secondary | ICD-10-CM | POA: Diagnosis not present

## 2023-06-19 DIAGNOSIS — M8589 Other specified disorders of bone density and structure, multiple sites: Secondary | ICD-10-CM | POA: Diagnosis not present

## 2023-06-19 DIAGNOSIS — I10 Essential (primary) hypertension: Secondary | ICD-10-CM | POA: Diagnosis not present

## 2023-06-19 DIAGNOSIS — S72009D Fracture of unspecified part of neck of unspecified femur, subsequent encounter for closed fracture with routine healing: Secondary | ICD-10-CM

## 2023-06-19 DIAGNOSIS — Z23 Encounter for immunization: Secondary | ICD-10-CM | POA: Diagnosis not present

## 2023-06-19 NOTE — Progress Notes (Signed)
BP (!) 155/71   Pulse 72   Temp 98.5 F (36.9 C)   Ht 5\' 3"  (1.6 m)   Wt 115 lb (52.2 kg)   SpO2 97%   BMI 20.37 kg/m    Subjective:   Patient ID: Tracey Juarez, female    DOB: 12-04-1940, 82 y.o.   MRN: [id number]  HPI: Aynslie Kowatch is a 82 y.o. female presenting on 06/19/2023 for Medical Management of Chronic Issues   HPI Hypertension Patient is currently on lisinopril, and their blood pressure today is 155/71 but it is difficult for her to walk in here today with her recent left femur fracture. Patient denies any lightheadedness or dizziness. Patient denies headaches, blurred vision, chest pains, shortness of breath, or weakness. Denies any side effects from medication and is content with current medication.   Hyperlipidemia Patient is coming in for recheck of his hyperlipidemia. The patient is currently taking Crestor. They deny any issues with myalgias or history of liver damage from it. They deny any focal numbness or weakness or chest pain.   Osteoporosis/osteopenia Fractures or history of fracture: Left femur neck fracture from fall 2 months ago Medication: None currently except vitamin D and calcium Duration of treatment:  Last bone density scan: 12/25/2020, is due for 1 Last T score: -1.8  She is doing therapy for her left femur fracture and getting her strength back up.  Relevant past medical, surgical, family and social history reviewed and updated as indicated. Interim medical history since our last visit reviewed. Allergies and medications reviewed and updated.  Review of Systems  Constitutional:  Negative for chills and fever.  HENT:  Negative for congestion, ear discharge and ear pain.   Eyes:  Negative for redness and visual disturbance.  Respiratory:  Negative for chest tightness and shortness of breath.   Cardiovascular:  Negative for chest pain and leg swelling.  Genitourinary:  Negative for difficulty urinating and dysuria.   Musculoskeletal:  Negative for back pain and gait problem.  Skin:  Positive for wound (Small skin tear on left forearm, is applying Vaseline and bandage and no signs of infection). Negative for rash.  Neurological:  Negative for dizziness, light-headedness and headaches.  Psychiatric/Behavioral:  Negative for agitation and behavioral problems.   All other systems reviewed and are negative.   Per HPI unless specifically indicated above   Allergies as of 06/19/2023       Reactions   Cat Hair Extract    Ondansetron Swelling, Palpitations        Medication List        Accurate as of June 19, 2023  3:27 PM. If you have any questions, ask your nurse or doctor.          acetaminophen 325 MG tablet Commonly known as: TYLENOL Take 650 mg by mouth every 6 (six) hours as needed.   Carbidopa-Levodopa ER 25-100 MG tablet controlled release Commonly known as: SINEMET CR Take 1 tablet by mouth 3 (three) times daily.   cetirizine 10 MG tablet Commonly known as: ZYRTEC Take 10 mg by mouth daily.   clopidogrel 75 MG tablet Commonly known as: PLAVIX Take 75 mg by mouth daily.   famotidine 10 MG tablet Commonly known as: PEPCID Take 10 mg by mouth daily.   fluticasone 50 MCG/ACT nasal spray Commonly known as: FLONASE USE 1 SPRAY(S) IN EACH NOSTRIL TWICE DAILY AS NEEDED FOR ALLERGIES OR RHINITIS   lisinopril 5 MG tablet Commonly known as: ZESTRIL Take 1  tablet (5 mg total) by mouth daily. What changed:  how much to take additional instructions   meclizine 12.5 MG tablet Commonly known as: ANTIVERT TAKE 1 TO 2 TABLETS BY MOUTH THREE TIMES DAILY AS NEEDED FOR DIZZINESS   multivitamin capsule Take 1 capsule by mouth daily.   oxybutynin 10 MG 24 hr tablet Commonly known as: DITROPAN-XL Take 1 tablet (10 mg total) by mouth at bedtime.   rosuvastatin 10 MG tablet Commonly known as: CRESTOR Take 1 tablet (10 mg total) by mouth daily.   Vitamin D3 1.25 MG (50000 UT)  Tabs Take by mouth. Taking 4,000 ui daily         Objective:   BP (!) 155/71   Pulse 72   Temp 98.5 F (36.9 C)   Ht 5\' 3"  (1.6 m)   Wt 115 lb (52.2 kg)   SpO2 97%   BMI 20.37 kg/m   Wt Readings from Last 3 Encounters:  06/19/23 115 lb (52.2 kg)  02/18/23 119 lb (54 kg)  12/10/22 118 lb (53.5 kg)    Physical Exam Vitals and nursing note reviewed.  Constitutional:      General: She is not in acute distress.    Appearance: She is well-developed. She is not diaphoretic.  Eyes:     Conjunctiva/sclera: Conjunctivae normal.     Pupils: Pupils are equal, round, and reactive to light.  Cardiovascular:     Rate and Rhythm: Normal rate and regular rhythm.     Heart sounds: Normal heart sounds. No murmur heard. Pulmonary:     Effort: Pulmonary effort is normal. No respiratory distress.     Breath sounds: Normal breath sounds. No wheezing.  Musculoskeletal:        General: No tenderness. Normal range of motion.  Skin:    General: Skin is warm and dry.     Findings: Wound (Small skin tear on left forearm, appears clean, no signs of erythema or infection) present. No rash.  Neurological:     Mental Status: She is alert and oriented to person, place, and time.     Coordination: Coordination normal.  Psychiatric:        Behavior: Behavior normal.       Assessment & Plan:   Problem List Items Addressed This Visit       Cardiovascular and Mediastinum   Hypertension   Relevant Orders   CBC with Differential/Platelet   CMP14+EGFR     Musculoskeletal and Integument   Osteopenia - Primary   Relevant Orders   AMB Referral VBCI Care Management     Other   Pure hypercholesterolemia   Relevant Orders   Lipid panel   Other Visit Diagnoses     Closed fracture of neck of femur with routine healing, unspecified laterality, subsequent encounter           Will refer to pharmacy for osteopenia management.  She is also due for a bone density scan and we will get that  scheduled for repeat.  The machine is down today so she is not able to do it here today. Follow up plan: Return in about 6 months (around 12/17/2023), or if symptoms worsen or fail to improve, for Hypertension and hyperlipidemia.  Counseling provided for all of the vaccine components Orders Placed This Encounter  Procedures   CBC with Differential/Platelet   CMP14+EGFR   Lipid panel   AMB Referral VBCI Care Management    Arville Care, MD [facility] 06/19/2023, 3:27 PM

## 2023-06-20 LAB — CBC WITH DIFFERENTIAL/PLATELET
Basophils Absolute: 0 10*3/uL (ref 0.0–0.2)
Basos: 0 %
EOS (ABSOLUTE): 0 10*3/uL (ref 0.0–0.4)
Eos: 1 %
Hematocrit: 37.4 % (ref 34.0–46.6)
Hemoglobin: 11.7 g/dL (ref 11.1–15.9)
Immature Grans (Abs): 0 10*3/uL (ref 0.0–0.1)
Immature Granulocytes: 0 %
Lymphocytes Absolute: 1 10*3/uL (ref 0.7–3.1)
Lymphs: 22 %
MCH: 30.6 pg (ref 26.6–33.0)
MCHC: 31.3 g/dL — ABNORMAL LOW (ref 31.5–35.7)
MCV: 98 fL — ABNORMAL HIGH (ref 79–97)
Monocytes Absolute: 0.7 10*3/uL (ref 0.1–0.9)
Monocytes: 16 %
Neutrophils Absolute: 2.7 10*3/uL (ref 1.4–7.0)
Neutrophils: 61 %
Platelets: 210 10*3/uL (ref 150–450)
RBC: 3.82 x10E6/uL (ref 3.77–5.28)
RDW: 12.7 % (ref 11.7–15.4)
WBC: 4.5 10*3/uL (ref 3.4–10.8)

## 2023-06-20 LAB — CMP14+EGFR
ALT: 7 [IU]/L (ref 0–32)
AST: 16 [IU]/L (ref 0–40)
Albumin: 4 g/dL (ref 3.7–4.7)
Alkaline Phosphatase: 92 [IU]/L (ref 44–121)
BUN/Creatinine Ratio: 25 (ref 12–28)
BUN: 17 mg/dL (ref 8–27)
Bilirubin Total: 0.3 mg/dL (ref 0.0–1.2)
CO2: 25 mmol/L (ref 20–29)
Calcium: 9.7 mg/dL (ref 8.7–10.3)
Chloride: 104 mmol/L (ref 96–106)
Creatinine, Ser: 0.67 mg/dL (ref 0.57–1.00)
Globulin, Total: 2.5 g/dL (ref 1.5–4.5)
Glucose: 99 mg/dL (ref 70–99)
Potassium: 4.5 mmol/L (ref 3.5–5.2)
Sodium: 142 mmol/L (ref 134–144)
Total Protein: 6.5 g/dL (ref 6.0–8.5)
eGFR: 88 mL/min/{1.73_m2} (ref >59)

## 2023-06-20 LAB — LIPID PANEL
Chol/HDL Ratio: 2.1 ratio (ref 0.0–4.4)
Cholesterol, Total: 145 mg/dL (ref 100–199)
HDL: 68 mg/dL (ref >39)
LDL Chol Calc (NIH): 62 mg/dL (ref 0–99)
Triglycerides: 77 mg/dL (ref 0–149)
VLDL Cholesterol Cal: 15 mg/dL (ref 5–40)

## 2023-06-22 DIAGNOSIS — M6281 Muscle weakness (generalized): Secondary | ICD-10-CM | POA: Diagnosis not present

## 2023-06-22 DIAGNOSIS — M25552 Pain in left hip: Secondary | ICD-10-CM | POA: Diagnosis not present

## 2023-06-23 ENCOUNTER — Telehealth: Payer: Self-pay

## 2023-06-23 NOTE — Progress Notes (Signed)
   Care Guide Note  06/23/2023 Name: Tracey Juarez MRN: [id number] DOB: Jul 09, 1941  Referred by: Dettinger, Elige Radon, MD Reason for referral : Care Coordination (Outreach to schedule with Pharm d )   Tracey Juarez is a 82 y.o. year old female who is a primary care patient of Dettinger, Elige Radon, MD. Tracey Juarez was referred to the pharmacist for assistance related to  osteopenia .    Successful contact was made with the patient to discuss pharmacy services including being ready for the pharmacist to call at least 5 minutes before the scheduled appointment time, to have medication bottles and any blood sugar or blood pressure readings ready for review. The patient agreed to meet with the pharmacist via with the pharmacist via telephone visit on (date/time).  07/24/2023  Penne Lash, RMA Care Guide [facility]  [place], [place] [address] Direct Dial: [phone] [email or web address]

## 2023-07-03 DIAGNOSIS — M25552 Pain in left hip: Secondary | ICD-10-CM | POA: Diagnosis not present

## 2023-07-03 DIAGNOSIS — M6281 Muscle weakness (generalized): Secondary | ICD-10-CM | POA: Diagnosis not present

## 2023-07-08 DIAGNOSIS — M25552 Pain in left hip: Secondary | ICD-10-CM | POA: Diagnosis not present

## 2023-07-08 DIAGNOSIS — M6281 Muscle weakness (generalized): Secondary | ICD-10-CM | POA: Diagnosis not present

## 2023-07-10 DIAGNOSIS — M6281 Muscle weakness (generalized): Secondary | ICD-10-CM | POA: Diagnosis not present

## 2023-07-10 DIAGNOSIS — M25552 Pain in left hip: Secondary | ICD-10-CM | POA: Diagnosis not present

## 2023-07-14 DIAGNOSIS — M25552 Pain in left hip: Secondary | ICD-10-CM | POA: Diagnosis not present

## 2023-07-14 DIAGNOSIS — M6281 Muscle weakness (generalized): Secondary | ICD-10-CM | POA: Diagnosis not present

## 2023-07-16 DIAGNOSIS — M6281 Muscle weakness (generalized): Secondary | ICD-10-CM | POA: Diagnosis not present

## 2023-07-16 DIAGNOSIS — M25552 Pain in left hip: Secondary | ICD-10-CM | POA: Diagnosis not present

## 2023-07-18 DIAGNOSIS — R079 Chest pain, unspecified: Secondary | ICD-10-CM | POA: Diagnosis not present

## 2023-07-18 DIAGNOSIS — S0990XA Unspecified injury of head, initial encounter: Secondary | ICD-10-CM | POA: Diagnosis not present

## 2023-07-18 DIAGNOSIS — W228XXA Striking against or struck by other objects, initial encounter: Secondary | ICD-10-CM | POA: Diagnosis not present

## 2023-07-18 DIAGNOSIS — S0101XA Laceration without foreign body of scalp, initial encounter: Secondary | ICD-10-CM | POA: Diagnosis not present

## 2023-07-21 ENCOUNTER — Encounter (HOSPITAL_COMMUNITY): Payer: Self-pay | Admitting: *Deleted

## 2023-07-21 ENCOUNTER — Emergency Department (HOSPITAL_COMMUNITY): Payer: Medicare PPO

## 2023-07-21 ENCOUNTER — Inpatient Hospital Stay (HOSPITAL_COMMUNITY): Payer: Medicare PPO

## 2023-07-21 ENCOUNTER — Observation Stay (HOSPITAL_COMMUNITY)
Admission: EM | Admit: 2023-07-21 | Discharge: 2023-07-22 | Disposition: A | Discharge status: Home / Self Care | Payer: Medicare PPO | Attending: Internal Medicine | Admitting: Internal Medicine

## 2023-07-21 ENCOUNTER — Other Ambulatory Visit: Payer: Self-pay

## 2023-07-21 DIAGNOSIS — W19XXXA Unspecified fall, initial encounter: Secondary | ICD-10-CM

## 2023-07-21 DIAGNOSIS — Z8673 Personal history of transient ischemic attack (TIA), and cerebral infarction without residual deficits: Secondary | ICD-10-CM | POA: Insufficient documentation

## 2023-07-21 DIAGNOSIS — I10 Essential (primary) hypertension: Secondary | ICD-10-CM | POA: Diagnosis present

## 2023-07-21 DIAGNOSIS — Z79899 Other long term (current) drug therapy: Secondary | ICD-10-CM | POA: Insufficient documentation

## 2023-07-21 DIAGNOSIS — Z85828 Personal history of other malignant neoplasm of skin: Secondary | ICD-10-CM | POA: Diagnosis not present

## 2023-07-21 DIAGNOSIS — E782 Mixed hyperlipidemia: Secondary | ICD-10-CM | POA: Diagnosis not present

## 2023-07-21 DIAGNOSIS — K8689 Other specified diseases of pancreas: Secondary | ICD-10-CM | POA: Diagnosis not present

## 2023-07-21 DIAGNOSIS — Z7902 Long term (current) use of antithrombotics/antiplatelets: Secondary | ICD-10-CM | POA: Insufficient documentation

## 2023-07-21 DIAGNOSIS — M25552 Pain in left hip: Secondary | ICD-10-CM | POA: Diagnosis not present

## 2023-07-21 DIAGNOSIS — J9811 Atelectasis: Secondary | ICD-10-CM | POA: Insufficient documentation

## 2023-07-21 DIAGNOSIS — R296 Repeated falls: Secondary | ICD-10-CM | POA: Diagnosis not present

## 2023-07-21 DIAGNOSIS — G20A1 Parkinson's disease without dyskinesia, without mention of fluctuations: Secondary | ICD-10-CM | POA: Diagnosis present

## 2023-07-21 DIAGNOSIS — E049 Nontoxic goiter, unspecified: Secondary | ICD-10-CM | POA: Diagnosis not present

## 2023-07-21 DIAGNOSIS — S06310A Contusion and laceration of right cerebrum without loss of consciousness, initial encounter: Secondary | ICD-10-CM | POA: Diagnosis not present

## 2023-07-21 DIAGNOSIS — R9431 Abnormal electrocardiogram [ECG] [EKG]: Secondary | ICD-10-CM | POA: Diagnosis not present

## 2023-07-21 DIAGNOSIS — I700 Atherosclerosis of aorta: Secondary | ICD-10-CM | POA: Diagnosis not present

## 2023-07-21 DIAGNOSIS — S065X0A Traumatic subdural hemorrhage without loss of consciousness, initial encounter: Principal | ICD-10-CM | POA: Insufficient documentation

## 2023-07-21 DIAGNOSIS — G20A2 Parkinson's disease without dyskinesia, with fluctuations: Secondary | ICD-10-CM

## 2023-07-21 DIAGNOSIS — Z853 Personal history of malignant neoplasm of breast: Secondary | ICD-10-CM | POA: Diagnosis not present

## 2023-07-21 DIAGNOSIS — G319 Degenerative disease of nervous system, unspecified: Secondary | ICD-10-CM | POA: Diagnosis not present

## 2023-07-21 DIAGNOSIS — M1711 Unilateral primary osteoarthritis, right knee: Secondary | ICD-10-CM | POA: Diagnosis not present

## 2023-07-21 DIAGNOSIS — S065XAA Traumatic subdural hemorrhage with loss of consciousness status unknown, initial encounter: Principal | ICD-10-CM | POA: Diagnosis present

## 2023-07-21 DIAGNOSIS — M79604 Pain in right leg: Secondary | ICD-10-CM | POA: Diagnosis not present

## 2023-07-21 DIAGNOSIS — G20C Parkinsonism, unspecified: Secondary | ICD-10-CM | POA: Insufficient documentation

## 2023-07-21 DIAGNOSIS — W01198A Fall on same level from slipping, tripping and stumbling with subsequent striking against other object, initial encounter: Secondary | ICD-10-CM | POA: Insufficient documentation

## 2023-07-21 DIAGNOSIS — Z9682 Presence of neurostimulator: Secondary | ICD-10-CM | POA: Diagnosis not present

## 2023-07-21 DIAGNOSIS — S72002A Fracture of unspecified part of neck of left femur, initial encounter for closed fracture: Secondary | ICD-10-CM | POA: Diagnosis not present

## 2023-07-21 DIAGNOSIS — R8281 Pyuria: Secondary | ICD-10-CM | POA: Insufficient documentation

## 2023-07-21 DIAGNOSIS — M7731 Calcaneal spur, right foot: Secondary | ICD-10-CM | POA: Diagnosis not present

## 2023-07-21 DIAGNOSIS — M25522 Pain in left elbow: Secondary | ICD-10-CM | POA: Diagnosis not present

## 2023-07-21 DIAGNOSIS — S0990XA Unspecified injury of head, initial encounter: Secondary | ICD-10-CM | POA: Diagnosis present

## 2023-07-21 LAB — URINALYSIS, ROUTINE W REFLEX MICROSCOPIC
Bacteria, UA: NONE SEEN
Bilirubin Urine: NEGATIVE
Glucose, UA: NEGATIVE mg/dL
Hgb urine dipstick: NEGATIVE
Ketones, ur: NEGATIVE mg/dL
Nitrite: NEGATIVE
Protein, ur: 30 mg/dL — AB
Specific Gravity, Urine: 1.013 (ref 1.005–1.030)
pH: 8 (ref 5.0–8.0)

## 2023-07-21 LAB — COMPREHENSIVE METABOLIC PANEL
ALT: 6 U/L (ref 0–44)
AST: 19 U/L (ref 15–41)
Albumin: 4 g/dL (ref 3.5–5.0)
Alkaline Phosphatase: 65 U/L (ref 38–126)
Anion gap: 10 (ref 5–15)
BUN: 14 mg/dL (ref 8–23)
CO2: 27 mmol/L (ref 22–32)
Calcium: 9.6 mg/dL (ref 8.9–10.3)
Chloride: 103 mmol/L (ref 98–111)
Creatinine, Ser: 0.63 mg/dL (ref 0.44–1.00)
GFR, Estimated: >60 mL/min (ref >60)
Glucose, Bld: 110 mg/dL — ABNORMAL HIGH (ref 70–99)
Potassium: 3.7 mmol/L (ref 3.5–5.1)
Sodium: 140 mmol/L (ref 135–145)
Total Bilirubin: 0.5 mg/dL (ref <1.2)
Total Protein: 7 g/dL (ref 6.5–8.1)

## 2023-07-21 LAB — CBC
HCT: 35.4 % — ABNORMAL LOW (ref 36.0–46.0)
Hemoglobin: 11.3 g/dL — ABNORMAL LOW (ref 12.0–15.0)
MCH: 31 pg (ref 26.0–34.0)
MCHC: 31.9 g/dL (ref 30.0–36.0)
MCV: 97 fL (ref 80.0–100.0)
Platelets: 189 10*3/uL (ref 150–400)
RBC: 3.65 MIL/uL — ABNORMAL LOW (ref 3.87–5.11)
RDW: 13.8 % (ref 11.5–15.5)
WBC: 4.9 10*3/uL (ref 4.0–10.5)
nRBC: 0 % (ref 0.0–0.2)

## 2023-07-21 LAB — MRSA NEXT GEN BY PCR, NASAL: MRSA by PCR Next Gen: NOT DETECTED

## 2023-07-21 LAB — CK: Total CK: 80 U/L (ref 38–234)

## 2023-07-21 MED ORDER — POLYETHYLENE GLYCOL 3350 17 G PO PACK: 17.0000 g | PACK | Freq: Every day | ORAL | Status: DC | PRN

## 2023-07-21 MED ORDER — ACETAMINOPHEN 325 MG PO TABS: 650.0000 mg | ORAL_TABLET | Freq: Four times a day (QID) | ORAL | Status: DC | PRN

## 2023-07-21 MED ORDER — CHLORHEXIDINE GLUCONATE CLOTH 2 % EX PADS
6.0000 | MEDICATED_PAD | Freq: Every day | CUTANEOUS | Status: DC
  Administered 2023-07-22: 6 via TOPICAL

## 2023-07-21 MED ORDER — IOHEXOL 300 MG/ML  SOLN
100.0000 mL | Freq: Once | INTRAMUSCULAR | Status: AC | PRN
  Administered 2023-07-21: 100 mL via INTRAVENOUS

## 2023-07-21 MED ORDER — ACETAMINOPHEN 325 MG PO TABS
650.0000 mg | ORAL_TABLET | Freq: Once | ORAL | Status: AC
  Administered 2023-07-21: 650 mg via ORAL
  Filled 2023-07-21: qty 2

## 2023-07-21 MED ORDER — PROMETHAZINE HCL 12.5 MG PO TABS: 12.5000 mg | ORAL_TABLET | Freq: Four times a day (QID) | ORAL | Status: DC | PRN

## 2023-07-21 MED ORDER — SODIUM CHLORIDE 0.9 % IV SOLN
1.0000 g | Freq: Once | INTRAVENOUS | Status: AC
  Administered 2023-07-21: 1 g via INTRAVENOUS
  Filled 2023-07-21: qty 10

## 2023-07-21 MED ORDER — SODIUM CHLORIDE 0.9 % IV SOLN: 1.0000 g | INTRAVENOUS | Status: DC

## 2023-07-21 MED ORDER — ACETAMINOPHEN 650 MG RE SUPP: 650.0000 mg | Freq: Four times a day (QID) | RECTAL | Status: DC | PRN

## 2023-07-21 MED ORDER — LEVETIRACETAM IN NACL 500 MG/100ML IV SOLN
500.0000 mg | Freq: Once | INTRAVENOUS | Status: AC
  Administered 2023-07-21: 500 mg via INTRAVENOUS
  Filled 2023-07-21: qty 100

## 2023-07-21 NOTE — Progress Notes (Addendum)
Pt presented to ED after a fall last night. Maintained on Plavix, h/o CVA. No focal deficits per EDP. 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 Recommend repeat CTH in 8H, Keppra 500mg  BIDx7d, hold anticoagulation. Call w/ questions/concerns.   Pavel Gadd Margaree Mackintosh, PA-C

## 2023-07-21 NOTE — Assessment & Plan Note (Signed)
With gait instability, multiple falls. -Resume Sinemet

## 2023-07-21 NOTE — H&P (Signed)
History and Physical    Tracey Juarez AVW:[id number] DOB: 20-Sep-1940 DOA: 07/21/2023  PCP: Dettinger, Elige Radon, MD   Patient coming from: Home  I have personally briefly reviewed patient's old medical records in [facility]  Chief Complaint: Falls  HPI: Tracey Juarez is a 82 y.o. female with medical history significant for breast cancer, hypertension, CVA, Parkinson's disease. Patient presented to the ED with complaints of falls, she reports 3 falls over the past 3 days.  She reports her last fall was about 5 PM yesterday when she tripped over an object and fell.  She reports she is unsteady on her feet.  Is supposed to use a walker or a cane, but she was not using either of them when she fell.  She reports a headache. She denies dizziness, no chest pain., no difficulty breathing, no vomiting no diarrhea she has had good oral intake.  She lives with her husband.  ED Course: Stable vitals.   Head CT without contrast-acute subdural hematoma along the tentorium on the left maximal thickness 2.2 mm.  No mass effect upon the brain. CT abdomen chest and pelvis without contrast without acute abnormalities. Eye: Maxillofacial CT without acute abnormalities. EDP talked to neurosurgery on call -PA Esperanza Richters, okay to stay here at [facility], repeat head CT in 8 hours, hold anticoagulation, start Keppra 500 twice daily for 7 days.  Review of Systems: As per HPI all other systems reviewed and negative.  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   Detrusor instability    Elevated cholesterol    GERD (gastroesophageal reflux disease)    Hot flashes    Humerus head fracture, left, with routine healing, subsequent encounter 2000   Hypertension    Melanoma (HCC)    Mixed basal-squamous cell carcinoma    skin   Parkinson disease (HCC)    Personal  history of radiation therapy    Stroke (HCC)    on Plavix, no deficits    Past Surgical History:  Procedure Laterality Date   BRAIN SURGERY     BREAST LUMPECTOMY Left 04/2015   radiation   BREAST LUMPECTOMY WITH RADIOACTIVE SEED AND SENTINEL LYMPH NODE BIOPSY Left 04/25/2015   Procedure: LEFT BREAST PARTIAL MASTECTOMY WITH RADIOACTIVE SEED AND SENTINEL LYMPH NODE MAPPING;  Surgeon: Harriette Bouillon, MD;  Location: [facility];  Service: General;  Laterality: Left;   FOOT SURGERY  2004   KNEE SURGERY  2007   skin cancer excised     TONSILLECTOMY       reports that she has never smoked. She has never used smokeless tobacco. She reports that she does not drink alcohol and does not use drugs.  Allergies  Allergen Reactions   Cat Hair Extract    Ondansetron Swelling and Palpitations    Family History  Problem Relation Age of Onset   Hypertension Mother    Heart attack Mother    Heart attack Maternal Aunt    Heart attack Maternal Uncle    Breast cancer Neg Hx     Prior to Admission medications   Medication Sig Start Date End Date Taking? Authorizing Provider  acetaminophen (TYLENOL) 325 MG tablet Take 650 mg by mouth every 6 (six) hours as needed.   Yes [provider]  Carbidopa-Levodopa ER (SINEMET CR) 25-100 MG tablet controlled release Take  1 tablet by mouth 3 (three) times daily. 01/12/19  Yes [provider]  cetirizine (ZYRTEC) 10 MG tablet Take 10 mg by mouth daily.   Yes [provider]  clopidogrel (PLAVIX) 75 MG tablet Take 75 mg by mouth daily.   Yes [provider]  famotidine (PEPCID) 10 MG tablet Take 10 mg by mouth daily.   Yes [provider]  feeding supplement (BOOST HIGH PROTEIN) LIQD Take 1 Container by mouth 3 (three) times daily between meals.   Yes [provider]  fluticasone (FLONASE) 50 MCG/ACT nasal spray USE 1 SPRAY(S) IN EACH NOSTRIL TWICE DAILY AS NEEDED FOR ALLERGIES OR RHINITIS 09/09/21   Yes Dettinger, Elige Radon, MD  lisinopril (ZESTRIL) 5 MG tablet Take 1 tablet (5 mg total) by mouth daily. Patient taking differently: Take 2.5 mg by mouth daily. Takes few times per week 09/22/22  Yes Dettinger, Elige Radon, MD  meclizine (ANTIVERT) 12.5 MG tablet TAKE 1 TO 2 TABLETS BY MOUTH THREE TIMES DAILY AS NEEDED FOR DIZZINESS 02/02/23  Yes Dettinger, Elige Radon, MD  oxybutynin (DITROPAN-XL) 10 MG 24 hr tablet Take 1 tablet (10 mg total) by mouth at bedtime. 09/22/22  Yes Dettinger, Elige Radon, MD  rosuvastatin (CRESTOR) 10 MG tablet Take 1 tablet (10 mg total) by mouth daily. 09/22/22  Yes Dettinger, Elige Radon, MD  Cholecalciferol (VITAMIN D3) 1.25 MG (50000 UT) TABS Take by mouth. Taking 4,000 ui daily Patient not taking: Reported on 07/21/2023    [provider]  Multiple Vitamin (MULTIVITAMIN) capsule Take 1 capsule by mouth daily. Patient not taking: Reported on 07/21/2023    [provider]    Physical Exam: Vitals:   07/21/23 0901 07/21/23 0914 07/21/23 0916 07/21/23 1515  BP: 120/74   (!) 125/58  Pulse: 71   63  Resp:   16 20  Temp: 97.6 F (36.4 C)     TempSrc: Oral     SpO2: 97%   99%  Weight:  52.2 kg    Height:  5\' 2"  (1.575 m)      Constitutional: NAD, calm, comfortable Vitals:   07/21/23 0901 07/21/23 0914 07/21/23 0916 07/21/23 1515  BP: 120/74   (!) 125/58  Pulse: 71   63  Resp:   16 20  Temp: 97.6 F (36.4 C)     TempSrc: Oral     SpO2: 97%   99%  Weight:  52.2 kg    Height:  5\' 2"  (1.575 m)     Eyes: PERRL, lids and conjunctivae normal ENMT: Mucous membranes are moist. Posterior pharynx clear of any exudate or lesions.Normal dentition.  Neck: normal, supple, no masses, no thyromegaly Respiratory: clear to auscultation bilaterally, no wheezing, no crackles. Normal respiratory effort. No accessory muscle use.  Cardiovascular: Regular rate and rhythm, no murmurs / rubs / gallops. No extremity edema.  Extremities warm.  Abdomen: no tenderness, no  masses palpated. No hepatosplenomegaly. Bowel sounds positive.  Musculoskeletal: no clubbing / cyanosis. No joint deformity upper and lower extremities. Good ROM, no contractures. Normal muscle tone.  Skin: Multiple ecchymotic areas, bruising -upper extremities, face, Neurologic: No facial symmetry, moving extremities spontaneously, speech fluent. Psychiatric: Normal judgment and insight. Alert and oriented x 3. Normal mood.   Labs on Admission: I have personally reviewed following labs and imaging studies  CBC: Recent Labs  Lab 07/21/23 1006  WBC 4.9  HGB 11.3*  HCT 35.4*  MCV 97.0  PLT 189   Basic Metabolic Panel: Recent Labs  Lab 07/21/23 1137  NA 140  K 3.7  CL 103  CO2 27  GLUCOSE 110*  BUN 14  CREATININE 0.63  CALCIUM 9.6   GFR: Estimated Creatinine Clearance: 43.6 mL/min (by C-G formula based on SCr of 0.63 mg/dL). Liver Function Tests: Recent Labs  Lab 07/21/23 1137  AST 19  ALT 6  ALKPHOS 65  BILITOT 0.5  PROT 7.0  ALBUMIN 4.0   Cardiac Enzymes: Recent Labs  Lab 07/21/23 1137  CKTOTAL 80   Urine analysis:    Component Value Date/Time   COLORURINE YELLOW 07/21/2023 1100   APPEARANCEUR CLOUDY (A) 07/21/2023 1100   APPEARANCEUR Clear 09/15/2022 0823   LABSPEC 1.013 07/21/2023 1100   PHURINE 8.0 07/21/2023 1100   GLUCOSEU NEGATIVE 07/21/2023 1100   HGBUR NEGATIVE 07/21/2023 1100   BILIRUBINUR NEGATIVE 07/21/2023 1100   BILIRUBINUR Negative 09/15/2022 0823   KETONESUR NEGATIVE 07/21/2023 1100   PROTEINUR 30 (A) 07/21/2023 1100   UROBILINOGEN 0.2 07/27/2014 1524   NITRITE NEGATIVE 07/21/2023 1100   LEUKOCYTESUR LARGE (A) 07/21/2023 1100    Radiological Exams on Admission: CT CHEST ABDOMEN PELVIS W CONTRAST  Result Date: 07/21/2023 CLINICAL DATA:  Multiple falls EXAM: CT CHEST, ABDOMEN, AND PELVIS WITH CONTRAST TECHNIQUE: Multidetector CT imaging of the chest, abdomen and pelvis was performed following the standard protocol during bolus  administration of intravenous contrast. RADIATION DOSE REDUCTION: This exam was performed according to the departmental dose-optimization program which includes automated exposure control, adjustment of the mA and/or kV according to patient size and/or use of iterative reconstruction technique. CONTRAST:  OMNIPAQUE IOHEXOL 300 MG/ML  SOLN COMPARISON:  10/08/2020 FINDINGS: CT CHEST FINDINGS Cardiovascular: Heart size normal. No pericardial effusion. Scattered coronary calcifications. Scattered aortic calcifications without aneurysm. Mediastinum/Nodes: No mass, hematoma, or adenopathy. Lungs/Pleura: No pleural effusion. No pneumothorax. Mild dependent atelectasis in the lung bases. Lungs otherwise clear. Musculoskeletal: Implanted subcutaneous generator left anterior chest, cephalad extent of catheter not visualized. Mild thoracic kyphosis without acute finding. CT ABDOMEN PELVIS FINDINGS Hepatobiliary: No focal liver abnormality is seen. No gallstones, gallbladder wall thickening, or biliary dilatation. Pancreas: Chronic mild pancreatic ductal dilatation up to 3 mm, seen down to the ampulla. No mass, parenchymal atrophy, regional inflammatory change. Spleen: Normal in size without focal abnormality. Adrenals/Urinary Tract: No adrenal mass. No urolithiasis or hydronephrosis. Multiple cortical lesions in both kidneys, some of which can be characterized as cysts, largest 1.9 cm 9 HU mid right kidney; no followup recommended. Urinary bladder incompletely distended. Stomach/Bowel: Stomach is incompletely distended, without acute finding. Small bowel decompressed. Appendix not discretely identified. The colon is partially distended, without acute finding. Vascular/Lymphatic: Scattered aortoiliac calcified atheromatous plaque without AAA. Portal vein patent. No abdominal or pelvic adenopathy. Reproductive: Chronic partially calcified left uterine fibroid. No adnexal mass. Other: No ascites.  No free air.  Musculoskeletal: Degenerative disc disease L4-S1. Facet DJD L3-S1, allowing grade 1 anterolisthesis L4-5 as before. Interval fixation of proximal left femur, mid cervical fracture line still evident. No acute fracture. IMPRESSION: 1. No acute findings. 2. Interval fixation of proximal left femur fracture. 3.  Aortic Atherosclerosis (ICD10-I70.0). Electronically Signed   By: Corlis Leak M.D.   On: 07/21/2023 14:11   CT Head Wo Contrast  Result Date: 07/21/2023 CLINICAL DATA:  Multiple recent falls. EXAM: CT HEAD WITHOUT CONTRAST CT MAXILLOFACIAL WITHOUT CONTRAST CT CERVICAL SPINE WITHOUT CONTRAST TECHNIQUE: Multidetector CT imaging of the head, cervical spine, and maxillofacial structures were performed using the standard protocol without intravenous contrast. Multiplanar CT image reconstructions  of the cervical spine and maxillofacial structures were also generated. RADIATION DOSE REDUCTION: This exam was performed according to the departmental dose-optimization program which includes automated exposure control, adjustment of the mA and/or kV according to patient size and/or use of iterative reconstruction technique. COMPARISON:  None Available. FINDINGS: CT HEAD FINDINGS Brain: Thalamic neurostimulator in place on the left without complicating feature. The brain shows generalized atrophy with chronic small-vessel ischemic change of the white matter. There is an acute subdural hematoma along the tentorium on the left, maximal thickness 2.2 mm. I think this is probably along the superior surface of the tentorium. No mass-effect upon the brain. Vascular: No abnormal vascular finding. Skull: No skull fracture. Other: None CT MAXILLOFACIAL FINDINGS Osseous: No facial fracture. Orbits: No soft tissue orbital injury. Sinuses: Mild seasonal mucosal inflammatory changes. No advanced sinusitis. No post traumatic fluid. Soft tissues: Negative CT CERVICAL SPINE FINDINGS Alignment: No traumatic malalignment. Skull base and  vertebrae: No regional fracture or focal bone lesion. Soft tissues and spinal canal: No traumatic soft tissue finding. Enlarged heterogeneous thyroid gland. Largest visible nodule 2 cm in diameter. Thyroid ultrasound recommended if not previously performed. Disc levels: Ordinary mild cervical spondylosis. Small endplate osteophytes at C5-6. Mild midcervical facet osteoarthritis. No likely compressive canal or foraminal stenosis. Upper chest: Negative other than simple appearing pleural and parenchymal scarring. Other: None IMPRESSION: HEAD CT: 1. Acute subdural hematoma along the tentorium on the left, maximal thickness 2.2 mm. No mass-effect upon the brain. 2. Atrophy and chronic small-vessel ischemic changes of the white matter. 3. Thalamic neurostimulator in place on the left without complicating feature. MAXILLOFACIAL CT: No facial fracture. CERVICAL SPINE CT: 1. No acute or traumatic finding. 2. Enlarged heterogeneous thyroid gland. Largest visible nodule 2 cm in diameter. Thyroid ultrasound recommended if not previously performed. Critical Value/emergent results were called by telephone at the time of interpretation on 07/21/2023 at 12:52 pm to provider EDP, who verbally acknowledged these results. Electronically Signed   By: Paulina Fusi M.D.   On: 07/21/2023 12:52   CT Cervical Spine Wo Contrast  Result Date: 07/21/2023 CLINICAL DATA:  Multiple recent falls. EXAM: CT HEAD WITHOUT CONTRAST CT MAXILLOFACIAL WITHOUT CONTRAST CT CERVICAL SPINE WITHOUT CONTRAST TECHNIQUE: Multidetector CT imaging of the head, cervical spine, and maxillofacial structures were performed using the standard protocol without intravenous contrast. Multiplanar CT image reconstructions of the cervical spine and maxillofacial structures were also generated. RADIATION DOSE REDUCTION: This exam was performed according to the departmental dose-optimization program which includes automated exposure control, adjustment of the mA and/or kV  according to patient size and/or use of iterative reconstruction technique. COMPARISON:  None Available. FINDINGS: CT HEAD FINDINGS Brain: Thalamic neurostimulator in place on the left without complicating feature. The brain shows generalized atrophy with chronic small-vessel ischemic change of the white matter. There is an acute subdural hematoma along the tentorium on the left, maximal thickness 2.2 mm. I think this is probably along the superior surface of the tentorium. No mass-effect upon the brain. Vascular: No abnormal vascular finding. Skull: No skull fracture. Other: None CT MAXILLOFACIAL FINDINGS Osseous: No facial fracture. Orbits: No soft tissue orbital injury. Sinuses: Mild seasonal mucosal inflammatory changes. No advanced sinusitis. No post traumatic fluid. Soft tissues: Negative CT CERVICAL SPINE FINDINGS Alignment: No traumatic malalignment. Skull base and vertebrae: No regional fracture or focal bone lesion. Soft tissues and spinal canal: No traumatic soft tissue finding. Enlarged heterogeneous thyroid gland. Largest visible nodule 2 cm in diameter. Thyroid ultrasound recommended if  not previously performed. Disc levels: Ordinary mild cervical spondylosis. Small endplate osteophytes at C5-6. Mild midcervical facet osteoarthritis. No likely compressive canal or foraminal stenosis. Upper chest: Negative other than simple appearing pleural and parenchymal scarring. Other: None IMPRESSION: HEAD CT: 1. Acute subdural hematoma along the tentorium on the left, maximal thickness 2.2 mm. No mass-effect upon the brain. 2. Atrophy and chronic small-vessel ischemic changes of the white matter. 3. Thalamic neurostimulator in place on the left without complicating feature. MAXILLOFACIAL CT: No facial fracture. CERVICAL SPINE CT: 1. No acute or traumatic finding. 2. Enlarged heterogeneous thyroid gland. Largest visible nodule 2 cm in diameter. Thyroid ultrasound recommended if not previously performed. Critical  Value/emergent results were called by telephone at the time of interpretation on 07/21/2023 at 12:52 pm to provider EDP, who verbally acknowledged these results. Electronically Signed   By: Paulina Fusi M.D.   On: 07/21/2023 12:52   CT Maxillofacial WO CM  Result Date: 07/21/2023 CLINICAL DATA:  Multiple recent falls. EXAM: CT HEAD WITHOUT CONTRAST CT MAXILLOFACIAL WITHOUT CONTRAST CT CERVICAL SPINE WITHOUT CONTRAST TECHNIQUE: Multidetector CT imaging of the head, cervical spine, and maxillofacial structures were performed using the standard protocol without intravenous contrast. Multiplanar CT image reconstructions of the cervical spine and maxillofacial structures were also generated. RADIATION DOSE REDUCTION: This exam was performed according to the departmental dose-optimization program which includes automated exposure control, adjustment of the mA and/or kV according to patient size and/or use of iterative reconstruction technique. COMPARISON:  None Available. FINDINGS: CT HEAD FINDINGS Brain: Thalamic neurostimulator in place on the left without complicating feature. The brain shows generalized atrophy with chronic small-vessel ischemic change of the white matter. There is an acute subdural hematoma along the tentorium on the left, maximal thickness 2.2 mm. I think this is probably along the superior surface of the tentorium. No mass-effect upon the brain. Vascular: No abnormal vascular finding. Skull: No skull fracture. Other: None CT MAXILLOFACIAL FINDINGS Osseous: No facial fracture. Orbits: No soft tissue orbital injury. Sinuses: Mild seasonal mucosal inflammatory changes. No advanced sinusitis. No post traumatic fluid. Soft tissues: Negative CT CERVICAL SPINE FINDINGS Alignment: No traumatic malalignment. Skull base and vertebrae: No regional fracture or focal bone lesion. Soft tissues and spinal canal: No traumatic soft tissue finding. Enlarged heterogeneous thyroid gland. Largest visible nodule 2  cm in diameter. Thyroid ultrasound recommended if not previously performed. Disc levels: Ordinary mild cervical spondylosis. Small endplate osteophytes at C5-6. Mild midcervical facet osteoarthritis. No likely compressive canal or foraminal stenosis. Upper chest: Negative other than simple appearing pleural and parenchymal scarring. Other: None IMPRESSION: HEAD CT: 1. Acute subdural hematoma along the tentorium on the left, maximal thickness 2.2 mm. No mass-effect upon the brain. 2. Atrophy and chronic small-vessel ischemic changes of the white matter. 3. Thalamic neurostimulator in place on the left without complicating feature. MAXILLOFACIAL CT: No facial fracture. CERVICAL SPINE CT: 1. No acute or traumatic finding. 2. Enlarged heterogeneous thyroid gland. Largest visible nodule 2 cm in diameter. Thyroid ultrasound recommended if not previously performed. Critical Value/emergent results were called by telephone at the time of interpretation on 07/21/2023 at 12:52 pm to provider EDP, who verbally acknowledged these results. Electronically Signed   By: Paulina Fusi M.D.   On: 07/21/2023 12:52   DG Tibia/Fibula Right  Result Date: 07/21/2023 CLINICAL DATA:  Fall.  Right leg pain. EXAM: RIGHT TIBIA AND FIBULA - 2 VIEW COMPARISON:  None Available. FINDINGS: No acute fracture or dislocation. No aggressive osseous lesion. Mild to  moderate degenerative changes of the imaged joints. Ankle mortise appears intact. Calcaneal spur noted along the Achilles tendon and Plantar aponeurosis attachment sites. No focal soft tissue swelling. No radiopaque foreign bodies. IMPRESSION: *No acute osseous abnormality of the right leg. Electronically Signed   By: Jules Schick M.D.   On: 07/21/2023 11:06   DG ELBOW COMPLETE LEFT (3+VIEW)  Result Date: 07/21/2023 CLINICAL DATA:  Fall.  Elbow pain. EXAM: LEFT ELBOW - COMPLETE 3+ VIEW COMPARISON:  None Available. FINDINGS: No acute fracture or dislocation. No aggressive osseous  lesion. No significant elbow joint arthritis. No radiopaque foreign bodies. Soft tissues are within normal limits. IMPRESSION: *No acute osseous abnormality of the left elbow joint. Electronically Signed   By: Jules Schick M.D.   On: 07/21/2023 11:04    EKG: Independently reviewed.  Sinus rhythm, rate 71, QTc 446.  No significant change from prior.  Assessment/Plan Principal Problem:   Acute subdural hematoma (HCC) Active Problems:   Falls   Hypertension   Parkinson disease (HCC)  Assessment and Plan: * Acute subdural hematoma (HCC) Reports multiple falls.  Head CT- acute subdural hematoma along the tentorium on the left, maximal thickness 2.2 mm. No mass-effect upon the brain. - EDP talked to neurosurgery on call -PA Esperanza Richters, okay to stay here at [facility], repeat head CT in 8 hours,, start Keppra 500 twice daily for 7 days. -Hold Plavix   Falls Multiple falls.  Likely related to Parkinson's, and increasing frailty.  She is supposed to use a walker/cane, she did not use when she fell.   -PT eval -UA obtained in the ED-with large leukocytes, and subsequent urine cultures, continue ceftriaxone for now.  Parkinson disease (HCC) With gait instability, multiple falls. -Resume Sinemet  Hypertension Stable. -Resume lisinopril   DVT prophylaxis: SCDs Code Status: FULL code-confirmed with patient at bedside. Family Communication: None at bedside Disposition Plan: ~ 1-2 days Consults called: Neurosurgery Admission status: inpt Stepdown I certify that at the point of admission it is my clinical judgment that the patient will require inpatient hospital care spanning beyond 2 midnights from the point of admission due to high intensity of service, high risk for further deterioration and high frequency of surveillance required.  Author: Onnie Boer, MD 07/21/2023 7:04 PM  For on call review [email or web address].

## 2023-07-21 NOTE — Assessment & Plan Note (Addendum)
Reports multiple falls.  Head CT- acute subdural hematoma along the tentorium on the left, maximal thickness 2.2 mm. No mass-effect upon the brain. - EDP talked to neurosurgery on call -PA Esperanza Richters, okay to stay here at [facility], repeat head CT in 8 hours,, start Keppra 500 twice daily for 7 days. -Hold Plavix

## 2023-07-21 NOTE — Assessment & Plan Note (Signed)
Stable. -Resume lisinopril

## 2023-07-21 NOTE — ED Notes (Signed)
Family is taking money home to husband. Watch, 2 pair of glasses, pocket book , cane, cell phone with clothes staying with pt.

## 2023-07-21 NOTE — ED Provider Notes (Signed)
[place] EMERGENCY DEPARTMENT AT [facility] Provider Note   CSN: [id number] Arrival date & time: 07/21/23  4696     History  Chief Complaint  Patient presents with   [facility] Tracey Juarez is a 82 y.o. female.   Fall   82 year old female presents emergency department after a fall.  Fall occurred yesterday evening when she was walking to her kitchen.  Reports narrow passage and she felt like she tripped falling landing on her left side.  Reports trauma to the head but denies loss of consciousness.  Patient does states she has a history of Parkinson's and has difficulty walking at baseline.  States she had deep brain stimulator placed in 2020 which she states has helped with her gait.  States has had 3 falls in the past 3 days.  Regarding most recent fall, states that she called her neighbor who is over an amount of minutes to help her up.  Currently complaining of headache, hip pain, right lower leg pain, left elbow pain.  Denies blood in her use.  Denies any visual disturbance, gait abnormality from baseline, weakness/sensory deficits in the lower extremities from baseline, slurred speech, facial droop.  Denies any chest pain, shortness of breath, abdominal pain, nausea, vomiting.  Has been able to ambulate since then but with some discomfort.  Patient lives at home with her husband.  Past medical history significant for CVA, Parkinson's disease, GERD, hypertension, melanoma, breast cancer  Home Medications Prior to Admission medications   Medication Sig Start Date End Date Taking? Authorizing Provider  acetaminophen (TYLENOL) 325 MG tablet Take 650 mg by mouth every 6 (six) hours as needed.   Yes [provider]  Carbidopa-Levodopa ER (SINEMET CR) 25-100 MG tablet controlled release Take 1 tablet by mouth 3 (three) times daily. 01/12/19  Yes [provider]  cetirizine (ZYRTEC) 10 MG tablet Take 10 mg by mouth daily.   Yes [provider]   clopidogrel (PLAVIX) 75 MG tablet Take 75 mg by mouth daily.   Yes [provider]  famotidine (PEPCID) 10 MG tablet Take 10 mg by mouth daily.   Yes [provider]  feeding supplement (BOOST HIGH PROTEIN) LIQD Take 1 Container by mouth 3 (three) times daily between meals.   Yes [provider]  fluticasone (FLONASE) 50 MCG/ACT nasal spray USE 1 SPRAY(S) IN EACH NOSTRIL TWICE DAILY AS NEEDED FOR ALLERGIES OR RHINITIS 09/09/21  Yes Dettinger, Elige Radon, MD  lisinopril (ZESTRIL) 5 MG tablet Take 1 tablet (5 mg total) by mouth daily. Patient taking differently: Take 2.5 mg by mouth daily. Takes few times per week 09/22/22  Yes Dettinger, Elige Radon, MD  meclizine (ANTIVERT) 12.5 MG tablet TAKE 1 TO 2 TABLETS BY MOUTH THREE TIMES DAILY AS NEEDED FOR DIZZINESS 02/02/23  Yes Dettinger, Elige Radon, MD  oxybutynin (DITROPAN-XL) 10 MG 24 hr tablet Take 1 tablet (10 mg total) by mouth at bedtime. 09/22/22  Yes Dettinger, Elige Radon, MD  rosuvastatin (CRESTOR) 10 MG tablet Take 1 tablet (10 mg total) by mouth daily. 09/22/22  Yes Dettinger, Elige Radon, MD  Cholecalciferol (VITAMIN D3) 1.25 MG (50000 UT) TABS Take by mouth. Taking 4,000 ui daily Patient not taking: Reported on 07/21/2023    [provider]  Multiple Vitamin (MULTIVITAMIN) capsule Take 1 capsule by mouth daily. Patient not taking: Reported on 07/21/2023    [provider]      Allergies    Cat hair extract and  Ondansetron    Review of Systems   Review of Systems  All other systems reviewed and are negative.   Physical Exam Updated Vital Signs BP (!) 125/58   Pulse 63   Temp 97.6 F (36.4 C) (Oral)   Resp 20   Ht 5\' 2"  (1.575 m)   Wt 52.2 kg   SpO2 99%   BMI 21.03 kg/m  Physical Exam Vitals and nursing note reviewed.  Constitutional:      General: She is not in acute distress.    Appearance: She is well-developed.  HENT:     Head: Normocephalic.     Comments: Patient with multiple areas  of very staged healing ecchymosis appreciated on face/scalp. Eyes:     Conjunctiva/sclera: Conjunctivae normal.  Cardiovascular:     Rate and Rhythm: Normal rate and regular rhythm.  Pulmonary:     Effort: Pulmonary effort is normal. No respiratory distress.     Breath sounds: Normal breath sounds. No wheezing, rhonchi or rales.     Comments: Left lateral chest wall tenderness.  No obvious overlying bruising, step-off, deformity. Chest:     Chest wall: Tenderness present.  Abdominal:     Palpations: Abdomen is soft.     Tenderness: There is no abdominal tenderness.  Musculoskeletal:        General: No swelling.     Cervical back: Neck supple.     Comments: No midline tenderness of cervical, thoracic, lumbar spine without step-off or deformity.  Left paraspinal/tenderness to palpation in the lumbar region.  Patient with tenderness over left olecranon process as well as distal right tibia.  Otherwise, upper and lower extremities without tenderness to palpation.  Skin:    General: Skin is warm and dry.     Capillary Refill: Capillary refill takes less than 2 seconds.  Neurological:     Mental Status: She is alert.     Comments: Alert and oriented to self, place, time and event.   Speech is fluent, clear without dysarthria or dysphasia.   Strength symmetric in upper/lower extremities   Sensation intact in upper/lower extremities   CN I not tested  CN II grossly intact visual fields bilaterally. Did not visualize posterior eye.  CN III, IV, VI PERRLA and EOMs intact bilaterally  CN V Intact sensation to sharp and light touch to the face  CN VII facial movements symmetric  CN VIII not tested  CN IX, X no uvula deviation, symmetric rise of soft palate  CN XI symmetric SCM and trapezius strength bilaterally  CN XII Midline tongue protrusion, symmetric L/R movements     Psychiatric:        Mood and Affect: Mood normal.     ED Results / Procedures / Treatments   Labs (all labs  ordered are listed, but only abnormal results are displayed) Labs Reviewed  CBC - Abnormal; Notable for the following components:      Result Value   RBC 3.65 (*)    Hemoglobin 11.3 (*)    HCT 35.4 (*)    All other components within normal limits  URINALYSIS, ROUTINE W REFLEX MICROSCOPIC - Abnormal; Notable for the following components:   APPearance CLOUDY (*)    Protein, ur 30 (*)    Leukocytes,Ua LARGE (*)    All other components within normal limits  COMPREHENSIVE METABOLIC PANEL - Abnormal; Notable for the following components:   Glucose, Bld 110 (*)    All other components within normal limits  URINE CULTURE  CK    EKG EKG Interpretation Date/Time:  Tuesday July 21 2023 09:48:20 EST Ventricular Rate:  71 PR Interval:  160 QRS Duration:  254 QT Interval:  410 QTC Calculation: 446 R Axis:   8  Text Interpretation: Sinus rhythm Prominent P waves, nondiagnostic Artifact in lead(s) I II aVR aVL aVF V1 V2 V3 V4 V5 V6 Confirmed by Vivi Barrack [phone]) on 07/21/2023 12:26:38 PM  Radiology CT CHEST ABDOMEN PELVIS W CONTRAST  Result Date: 07/21/2023 CLINICAL DATA:  Multiple falls EXAM: CT CHEST, ABDOMEN, AND PELVIS WITH CONTRAST TECHNIQUE: Multidetector CT imaging of the chest, abdomen and pelvis was performed following the standard protocol during bolus administration of intravenous contrast. RADIATION DOSE REDUCTION: This exam was performed according to the departmental dose-optimization program which includes automated exposure control, adjustment of the mA and/or kV according to patient size and/or use of iterative reconstruction technique. CONTRAST:  OMNIPAQUE IOHEXOL 300 MG/ML  SOLN COMPARISON:  10/08/2020 FINDINGS: CT CHEST FINDINGS Cardiovascular: Heart size normal. No pericardial effusion. Scattered coronary calcifications. Scattered aortic calcifications without aneurysm. Mediastinum/Nodes: No mass, hematoma, or adenopathy. Lungs/Pleura: No pleural effusion. No  pneumothorax. Mild dependent atelectasis in the lung bases. Lungs otherwise clear. Musculoskeletal: Implanted subcutaneous generator left anterior chest, cephalad extent of catheter not visualized. Mild thoracic kyphosis without acute finding. CT ABDOMEN PELVIS FINDINGS Hepatobiliary: No focal liver abnormality is seen. No gallstones, gallbladder wall thickening, or biliary dilatation. Pancreas: Chronic mild pancreatic ductal dilatation up to 3 mm, seen down to the ampulla. No mass, parenchymal atrophy, regional inflammatory change. Spleen: Normal in size without focal abnormality. Adrenals/Urinary Tract: No adrenal mass. No urolithiasis or hydronephrosis. Multiple cortical lesions in both kidneys, some of which can be characterized as cysts, largest 1.9 cm 9 HU mid right kidney; no followup recommended. Urinary bladder incompletely distended. Stomach/Bowel: Stomach is incompletely distended, without acute finding. Small bowel decompressed. Appendix not discretely identified. The colon is partially distended, without acute finding. Vascular/Lymphatic: Scattered aortoiliac calcified atheromatous plaque without AAA. Portal vein patent. No abdominal or pelvic adenopathy. Reproductive: Chronic partially calcified left uterine fibroid. No adnexal mass. Other: No ascites.  No free air. Musculoskeletal: Degenerative disc disease L4-S1. Facet DJD L3-S1, allowing grade 1 anterolisthesis L4-5 as before. Interval fixation of proximal left femur, mid cervical fracture line still evident. No acute fracture. IMPRESSION: 1. No acute findings. 2. Interval fixation of proximal left femur fracture. 3.  Aortic Atherosclerosis (ICD10-I70.0). Electronically Signed   By: Corlis Leak M.D.   On: 07/21/2023 14:11   CT Head Wo Contrast  Result Date: 07/21/2023 CLINICAL DATA:  Multiple recent falls. EXAM: CT HEAD WITHOUT CONTRAST CT MAXILLOFACIAL WITHOUT CONTRAST CT CERVICAL SPINE WITHOUT CONTRAST TECHNIQUE: Multidetector CT imaging of  the head, cervical spine, and maxillofacial structures were performed using the standard protocol without intravenous contrast. Multiplanar CT image reconstructions of the cervical spine and maxillofacial structures were also generated. RADIATION DOSE REDUCTION: This exam was performed according to the departmental dose-optimization program which includes automated exposure control, adjustment of the mA and/or kV according to patient size and/or use of iterative reconstruction technique. COMPARISON:  None Available. FINDINGS: CT HEAD FINDINGS Brain: Thalamic neurostimulator in place on the left without complicating feature. The brain shows generalized atrophy with chronic small-vessel ischemic change of the white matter. There is an acute subdural hematoma along the tentorium on the left, maximal thickness 2.2 mm. I think this is probably along the superior surface of the tentorium. No mass-effect upon the brain. Vascular: No abnormal vascular finding.  Skull: No skull fracture. Other: None CT MAXILLOFACIAL FINDINGS Osseous: No facial fracture. Orbits: No soft tissue orbital injury. Sinuses: Mild seasonal mucosal inflammatory changes. No advanced sinusitis. No post traumatic fluid. Soft tissues: Negative CT CERVICAL SPINE FINDINGS Alignment: No traumatic malalignment. Skull base and vertebrae: No regional fracture or focal bone lesion. Soft tissues and spinal canal: No traumatic soft tissue finding. Enlarged heterogeneous thyroid gland. Largest visible nodule 2 cm in diameter. Thyroid ultrasound recommended if not previously performed. Disc levels: Ordinary mild cervical spondylosis. Small endplate osteophytes at C5-6. Mild midcervical facet osteoarthritis. No likely compressive canal or foraminal stenosis. Upper chest: Negative other than simple appearing pleural and parenchymal scarring. Other: None IMPRESSION: HEAD CT: 1. Acute subdural hematoma along the tentorium on the left, maximal thickness 2.2 mm. No  mass-effect upon the brain. 2. Atrophy and chronic small-vessel ischemic changes of the white matter. 3. Thalamic neurostimulator in place on the left without complicating feature. MAXILLOFACIAL CT: No facial fracture. CERVICAL SPINE CT: 1. No acute or traumatic finding. 2. Enlarged heterogeneous thyroid gland. Largest visible nodule 2 cm in diameter. Thyroid ultrasound recommended if not previously performed. Critical Value/emergent results were called by telephone at the time of interpretation on 07/21/2023 at 12:52 pm to provider EDP, who verbally acknowledged these results. Electronically Signed   By: Paulina Fusi M.D.   On: 07/21/2023 12:52   CT Cervical Spine Wo Contrast  Result Date: 07/21/2023 CLINICAL DATA:  Multiple recent falls. EXAM: CT HEAD WITHOUT CONTRAST CT MAXILLOFACIAL WITHOUT CONTRAST CT CERVICAL SPINE WITHOUT CONTRAST TECHNIQUE: Multidetector CT imaging of the head, cervical spine, and maxillofacial structures were performed using the standard protocol without intravenous contrast. Multiplanar CT image reconstructions of the cervical spine and maxillofacial structures were also generated. RADIATION DOSE REDUCTION: This exam was performed according to the departmental dose-optimization program which includes automated exposure control, adjustment of the mA and/or kV according to patient size and/or use of iterative reconstruction technique. COMPARISON:  None Available. FINDINGS: CT HEAD FINDINGS Brain: Thalamic neurostimulator in place on the left without complicating feature. The brain shows generalized atrophy with chronic small-vessel ischemic change of the white matter. There is an acute subdural hematoma along the tentorium on the left, maximal thickness 2.2 mm. I think this is probably along the superior surface of the tentorium. No mass-effect upon the brain. Vascular: No abnormal vascular finding. Skull: No skull fracture. Other: None CT MAXILLOFACIAL FINDINGS Osseous: No facial  fracture. Orbits: No soft tissue orbital injury. Sinuses: Mild seasonal mucosal inflammatory changes. No advanced sinusitis. No post traumatic fluid. Soft tissues: Negative CT CERVICAL SPINE FINDINGS Alignment: No traumatic malalignment. Skull base and vertebrae: No regional fracture or focal bone lesion. Soft tissues and spinal canal: No traumatic soft tissue finding. Enlarged heterogeneous thyroid gland. Largest visible nodule 2 cm in diameter. Thyroid ultrasound recommended if not previously performed. Disc levels: Ordinary mild cervical spondylosis. Small endplate osteophytes at C5-6. Mild midcervical facet osteoarthritis. No likely compressive canal or foraminal stenosis. Upper chest: Negative other than simple appearing pleural and parenchymal scarring. Other: None IMPRESSION: HEAD CT: 1. Acute subdural hematoma along the tentorium on the left, maximal thickness 2.2 mm. No mass-effect upon the brain. 2. Atrophy and chronic small-vessel ischemic changes of the white matter. 3. Thalamic neurostimulator in place on the left without complicating feature. MAXILLOFACIAL CT: No facial fracture. CERVICAL SPINE CT: 1. No acute or traumatic finding. 2. Enlarged heterogeneous thyroid gland. Largest visible nodule 2 cm in diameter. Thyroid ultrasound recommended if not previously performed.  Critical Value/emergent results were called by telephone at the time of interpretation on 07/21/2023 at 12:52 pm to provider EDP, who verbally acknowledged these results. Electronically Signed   By: Paulina Fusi M.D.   On: 07/21/2023 12:52   CT Maxillofacial WO CM  Result Date: 07/21/2023 CLINICAL DATA:  Multiple recent falls. EXAM: CT HEAD WITHOUT CONTRAST CT MAXILLOFACIAL WITHOUT CONTRAST CT CERVICAL SPINE WITHOUT CONTRAST TECHNIQUE: Multidetector CT imaging of the head, cervical spine, and maxillofacial structures were performed using the standard protocol without intravenous contrast. Multiplanar CT image reconstructions of  the cervical spine and maxillofacial structures were also generated. RADIATION DOSE REDUCTION: This exam was performed according to the departmental dose-optimization program which includes automated exposure control, adjustment of the mA and/or kV according to patient size and/or use of iterative reconstruction technique. COMPARISON:  None Available. FINDINGS: CT HEAD FINDINGS Brain: Thalamic neurostimulator in place on the left without complicating feature. The brain shows generalized atrophy with chronic small-vessel ischemic change of the white matter. There is an acute subdural hematoma along the tentorium on the left, maximal thickness 2.2 mm. I think this is probably along the superior surface of the tentorium. No mass-effect upon the brain. Vascular: No abnormal vascular finding. Skull: No skull fracture. Other: None CT MAXILLOFACIAL FINDINGS Osseous: No facial fracture. Orbits: No soft tissue orbital injury. Sinuses: Mild seasonal mucosal inflammatory changes. No advanced sinusitis. No post traumatic fluid. Soft tissues: Negative CT CERVICAL SPINE FINDINGS Alignment: No traumatic malalignment. Skull base and vertebrae: No regional fracture or focal bone lesion. Soft tissues and spinal canal: No traumatic soft tissue finding. Enlarged heterogeneous thyroid gland. Largest visible nodule 2 cm in diameter. Thyroid ultrasound recommended if not previously performed. Disc levels: Ordinary mild cervical spondylosis. Small endplate osteophytes at C5-6. Mild midcervical facet osteoarthritis. No likely compressive canal or foraminal stenosis. Upper chest: Negative other than simple appearing pleural and parenchymal scarring. Other: None IMPRESSION: HEAD CT: 1. Acute subdural hematoma along the tentorium on the left, maximal thickness 2.2 mm. No mass-effect upon the brain. 2. Atrophy and chronic small-vessel ischemic changes of the white matter. 3. Thalamic neurostimulator in place on the left without complicating  feature. MAXILLOFACIAL CT: No facial fracture. CERVICAL SPINE CT: 1. No acute or traumatic finding. 2. Enlarged heterogeneous thyroid gland. Largest visible nodule 2 cm in diameter. Thyroid ultrasound recommended if not previously performed. Critical Value/emergent results were called by telephone at the time of interpretation on 07/21/2023 at 12:52 pm to provider EDP, who verbally acknowledged these results. Electronically Signed   By: Paulina Fusi M.D.   On: 07/21/2023 12:52   DG Tibia/Fibula Right  Result Date: 07/21/2023 CLINICAL DATA:  Fall.  Right leg pain. EXAM: RIGHT TIBIA AND FIBULA - 2 VIEW COMPARISON:  None Available. FINDINGS: No acute fracture or dislocation. No aggressive osseous lesion. Mild to moderate degenerative changes of the imaged joints. Ankle mortise appears intact. Calcaneal spur noted along the Achilles tendon and Plantar aponeurosis attachment sites. No focal soft tissue swelling. No radiopaque foreign bodies. IMPRESSION: *No acute osseous abnormality of the right leg. Electronically Signed   By: Jules Schick M.D.   On: 07/21/2023 11:06   DG ELBOW COMPLETE LEFT (3+VIEW)  Result Date: 07/21/2023 CLINICAL DATA:  Fall.  Elbow pain. EXAM: LEFT ELBOW - COMPLETE 3+ VIEW COMPARISON:  None Available. FINDINGS: No acute fracture or dislocation. No aggressive osseous lesion. No significant elbow joint arthritis. No radiopaque foreign bodies. Soft tissues are within normal limits. IMPRESSION: *No acute osseous abnormality of  the left elbow joint. Electronically Signed   By: Jules Schick M.D.   On: 07/21/2023 11:04    Procedures .Critical Care  Performed by: Peter Garter, PA Authorized by: Peter Garter, PA   Critical care provider statement:    Critical care time (minutes):  46   Critical care was necessary to treat or prevent imminent or life-threatening deterioration of the following conditions:  CNS failure or compromise   Critical care was time spent personally by  me on the following activities:  Development of treatment plan with patient or surrogate, discussions with consultants, evaluation of patient's response to treatment, examination of patient, ordering and review of laboratory studies, ordering and review of radiographic studies, ordering and performing treatments and interventions, pulse oximetry, re-evaluation of patient's condition and review of old charts   I assumed direction of critical care for this patient from another provider in my specialty: no     Care discussed with: admitting provider       Medications Ordered in ED Medications  acetaminophen (TYLENOL) tablet 650 mg (650 mg Oral Given 07/21/23 1051)  iohexol (OMNIPAQUE) 300 MG/ML solution 100 mL (100 mLs Intravenous Contrast Given 07/21/23 1212)  levETIRAcetam (KEPPRA) IVPB 500 mg/100 mL premix (0 mg Intravenous Stopped 07/21/23 1431)  cefTRIAXone (ROCEPHIN) 1 g in sodium chloride 0.9 % 100 mL IVPB (0 g Intravenous Stopped 07/21/23 1605)    ED Course/ Medical Decision Making/ A&P Clinical Course as of 07/21/23 1717  Tue Jul 21, 2023  1347 Consulted neurosurgery Esperanza Richters regarding the patient recommended holding Plavix for the next 7 to 10 days.  Beginning Keppra 500 twice daily and admission to any pain for office.  No need for transfer to [facility] at this time.  Repeat CT head in 8 hours. [CR]    Clinical Course User Index [CR] Peter Garter, PA                                 Medical Decision Making Amount and/or Complexity of Data Reviewed Labs: ordered. Radiology: ordered.  Risk OTC drugs. Prescription drug management. Decision regarding hospitalization.   This patient presents to the ED for concern of fall, this involves an extensive number of treatment options, and is a complaint that carries with it a high risk of complications and morbidity.  The differential diagnosis includes CVA, fracture, strain/pain, dislocation, ligament/tendinous injury, neurovascular,  ice, pneumothorax, solid organ image, other   Co morbidities that complicate the patient evaluation  See HPI   Additional history obtained:  Additional history obtained from EMR External records from outside source obtained and reviewed including hospital records   Lab Tests:  I Ordered, and personally interpreted labs.  The pertinent results include: No Electra abnormalities.  No transaminitis.  No renal dysfunction.  No leukocytosis.  Evidence of anemia hemoglobin 11.3.  Platelets within range.  UA with 21-50 WBCs, large leukocytes with no bacteria seen but with WBC clumps present.   Imaging Studies ordered:  I ordered imaging studies including CT head/cervical spine/maxillofacial, CT chest abdomen pelvis, left elbow x-ray, I independently visualized and interpreted imaging which showed  CT head/cervical spine/maxillofacial: Subdural hematoma 2.2 mm.  Small vessel ischemic change.  Thalamic neurostimulator.  No acute traumatic fracture or subluxation of cervical spine.  Large heterogeneous thyroid gland.  No acute facial bony fracture. CT chest abdomen pelvis: No acute abnormality Left elbow x-ray: No acute abnormality Right tibia/fibula x-ray: No  acute I agree with the radiologist interpretation  Cardiac Monitoring: / EKG:  The patient was maintained on a cardiac monitor.  I personally viewed and interpreted the cardiac monitored which showed an underlying rhythm of: Sinus rhythm   Consultations Obtained:  See ED course  Problem List / ED Course / Critical interventions / Medication management  Fall, subdural hematoma I ordered medication including Tylenol  Reevaluation of the patient after these medicines showed that the patient improved I have reviewed the patients home medicines and have made adjustments as needed   Social Determinants of Health:  Denies tobacco, illicit drug use   Test / Admission - Considered:  Fall, subdural hematoma Vitals signs within  normal range and stable throughout visit. Laboratory/imaging studies significant for: See above 82 year old female presents emergency department for mechanical fall.  Patient has had 3 falls in the past 3 days.  On exam, very stages of ecchymotic healing on face/head.  Nonfocal neuroexam.  Patient's workup today overall concerning for subdural hematoma.  Otherwise, imaging studies negative for any acute traumatic injury.  Labs reassuring.  Consulted neurosurgery regarding subdural hematoma who recommended admission for observation and repeat CT 8 hours after initial CT.  Recommendation was also made of Keppra 500 twice daily for 7 days and holding patient's Plavix for the next 7 to 10 days.  Will admit to hospitalist.        Final Clinical Impression(s) / ED Diagnoses Final diagnoses:  None    Rx / DC Orders ED Discharge Orders     None         Peter Garter, [place] 07/21/23 1717    Loetta Rough, MD 07/22/23 [phone]

## 2023-07-21 NOTE — Assessment & Plan Note (Addendum)
Multiple falls.  Likely related to Parkinson's, and increasing frailty.  She is supposed to use a walker/cane, she did not use when she fell.   -PT eval -UA obtained in the ED-with large leukocytes, and subsequent urine cultures, continue ceftriaxone for now.

## 2023-07-21 NOTE — ED Triage Notes (Signed)
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 She denies tripping and states she does not know if she is dizzy

## 2023-07-21 NOTE — ED Notes (Signed)
Nurse attempted to clean previous head injury per daughter request.

## 2023-07-22 DIAGNOSIS — S065XAA Traumatic subdural hemorrhage with loss of consciousness status unknown, initial encounter: Principal | ICD-10-CM | POA: Diagnosis present

## 2023-07-22 DIAGNOSIS — R296 Repeated falls: Secondary | ICD-10-CM | POA: Diagnosis not present

## 2023-07-22 MED ORDER — ROSUVASTATIN CALCIUM 10 MG PO TABS
10.0000 mg | ORAL_TABLET | Freq: Every day | ORAL | Status: DC
  Administered 2023-07-22: 10 mg via ORAL
  Filled 2023-07-22: qty 1

## 2023-07-22 MED ORDER — FAMOTIDINE 20 MG PO TABS
10.0000 mg | ORAL_TABLET | Freq: Every day | ORAL | Status: DC
  Administered 2023-07-22: 10 mg via ORAL
  Filled 2023-07-22: qty 1

## 2023-07-22 MED ORDER — LEVETIRACETAM 500 MG PO TABS
500.0000 mg | ORAL_TABLET | Freq: Two times a day (BID) | ORAL | Status: DC
Start: 2023-07-22 — End: 2023-07-22
  Administered 2023-07-22: 500 mg via ORAL
  Filled 2023-07-22: qty 1

## 2023-07-22 MED ORDER — CEFDINIR 300 MG PO CAPS: 300.0000 mg | ORAL_CAPSULE | Freq: Two times a day (BID) | ORAL | 0 refills | Status: DC

## 2023-07-22 MED ORDER — CARBIDOPA-LEVODOPA ER 25-100 MG PO TBCR
1.0000 | EXTENDED_RELEASE_TABLET | Freq: Three times a day (TID) | ORAL | Status: DC
  Administered 2023-07-22: 1 via ORAL
  Filled 2023-07-22: qty 1

## 2023-07-22 MED ORDER — CLOPIDOGREL BISULFATE 75 MG PO TABS: 75.0000 mg | ORAL_TABLET | Freq: Every day | ORAL | Status: DC

## 2023-07-22 MED ORDER — LEVETIRACETAM 500 MG PO TABS: 500.0000 mg | ORAL_TABLET | Freq: Two times a day (BID) | ORAL | 0 refills | Status: AC

## 2023-07-22 MED ORDER — CEFDINIR 300 MG PO CAPS
300.0000 mg | ORAL_CAPSULE | Freq: Two times a day (BID) | ORAL | Status: DC
  Administered 2023-07-22: 300 mg via ORAL
  Filled 2023-07-22: qty 1

## 2023-07-22 MED ORDER — OXYBUTYNIN CHLORIDE ER 5 MG PO TB24: 10.0000 mg | ORAL_TABLET | Freq: Every day | ORAL | Status: DC

## 2023-07-22 NOTE — Hospital Course (Addendum)
82 year old female with a history of hypertension, hyperlipidemia, Parkinson's disease, left breast cancer presenting with increasing frequency of falls..  The patient reports falls over the last 3 days prior to admission.  The patient reports that the last fall was around 5 PM on 07/20/2023.  She reports hitting her head but denies any loss of consciousness.  She states that she is supposed to use a cane and a walker, but does not frequently use them in the house.  Aside from a headache, the patient denies any fevers, chills, dizziness, visual disturbance, nausea, vomiting, diarrhea, chest pain, shortness of breath.  The patient lives at home with her spouse. In the ED, the patient was afebrile and hemodynamically stable with oxygen saturation 96% on room air.  WBC 4.9, hemoglobin 1.3, plate 29.  Sodium 140, potassium 3.7, bicarbonate 27, serum creatinine 0.63.  LFTs were unremarkable.  CT of the brain showed acute SDH left tentorium measuring 2.2 mm with no mass effect.  Neurosurgery was contacted and stated patient did not need any operative intervention.  They recommended Keppra twice daily for 7 days.  Plavix was held.  Repeat CT of the brain approximately 9 hours after the initial CT showed stable small acute SDH measuring 2 to 3 mm with no new or significant interval bleed.  PT was consulted. The patient remained clinically stable without any new neurologic deficits.

## 2023-07-22 NOTE — Care Management Obs Status (Signed)
MEDICARE OBSERVATION STATUS NOTIFICATION   Patient Details  Name: Tracey Juarez MRN: [id number] Date of Birth: 1940/10/23   Medicare Observation Status Notification Given:  Yes    Karn Cassis, LCSW 07/22/2023, 1:13 PM

## 2023-07-22 NOTE — Discharge Summary (Addendum)
Physician Discharge Summary   Patient: Tracey Juarez MRN: [id number] DOB: 1941/02/16  Admit date:     07/21/2023  Discharge date: 07/22/23  Discharge Physician: Onalee Hua Lunden Mcleish   PCP: Dettinger, Elige Radon, MD   Recommendations at discharge:   Please follow up with primary care provider within 1-2 weeks  Please repeat BMP and CBC in one week   Hospital Course: 82 year old female with a history of hypertension, hyperlipidemia, Parkinson's disease, left breast cancer presenting with increasing frequency of falls..  The patient reports falls over the last 3 days prior to admission.  The patient reports that the last fall was around 5 PM on 07/20/2023.  She reports hitting her head but denies any loss of consciousness.  She states that she is supposed to use a cane and a walker, but does not frequently use them in the house.  Aside from a headache, the patient denies any fevers, chills, dizziness, visual disturbance, nausea, vomiting, diarrhea, chest pain, shortness of breath.  The patient lives at home with her spouse. In the ED, the patient was afebrile and hemodynamically stable with oxygen saturation 96% on room air.  WBC 4.9, hemoglobin 1.3, plate 29.  Sodium 140, potassium 3.7, bicarbonate 27, serum creatinine 0.63.  LFTs were unremarkable.  CT of the brain showed acute SDH left tentorium measuring 2.2 mm with no mass effect.  Neurosurgery was contacted and stated patient did not need any operative intervention.  They recommended Keppra twice daily for 7 days.  Plavix was held.  Repeat CT of the brain approximately 9 hours after the initial CT showed stable small acute SDH measuring 2 to 3 mm with no new or significant interval bleed.  PT was consulted. The patient remained clinically stable without any new neurologic deficits.   Assessment and Plan: Acute subdural hematoma -Initial CT brain showed acute SDH left tentorium measuring 2.2 mm with no mass effect -Repeat CT brain--no new or  significant interval bleed, no calvarial fracture -Holding Plavix x 10 days--restart on 08/01/23 -Neurosurgery was consulted and recommended holding Plavix and starting Keppra  -no new neuro deficits at time of d/c  Frequent falls -PT evaluation>>HHPT -xrays of the left elbow and right tib/fib neg for fracture/dislocation -CT cervical spine neg for fracture, dislocations -patient did not want HHPT; she wanted to continue outpatient PT  Pyuria -UA 21-50 WBC -Initially started on ceftriaxone -DC home with cefdinir for 4 additional days  Parkinson's disease -The patient is status post DBS -Follow-up at [facility] with Dr. Rubin Payor  Essential hypertension -Resume lisinopril  Mixed hyperlipidemia -Continue statin  History of stroke -Holding Plavix for 10 days     Consultants: neurosurgery Procedures performed: none  Disposition: Home Diet recommendation:  Regular diet DISCHARGE MEDICATION: Allergies as of 07/22/2023       Reactions   Cat Hair Extract    Ondansetron Swelling, Palpitations        Medication List     STOP taking these medications    multivitamin capsule   Vitamin D3 1.25 MG (50000 UT) Tabs       TAKE these medications    acetaminophen 325 MG tablet Commonly known as: TYLENOL Take 650 mg by mouth every 6 (six) hours as needed.   Carbidopa-Levodopa ER 25-100 MG tablet controlled release Commonly known as: SINEMET CR Take 1 tablet by mouth 3 (three) times daily.   cefdinir 300 MG capsule Commonly known as: OMNICEF Take 1 capsule (300 mg total) by mouth every 12 (twelve) hours.  cetirizine 10 MG tablet Commonly known as: ZYRTEC Take 10 mg by mouth daily.   clopidogrel 75 MG tablet Commonly known as: PLAVIX Take 1 tablet (75 mg total) by mouth daily. Restart on 08/01/23 What changed: additional instructions   famotidine 10 MG tablet Commonly known as: PEPCID Take 10 mg by mouth daily.   feeding supplement Liqd Take 1 Container by  mouth 3 (three) times daily between meals.   fluticasone 50 MCG/ACT nasal spray Commonly known as: FLONASE USE 1 SPRAY(S) IN EACH NOSTRIL TWICE DAILY AS NEEDED FOR ALLERGIES OR RHINITIS   levETIRAcetam 500 MG tablet Commonly known as: KEPPRA Take 1 tablet (500 mg total) by mouth 2 (two) times daily.   lisinopril 5 MG tablet Commonly known as: ZESTRIL Take 1 tablet (5 mg total) by mouth daily. What changed:  how much to take additional instructions   meclizine 12.5 MG tablet Commonly known as: ANTIVERT TAKE 1 TO 2 TABLETS BY MOUTH THREE TIMES DAILY AS NEEDED FOR DIZZINESS   oxybutynin 10 MG 24 hr tablet Commonly known as: DITROPAN-XL Take 1 tablet (10 mg total) by mouth at bedtime.   rosuvastatin 10 MG tablet Commonly known as: CRESTOR Take 1 tablet (10 mg total) by mouth daily.         Discharge Exam: Filed Weights   07/21/23 0914 07/21/23 1751 07/22/23 0600  Weight: 52.2 kg 51.5 kg 54.4 kg   HEENT:  [place]/AT, No thrush, no icterus CV:  RRR, no rub, no S3, no S4 Lung:  CTA, no wheeze, no rhonchi Abd:  soft/+BS, NT Ext:  No edema, no lymphangitis, no synovitis, no rash Neuro:  CN II-XII intact, strength 4/5 in RUE, RLE, strength 4/5 LUE, LLE; sensation intact bilateral; no dysmetria; babinski equivocal   Condition at discharge: stable  The results of significant diagnostics from this hospitalization (including imaging, microbiology, ancillary and laboratory) are listed below for reference.   Imaging Studies: CT Head Wo Contrast  Result Date: 07/21/2023 CLINICAL DATA:  Follow-up examination for subdural hematoma. EXAM: CT HEAD WITHOUT CONTRAST TECHNIQUE: Contiguous axial images were obtained from the base of the skull through the vertex without intravenous contrast. RADIATION DOSE REDUCTION: This exam was performed according to the departmental dose-optimization program which includes automated exposure control, adjustment of the mA and/or kV according to patient  size and/or use of iterative reconstruction technique. COMPARISON:  Prior head CT from earlier the same day. FINDINGS: Brain: Atrophy with chronic small vessel ischemic disease again noted. DBS electrode terminating near the subthalamic nucleus on the left, stable. Previously identified small acute subdural hematoma along the left tentorium again seen. Collection is little interval changed measuring 2-3 mm in maximal diameter. Additional small component overlies the posterior left parietal convexity measures up to 3-4 mm without mass effect (series 4, image 60), stable. No evidence for new or significant interval bleeding. No acute large vessel territory infarct. No mass lesion or midline shift. Vascular: No abnormal hyperdense vessel. Skull: Evolving right parietal scalp contusion.  Calvarium intact. Sinuses/Orbits: Globes and orbital soft tissues within normal limits. Mild mucosal thickening present about the left ethmoidal air cells. Visualized paranasal sinuses are otherwise largely clear. Mastoid air cells and middle ear cavities are clear as well. Other: None. IMPRESSION: 1. Stable small acute subdural hematoma along the left tentorium measuring 2-3 mm in maximal diameter. Additional small component overlies the posterior left parietal convexity measures up to 3-4 mm without mass effect. No evidence for new or significant interval bleeding. 2. Evolving right  parietal scalp contusion. No calvarial fracture. 3. Atrophy with chronic small vessel ischemic disease. 4. DBS electrode terminating near the subthalamic nucleus on the left, stable. Electronically Signed   By: Rise Mu M.D.   On: 07/21/2023 21:55   CT CHEST ABDOMEN PELVIS W CONTRAST  Result Date: 07/21/2023 CLINICAL DATA:  Multiple falls EXAM: CT CHEST, ABDOMEN, AND PELVIS WITH CONTRAST TECHNIQUE: Multidetector CT imaging of the chest, abdomen and pelvis was performed following the standard protocol during bolus administration of  intravenous contrast. RADIATION DOSE REDUCTION: This exam was performed according to the departmental dose-optimization program which includes automated exposure control, adjustment of the mA and/or kV according to patient size and/or use of iterative reconstruction technique. CONTRAST:  OMNIPAQUE IOHEXOL 300 MG/ML  SOLN COMPARISON:  10/08/2020 FINDINGS: CT CHEST FINDINGS Cardiovascular: Heart size normal. No pericardial effusion. Scattered coronary calcifications. Scattered aortic calcifications without aneurysm. Mediastinum/Nodes: No mass, hematoma, or adenopathy. Lungs/Pleura: No pleural effusion. No pneumothorax. Mild dependent atelectasis in the lung bases. Lungs otherwise clear. Musculoskeletal: Implanted subcutaneous generator left anterior chest, cephalad extent of catheter not visualized. Mild thoracic kyphosis without acute finding. CT ABDOMEN PELVIS FINDINGS Hepatobiliary: No focal liver abnormality is seen. No gallstones, gallbladder wall thickening, or biliary dilatation. Pancreas: Chronic mild pancreatic ductal dilatation up to 3 mm, seen down to the ampulla. No mass, parenchymal atrophy, regional inflammatory change. Spleen: Normal in size without focal abnormality. Adrenals/Urinary Tract: No adrenal mass. No urolithiasis or hydronephrosis. Multiple cortical lesions in both kidneys, some of which can be characterized as cysts, largest 1.9 cm 9 HU mid right kidney; no followup recommended. Urinary bladder incompletely distended. Stomach/Bowel: Stomach is incompletely distended, without acute finding. Small bowel decompressed. Appendix not discretely identified. The colon is partially distended, without acute finding. Vascular/Lymphatic: Scattered aortoiliac calcified atheromatous plaque without AAA. Portal vein patent. No abdominal or pelvic adenopathy. Reproductive: Chronic partially calcified left uterine fibroid. No adnexal mass. Other: No ascites.  No free air. Musculoskeletal: Degenerative  disc disease L4-S1. Facet DJD L3-S1, allowing grade 1 anterolisthesis L4-5 as before. Interval fixation of proximal left femur, mid cervical fracture line still evident. No acute fracture. IMPRESSION: 1. No acute findings. 2. Interval fixation of proximal left femur fracture. 3.  Aortic Atherosclerosis (ICD10-I70.0). Electronically Signed   By: Corlis Leak M.D.   On: 07/21/2023 14:11   CT Head Wo Contrast  Result Date: 07/21/2023 CLINICAL DATA:  Multiple recent falls. EXAM: CT HEAD WITHOUT CONTRAST CT MAXILLOFACIAL WITHOUT CONTRAST CT CERVICAL SPINE WITHOUT CONTRAST TECHNIQUE: Multidetector CT imaging of the head, cervical spine, and maxillofacial structures were performed using the standard protocol without intravenous contrast. Multiplanar CT image reconstructions of the cervical spine and maxillofacial structures were also generated. RADIATION DOSE REDUCTION: This exam was performed according to the departmental dose-optimization program which includes automated exposure control, adjustment of the mA and/or kV according to patient size and/or use of iterative reconstruction technique. COMPARISON:  None Available. FINDINGS: CT HEAD FINDINGS Brain: Thalamic neurostimulator in place on the left without complicating feature. The brain shows generalized atrophy with chronic small-vessel ischemic change of the white matter. There is an acute subdural hematoma along the tentorium on the left, maximal thickness 2.2 mm. I think this is probably along the superior surface of the tentorium. No mass-effect upon the brain. Vascular: No abnormal vascular finding. Skull: No skull fracture. Other: None CT MAXILLOFACIAL FINDINGS Osseous: No facial fracture. Orbits: No soft tissue orbital injury. Sinuses: Mild seasonal mucosal inflammatory changes. No advanced sinusitis. No post traumatic  fluid. Soft tissues: Negative CT CERVICAL SPINE FINDINGS Alignment: No traumatic malalignment. Skull base and vertebrae: No regional  fracture or focal bone lesion. Soft tissues and spinal canal: No traumatic soft tissue finding. Enlarged heterogeneous thyroid gland. Largest visible nodule 2 cm in diameter. Thyroid ultrasound recommended if not previously performed. Disc levels: Ordinary mild cervical spondylosis. Small endplate osteophytes at C5-6. Mild midcervical facet osteoarthritis. No likely compressive canal or foraminal stenosis. Upper chest: Negative other than simple appearing pleural and parenchymal scarring. Other: None IMPRESSION: HEAD CT: 1. Acute subdural hematoma along the tentorium on the left, maximal thickness 2.2 mm. No mass-effect upon the brain. 2. Atrophy and chronic small-vessel ischemic changes of the white matter. 3. Thalamic neurostimulator in place on the left without complicating feature. MAXILLOFACIAL CT: No facial fracture. CERVICAL SPINE CT: 1. No acute or traumatic finding. 2. Enlarged heterogeneous thyroid gland. Largest visible nodule 2 cm in diameter. Thyroid ultrasound recommended if not previously performed. Critical Value/emergent results were called by telephone at the time of interpretation on 07/21/2023 at 12:52 pm to provider EDP, who verbally acknowledged these results. Electronically Signed   By: Paulina Fusi M.D.   On: 07/21/2023 12:52   CT Cervical Spine Wo Contrast  Result Date: 07/21/2023 CLINICAL DATA:  Multiple recent falls. EXAM: CT HEAD WITHOUT CONTRAST CT MAXILLOFACIAL WITHOUT CONTRAST CT CERVICAL SPINE WITHOUT CONTRAST TECHNIQUE: Multidetector CT imaging of the head, cervical spine, and maxillofacial structures were performed using the standard protocol without intravenous contrast. Multiplanar CT image reconstructions of the cervical spine and maxillofacial structures were also generated. RADIATION DOSE REDUCTION: This exam was performed according to the departmental dose-optimization program which includes automated exposure control, adjustment of the mA and/or kV according to patient  size and/or use of iterative reconstruction technique. COMPARISON:  None Available. FINDINGS: CT HEAD FINDINGS Brain: Thalamic neurostimulator in place on the left without complicating feature. The brain shows generalized atrophy with chronic small-vessel ischemic change of the white matter. There is an acute subdural hematoma along the tentorium on the left, maximal thickness 2.2 mm. I think this is probably along the superior surface of the tentorium. No mass-effect upon the brain. Vascular: No abnormal vascular finding. Skull: No skull fracture. Other: None CT MAXILLOFACIAL FINDINGS Osseous: No facial fracture. Orbits: No soft tissue orbital injury. Sinuses: Mild seasonal mucosal inflammatory changes. No advanced sinusitis. No post traumatic fluid. Soft tissues: Negative CT CERVICAL SPINE FINDINGS Alignment: No traumatic malalignment. Skull base and vertebrae: No regional fracture or focal bone lesion. Soft tissues and spinal canal: No traumatic soft tissue finding. Enlarged heterogeneous thyroid gland. Largest visible nodule 2 cm in diameter. Thyroid ultrasound recommended if not previously performed. Disc levels: Ordinary mild cervical spondylosis. Small endplate osteophytes at C5-6. Mild midcervical facet osteoarthritis. No likely compressive canal or foraminal stenosis. Upper chest: Negative other than simple appearing pleural and parenchymal scarring. Other: None IMPRESSION: HEAD CT: 1. Acute subdural hematoma along the tentorium on the left, maximal thickness 2.2 mm. No mass-effect upon the brain. 2. Atrophy and chronic small-vessel ischemic changes of the white matter. 3. Thalamic neurostimulator in place on the left without complicating feature. MAXILLOFACIAL CT: No facial fracture. CERVICAL SPINE CT: 1. No acute or traumatic finding. 2. Enlarged heterogeneous thyroid gland. Largest visible nodule 2 cm in diameter. Thyroid ultrasound recommended if not previously performed. Critical Value/emergent results  were called by telephone at the time of interpretation on 07/21/2023 at 12:52 pm to provider EDP, who verbally acknowledged these results. Electronically Signed   By:  Paulina Fusi M.D.   On: 07/21/2023 12:52   CT Maxillofacial WO CM  Result Date: 07/21/2023 CLINICAL DATA:  Multiple recent falls. EXAM: CT HEAD WITHOUT CONTRAST CT MAXILLOFACIAL WITHOUT CONTRAST CT CERVICAL SPINE WITHOUT CONTRAST TECHNIQUE: Multidetector CT imaging of the head, cervical spine, and maxillofacial structures were performed using the standard protocol without intravenous contrast. Multiplanar CT image reconstructions of the cervical spine and maxillofacial structures were also generated. RADIATION DOSE REDUCTION: This exam was performed according to the departmental dose-optimization program which includes automated exposure control, adjustment of the mA and/or kV according to patient size and/or use of iterative reconstruction technique. COMPARISON:  None Available. FINDINGS: CT HEAD FINDINGS Brain: Thalamic neurostimulator in place on the left without complicating feature. The brain shows generalized atrophy with chronic small-vessel ischemic change of the white matter. There is an acute subdural hematoma along the tentorium on the left, maximal thickness 2.2 mm. I think this is probably along the superior surface of the tentorium. No mass-effect upon the brain. Vascular: No abnormal vascular finding. Skull: No skull fracture. Other: None CT MAXILLOFACIAL FINDINGS Osseous: No facial fracture. Orbits: No soft tissue orbital injury. Sinuses: Mild seasonal mucosal inflammatory changes. No advanced sinusitis. No post traumatic fluid. Soft tissues: Negative CT CERVICAL SPINE FINDINGS Alignment: No traumatic malalignment. Skull base and vertebrae: No regional fracture or focal bone lesion. Soft tissues and spinal canal: No traumatic soft tissue finding. Enlarged heterogeneous thyroid gland. Largest visible nodule 2 cm in diameter. Thyroid  ultrasound recommended if not previously performed. Disc levels: Ordinary mild cervical spondylosis. Small endplate osteophytes at C5-6. Mild midcervical facet osteoarthritis. No likely compressive canal or foraminal stenosis. Upper chest: Negative other than simple appearing pleural and parenchymal scarring. Other: None IMPRESSION: HEAD CT: 1. Acute subdural hematoma along the tentorium on the left, maximal thickness 2.2 mm. No mass-effect upon the brain. 2. Atrophy and chronic small-vessel ischemic changes of the white matter. 3. Thalamic neurostimulator in place on the left without complicating feature. MAXILLOFACIAL CT: No facial fracture. CERVICAL SPINE CT: 1. No acute or traumatic finding. 2. Enlarged heterogeneous thyroid gland. Largest visible nodule 2 cm in diameter. Thyroid ultrasound recommended if not previously performed. Critical Value/emergent results were called by telephone at the time of interpretation on 07/21/2023 at 12:52 pm to provider EDP, who verbally acknowledged these results. Electronically Signed   By: Paulina Fusi M.D.   On: 07/21/2023 12:52   DG Tibia/Fibula Right  Result Date: 07/21/2023 CLINICAL DATA:  Fall.  Right leg pain. EXAM: RIGHT TIBIA AND FIBULA - 2 VIEW COMPARISON:  None Available. FINDINGS: No acute fracture or dislocation. No aggressive osseous lesion. Mild to moderate degenerative changes of the imaged joints. Ankle mortise appears intact. Calcaneal spur noted along the Achilles tendon and Plantar aponeurosis attachment sites. No focal soft tissue swelling. No radiopaque foreign bodies. IMPRESSION: *No acute osseous abnormality of the right leg. Electronically Signed   By: Jules Schick M.D.   On: 07/21/2023 11:06   DG ELBOW COMPLETE LEFT (3+VIEW)  Result Date: 07/21/2023 CLINICAL DATA:  Fall.  Elbow pain. EXAM: LEFT ELBOW - COMPLETE 3+ VIEW COMPARISON:  None Available. FINDINGS: No acute fracture or dislocation. No aggressive osseous lesion. No significant  elbow joint arthritis. No radiopaque foreign bodies. Soft tissues are within normal limits. IMPRESSION: *No acute osseous abnormality of the left elbow joint. Electronically Signed   By: Jules Schick M.D.   On: 07/21/2023 11:04    Microbiology: Results for orders placed or performed during the  hospital encounter of 07/21/23  MRSA Next Gen by PCR, Nasal     Status: None   Collection Time: 07/21/23  5:34 PM   Specimen: Nasal Mucosa; Nasal Swab  Result Value Ref Range Status   MRSA by PCR Next Gen NOT DETECTED NOT DETECTED Final    Comment: (NOTE) The GeneXpert MRSA Assay (FDA approved for NASAL specimens only), is one component of a comprehensive MRSA colonization surveillance program. It is not intended to diagnose MRSA infection nor to guide or monitor treatment for MRSA infections. Test performance is not FDA approved in patients less than 1 years old. Performed at [facility] [address], [place], [place] [address]     Labs: CBC: Recent Labs  Lab 07/21/23 1006  WBC 4.9  HGB 11.3*  HCT 35.4*  MCV 97.0  PLT 189   Basic Metabolic Panel: Recent Labs  Lab 07/21/23 1137  NA 140  K 3.7  CL 103  CO2 27  GLUCOSE 110*  BUN 14  CREATININE 0.63  CALCIUM 9.6   Liver Function Tests: Recent Labs  Lab 07/21/23 1137  AST 19  ALT 6  ALKPHOS 65  BILITOT 0.5  PROT 7.0  ALBUMIN 4.0   CBG: No results for input(s): "GLUCAP" in the last 168 hours.  Discharge time spent: greater than 30 minutes.  Signed: Catarina Hartshorn, MD [facility] 07/22/2023

## 2023-07-22 NOTE — Evaluation (Signed)
Physical Therapy Evaluation Patient Details Name: Tracey Juarez MRN: [id number] DOB: 04-Nov-1940 Today's Date: 07/22/2023  History of Present Illness  Tracey Juarez is a 82 y.o. female with medical history significant for breast cancer, hypertension, CVA, Parkinson's disease.  Patient presented to the ED with complaints of falls, she reports 3 falls over the past 3 days.  She reports her last fall was about 5 PM yesterday when she tripped over an object and fell.  She reports she is unsteady on her feet.  Is supposed to use a walker or a cane, but she was not using either of them when she fell.  She reports a headache.  She denies dizziness, no chest pain., no difficulty breathing, no vomiting no diarrhea she has had good oral intake.  She lives with her husband.    Clinical Impression  On therapist arrival; patient is lying in bed but is awake.  She is pleasant and agreeable to therapist assessment.  Patient is quite soft spoken but understands commands and answers questions appropriately.  Noted bruising right side of her face and reports left hip soreness/ general soreness.  Patient needs moderate assist for supine to sit to bring trunk fully upright and and to come fully to the edge of the bed in sitting.  She sits in a slightly flexed position.  She needs moderate assist for initial boost up to standing to RW due to general weakness and needs cues to improve her posturing.  Patient needs less assist as she continues to stand with the RW and is then able to take a few steps over to the chair with noted shuffling gait pattern; slow gait speed.  She sits down with assistance to slow her descent.  patient left in chair with call button in reach, chair alarm set and nursing notified of mobility status.  Patient will benefit from continued skilled therapy services during the remainder of her hospital stay and at the next recommended venue of care to address deficits and promote return to  optimal function.             If plan is discharge home, recommend the following: A little help with walking and/or transfers;A little help with bathing/dressing/bathroom;Assistance with cooking/housework;Help with stairs or ramp for entrance;Assist for transportation   Can travel by private vehicle        Equipment Recommendations None recommended by PT  Recommendations for Other Services       Functional Status Assessment Patient has had a recent decline in their functional status and demonstrates the ability to make significant improvements in function in a reasonable and predictable amount of time.     Precautions / Restrictions Precautions Precautions: Fall Precaution Comments: has had multiple falls in the past few weeks Restrictions Weight Bearing Restrictions: No      Mobility  Bed Mobility Overal bed mobility: Needs Assistance Bed Mobility: Supine to Sit     Supine to sit: HOB elevated, Mod assist     General bed mobility comments: mod assist to bring trunk fully upright and to come fully to the edge of the bed Patient Response: Cooperative  Transfers Overall transfer level: Needs assistance Equipment used: Rolling walker (2 wheels) Transfers: Sit to/from Stand, Bed to chair/wheelchair/BSC Sit to Stand: Min assist, Mod assist   Step pivot transfers: Min assist       General transfer comment: needs mod assist for initial boost up to standing to RW however as she stands she requires less assist  Ambulation/Gait Ambulation/Gait assistance: Min Chemical engineer (Feet): 3 Feet Assistive device: Rolling walker (2 wheels) Gait Pattern/deviations: Shuffle       General Gait Details: shuffling gait; slow gait speed  Stairs            Wheelchair Mobility     Tilt Bed Tilt Bed Patient Response: Cooperative  Modified Rankin (Stroke Patients Only)       Balance Overall balance assessment: Needs assistance Sitting-balance support:  Bilateral upper extremity supported, Feet supported Sitting balance-Leahy Scale: Fair Sitting balance - Comments: fair sitting balance; tends to sit with trunk in a flexed posture   Standing balance support: Bilateral upper extremity supported, During functional activity, Reliant on assistive device for balance Standing balance-Leahy Scale: Fair Standing balance comment: fair standing balance with RW and min A from therapist                             Pertinent Vitals/Pain Pain Assessment Pain Assessment: 0-10 Pain Score: 3  Pain Location: left hip Pain Descriptors / Indicators: Sore Pain Intervention(s): Limited activity within patient's tolerance, Monitored during session, Repositioned    Home Living Family/patient expects to be discharged to:: Private residence Living Arrangements: Spouse/significant other Available Help at Discharge: Friend(s);Family;Neighbor;Available PRN/intermittently Type of Home: House Home Access: Stairs to enter Entrance Stairs-Rails: None Entrance Stairs-Number of Steps: 1   Home Layout: Laundry or work area in basement;Able to live on main level with bedroom/bathroom (laundry in basement; cats in basement) Home Equipment: Agricultural consultant (2 wheels);Cane - single point;Toilet riser;BSC/3in1;Shower seat - built in;Hand held shower head;Grab bars - tub/shower Additional Comments: states husband has CHF and is limited with how much he can assist    Prior Function Prior Level of Function : Independent/Modified Independent;History of Falls (last six months)             Mobility Comments: needs to use AD for safety       Extremity/Trunk Assessment   Upper Extremity Assessment Upper Extremity Assessment: Generalized weakness    Lower Extremity Assessment Lower Extremity Assessment: Generalized weakness    Cervical / Trunk Assessment Cervical / Trunk Assessment: Kyphotic  Communication   Communication Communication: Other  (comment);No apparent difficulties (soft spoken)  Cognition Arousal: Alert Behavior During Therapy: WFL for tasks assessed/performed Overall Cognitive Status: Within Functional Limits for tasks assessed                                 General Comments: soft spoken        General Comments      Exercises     Assessment/Plan    PT Assessment Patient needs continued PT services  PT Problem List Decreased strength;Decreased mobility;Decreased safety awareness;Decreased activity tolerance;Decreased balance;Pain       PT Treatment Interventions DME instruction;Gait training;Functional mobility training;Therapeutic activities;Patient/family education;Balance training;Therapeutic exercise    PT Goals (Current goals can be found in the Care Plan section)  Acute Rehab PT Goals Patient Stated Goal: return home PT Goal Formulation: With patient Time For Goal Achievement: 08/05/23 Potential to Achieve Goals: Good    Frequency Min 2X/week     Co-evaluation               AM-PAC PT "6 Clicks" Mobility  Outcome Measure Help needed turning from your back to your side while in a flat bed without using bedrails?: A Little Help needed moving  from lying on your back to sitting on the side of a flat bed without using bedrails?: A Lot Help needed moving to and from a bed to a chair (including a wheelchair)?: A Little Help needed standing up from a chair using your arms (e.g., wheelchair or bedside chair)?: A Little Help needed to walk in hospital room?: A Little Help needed climbing 3-5 steps with a railing? : A Lot 6 Click Score: 16    End of Session Equipment Utilized During Treatment: Gait belt Activity Tolerance: Patient tolerated treatment well Patient left: in chair;with chair alarm set;with call bell/phone within reach Nurse Communication: Mobility status PT Visit Diagnosis: Unsteadiness on feet (R26.81);Other abnormalities of gait and mobility (R26.89);Repeated  falls (R29.6);Muscle weakness (generalized) (M62.81);History of falling (Z91.81)    Time: 1610-9604 PT Time Calculation (min) (ACUTE ONLY): 30 min   Charges:   PT Evaluation $PT Eval Low Complexity: 1 Low   PT General Charges $$ ACUTE PT VISIT: 1 Visit         9:28 AM, 07/22/23 Catharina Pica Small Rushie Brazel MPT [facility] [place] [phone] Ph:[phone]

## 2023-07-22 NOTE — Care Management CC44 (Signed)
Condition Code 44 Documentation Completed  Patient Details  Name: Tracey Juarez MRN: [id number] Date of Birth: 07/17/1941   Condition Code 44 given:  Yes Patient signature on Condition Code 44 notice:  Yes Documentation of 2 MD's agreement:  Yes Code 44 added to claim:  Yes    Karn Cassis, LCSW 07/22/2023, 1:13 PM

## 2023-07-22 NOTE — TOC Initial Note (Signed)
Transition of Care [facility]) - Initial/Assessment Note    Patient Details  Name: Tracey Juarez MRN: [id number] Date of Birth: [birth year]  Transition of Care [facility]) CM/SW Contact:    Karn Cassis, LCSW Phone Number: 07/22/2023, 10:23 AM  Clinical Narrative: Pt admitted due to acute subdural hematoma. She reports she lives with her husband. Pt ambulates with cane or walker at baseline. PT evaluated pt and recommend home health. LCSW discussed with pt who states she is already going to [facility] and wants to continue going there instead of home health. No other needs reported.                  Expected Discharge Plan: OP Rehab Barriers to Discharge: Continued Medical Work up   Patient Goals and CMS Choice Patient states their goals for this hospitalization and ongoing recovery are:: return home   Choice offered to / list presented to : Patient [place] ownership interest in [facility].provided to::  (n/a)    Expected Discharge Plan and Services In-house Referral: Clinical Social Work   Post Acute Care Choice: Resumption of Svcs/PTA Provider Living arrangements for the past 2 months: Single Family Home                           HH Arranged: Refused HH          Prior Living Arrangements/Services Living arrangements for the past 2 months: Single Family Home Lives with:: Spouse Patient language and need for interpreter reviewed:: Yes Do you feel safe going back to the place where you live?: Yes          Current home services: DME (cane, walker) Criminal Activity/Legal Involvement Pertinent to Current Situation/Hospitalization: No - Comment as needed  Activities of Daily Living   ADL Screening (condition at time of admission) Independently performs ADLs?: Yes (appropriate for developmental age) Is the patient deaf or have difficulty hearing?: No Does the patient have difficulty seeing, even when wearing glasses/contacts?: No Does  the patient have difficulty concentrating, remembering, or making decisions?: No  Permission Sought/Granted                  Emotional Assessment     Affect (typically observed): Appropriate Orientation: : Oriented to Self, Oriented to Place, Oriented to  Time, Oriented to Situation Alcohol / Substance Use: Not Applicable Psych Involvement: No (comment)  Admission diagnosis:  Acute subdural hematoma (HCC) [S06.5XAA] Patient Active Problem List   Diagnosis Date Noted   Acute subdural hematoma (HCC) 07/21/2023   Falls 07/21/2023   Disequilibrium 12/06/2021   Tinnitus of left ear 12/06/2021   Vertigo 12/06/2021   Osteoarthritis, multiple sites 02/03/2019   History of CVA (cerebrovascular accident) 02/03/2019   Chronic cerebral ischemia 12/29/2018   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, [place] - [address] [place] [address] Phone: [phone] Fax: [phone]  [facility] [phone] - [place], [place] - [address] [place] [address] Phone: [phone] Fax: [phone]     Social Determinants of Health (SDOH) Social History: SDOH Screenings   Food Insecurity: No Food Insecurity (07/21/2023)  Housing: Low Risk  (07/21/2023)  Transportation Needs: No Transportation Needs (07/21/2023)  Utilities: Not At Risk (07/21/2023)  Alcohol Screen: Low Risk  (12/10/2022)  Depression (PHQ2-9): Low Risk  (06/19/2023)  Financial Resource Strain: Low Risk  (12/10/2022)  Physical  Activity: Sufficiently Active (12/10/2022)  Social Connections: Moderately Integrated (12/10/2022)  Stress: No Stress Concern Present (12/10/2022)  Tobacco Use: Low Risk  (07/21/2023)   SDOH Interventions:     Readmission Risk Interventions     No data to display

## 2023-07-22 NOTE — Plan of Care (Signed)
  Problem: Acute Rehab PT Goals(only PT should resolve) Goal: Pt Will Go Supine/Side To Sit Outcome: Progressing Flowsheets (Taken 07/22/2023 0929) Pt will go Supine/Side to Sit: with minimal assist Goal: Patient Will Transfer Sit To/From Stand Outcome: Progressing Flowsheets (Taken 07/22/2023 0929) Patient will transfer sit to/from stand: with minimal assist Goal: Pt Will Transfer Bed To Chair/Chair To Bed Outcome: Progressing Flowsheets (Taken 07/22/2023 0929) Pt will Transfer Bed to Chair/Chair to Bed: with contact guard assist Goal: Pt Will Ambulate Outcome: Progressing Flowsheets (Taken 07/22/2023 0929) Pt will Ambulate:  50 feet  with rolling walker  with minimal assist

## 2023-07-24 ENCOUNTER — Other Ambulatory Visit: Payer: Medicare PPO | Admitting: Pharmacist

## 2023-07-24 LAB — URINE CULTURE: Culture: 10000 — AB

## 2023-08-08 ENCOUNTER — Other Ambulatory Visit: Payer: Self-pay | Admitting: Family Medicine

## 2023-08-08 DIAGNOSIS — R42 Dizziness and giddiness: Secondary | ICD-10-CM

## 2023-08-08 DIAGNOSIS — H9312 Tinnitus, left ear: Secondary | ICD-10-CM

## 2023-08-13 NOTE — Progress Notes (Signed)
 Established Patient Office Visit  Subjective  Patient ID: Tracey Juarez, female    DOB: [birth year]  Age: 83 y.o. MRN: [id number]  Chief Complaint  Patient presents with   Hospitalization Follow-up    Went to hospital 4 weeks ago for falls. Has cut on head from fall hospital said she did not need stitches but stil looks bad   Dysuria    Symptoms for 3 days   Urinary Frequency    HPI Tracey Juarez is a 83 year old female present today August 20, 2023 for posthospital discharge follow-up, and complaining for dysuria and increased frequency.  Past medical history of hypertension, hyperlipidemia, Parkinson's disease, left breast cancer  Acute subdural hematoma  Lives at home home with her husband when she reports has issues with heart failure.  She has history of frequent falls. she  was  admitted 07/21/2023 and discharged 07/22/23 for fall hit head without LOC. She is supposed to use assisted device for ambulation walker or cane but have not been using them around the house She had 2 cyst CT scan done during her hospital stay.  Her CT of the brain show acute SDH left tentorium measuring 2.2 mm with no mass effect. Neurology consult was placed did not deem that client needed surgery.  She was started on 7 days of Keppra  twice daily with client reported she is done with the course. The second CT done was done 9 hours later showed stable small small acute SDH measuring 2 to 3 mm with no significant interval bleed.  Plavix  was held for few days client only restarted.  She denies headache, dizziness, syncope, chest pain.  Pyuria due to urinary Tract Infection: Patient complains of dysuria and frequency She has had symptoms for 3 days. Patient also complains of  vaginal itchiness . Patient denies congestion, headache, sorethroat, and vaginal discharge. Patient does have a history of recurrent UTI.  Patient does not have a history of pyelonephritis.  She was treated for pyuria during her hospital with iv  ceftriaxone  and d/c home with cefdinir  300 mg BID for 4 days. Which she reports that completed.  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)    Breast cancer of upper-outer quadrant of left female breast (HCC) 03/20/2015   Detrusor instability    Elevated cholesterol    GERD (gastroesophageal reflux disease)    Hot flashes    Humerus head fracture, left, with routine healing, subsequent encounter 2000   Hypertension    Melanoma (HCC)    Mixed basal-squamous cell carcinoma    skin   Parkinson disease (HCC)    Personal  history of radiation therapy    Stroke (HCC)    on Plavix , no deficits   Past Surgical History:  Procedure Laterality Date   BRAIN SURGERY     BREAST LUMPECTOMY Left 04/2015   radiation   BREAST LUMPECTOMY WITH RADIOACTIVE SEED AND SENTINEL LYMPH NODE BIOPSY Left 04/25/2015   Procedure: LEFT BREAST PARTIAL MASTECTOMY WITH RADIOACTIVE SEED AND SENTINEL LYMPH NODE MAPPING;  Surgeon: Debby Shipper, MD;  Location: [facility];  Service: General;  Laterality: Left;   FOOT SURGERY  2004    KNEE SURGERY  2007   skin cancer excised     TONSILLECTOMY     Social History   Tobacco Use   Smoking status: Never   Smokeless tobacco: Never  Vaping Use   Vaping status: Never Used  Substance Use Topics   Alcohol use: No    Alcohol/week: 0.0 standard drinks of alcohol   Drug use: No   Social History   Socioeconomic History   Marital status: Married    Spouse name: Helayne    Number of children: 0   Years of education: ged   Highest education level: GED or equivalent  Occupational History   Occupation: Retired  Tobacco Use   Smoking status: Never   Smokeless tobacco: Never  Vaping Use   Vaping status: Never Used  Substance and Sexual Activity   Alcohol use: No    Alcohol/week: 0.0 standard drinks of alcohol   Drug use: No   Sexual activity: Never    Birth control/protection: Post-menopausal    Comment: 1st intercourse 16 yo-2 partners  Other Topics Concern   Not on file  Social History Narrative   Not on file   Social Drivers of Health   Financial Resource Strain: Low Risk  (12/10/2022)   Overall Financial Resource Strain (CARDIA)    Difficulty of Paying Living Expenses: Not hard at all  Food Insecurity: No Food Insecurity (07/21/2023)   Hunger Vital Sign    Worried About Running Out of Food in the Last Year: Never true    Ran Out of Food in the Last Year: Never true  Transportation Needs: No Transportation Needs (07/21/2023)   PRAPARE - Administrator, Civil Service (Medical): No    Lack of Transportation (Non-Medical): No  Physical Activity: Sufficiently Active (12/10/2022)   Exercise Vital Sign    Days of Exercise per Week: 5 days    Minutes of Exercise per Session: 30 min  Stress: No Stress Concern Present (12/10/2022)   [employer] of Occupational Health - Occupational Stress Questionnaire    Feeling of Stress : Not at all  Social Connections: Moderately Integrated (12/10/2022)   Social Connection and Isolation Panel [NHANES]     Frequency of Communication with Friends and Family: More than three times a week    Frequency of Social Gatherings with Friends and Family: More than three times a week    Attends Religious Services: More than 4 times per year    Active Member of [employer] or Organizations: No    Attends Banker Meetings: Never    Marital Status: Married  Catering Manager Violence: Not At Risk (07/21/2023)   Humiliation, Afraid, Rape, and Kick questionnaire    Fear of Current or Ex-Partner: No    Emotionally Abused: No    Physically Abused: No    Sexually Abused: No   Family Status  Relation Name Status   Mother  Deceased   Father  Deceased  Mat Aunt  (Not Specified)   Mat Uncle  (Not Specified)   Neg Hx  (Not Specified)  No partnership data on file   Family History  Problem Relation Age of Onset   Hypertension Mother    Heart attack Mother    Heart attack Maternal Aunt    Heart attack Maternal Uncle    Breast cancer Neg Hx    Allergies  Allergen Reactions   Cat Hair Extract    Ondansetron  Swelling and Palpitations      Review of Systems  Constitutional:  Negative for chills and fever.  HENT:  Negative for congestion and sore throat.   Cardiovascular:  Negative for chest pain and leg swelling.  Gastrointestinal:  Negative for constipation, diarrhea, nausea and vomiting.  Genitourinary:  Positive for dysuria and frequency. Negative for flank pain.  Musculoskeletal:  Positive for falls.  Neurological:  Negative for dizziness, seizures, loss of consciousness and headaches.  Psychiatric/Behavioral:  Negative for suicidal ideas. The patient does not have insomnia.    Negative unless indicated in HPI   Objective:     BP (!) 148/72   Pulse 90   Temp 98.2 F (36.8 C) (Temporal)   Ht 5' 2 (1.575 m)   Wt 115 lb 12.8 oz (52.5 kg)   SpO2 99%   BMI 21.18 kg/m  BP Readings from Last 3 Encounters:  08/20/23 (!) 148/72  07/22/23 (!) 134/51  06/19/23 (!) 155/71   Wt  Readings from Last 3 Encounters:  08/20/23 115 lb 12.8 oz (52.5 kg)  07/22/23 119 lb 14.9 oz (54.4 kg)  06/19/23 115 lb (52.2 kg)      Physical Exam Vitals and nursing note reviewed.  Constitutional:      Appearance: Normal appearance.  HENT:     Head: Normocephalic. Laceration present.      Comments: Well approximated    Nose: Nose normal.  Eyes:     General: No scleral icterus.    Extraocular Movements: Extraocular movements intact.     Conjunctiva/sclera: Conjunctivae normal.     Pupils: Pupils are equal, round, and reactive to light.  Cardiovascular:     Rate and Rhythm: Normal rate and regular rhythm.  Pulmonary:     Effort: Pulmonary effort is normal.     Breath sounds: Normal breath sounds.  Abdominal:     General: Bowel sounds are normal.     Palpations: Abdomen is soft.  Musculoskeletal:        General: Normal range of motion.     Right lower leg: No edema.     Left lower leg: No edema.  Skin:    General: Skin is warm and dry.  Neurological:     Mental Status: She is alert and oriented to person, place, and time.     Gait: Gait abnormal.     Comments: Use a walker for ambulation without slowing unsteady gait  Psychiatric:        Mood and Affect: Mood normal.        Behavior: Behavior normal.        Thought Content: Thought content normal.        Judgment: Judgment normal.    Urine dipstick shows positive for +1 RBC's, positive trace for protein, positive for +1 leukocytes, and positive for trace ketones.  Normal lesions 1: He is not getting good health micro exam: 0-5 WBC's per HPF, 6-2 RBC's per HPF, few + bacteria, close on oxalate crystals seen, epithelial cells 0-10, and positive  yeast  No results found for any visits on 08/20/23.  Last CBC Lab Results  Component Value Date   WBC 4.9 07/21/2023   HGB 11.3 (L) 07/21/2023   HCT 35.4 (L) 07/21/2023   MCV 97.0 07/21/2023   MCH 31.0 07/21/2023   RDW 13.8 07/21/2023   PLT 189 07/21/2023   Last  metabolic panel Lab Results  Component Value Date   GLUCOSE 110 (H) 07/21/2023   NA 140 07/21/2023   K 3.7 07/21/2023   CL 103 07/21/2023   CO2 27 07/21/2023   BUN 14 07/21/2023   CREATININE 0.63 07/21/2023   GFRNONAA >60 07/21/2023   CALCIUM  9.6 07/21/2023   PROT 7.0 07/21/2023   ALBUMIN 4.0 07/21/2023   LABGLOB 2.5 06/19/2023   AGRATIO 2.1 09/22/2022   BILITOT 0.5 07/21/2023   ALKPHOS 65 07/21/2023   AST 19 07/21/2023   ALT 6 07/21/2023   ANIONGAP 10 07/21/2023   Last lipids Lab Results  Component Value Date   CHOL 145 06/19/2023   HDL 68 06/19/2023   LDLCALC 62 06/19/2023   TRIG 77 06/19/2023   CHOLHDL 2.1 06/19/2023   Last hemoglobin A1c No results found for: HGBA1C Last thyroid  functions Lab Results  Component Value Date   TSH 0.818 03/21/2021   T4TOTAL 8.2 03/21/2021        Assessment & Plan:  Acute subdural hematoma [facility])  Hospital discharge follow-up  Dysuria -     Urinalysis, Routine w reflex microscopic -     Urine Culture -     Doxycycline  Hyclate; Take 1 capsule (100 mg total) by mouth 2 (two) times daily.  Dispense: 14 capsule; Refill: 0  Urinary frequency -     Urinalysis, Routine w reflex microscopic -     Urine Culture -     Doxycycline  Hyclate; Take 1 capsule (100 mg total) by mouth 2 (two) times daily.  Dispense: 14 capsule; Refill: 0   Zion is a 83 year old Caucasian female seen today for hospital discharge follow-up and UTI, no acute distress -Pyuria due to urinary Tract Infection: Will treat empirically with doxycycline  while waiting for culture result.  Client made aware based on culture result no antibiotic may be needed -[employer] disability placard form completed -Acute subdural hematoma: Client is back to baseline and is resuming all her prescribed medication She is instructed to continue taking all prescribed medication Increase hydration, may use over-the-counter Pyridium Future: Client will discuss possible home health with  PCP at her next follow-up appointment  Encourage healthy lifestyle choices, including diet (rich in fruits, vegetables, and lean proteins, and low in salt and simple carbohydrates) and exercise (at least 30 minutes of moderate physical activity daily).     The above assessment and management plan was discussed with the patient. The patient verbalized understanding of and has agreed to the management plan. Patient is aware to call the clinic if they develop any new symptoms or if symptoms persist or worsen. Patient is aware when to return to the clinic for a follow-up visit. Patient educated on when it is appropriate to go to the emergency department.  Return for as already scheduled with PCP.    Zionah Criswell [place] Thompson, DNP [facility] [address] [place], [place] [address] [phone]    Note: This document was prepared by [employer] voice dictation technology and any errors that results from this process are unintentional.

## 2023-08-20 ENCOUNTER — Ambulatory Visit (INDEPENDENT_AMBULATORY_CARE_PROVIDER_SITE_OTHER): Payer: Medicare Other | Admitting: Nurse Practitioner

## 2023-08-20 ENCOUNTER — Encounter: Payer: Self-pay | Admitting: Nurse Practitioner

## 2023-08-20 VITALS — BP 148/72 | HR 90 | Temp 98.2°F | Ht 62.0 in | Wt 115.8 lb

## 2023-08-20 DIAGNOSIS — R300 Dysuria: Secondary | ICD-10-CM | POA: Diagnosis not present

## 2023-08-20 DIAGNOSIS — R350 Frequency of micturition: Secondary | ICD-10-CM

## 2023-08-20 DIAGNOSIS — Z09 Encounter for follow-up examination after completed treatment for conditions other than malignant neoplasm: Secondary | ICD-10-CM | POA: Diagnosis not present

## 2023-08-20 DIAGNOSIS — S065XAA Traumatic subdural hemorrhage with loss of consciousness status unknown, initial encounter: Secondary | ICD-10-CM | POA: Diagnosis not present

## 2023-08-20 DIAGNOSIS — N390 Urinary tract infection, site not specified: Secondary | ICD-10-CM | POA: Insufficient documentation

## 2023-08-20 LAB — MICROSCOPIC EXAMINATION: Renal Epithel, UA: NONE SEEN /[HPF]

## 2023-08-20 LAB — URINALYSIS, ROUTINE W REFLEX MICROSCOPIC
Bilirubin, UA: NEGATIVE
Glucose, UA: NEGATIVE
Nitrite, UA: NEGATIVE
Specific Gravity, UA: 1.025 (ref 1.005–1.030)
Urobilinogen, Ur: 0.2 mg/dL (ref 0.2–1.0)
pH, UA: 5.5 (ref 5.0–7.5)

## 2023-08-20 MED ORDER — DOXYCYCLINE HYCLATE 100 MG PO CAPS: 100.0000 mg | ORAL_CAPSULE | Freq: Two times a day (BID) | ORAL | 0 refills | Status: DC

## 2023-08-23 LAB — URINE CULTURE: Organism ID, Bacteria: NO GROWTH

## 2023-08-27 ENCOUNTER — Other Ambulatory Visit: Payer: Medicare Other

## 2023-09-05 ENCOUNTER — Other Ambulatory Visit: Payer: Self-pay | Admitting: Family Medicine

## 2023-09-05 DIAGNOSIS — N3281 Overactive bladder: Secondary | ICD-10-CM

## 2023-09-10 ENCOUNTER — Encounter: Payer: Self-pay | Admitting: Family Medicine

## 2023-09-10 ENCOUNTER — Ambulatory Visit: Payer: Medicare Other | Admitting: Family Medicine

## 2023-09-10 VITALS — BP 138/75 | HR 72 | Ht 62.0 in | Wt 113.0 lb

## 2023-09-10 DIAGNOSIS — R300 Dysuria: Secondary | ICD-10-CM | POA: Diagnosis not present

## 2023-09-10 DIAGNOSIS — R296 Repeated falls: Secondary | ICD-10-CM

## 2023-09-10 DIAGNOSIS — R350 Frequency of micturition: Secondary | ICD-10-CM | POA: Diagnosis not present

## 2023-09-10 LAB — MICROSCOPIC EXAMINATION: Renal Epithel, UA: NONE SEEN /[HPF]

## 2023-09-10 LAB — URINALYSIS, COMPLETE
Bilirubin, UA: NEGATIVE
Glucose, UA: NEGATIVE
Ketones, UA: NEGATIVE
Nitrite, UA: POSITIVE — AB
Specific Gravity, UA: 1.015 (ref 1.005–1.030)
Urobilinogen, Ur: 1 mg/dL (ref 0.2–1.0)
pH, UA: >9 — ABNORMAL HIGH (ref 5.0–7.5)

## 2023-09-10 MED ORDER — CEPHALEXIN 500 MG PO CAPS: 500.0000 mg | ORAL_CAPSULE | Freq: Four times a day (QID) | ORAL | 0 refills | Status: DC

## 2023-09-10 NOTE — Addendum Note (Signed)
Addended by: Arville Care on: 09/10/2023 03:42 PM   Modules accepted: Orders

## 2023-09-10 NOTE — Progress Notes (Addendum)
BP 138/75   Pulse 72   Ht 5\' 2"  (1.575 m)   Wt 113 lb (51.3 kg)   SpO2 96%   BMI 20.67 kg/m    Subjective:   Patient ID: Tracey Juarez, female    DOB: 1941-03-10, 83 y.o.   MRN: [id number]  HPI: Tracey Juarez is a 83 y.o. female presenting on 09/10/2023 for Fall (frequent), Urinary Tract Infection, and nodule (Left upper chest-MRI 4y ago. Was told to follow)   HPI Recurrent falls Patient is coming in today with recurrent falls over the past 2 months she has had multiple falls per one of the largest ones included pelvis and hip fracture for which she had surgery and has been doing physical therapy for until her husband's health started waiting that she stopped physical therapy and since she stopped that she is getting a little weak again and she has had a fall most recently where she fell and hit the back of her head in the front of her face and has bruising and scab on the top of her scalp.  She is using a walker and I do think a lot of her falls are related to some very slick slippers that she was wearing and they have since taking them away from her.  She does still feel weak though and feels like she needs a lot of support even with a walker and wants to do physical therapy to help improve so she does not fall again.  Dysuria and frequency Patient is having dysuria and frequency.  Patient has been having 3 days of dysuria and frequency and burning and feels like she has urinary tract infection.  She has had recurrent urinary tract infections and admits that she does not drink a lot of water because it makes her have to urinate more frequently so she is frequently slightly dehydrated and is likely why she keeps getting UTIs.  She denies any flank pain or fevers or chills  Relevant past medical, surgical, family and social history reviewed and updated as indicated. Interim medical history since our last visit reviewed. Allergies and medications reviewed and updated.  Review  of Systems  Constitutional:  Negative for chills and fever.  HENT:  Negative for congestion, ear discharge and ear pain.   Eyes:  Negative for redness and visual disturbance.  Respiratory:  Negative for chest tightness and shortness of breath.   Cardiovascular:  Negative for chest pain and leg swelling.  Gastrointestinal:  Negative for abdominal pain, constipation, diarrhea, nausea and vomiting.  Genitourinary:  Positive for dysuria, frequency and urgency. Negative for decreased urine volume, difficulty urinating, pelvic pain, vaginal bleeding, vaginal discharge and vaginal pain.  Musculoskeletal:  Negative for back pain and gait problem.  Skin:  Positive for color change (Bruising on the maxillary region of the face) and wound (Small healing laceration on the right parietal region of her scalp). Negative for rash.  Neurological:  Negative for light-headedness and headaches.  Psychiatric/Behavioral:  Negative for agitation and behavioral problems.   All other systems reviewed and are negative.   Per HPI unless specifically indicated above   Allergies as of 09/10/2023       Reactions   Cat Dander    Ondansetron Swelling, Palpitations        Medication List        Accurate as of September 10, 2023  3:42 PM. If you have any questions, ask your nurse or doctor.  STOP taking these medications    doxycycline 100 MG capsule Commonly known as: VIBRAMYCIN Stopped by: Elige Radon Geraldine Sandberg       TAKE these medications    acetaminophen 325 MG tablet Commonly known as: TYLENOL Take 650 mg by mouth every 6 (six) hours as needed.   Carbidopa-Levodopa ER 25-100 MG tablet controlled release Commonly known as: SINEMET CR Take 1 tablet by mouth 3 (three) times daily.   cephALEXin 500 MG capsule Commonly known as: KEFLEX Take 1 capsule (500 mg total) by mouth 4 (four) times daily. Started by: Elige Radon Dennisha Mouser   cetirizine 10 MG tablet Commonly known as: ZYRTEC Take 10  mg by mouth daily.   clopidogrel 75 MG tablet Commonly known as: PLAVIX Take 1 tablet (75 mg total) by mouth daily. Restart on 08/01/23   famotidine 10 MG tablet Commonly known as: PEPCID Take 10 mg by mouth daily.   feeding supplement Liqd Take 1 Container by mouth 3 (three) times daily between meals.   fluticasone 50 MCG/ACT nasal spray Commonly known as: FLONASE USE 1 SPRAY(S) IN EACH NOSTRIL TWICE DAILY AS NEEDED FOR ALLERGIES OR RHINITIS   levETIRAcetam 500 MG tablet Commonly known as: KEPPRA Take 1 tablet (500 mg total) by mouth 2 (two) times daily.   lisinopril 5 MG tablet Commonly known as: ZESTRIL Take 1 tablet (5 mg total) by mouth daily. What changed:  how much to take additional instructions   meclizine 12.5 MG tablet Commonly known as: ANTIVERT TAKE 1 TO 2 TABLETS BY MOUTH THREE TIMES DAILY AS NEEDED FOR DIZZINESS   oxybutynin 10 MG 24 hr tablet Commonly known as: DITROPAN-XL TAKE 1 TABLET BY MOUTH AT BEDTIME   rosuvastatin 10 MG tablet Commonly known as: CRESTOR Take 1 tablet (10 mg total) by mouth daily.         Objective:   BP 138/75   Pulse 72   Ht 5\' 2"  (1.575 m)   Wt 113 lb (51.3 kg)   SpO2 96%   BMI 20.67 kg/m   Wt Readings from Last 3 Encounters:  09/10/23 113 lb (51.3 kg)  08/20/23 115 lb 12.8 oz (52.5 kg)  07/22/23 119 lb 14.9 oz (54.4 kg)    Physical Exam Vitals and nursing note reviewed.  Constitutional:      General: She is not in acute distress.    Appearance: She is well-developed. She is not diaphoretic.  Eyes:     Conjunctiva/sclera: Conjunctivae normal.  Cardiovascular:     Rate and Rhythm: Normal rate and regular rhythm.     Heart sounds: Normal heart sounds. No murmur heard. Pulmonary:     Effort: Pulmonary effort is normal. No respiratory distress.     Breath sounds: Normal breath sounds. No wheezing.  Musculoskeletal:        General: No tenderness. Normal range of motion.  Skin:    General: Skin is warm and  dry.     Findings: Bruising (Bruising in the maxillary region of the face, looks to be healing) and laceration (Small healing laceration in the parietal region on the right side of her scalp) present. No rash.  Neurological:     Mental Status: She is alert and oriented to person, place, and time.     Coordination: Coordination normal.  Psychiatric:        Behavior: Behavior normal.     Urinalysis: 3+ blood and 3+ protein and nitrite positive and leukocyte 1+ and WBC 11-30 and many bacteria and yeast present  Assessment & Plan:   Problem List Items Addressed This Visit   None Visit Diagnoses       Burning with urination    -  Primary   Relevant Medications   cephALEXin (KEFLEX) 500 MG capsule   Other Relevant Orders   Urine Culture   Urinalysis, Complete (Completed)   Microscopic Examination (Completed)     Urinary frequency       Relevant Medications   cephALEXin (KEFLEX) 500 MG capsule   Other Relevant Orders   Urine Culture   Urinalysis, Complete (Completed)   Microscopic Examination (Completed)     Recurrent falls while walking       Relevant Orders   Ambulatory referral to Physical Therapy   Microscopic Examination (Completed)       Placed referral to physical therapy for the patient  Follow up plan: Return if symptoms worsen or fail to improve.  Counseling provided for all of the vaccine components Orders Placed This Encounter  Procedures   Urine Culture   Microscopic Examination   Urinalysis, Complete   Ambulatory referral to Physical Therapy    Arville Care, MD [place] [facility] 09/10/2023, 3:42 PM

## 2023-09-14 ENCOUNTER — Encounter: Payer: Self-pay | Admitting: Family Medicine

## 2023-09-14 LAB — URINE CULTURE

## 2023-09-15 ENCOUNTER — Other Ambulatory Visit: Payer: Self-pay

## 2023-09-15 DIAGNOSIS — R300 Dysuria: Secondary | ICD-10-CM

## 2023-09-15 DIAGNOSIS — R350 Frequency of micturition: Secondary | ICD-10-CM

## 2023-09-15 DIAGNOSIS — N390 Urinary tract infection, site not specified: Secondary | ICD-10-CM

## 2023-09-16 ENCOUNTER — Telehealth: Payer: Self-pay | Admitting: Family Medicine

## 2023-09-16 NOTE — Telephone Encounter (Signed)
 FYI regarding Referral.  Copied from CRM [id number]. Topic: Referral - Question >> Sep 16, 2023  8:37 AM Nestora J wrote: Reason for CRM: [facility] stated they received a referral for the pt but they are unable to get in touch with her to schedule her an appointment, she just wanted the referral specialist to be aware  You can follow up at [phone]

## 2023-10-09 DIAGNOSIS — R262 Difficulty in walking, not elsewhere classified: Secondary | ICD-10-CM | POA: Insufficient documentation

## 2023-10-09 NOTE — Progress Notes (Signed)
 Name: Tracey Juarez DOB: [birth year] MRN: [id number]  History of Present Illness: Tracey Juarez is a 83 y.o. female who presents today as a new patient at [facility] [place]. All available relevant medical records have been reviewed. She is accompanied by her neighbor Toniann Fail. - GU History: 1. OAB with urge incontinence. - Neurogenic risks: Parkinson disease, prior stroke, age. - Previously failed Myrbetriq. - Currently taking Ditropan (Oxybutynin) XL 10 mg daily. 2. Vaginal atrophy. 3. Bilateral renal cysts. Per CT on 07/21/2023.  She reports chief complaint of recurrent UTls.  Urine culture results in past 12 months: - 09/15/2022: Negative - 07/21/2023: Positive for E. coli - 08/20/2023: Negative - 09/10/2023: Positive for Proteus mirabilis. Treated with Keflex.  Per PCP note on 09/10/2023 patient "admits that she does not drink a lot of water because it makes her have to urinate more frequently so she is frequently slightly dehydrated and is likely why she keeps getting UTIs."  Urinary Symptoms: She reports 3+ UTl's in the last year. When present, UTI symptoms include dysuria, increased urinary urgency, frequency, mild confusion. She reports acute UTI symptoms today.  At baseline: She reports urinary frequency, nocturia x1-2, urgency, and urge incontinence. Leaking several times per day on average; using 3-4 diapers per day on average - worries that the dampness is contributing to her UTI risk. She essentially tries to do timed voiding ("I try to get ahead of the urge"). Denies significant caffeine intake.  Denies dysuria, gross hematuria, hesitancy, straining to void, or sensations of incomplete emptying.  She reports distant history of pyelonephritis.  She denies history of kidney stones.  Vaginal / prolapse Symptoms: She denies vaginal pain, bleeding, or discharge.  She denies use of topical vaginal estrogen cream.  Bowel Symptoms: Reports some  constipation; has a continent bowel movement 1 time(s) per 2-3 days.   She denies diarrhea or fecal incontinence.  Past OB/GYN History: OB History     Gravida  0   Para  0   Term  0   Preterm  0   AB  0   Living  0      SAB  0   IAB  0   Ectopic  0   Multiple  0   Live Births  0          She denies being sexually active.  She is post menopausal.  She denies history of hysterectomy.  Fall Screening: Do you usually have a device to assist in your mobility? Yes - walker   Medications: Current Outpatient Medications  Medication Sig Dispense Refill   acetaminophen (TYLENOL) 325 MG tablet Take 650 mg by mouth every 6 (six) hours as needed.     Carbidopa-Levodopa ER (SINEMET CR) 25-100 MG tablet controlled release Take 1 tablet by mouth 3 (three) times daily.     cetirizine (ZYRTEC) 10 MG tablet Take 10 mg by mouth daily.     clopidogrel (PLAVIX) 75 MG tablet Take 1 tablet (75 mg total) by mouth daily. Restart on 08/01/23     estradiol (ESTRACE) 0.1 MG/GM vaginal cream Discard plastic applicator. Insert a blueberry size amount (approximately 1 gram) of cream on fingertip inside vagina at bedtime every night for 1 week then every other night. For long term use. 30 g 3   famotidine (PEPCID) 10 MG tablet Take 10 mg by mouth daily.     feeding supplement (BOOST HIGH PROTEIN) LIQD Take 1 Container by mouth 3 (three) times daily between meals.  fluticasone (FLONASE) 50 MCG/ACT nasal spray USE 1 SPRAY(S) IN EACH NOSTRIL TWICE DAILY AS NEEDED FOR ALLERGIES OR RHINITIS 48 g 0   lisinopril (ZESTRIL) 5 MG tablet Take 1 tablet (5 mg total) by mouth daily. (Patient taking differently: Take 2.5 mg by mouth daily. Takes few times per week) 90 tablet 3   meclizine (ANTIVERT) 12.5 MG tablet TAKE 1 TO 2 TABLETS BY MOUTH THREE TIMES DAILY AS NEEDED FOR DIZZINESS 60 tablet 5   rosuvastatin (CRESTOR) 10 MG tablet Take 1 tablet (10 mg total) by mouth daily. 90 tablet 3   Vibegron  (GEMTESA) 75 MG TABS Take 1 tablet (75 mg total) by mouth daily.     levETIRAcetam (KEPPRA) 500 MG tablet Take 1 tablet (500 mg total) by mouth 2 (two) times daily. (Patient not taking: Reported on 10/12/2023) 14 tablet 0   No current facility-administered medications for this visit.    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   Detrusor instability    Elevated cholesterol    GERD (gastroesophageal reflux disease)    Hot flashes    Humerus head fracture, left, with routine healing, subsequent encounter 2000   Hypertension    Melanoma (HCC)    Mixed basal-squamous cell carcinoma    skin   Parkinson disease (HCC)    Personal history of radiation therapy    Stroke (HCC)    on Plavix, no deficits   Past Surgical History:  Procedure Laterality Date   BRAIN SURGERY     BREAST LUMPECTOMY Left 04/2015   radiation   BREAST LUMPECTOMY WITH RADIOACTIVE SEED AND SENTINEL LYMPH NODE BIOPSY Left 04/25/2015   Procedure: LEFT BREAST PARTIAL MASTECTOMY WITH RADIOACTIVE SEED AND SENTINEL LYMPH NODE MAPPING;  Surgeon: Harriette Bouillon, MD;  Location: [facility];  Service: General;  Laterality: Left;   FOOT SURGERY  2004   KNEE SURGERY  2007   skin cancer excised     TONSILLECTOMY     Family History  Problem Relation Age of Onset   Hypertension Mother    Heart attack Mother    Heart attack Maternal Aunt    Heart attack Maternal Uncle    Breast cancer Neg Hx    Social History   Socioeconomic History   Marital status: Married    Spouse name: Jake Shark    Number of children: 0   Years of education: ged   Highest education level: GED or equivalent  Occupational History   Occupation: Retired  Tobacco Use   Smoking status: Never   Smokeless tobacco:  Never  Vaping Use   Vaping status: Never Used  Substance and Sexual Activity   Alcohol use: No    Alcohol/week: 0.0 standard drinks of alcohol   Drug use: No   Sexual activity: Never    Birth control/protection: Post-menopausal    Comment: 1st intercourse 16 yo-2 partners  Other Topics Concern   Not on file  Social History Narrative   Not on file   Social Drivers of Health   Financial Resource Strain: Low Risk  (12/10/2022)   Overall Financial Resource Strain (CARDIA)    Difficulty of Paying Living Expenses: Not hard at all  Food Insecurity: No Food Insecurity (07/21/2023)   Hunger Vital  Sign    Worried About Programme researcher, broadcasting/film/video in the Last Year: Never true    Ran Out of Food in the Last Year: Never true  Transportation Needs: No Transportation Needs (07/21/2023)   PRAPARE - Administrator, Civil Service (Medical): No    Lack of Transportation (Non-Medical): No  Physical Activity: Sufficiently Active (12/10/2022)   Exercise Vital Sign    Days of Exercise per Week: 5 days    Minutes of Exercise per Session: 30 min  Stress: No Stress Concern Present (12/10/2022)   [employer] of Occupational Health - Occupational Stress Questionnaire    Feeling of Stress : Not at all  Social Connections: Moderately Integrated (12/10/2022)   Social Connection and Isolation Panel [NHANES]    Frequency of Communication with Friends and Family: More than three times a week    Frequency of Social Gatherings with Friends and Family: More than three times a week    Attends Religious Services: More than 4 times per year    Active Member of [employer] or Organizations: No    Attends Banker Meetings: Never    Marital Status: Married  Catering manager Violence: Not At Risk (07/21/2023)   Humiliation, Afraid, Rape, and Kick questionnaire    Fear of Current or Ex-Partner: No    Emotionally Abused: No    Physically Abused: No    Sexually Abused: No    SUBJECTIVE  Review of  Systems Constitutional: Patient denies any unintentional weight loss or change in strength lntegumentary: Patient denies any rashes or pruritus Cardiovascular: Patient denies chest pain or syncope Respiratory: Patient denies shortness of breath Gastrointestinal: As per HPI  Musculoskeletal: Patient denies muscle cramps or weakness Neurologic: Patient denies convulsions or seizures Allergic/Immunologic: Patient denies recent allergic reaction(s) Hematologic/Lymphatic: Patient denies bleeding tendencies Endocrine: Patient denies heat/cold intolerance  GU: As per HPI.  OBJECTIVE Vitals:   10/12/23 1030  BP: (!) 161/72  Pulse: 60  Temp: 97.9 F (36.6 C)   There is no height or weight on file to calculate BMI.  Physical Examination Constitutional: No obvious distress; patient is non-toxic appearing  Cardiovascular: No visible lower extremity edema.  Respiratory: The patient does not have audible wheezing/stridor; respirations do not appear labored  Gastrointestinal: Abdomen non-distended Musculoskeletal: Normal ROM of UEs  Skin: No obvious rashes/open sores  Neurologic: CN 2-12 grossly intact Psychiatric: Answered questions appropriately with normal affect  Hematologic/Lymphatic/Immunologic: No obvious bruises or sites of spontaneous bleeding   PVR: 29 ml  Patient missed the hat for voided urine; agreed to I&O cath for urine specimen collection.  UA (I&O cath): trace blood; otherwise unremarkable Urine microscopy (I&O cath): negative   ASSESSMENT OAB (overactive bladder) - Plan: Urinalysis, Routine w reflex microscopic, BLADDER SCAN AMB NON-IMAGING, In and Out Cath, Vibegron (GEMTESA) 75 MG TABS, estradiol (ESTRACE) 0.1 MG/GM vaginal cream, CANCELED: Urinalysis, Routine w reflex microscopic  Urge incontinence - Plan: Urinalysis, Routine w reflex microscopic, BLADDER SCAN AMB NON-IMAGING, In and Out Cath, Vibegron (GEMTESA) 75 MG TABS, estradiol (ESTRACE) 0.1 MG/GM vaginal  cream, CANCELED: Urinalysis, Routine w reflex microscopic  Urinary urgency - Plan: Urinalysis, Routine w reflex microscopic, BLADDER SCAN AMB NON-IMAGING, In and Out Cath, Vibegron (GEMTESA) 75 MG TABS, estradiol (ESTRACE) 0.1 MG/GM vaginal cream, CANCELED: Urinalysis, Routine w reflex microscopic  Recurrent UTI - Plan: Urinalysis, Routine w reflex microscopic, BLADDER SCAN AMB NON-IMAGING, In and Out Cath, Vibegron (GEMTESA) 75 MG TABS, estradiol (ESTRACE) 0.1 MG/GM vaginal cream, DISCONTINUED: sulfamethoxazole-trimethoprim (BACTRIM  DS) 800-160 MG tablet, CANCELED: Urinalysis, Routine w reflex microscopic, CANCELED: Urine Culture  Atrophic vaginitis - Plan: Urinalysis, Routine w reflex microscopic, BLADDER SCAN AMB NON-IMAGING, In and Out Cath, estradiol (ESTRACE) 0.1 MG/GM vaginal cream, CANCELED: Urinalysis, Routine w reflex microscopic, CANCELED: Urine Culture  Ambulatory dysfunction - Plan: Urinalysis, Routine w reflex microscopic, BLADDER SCAN AMB NON-IMAGING, In and Out Cath, Vibegron (GEMTESA) 75 MG TABS, CANCELED: Urinalysis, Routine w reflex microscopic  Parkinson's disease without dyskinesia, with fluctuating manifestations (HCC) - Plan: Urinalysis, Routine w reflex microscopic, BLADDER SCAN AMB NON-IMAGING, In and Out Cath, Vibegron (GEMTESA) 75 MG TABS, CANCELED: Urinalysis, Routine w reflex microscopic  UTI symptoms - Plan: In and Out Cath, DISCONTINUED: sulfamethoxazole-trimethoprim (BACTRIM DS) 800-160 MG tablet, CANCELED: Urinalysis, Routine w reflex microscopic, CANCELED: Urine Culture  She reports concern for possible UTI today, however urine specimen is unremarkable. Suspect that her dysuria is secondary to her untreated vaginal atrophy. No urine culture or antibiotic treatment indicated at this time.  1. Recurrent UTls.  We discussed the possible etiologies of recurrent UTls including ascending infection related to intercourse; vaginal atrophy; transmural infection that has been  treated incompletely; urinary tract stones; incomplete bladder emptying with urinary stasis; kidney or bladder tumor; urethral diverticulum; and colonization of  vagina and urinary tract with pathologic, adherent organisms.   For UTI prevention we discussed the following options, for which detailed information has been included in Patient Information section of today's After Visit Summary. > Maintain adequate fluid intake daily to flush out the urinary tract. > Go to the bathroom to urinate every 4-6 hours while awake to minimize urinary stasis / bacterial overgrowth in the bladder. > Proanthocyanidin (PAC) supplement 36 mg daily; must be soluble (insoluble form of PAC will be ineffective). Recommend Ellura.  > D-mannose 2 g daily to minimize bacterial adherence in urinary tract > Vitamin C supplement to acidify urine to suppress bacterial growth > Probiotic to maintain healthy vaginal microbiome > Topical vaginal estrogen for vaginal atrophy.  We discussed the following: - The etiology and consequences of urogenital epithelial atrophy was explained to patient. The thinning of the epithelium of the urethra can contribute to urinary urgency and frequency syndromes. In addition, the normal bacterial flora that colonizes the perineum may contribute to UTI risk because the thin urethral epithelium allows the bacteria to become adherent and the change in vaginal pH can disrupt the vaginal / urethral microbiome and allow for bacterial overgrowth. - The normal bacterial flora that colonizes the perineum may contribute to UTI risk because the thin urethral epithelium allows the bacteria to become adherent and the change in vaginal pH can disrupt the vaginal / urethral microbiome and allow for bacterial overgrowth.  - Topical vaginal estrogen replacement will take about 3 months to restore the vaginal pH. - Topical vaginal replacement may sting/burn initially due to severe dryness, which will improve with  ongoing treatment as the tissue gets healthier.  - OK to have sex with any of the topical vaginal estrogen replacement options.  - There have been studies that evaluate use of low-dose intravaginal estrogen cream that shows minimal systemic absorption that is negligible after 3 weeks. There have been no studies indicating that use of topical vaginal estrogen increases a patient's risk of contributing to breast cancer development or recurrence.  If recurrent UTls persist, may consider UTI prophylaxis with a daily low dose antibiotic in the future.  Patient ultimately decided to start with topical vaginal estrogen cream and will consider OTC supplements  for UTI prevention. Handout provided.  2. OAB with urinary frequency, nocturia, urgency, and urge incontinence. We discussed the symptoms of overactive bladder (OAB), which include urinary urgency, frequency, nocturia, with or without urge incontinence. While we may not know the exact etiology of OAB, several risk factors can be identified.  - We discussed this patient's neurogenic risk factors for OAB-type symptoms including Parkinson disease, prior stroke.  - Likely exacerbated by functional incontinence secondary to her ambulatory dysfunction and caffeine intake.   We discussed the following management options in detail including potential benefits, risks, and side effects: Behavioral therapy: Modify fluid intake Decreasing bladder irritants (such as caffeine) Urge suppression strategies Bladder retraining / timed voiding Double voiding Medication(s): - Currently taking Ditropan (Oxybutynin) XL 10 mg daily. We discussed the potential side effects of anticholinergics including dry eyes, dry mouth, constipation, cognitive impairment and urinary retention. She is not considered to be a safe candidate for anticholinergic medications at this point due to risk for side effects; this drug class is likely to increase her disequilibrium and fall risk. We  therefore discussed recommendation to discontinue Ditropan (Oxybutynin) and switch to a beta-3 agonist medication as a safer alternative (Myrbetriq or Gemtesa). We discussed the potential side effects / risks / benefits.  She decided to proceed with trial of Gemtesa 75 mg daily for 6 weeks. Advised to continue timed voiding and to consider further reduction of caffeine intake.   We agreed to plan for follow up in 6 weeks or sooner if needed. Patient verbalized understanding of and agreement with current plan. All questions were answered.   PLAN Advised the following: 1. Discontinue Ditropan (Oxybutynin) XL.  2. Start Gemtesa 75 mg daily.  3. Start topical vaginal estrogen cream as prescribed. 4. Maintain adequate fluid intake daily to flush out the urinary tract. 5. Urinate every 4-6 hours while awake to minimize urinary stasis / bacterial overgrowth in the bladder. 6. Consider OTC supplements for UTI prevention. 7. Return in about 6 weeks (around 11/23/2023) for UA, PVR, & f/u with Evette Georges NP.  Orders Placed This Encounter  Procedures   Urinalysis, Routine w reflex microscopic   In and Out Cath   BLADDER SCAN AMB NON-IMAGING   Total time spent caring for the patient today was over 45 minutes. This includes time spent on the date of the visit reviewing the patient's chart before the visit, time spent during the visit, and time spent after the visit on documentation. Over 50% of that time was spent in face-to-face time with this patient for direct counseling. E&M based on time and complexity of medical decision making.  It has been explained that the patient is to follow regularly with their PCP in addition to all other providers involved in their care and to follow instructions provided by these respective offices. Patient advised to contact urology clinic if any urologic-pertaining questions, concerns, new symptoms or problems arise in the interim period.  Patient Instructions  UTI  prevention / management:  Difference between Urinalysis (urine dipstick test) and Urine culture:  Urinalysis (urine dipstick test): A quick office test used as an indicator to determine whether or not further testing is necessary (such as a urine culture, urine microscopy, etc.) The urinalysis cannot differentiate a true bacterial UTI or give a definitive diagnosis for the findings.  Urine culture: May be performed based on the findings of a urinalysis to evaluate for UTI. Grows out on a petri dish for 48-72 hours. Provides important information about: whether or  not bacterial growth is present and if so: what the predominant bacteria is which antibiotics will work best against that bacteria That information is important so that we can diagnose and treat patients appropriately as there are other conditions which may mimic UTls which must not be missed (such as cancer, interstitial cystitis, stones, etc.). Assists [place] with antibiotic stewardship to minimize patient's risk for developing antibiotic resistance (getting to a point where no antibiotics work anymore).  Options when UTI symptoms occur: 1. Call [facility] [place] and request to speak with triage nurse (phone # [phone], select option 3). In accordance with clinic guidelines the nurse will determine next steps based on patient-reported symptoms, which may include: same-day lab visit to provide urine specimen, recommendation to schedule Urology office visit appointment for further evaluation, recommendation to proceed to ER, etc. 2. Call your Primary Care Provider (PCP) office to request urgent / same-day visit. Be sure to request for urine culture to be ordered and have results faxed to Urology (fax # [phone]).  3. Go to urgent care. Be sure to request for urine culture to be ordered and have results faxed to Urology (fax # [phone]).   For bladder pain/ burning with urination: - Can take over-the-counter  Pyridium (phenazopyridine; commonly known under the "AZO" brand) for a few days as needed. Limit use to no more than 3 days consecutively due to risk for methemoglobinemia, liver function issues, and bone health damage with long term use of Pyridium. - Alternative: Prescription urinary analgesics (such as Uribel, Urogesic blue, Urelle, Uro-MP). Often expensive / poorly covered by insurance unfortunately.  Options / recommendations for UTI prevention: - Topical vaginal estrogen for vaginal atrophy (aka Genitourinary Syndrome of Menopause (GSM)). - Adequate daily fluid intake to flush out the urinary tract. - Go to the bathroom to urinate every 4-6 hours while awake to minimize urinary stasis / bacterial overgrowth in the bladder. - Proanthocyanidin (PAC) supplement 36 mg daily; must be soluble (insoluble form of PAC will be ineffective). Recommended brand: Ellura. This is an over-the-counter supplement (often must be found/ purchased online) supplement derived from cranberries with concentrated active component: Proanthocyanidin (PAC) 36 mg daily. Decreases bacterial adherence to bladder lining.  - D-mannose powder (2 grams daily). This is an over-the-counter supplement which decreases bacterial adherence to bladder lining (it is a sugar that inhibits bacterial adherence to urothelial cells by binding to the pili of enteric bacteria). Take as per manufacturer recommendation. Can be used as an alternative or in addition to the concentrated cranberry supplement.  - Vitamin C supplement to acidify urine to minimize bacterial growth.  - Probiotic to maintain healthy vaginal microbiome to suppress bacteria at urethral opening. Brand recommendations: Darrold Junker (includes probiotic & D-mannose ), Feminine Balance (highest concentration of lactobacillus) or Hyperbiotic Pro 15.  Note for patients with diabetes:  - Be aware that D-mannose contains sugar.  Note for patients with interstitial cystitis (IC):  -  Patients with IC should typically avoid cranberry/ PAC supplements and Vitamin C supplements due to their acidity, which may exacerbate IC-related bladder pain. - Symptoms of true bacterial UTI can overlap / mimic symptoms of an IC flare up. Antibiotic use is NOT indicated for IC flare ups. Urine culture needed prior to antibiotic treatment for IC patients. The goal is to minimize your risk for developing antibiotic-resistant bacteria.    Vaginal atrophy I Genitourinary syndrome of menopause (GSM):  What it is: Changes in the vaginal environment (including the vulva and urethra) including: Thinning  of the epithelium (skin/ mucosa surface) Can contribute to urinary urgency and frequency Can contribute to dryness, itching, irritation of the vulvar and vaginal tissue Can contribute to pain with intercourse Can contribute to physical changes of the labia, vulva, and vagina such as: Narrowing of the vaginal opening Decreased vaginal length Loss of labial architecture Labial adhesions Pale color of vulvovaginal tissue  Loss of pubic hair Allows bacteria to become adherent  Results in increased risk for urinary tract infection (UTI) due to bacterial overgrowth and migration up the urethra into the bladder Change in vaginal pH (acid/ base balance) Allows for alteration / disruption of the normal bacterial flora / microbiome Results in increased risk for urinary tract infection (UTI) due to bacterial overgrowth  Treatment options: Over-the-counter lubricants (see list below). Prescription vaginal estrogen replacement. Options: Topical vaginal estrogen cream Estrace, Premarin, or compounded estradiol cream/ gel We advise: Discard plastic applicator as that tends to use more medication than you need, which is not harmful but wastes / uses up the medication. Also the plastic applicator may cause discomfort. Insert blueberry size amount of medication via the tip of your finger inside vagina  nightly for 1 week then 2-3 times per week (long term). Estring vaginal ring Exchanged every 3 months (either at home or in office by provider) Vagifem vaginal tablet Inserted nightly for 2 weeks then twice a week (long term) lntrarosa vaginal suppository Vaginal DHEA: converts to estrogen in vaginal tissue without systemic effect Inserted nightly (long term) Vaginal laser therapy (Mona Lisa touch) Performed in 3 treatments each 6 weeks apart (available in our [place] office). Can feel like a sunburn for 3-4 days after each treatment until new skin heals in. Usually not covered by insurance. Estimated cost is $1500 for all 3 sessions.  FYI regarding prescription vaginal estrogen treatment options: All topical vaginal estrogen replacement options are equivalent in terms of efficacy. Topical vaginal estrogen replacement will take about 3 months to be effective. OK to have sex with any of the topical vaginal estrogen replacement options. Topical vaginal estrogen replacement may sting/burn initially due to severe dryness, which will improve with ongoing treatment. There have been studies that evaluate use of low-dose intravaginal estrogen that show minimal systemic absorption which is negligible after 3 weeks. There have been no studies indicating increased risk of contributing to cancer development or recurrence.  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 Treatment Comments I Available Products   Lubricants    Water-based Ingredients: Deionized water, glycerin, propylene glycol; latex safe; rare irritation; dry out with extended sexual activity Astroglide, Good Clean Love, K-Y Jelly, Natural, Organic, Pink, Sliquid, Sylk, Yes    Oil Based Ingredients: avocado, olive, peanut, corn; latex safe; can be used with silicone products; staining; safe (unless peanut allergy); non-irritating Coconut oil, vegetable oil, vitamin E oil  Silicone-Based Ingredients: Silicone polymers; staining; typically nonirritating, long lasting; waterproof; should not be used with silicone dilators, sexual toys, or gynecologic products Astroglide X, Oceanus Ultra Pure, Pink Silicone, Pjur Eros, Replens Silky Smooth, Silicone Premium JO, SKYN, Uberlube, [employer] Based Minimize harm to sperm motility; designed Astroglide TTC, Conceive Plus, Pre for couples trying to conceive Seed, Yes Baby  Fertility Friendly Minimize harm to sperm motility; designed Astroglide, TTC, Conceive Plus, Pre for couples trying to conceive Seed, Yes  Baby  Vaginal Moisturizers   Vaginal Moisturizers For maintenance use 1 to 3 times weekly; can benefit women with dryness, chafing with AOL, and recurrent vaginal infections irrespective of sexual activity timing Balance Active Menopause Vaginal Moisturizing Lubricant, Canesintima Intimate Moisturizer, Replens, Rephresh, Sylk Natural Intimate Moisturizer, Yes Vaginal Moisturizer  Hybrids Properties of both water and silicone-based products (combination of a vaginal lubricant and moisturizer); Non-irritating; good option for women with allergies and sensitivities Lubrigyn, Luvena  Suppositories Hyaluronic acid to retain moisture Revaree  Vulvar Soothing Creams/Oils    Medicated CreamsP ain and burn relief; Ingredients: 4% Lidocaine, Aloe Vera gel Releveum ([place])  Non-Medicated Creams For anti-itch and moisture/maintenance; Ingredients: Coconut oil, Avocado oil, Shea Butter,  Olive oil, Vitamin E Vajuvenate, Vmagic  Oils !For moisture/maintenance !Coconut oil, Vitamin E oil, Emu oil              Overactive bladder (OAB) overview for patients:  Symptoms may include: urinary urgency ("gotta go" feeling) urinary frequency (voiding >8 times per day) night time urination (nocturia) urge incontinence of urine (UUI)  While we do not know the exact etiology of OAB, several treatment options exist including:  Behavioral therapy: Reducing fluid intake Decreasing bladder stimulants (such as caffeine) and irritants (such as acidic food, spicy foods, alcohol) Urge suppression strategies Bladder retraining via timed voiding  Pelvic floor physical therapy  Medication(s) - can use one or both of the drug classes below. Anticholinergic / antimuscarinic medications:  Mechanism of action: Activate M3 receptors to reduce detrusor stimulation and increase bladder capacity   (parasympathetic nervous system). Effect: Relaxes the bladder to decrease overactivity, increase bladder storage capacity, and increase time between voids. Onset: Slow acting (may take 8-12 weeks to determine efficacy). Medications include: Vesicare (Solifenacin), Ditropan (Oxybutynin), Detrol (Tolterodine), Toviaz (Fesoterodine), Sanctura (Trospium), Urispas (Flavoxate), Enablex (Darifenacin), Bentyl (Dicyclomine), Levsin (Hyoscyamine ). Potential side effects include but are not limited to: Dry eyes, dry mouth, constipation, cognitive impairment, dementia risk with long term use, and urinary retention/ incomplete bladder emptying. Insurance companies generally prefer for patients to try 1-2 anticholinergic / antimuscarinic medications first due to low cost. Some exceptions are made based on patient-specific comorbidities / risk factors. Beta-3 agonist medications: Mechanism of action: Stimulates selective B3 adrenergic receptors to cause smooth muscle bladder relaxation (sympathetic nervous  system). Effect: Relaxes the bladder to decrease overactivity, increase bladder storage capacity, and increase time between voids. Onset: Slow acting (may take 8-12 weeks to determine efficacy). Medications include: Myrbetriq (Mirabegron) and Vibegron Leslye Peer). Potential side effects include but are not limited to: urinary retention / incomplete bladder emptying and elevated blood pressure (more likely to occur in individuals with pre-existing uncontrolled hypertension). These medications tend to be more expensive than the anticholinergic / antimuscarinic medications.   For patients with refractory OAB (if the above treatment options have been unsuccessful): Posterior tibial nerve stimulation (PTNS). Small acupuncture-type needle inserted near ankle with electric current to stimulate bladder via posterior tibial nerve pathway. Initially requires 12 weekly in-office treatments lasting 30 minutes each; followed by monthly in-office treatments lasting 30 minutes each for 1 year.  Bladder Botox injections. How it is done: Typically done via in-office cystoscopy; sometimes done in the OR depending on the situation. The bladder is numbed with lidocaine instilled via a catheter. Then the urologist injects Botox into the bladder muscle wall in about 20 locations. Causes local paralysis of the bladder muscle at the injection sites to reduce bladder muscle overactivity / spasms. The effect lasts for approximately 6  months and cannot be reversed once performed. Risks may included but are not limited to: infection, incomplete bladder emptying/ urinary retention, short term need for self-catheterization or indwelling catheter, and need for repeat therapy. There is a 5-12% chance of needing to catheterize with Botox - that usually resolves in a few months as the Botox wears off. Typically Botox injections would need to be repeated every 3-12 months since this is not a permanent therapy.  Sacral neuromodulation  trial (Medtronic lnterStim or Axonics implant). Sacral neuromodulation is FDA-approved for uncontrolled urinary urgency, urinary frequency, urinary urge incontinence, non-obstructive urinary retention, or fecal incontinence. It is not FDA-approved as a treatment for pain. The goal of this therapy is at least a 50% improvement in symptoms. It is NOT realistic to expect a 100% cure. This is a a 2-step outpatient procedure. After a successful test period, a permanent wire and generator are placed in the OR. We discussed the risk of infection. We reviewed the fact that about 30% of patients fail the test phase and are not candidates for permanent generator placement. During the 1-2 week trial phase, symptoms are documented by the patient to determine response. If patient gets at least a 50% improvement in symptoms, they may then proceed with Step 2. Step 1: Trial lead placement. Per physician discretion, may done one of two ways: Percutaneous nerve evaluation (PNE) in the [facility] urology office. Performed by urologist under local anesthesia (numbing the area with lidocaine) using a spinal needle for placement of test wire, which usually stays in place for 5-7 days to determine therapy response. Test lead placement in OR under anesthesia. Usually stays in place 2 weeks to determine therapy response. > Step 2: Permanent implantation of sacral neuromodulation device, which is performed in the OR.  Sacral neuromodulation implants: All are conditionally MRI safe. Manufacturer: Medtronic Website: [employer].gl therapy/right-for-you.html Options: lnterStim X: Non-rechargeable. The battery lasts 10 years on average. lnterStim Micro: Rechargeable. The battery lasts 15 years on average and must be charged routinely. Approximately 50% smaller implant than lnterStim X implant.  Manufacturer: Axonics Website:  Findrealrelief.axonics.com Options: Non-rechargeable (Axonics F15): The battery lasts 15 years on average. Rechargeable (Axonics R20): The battery lasts 20 years on average and must be charged in office for about 1 hour every 6-10 months on average. Approximately 50% smaller implant than Axonics non-rechargeable implant.  Note: Generally the rechargeable devices are only advised for very small or thin patients who may not have sufficient adipose tissue to comfortably overlay the implanted device.  Suprapubic catheter (SP tube) placement. Only done in severely refractory OAB when all other options have failed or are not a viable treatment choice depending on patient factors. Involves placement of a catheter through the lower abdomen into the bladder to continuously drain the bladder into an external collection bag, which patient can then empty at their convenience every few hours. Done via an outpatient surgical procedure in the OR under anesthesia. Risks may included but are not limited to: surgical site pain, infections, skin irritation / breakdown, chronic bacteriuria, symptomatic UTls. The SP tube must stay in place continuously. This is a reversible procedure however - the insertion site will close if catheter is removed for more than a few hours. The SP tube must be exchanged routinely every 4 weeks to prevent the catheter from becoming clogged with sediment. SP tube exchanges are typically performed at a urology nurse visit or by a home health nurse.   Electronically signed by:  Donnita Falls, MSN, FNP-C,  [facility] 10/12/2023 12:28 PM

## 2023-10-12 ENCOUNTER — Telehealth: Payer: Self-pay

## 2023-10-12 ENCOUNTER — Encounter: Payer: Self-pay | Admitting: Urology

## 2023-10-12 ENCOUNTER — Ambulatory Visit (INDEPENDENT_AMBULATORY_CARE_PROVIDER_SITE_OTHER): Payer: Medicare Other | Admitting: Urology

## 2023-10-12 VITALS — BP 161/72 | HR 60 | Temp 97.9°F

## 2023-10-12 DIAGNOSIS — R399 Unspecified symptoms and signs involving the genitourinary system: Secondary | ICD-10-CM

## 2023-10-12 DIAGNOSIS — N3941 Urge incontinence: Secondary | ICD-10-CM

## 2023-10-12 DIAGNOSIS — N952 Postmenopausal atrophic vaginitis: Secondary | ICD-10-CM | POA: Diagnosis not present

## 2023-10-12 DIAGNOSIS — N390 Urinary tract infection, site not specified: Secondary | ICD-10-CM

## 2023-10-12 DIAGNOSIS — N3281 Overactive bladder: Secondary | ICD-10-CM

## 2023-10-12 DIAGNOSIS — G20A2 Parkinson's disease without dyskinesia, with fluctuations: Secondary | ICD-10-CM

## 2023-10-12 DIAGNOSIS — R262 Difficulty in walking, not elsewhere classified: Secondary | ICD-10-CM

## 2023-10-12 DIAGNOSIS — R3915 Urgency of urination: Secondary | ICD-10-CM

## 2023-10-12 DIAGNOSIS — Z87440 Personal history of urinary (tract) infections: Secondary | ICD-10-CM

## 2023-10-12 LAB — URINALYSIS, ROUTINE W REFLEX MICROSCOPIC
Bilirubin, UA: NEGATIVE
Glucose, UA: NEGATIVE
Ketones, UA: NEGATIVE
Leukocytes,UA: NEGATIVE
Nitrite, UA: NEGATIVE
Protein,UA: NEGATIVE
Specific Gravity, UA: 1.02 (ref 1.005–1.030)
Urobilinogen, Ur: 0.2 mg/dL (ref 0.2–1.0)
pH, UA: 7 (ref 5.0–7.5)

## 2023-10-12 LAB — MICROSCOPIC EXAMINATION
Bacteria, UA: NONE SEEN
Epithelial Cells (non renal): NONE SEEN /HPF (ref 0–10)
RBC, Urine: NONE SEEN /HPF (ref 0–2)
WBC, UA: NONE SEEN /HPF (ref 0–5)

## 2023-10-12 LAB — BLADDER SCAN AMB NON-IMAGING: Scan Result: 29

## 2023-10-12 MED ORDER — ESTRADIOL 0.1 MG/GM VA CREA: TOPICAL_CREAM | VAGINAL | 3 refills | Status: AC

## 2023-10-12 MED ORDER — GEMTESA 75 MG PO TABS: 1.0000 | ORAL_TABLET | Freq: Every day | ORAL | Status: DC

## 2023-10-12 MED ORDER — SULFAMETHOXAZOLE-TRIMETHOPRIM 800-160 MG PO TABS: 1.0000 | ORAL_TABLET | Freq: Two times a day (BID) | ORAL | 0 refills | Status: DC

## 2023-10-12 NOTE — Telephone Encounter (Signed)
 Tried  calling patient and was told she at the doctor and to call back. "Tracey Juarez - Please let patient know that her I&O urine specimen was unremarkable; no evidence of UTI. No urine culture or antibiotic treatment indicated at this time. 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

## 2023-10-12 NOTE — Progress Notes (Signed)
 Order received to complete in and out catherization for urine sample. Patient was cleaned and prepped in a sterle fashion with Betadinex3  A 14 fr catheter foley was inserted. Urine return was note 80 ml. Urine Clear yellow in color. Catheter was then removed. Patient tolerated procedure with no complications were noted.  Performed by Kennyth Lose, CMA/ [facility] LPN  Urine sent for routine urinalysis

## 2023-10-12 NOTE — Patient Instructions (Addendum)
 UTI prevention / management:  Difference between Urinalysis (urine dipstick test) and Urine culture:  Urinalysis (urine dipstick test): A quick office test used as an indicator to determine whether or not further testing is necessary (such as a urine culture, urine microscopy, etc.) The urinalysis cannot differentiate a true bacterial UTI or give a definitive diagnosis for the findings.  Urine culture: May be performed based on the findings of a urinalysis to evaluate for UTI. Grows out on a petri dish for 48-72 hours. Provides important information about: whether or not bacterial growth is present and if so: what the predominant bacteria is which antibiotics will work best against that bacteria That information is important so that we can diagnose and treat patients appropriately as there are other conditions which may mimic UTls which must not be missed (such as cancer, interstitial cystitis, stones, etc.). Assists [place] with antibiotic stewardship to minimize patient's risk for developing antibiotic resistance (getting to a point where no antibiotics work anymore).  Options when UTI symptoms occur: 1. Call [facility] and request to speak with triage nurse (phone # [phone], select option 3). In accordance with clinic guidelines the nurse will determine next steps based on patient-reported symptoms, which may include: same-day lab visit to provide urine specimen, recommendation to schedule Urology office visit appointment for further evaluation, recommendation to proceed to ER, etc. 2. Call your Primary Care Provider (PCP) office to request urgent / same-day visit. Be sure to request for urine culture to be ordered and have results faxed to Urology (fax # [phone]).  3. Go to urgent care. Be sure to request for urine culture to be ordered and have results faxed to Urology (fax # [phone]).   For bladder pain/ burning with urination: - Can take over-the-counter  Pyridium (phenazopyridine; commonly known under the "AZO" brand) for a few days as needed. Limit use to no more than 3 days consecutively due to risk for methemoglobinemia, liver function issues, and bone health damage with long term use of Pyridium. - Alternative: Prescription urinary analgesics (such as Uribel, Urogesic blue, Urelle, Uro-MP). Often expensive / poorly covered by insurance unfortunately.  Options / recommendations for UTI prevention: - Topical vaginal estrogen for vaginal atrophy (aka Genitourinary Syndrome of Menopause (GSM)). - Adequate daily fluid intake to flush out the urinary tract. - Go to the bathroom to urinate every 4-6 hours while awake to minimize urinary stasis / bacterial overgrowth in the bladder. - Proanthocyanidin (PAC) supplement 36 mg daily; must be soluble (insoluble form of PAC will be ineffective). Recommended brand: Ellura. This is an over-the-counter supplement (often must be found/ purchased online) supplement derived from cranberries with concentrated active component: Proanthocyanidin (PAC) 36 mg daily. Decreases bacterial adherence to bladder lining.  - D-mannose powder (2 grams daily). This is an over-the-counter supplement which decreases bacterial adherence to bladder lining (it is a sugar that inhibits bacterial adherence to urothelial cells by binding to the pili of enteric bacteria). Take as per manufacturer recommendation. Can be used as an alternative or in addition to the concentrated cranberry supplement.  - Vitamin C supplement to acidify urine to minimize bacterial growth.  - Probiotic to maintain healthy vaginal microbiome to suppress bacteria at urethral opening. Brand recommendations: Darrold Junker (includes probiotic & D-mannose ), Feminine Balance (highest concentration of lactobacillus) or Hyperbiotic Pro 15.  Note for patients with diabetes:  - Be aware that D-mannose contains sugar.  Note for patients with interstitial cystitis (IC):  -  Patients with IC should  typically avoid cranberry/ PAC supplements and Vitamin C supplements due to their acidity, which may exacerbate IC-related bladder pain. - Symptoms of true bacterial UTI can overlap / mimic symptoms of an IC flare up. Antibiotic use is NOT indicated for IC flare ups. Urine culture needed prior to antibiotic treatment for IC patients. The goal is to minimize your risk for developing antibiotic-resistant bacteria.    Vaginal atrophy I Genitourinary syndrome of menopause (GSM):  What it is: Changes in the vaginal environment (including the vulva and urethra) including: Thinning of the epithelium (skin/ mucosa surface) Can contribute to urinary urgency and frequency Can contribute to dryness, itching, irritation of the vulvar and vaginal tissue Can contribute to pain with intercourse Can contribute to physical changes of the labia, vulva, and vagina such as: Narrowing of the vaginal opening Decreased vaginal length Loss of labial architecture Labial adhesions Pale color of vulvovaginal tissue Loss of pubic hair Allows bacteria to become adherent  Results in increased risk for urinary tract infection (UTI) due to bacterial overgrowth and migration up the urethra into the bladder Change in vaginal pH (acid/ base balance) Allows for alteration / disruption of the normal bacterial flora / microbiome Results in increased risk for urinary tract infection (UTI) due to bacterial overgrowth  Treatment options: Over-the-counter lubricants (see list below). Prescription vaginal estrogen replacement. Options: Topical vaginal estrogen cream Estrace, Premarin, or compounded estradiol cream/ gel We advise: Discard plastic applicator as that tends to use more medication than you need, which is not harmful but wastes / uses up the medication. Also the plastic applicator may cause discomfort. Insert blueberry size amount of medication via the tip of your finger inside vagina  nightly for 1 week then 2-3 times per week (long term). Estring vaginal ring Exchanged every 3 months (either at home or in office by provider) Vagifem vaginal tablet Inserted nightly for 2 weeks then twice a week (long term) lntrarosa vaginal suppository Vaginal DHEA: converts to estrogen in vaginal tissue without systemic effect Inserted nightly (long term) Vaginal laser therapy (Mona Lisa touch) Performed in 3 treatments each 6 weeks apart (available in our [place] office). Can feel like a sunburn for 3-4 days after each treatment until new skin heals in. Usually not covered by insurance. Estimated cost is $1500 for all 3 sessions.  FYI regarding prescription vaginal estrogen treatment options: All topical vaginal estrogen replacement options are equivalent in terms of efficacy. Topical vaginal estrogen replacement will take about 3 months to be effective. OK to have sex with any of the topical vaginal estrogen replacement options. Topical vaginal estrogen replacement may sting/burn initially due to severe dryness, which will improve with ongoing treatment. There have been studies that evaluate use of low-dose intravaginal estrogen that show minimal systemic absorption which is negligible after 3 weeks. There have been no studies indicating increased risk of contributing to cancer development or recurrence.  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 Treatment Comments I Available Products   Lubricants    Water-based Ingredients: Deionized water, glycerin, propylene glycol; latex safe; rare irritation; dry out with extended sexual activity Astroglide, Good Clean Love, K-Y Jelly, Natural, Organic, Pink, Sliquid, Sylk, Yes    Oil Based Ingredients: avocado, olive, peanut, corn; latex safe; can be used with silicone products; staining; safe (unless peanut allergy); non-irritating Coconut  oil, vegetable oil, vitamin E oil  Silicone-Based Ingredients: Silicone polymers; staining; typically nonirritating, long lasting; waterproof; should not be used with silicone dilators, sexual toys, or gynecologic products Astroglide X, Oceanus Ultra Pure, Pink Silicone, Pjur Eros, Replens Silky Smooth, Silicone Premium JO, SKYN, Uberlube, [employer] Based Minimize harm to sperm motility; designed Astroglide TTC, Conceive Plus, Pre for couples trying to conceive Seed, Yes Baby  Fertility Friendly Minimize harm to sperm motility; designed Astroglide, TTC, Conceive Plus, Pre for couples trying to conceive Seed, Yes Baby  Vaginal Moisturizers   Vaginal Moisturizers For maintenance use 1 to 3 times weekly; can benefit women with dryness, chafing with AOL, and recurrent vaginal infections irrespective of sexual activity timing Balance Active Menopause Vaginal Moisturizing Lubricant, Canesintima Intimate Moisturizer, Replens, Rephresh, Sylk Natural Intimate Moisturizer, Yes Vaginal Moisturizer  Hybrids Properties of both water and silicone-based products (combination of a vaginal lubricant and moisturizer); Non-irritating; good option for women with allergies and sensitivities Lubrigyn, Luvena  Suppositories Hyaluronic acid to retain moisture Revaree  Vulvar Soothing Creams/Oils    Medicated CreamsP ain and burn relief; Ingredients: 4% Lidocaine, Aloe Vera gel Releveum ([place])  Non-Medicated Creams For anti-itch and moisture/maintenance; Ingredients: Coconut oil, Avocado oil, Shea Butter,  Olive oil, Vitamin E Vajuvenate, Vmagic  Oils !For moisture/maintenance !Coconut oil, Vitamin E oil, Emu oil                 Overactive bladder (OAB) overview for patients:  Symptoms may include: urinary urgency ("gotta go" feeling) urinary frequency (voiding >8 times per day) night time urination (nocturia) urge incontinence of urine (UUI)  While we do not know the exact etiology of OAB, several treatment options exist including:  Behavioral therapy: Reducing fluid intake Decreasing bladder stimulants (such as caffeine) and irritants (such as acidic food, spicy foods, alcohol) Urge suppression strategies Bladder retraining via timed voiding  Pelvic floor physical therapy  Medication(s) - can use one or both of the drug classes below. Anticholinergic / antimuscarinic medications:  Mechanism of action: Activate M3 receptors to reduce detrusor stimulation and increase bladder capacity  (parasympathetic nervous system). Effect: Relaxes the bladder to decrease overactivity, increase bladder storage capacity, and increase time between voids. Onset: Slow acting (may take 8-12 weeks to determine efficacy). Medications include: Vesicare (Solifenacin), Ditropan (Oxybutynin), Detrol (Tolterodine), Toviaz (Fesoterodine), Sanctura (Trospium), Urispas (Flavoxate), Enablex (Darifenacin), Bentyl (Dicyclomine), Levsin (Hyoscyamine ). Potential side effects include but are not limited to: Dry eyes, dry mouth, constipation, cognitive impairment, dementia risk with long term use, and urinary retention/ incomplete bladder emptying. Insurance companies generally prefer for patients to try 1-2 anticholinergic / antimuscarinic medications first due to low cost. Some exceptions are made based on patient-specific comorbidities / risk factors. Beta-3 agonist medications: Mechanism of action: Stimulates selective B3 adrenergic receptors to cause smooth muscle bladder relaxation (sympathetic nervous  system). Effect: Relaxes the bladder to decrease overactivity, increase bladder storage capacity, and increase time between voids. Onset: Slow acting (may take 8-12 weeks to determine efficacy). Medications include: Myrbetriq (Mirabegron) and Vibegron Leslye Peer). Potential side effects include but are not limited to: urinary retention / incomplete bladder emptying and elevated blood pressure (more likely to occur in individuals with pre-existing uncontrolled hypertension). These medications tend to be more expensive than the anticholinergic / antimuscarinic medications.   For patients with refractory OAB (if the above treatment options have been unsuccessful): Posterior tibial nerve stimulation (PTNS). Small acupuncture-type needle inserted near ankle with electric current to stimulate bladder via posterior tibial nerve  pathway. Initially requires 12 weekly in-office treatments lasting 30 minutes each; followed by monthly in-office treatments lasting 30 minutes each for 1 year.  Bladder Botox injections. How it is done: Typically done via in-office cystoscopy; sometimes done in the OR depending on the situation. The bladder is numbed with lidocaine instilled via a catheter. Then the urologist injects Botox into the bladder muscle wall in about 20 locations. Causes local paralysis of the bladder muscle at the injection sites to reduce bladder muscle overactivity / spasms. The effect lasts for approximately 6 months and cannot be reversed once performed. Risks may included but are not limited to: infection, incomplete bladder emptying/ urinary retention, short term need for self-catheterization or indwelling catheter, and need for repeat therapy. There is a 5-12% chance of needing to catheterize with Botox - that usually resolves in a few months as the Botox wears off. Typically Botox injections would need to be repeated every 3-12 months since this is not a permanent therapy.  Sacral neuromodulation  trial (Medtronic lnterStim or Axonics implant). Sacral neuromodulation is FDA-approved for uncontrolled urinary urgency, urinary frequency, urinary urge incontinence, non-obstructive urinary retention, or fecal incontinence. It is not FDA-approved as a treatment for pain. The goal of this therapy is at least a 50% improvement in symptoms. It is NOT realistic to expect a 100% cure. This is a a 2-step outpatient procedure. After a successful test period, a permanent wire and generator are placed in the OR. We discussed the risk of infection. We reviewed the fact that about 30% of patients fail the test phase and are not candidates for permanent generator placement. During the 1-2 week trial phase, symptoms are documented by the patient to determine response. If patient gets at least a 50% improvement in symptoms, they may then proceed with Step 2. Step 1: Trial lead placement. Per physician discretion, may done one of two ways: Percutaneous nerve evaluation (PNE) in the [facility] urology office. Performed by urologist under local anesthesia (numbing the area with lidocaine) using a spinal needle for placement of test wire, which usually stays in place for 5-7 days to determine therapy response. Test lead placement in OR under anesthesia. Usually stays in place 2 weeks to determine therapy response. > Step 2: Permanent implantation of sacral neuromodulation device, which is performed in the OR.  Sacral neuromodulation implants: All are conditionally MRI safe. Manufacturer: Medtronic Website: BuffaloDryCleaner.gl therapy/right-for-you.html Options: lnterStim X: Non-rechargeable. The battery lasts 10 years on average. lnterStim Micro: Rechargeable. The battery lasts 15 years on average and must be charged routinely. Approximately 50% smaller implant than lnterStim X implant.  Manufacturer: Axonics Website:  Findrealrelief.axonics.com Options: Non-rechargeable (Axonics F15): The battery lasts 15 years on average. Rechargeable (Axonics R20): The battery lasts 20 years on average and must be charged in office for about 1 hour every 6-10 months on average. Approximately 50% smaller implant than Axonics non-rechargeable implant.  Note: Generally the rechargeable devices are only advised for very small or thin patients who may not have sufficient adipose tissue to comfortably overlay the implanted device.  Suprapubic catheter (SP tube) placement. Only done in severely refractory OAB when all other options have failed or are not a viable treatment choice depending on patient factors. Involves placement of a catheter through the lower abdomen into the bladder to continuously drain the bladder into an external collection bag, which patient can then empty at their convenience every few hours. Done via an outpatient surgical procedure in the OR under anesthesia. Risks may included but  are not limited to: surgical site pain, infections, skin irritation / breakdown, chronic bacteriuria, symptomatic UTls. The SP tube must stay in place continuously. This is a reversible procedure however - the insertion site will close if catheter is removed for more than a few hours. The SP tube must be exchanged routinely every 4 weeks to prevent the catheter from becoming clogged with sediment. SP tube exchanges are typically performed at a urology nurse visit or by a home health nurse.

## 2023-10-14 ENCOUNTER — Telehealth: Payer: Self-pay

## 2023-10-14 LAB — URINE CULTURE

## 2023-10-14 NOTE — Telephone Encounter (Signed)
-----   Message from Tracey Juarez sent at 10/14/2023 10:37 AM EST ----- Please notify patient: Negative urine culture, no antibiotic needed at this time. Thanks.

## 2023-10-14 NOTE — Telephone Encounter (Signed)
Patient was made aware and verbalizes understanding.

## 2023-10-14 NOTE — Telephone Encounter (Signed)
 Patient was made aware and verbalize understanding.

## 2023-11-19 ENCOUNTER — Other Ambulatory Visit: Payer: Self-pay | Admitting: Family Medicine

## 2023-11-19 DIAGNOSIS — E7800 Pure hypercholesterolemia, unspecified: Secondary | ICD-10-CM

## 2023-11-21 ENCOUNTER — Other Ambulatory Visit: Payer: Self-pay | Admitting: Family Medicine

## 2023-11-21 DIAGNOSIS — E7800 Pure hypercholesterolemia, unspecified: Secondary | ICD-10-CM

## 2023-11-24 ENCOUNTER — Telehealth: Payer: Self-pay | Admitting: Urology

## 2023-11-24 ENCOUNTER — Ambulatory Visit: Admitting: Urology

## 2023-11-24 ENCOUNTER — Other Ambulatory Visit: Payer: Self-pay

## 2023-11-24 DIAGNOSIS — G20A2 Parkinson's disease without dyskinesia, with fluctuations: Secondary | ICD-10-CM

## 2023-11-24 DIAGNOSIS — N3941 Urge incontinence: Secondary | ICD-10-CM

## 2023-11-24 DIAGNOSIS — R262 Difficulty in walking, not elsewhere classified: Secondary | ICD-10-CM

## 2023-11-24 DIAGNOSIS — R3915 Urgency of urination: Secondary | ICD-10-CM

## 2023-11-24 DIAGNOSIS — N390 Urinary tract infection, site not specified: Secondary | ICD-10-CM

## 2023-11-24 DIAGNOSIS — N3281 Overactive bladder: Secondary | ICD-10-CM

## 2023-11-24 MED ORDER — GEMTESA 75 MG PO TABS: 1.0000 | ORAL_TABLET | Freq: Every day | ORAL | 0 refills | Status: DC

## 2023-11-24 NOTE — Telephone Encounter (Signed)
 30 day rx sent to pharmacy to get her until her next appt with NP.

## 2023-11-24 NOTE — Telephone Encounter (Signed)
 Needs Gemtesa RX sent to [facility], she had an appt today with Isa Manuel and we had to move it to May 21

## 2023-12-16 ENCOUNTER — Ambulatory Visit: Payer: Medicare PPO

## 2023-12-16 VITALS — BP 161/72 | HR 60 | Ht 62.0 in | Wt 113.0 lb

## 2023-12-16 DIAGNOSIS — Z0000 Encounter for general adult medical examination without abnormal findings: Secondary | ICD-10-CM

## 2023-12-16 NOTE — Patient Instructions (Signed)
 Ms. Arlt , Thank you for taking time to come for your Medicare Wellness Visit. I appreciate your ongoing commitment to your health goals. Please review the following plan we discussed and let me know if I can assist you in the future.   Referrals/Orders/Follow-Ups/Clinician Recommendations: n/a  This is a list of the screening recommended for you and due dates:  Health Maintenance  Topic Date Due   Pap Smear  07/15/2013   DEXA scan (bone density measurement)  12/26/2022   COVID-19 Vaccine (5 - 2024-25 season) 12/31/2024*   Mammogram  03/09/2024   Flu Shot  03/11/2024   Medicare Annual Wellness Visit  12/15/2024   DTaP/Tdap/Td vaccine (3 - Td or Tdap) 09/19/2031   Pneumonia Vaccine  Completed   Zoster (Shingles) Vaccine  Completed   HPV Vaccine  Aged Out   Meningitis B Vaccine  Aged Out  *Topic was postponed. The date shown is not the original due date.    Advanced directives: (Copy Requested) Please bring a copy of your health care power of attorney and living will to the office to be added to your chart at your convenience. You can mail to [facility] [address] [place], [place] [address] or email to [email or web address]  Next Medicare Annual Wellness Visit scheduled for next year: Yes

## 2023-12-16 NOTE — Progress Notes (Signed)
 Subjective:   Tracey Juarez is a 83 y.o. who presents for a Medicare Wellness preventive visit.  Visit Complete: Virtual I connected with  Noella Baton on 12/16/23 by a audio enabled telemedicine application and verified that I am speaking with the correct person using two identifiers.  Patient Location: Home  Provider Location: Home Office  I discussed the limitations of evaluation and management by telemedicine. The patient expressed understanding and agreed to proceed.  Vital Signs: Because this visit was a virtual/telehealth visit, some criteria may be missing or patient reported. Any vitals not documented were not able to be obtained and vitals that have been documented are patient reported.  VideoDeclined- This patient declined Librarian, academic. Therefore the visit was completed with audio only.  Persons Participating in Visit: Patient.  AWV Questionnaire: No: Patient Medicare AWV questionnaire was not completed prior to this visit.  Cardiac Risk Factors include: advanced age (>38men, >83 women);hypertension     Objective:    Today's Vitals   12/16/23 0943  BP: (!) 161/72  Pulse: 60  Weight: 113 lb (51.3 kg)  Height: 5\' 2"  (1.575 m)   Body mass index is 20.67 kg/m.     12/16/2023    9:33 AM 07/21/2023    5:12 PM 07/21/2023    9:15 AM 12/10/2022   12:10 PM 12/06/2021   12:09 PM 05/13/2021   10:21 AM 02/12/2021   10:24 AM  Advanced Directives  Does Patient Have a Medical Advance Directive? Yes Yes Yes No Yes Yes Yes  Type of Estate agent of [place];Living will Healthcare Power of [place];Living will Healthcare Power of [place];Living will  Healthcare Power of [place];Living will  Healthcare Power of [place];Living will  Does patient want to make changes to medical advance directive?  No - Patient declined No - Patient declined      Copy of Healthcare Power of Attorney in Chart? No - copy requested  No - copy requested No - copy requested  No - copy requested    Would patient like information on creating a medical advance directive?  No - Patient declined  No - Patient declined       Current Medications (verified) Outpatient Encounter Medications as of 12/16/2023  Medication Sig   acetaminophen  (TYLENOL ) 325 MG tablet Take 650 mg by mouth every 6 (six) hours as needed.   Carbidopa -Levodopa  ER (SINEMET  CR) 25-100 MG tablet controlled release Take 1 tablet by mouth 3 (three) times daily.   cetirizine (ZYRTEC) 10 MG tablet Take 10 mg by mouth daily.   clopidogrel  (PLAVIX ) 75 MG tablet Take 1 tablet (75 mg total) by mouth daily. Restart on 08/01/23   estradiol  (ESTRACE ) 0.1 MG/GM vaginal cream Discard plastic applicator. Insert a blueberry size amount (approximately 1 gram) of cream on fingertip inside vagina at bedtime every night for 1 week then every other night. For long term use.   famotidine  (PEPCID ) 10 MG tablet Take 10 mg by mouth daily.   feeding supplement (BOOST HIGH PROTEIN) LIQD Take 1 Container by mouth 3 (three) times daily between meals.   fluticasone  (FLONASE ) 50 MCG/ACT nasal spray USE 1 SPRAY(S) IN EACH NOSTRIL TWICE DAILY AS NEEDED FOR ALLERGIES OR RHINITIS   levETIRAcetam  (KEPPRA ) 500 MG tablet Take 1 tablet (500 mg total) by mouth 2 (two) times daily.   lisinopril  (ZESTRIL ) 5 MG tablet Take 1 tablet (5 mg total) by mouth daily. (Patient taking differently: Take 2.5 mg by mouth daily. Takes few  times per week)   meclizine  (ANTIVERT ) 12.5 MG tablet TAKE 1 TO 2 TABLETS BY MOUTH THREE TIMES DAILY AS NEEDED FOR DIZZINESS   rosuvastatin  (CRESTOR ) 10 MG tablet Take 1 tablet by mouth once daily   Vibegron  (GEMTESA ) 75 MG TABS Take 1 tablet (75 mg total) by mouth daily.   No facility-administered encounter medications on file as of 12/16/2023.    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   Detrusor instability    Elevated cholesterol    GERD (gastroesophageal reflux disease)    Hot flashes    Humerus head fracture, left, with routine healing, subsequent encounter 2000   Hypertension    Melanoma (HCC)    Mixed basal-squamous cell carcinoma    skin   Parkinson disease (HCC)    Personal history of radiation therapy    Stroke (HCC)    on Plavix , no deficits   Past Surgical History:  Procedure Laterality Date   BRAIN SURGERY     BREAST LUMPECTOMY Left 04/2015   radiation   BREAST LUMPECTOMY WITH RADIOACTIVE SEED AND SENTINEL LYMPH NODE BIOPSY Left 04/25/2015   Procedure: LEFT BREAST PARTIAL MASTECTOMY WITH RADIOACTIVE SEED AND SENTINEL LYMPH NODE MAPPING;  Surgeon: Sim Dryer, MD;  Location: [facility];  Service: General;  Laterality: Left;   FOOT SURGERY  2004   KNEE SURGERY  2007   skin cancer excised     TONSILLECTOMY     Family History  Problem Relation Age of Onset   Hypertension Mother    Heart attack Mother    Heart attack Maternal Aunt    Heart attack Maternal Uncle    Breast cancer Neg Hx    Social History   Socioeconomic History   Marital status: Married    Spouse name: Adaline Holly    Number of children: 0   Years of education: ged   Highest education level: GED or equivalent  Occupational History   Occupation: Retired  Tobacco Use   Smoking status: Never   Smokeless tobacco: Never  Vaping Use   Vaping status: Never Used  Substance and Sexual Activity   Alcohol use: No    Alcohol/week: 0.0 standard drinks of alcohol   Drug use: No   Sexual activity: Never    Birth control/protection: Post-menopausal    Comment: 1st intercourse 16 yo-2 partners  Other Topics Concern   Not on file  Social History Narrative   Not on file   Social Drivers of Health   Financial Resource Strain: Low Risk  (12/16/2023)   Overall Financial  Resource Strain (CARDIA)    Difficulty of Paying Living Expenses: Not hard at all  Food Insecurity: No Food Insecurity (12/16/2023)   Hunger Vital Sign    Worried About Running Out of Food in the Last Year: Never true    Ran Out of Food in the Last Year: Never true  Transportation Needs: No Transportation Needs (12/16/2023)   PRAPARE - Administrator, Civil Service (Medical): No    Lack of Transportation (Non-Medical): No  Physical Activity: Insufficiently Active (12/16/2023)   Exercise Vital Sign    Days of Exercise per Week: 3 days    Minutes of Exercise per Session: 20 min  Stress: No Stress Concern Present (12/16/2023)   [employer]  of Occupational Health - Occupational Stress Questionnaire    Feeling of Stress : Only a little  Social Connections: Moderately Isolated (12/16/2023)   Social Connection and Isolation Panel [NHANES]    Frequency of Communication with Friends and Family: More than three times a week    Frequency of Social Gatherings with Friends and Family: More than three times a week    Attends Religious Services: More than 4 times per year    Active Member of [employer] or Organizations: No    Attends Banker Meetings: Never    Marital Status: Widowed    Tobacco Counseling Counseling given: Yes    Clinical Intake:  Pre-visit preparation completed: Yes  Pain : No/denies pain     BMI - recorded: 20.67 Nutritional Status: BMI of 19-24  Normal Nutritional Risks: None Diabetes: No  No results found for: "HGBA1C"   How often do you need to have someone help you when you read instructions, pamphlets, or other written materials from your doctor or pharmacy?: 1 - Never  Interpreter Needed?: No  Information entered by :: Alia T/cma   Activities of Daily Living     12/16/2023    9:30 AM 07/21/2023    5:12 PM  In your present state of health, do you have any difficulty performing the following activities:  Hearing? 1 0  Vision? 0 0   Difficulty concentrating or making decisions? 0 0  Walking or climbing stairs? 1   Dressing or bathing? 0   Doing errands, shopping? 1 0  Comment neighbor -Jenette Mitchell drives pt to /from Dr. office   Preparing Food and eating ? Y   Comment pt's neighbor helps w/preparing food   Using the Toilet? N   In the past six months, have you accidently leaked urine? Y   Do you have problems with loss of bowel control? N   Managing your Medications? N   Managing your Finances? N   Housekeeping or managing your Housekeeping? N   Comment pt's neighbor-Wendy helps w/house cleaning     Patient Care Team: Dettinger, Lucio Sabin, MD as PCP - General (Family Medicine) Auther Bo, RN as Registered Nurse Elmo Haff., MD as Referring Physician (Neurology) Gala Jubilee, MD as Referring Physician (Neurology) Dr Pat[facility], OD (Optometry)  Indicate any recent Medical Services you may have received from other than [facility] providers in the past year (date may be approximate).     Assessment:   This is a routine wellness examination for [place].  Hearing/Vision screen Hearing Screening - Comments:: Pt stated yes Vision Screening - Comments:: Pt denies vision w/glasses   Goals Addressed             This Visit's Progress    Patient Stated       Pt would like to get back to driving       Depression Screen     12/16/2023    9:36 AM 09/10/2023    2:53 PM 06/19/2023    3:02 PM 02/18/2023    1:19 PM 12/10/2022   12:10 PM 09/22/2022   10:43 AM 09/15/2022    8:22 AM  PHQ 2/9 Scores  PHQ - 2 Score 0 0 0 0 0 0 0  PHQ- 9 Score 4  0 1  3     Fall Risk     12/16/2023    9:29 AM 09/10/2023    2:53 PM 06/19/2023    3:02 PM 02/18/2023    1:19  PM 12/10/2022   12:08 PM  Fall Risk   Falls in the past year? 1 0 1 0 0  Number falls in past yr: 1  0  0  Injury with Fall? 1  1  0  Comment   fx hip    Risk for fall due to : Impaired balance/gait;Impaired mobility  Impaired  balance/gait;Impaired mobility  No Fall Risks  Follow up   Falls evaluation completed  Falls prevention discussed    MEDICARE RISK AT HOME:  Medicare Risk at Home Any stairs in or around the home?: Yes If so, are there any without handrails?: Yes Home free of loose throw rugs in walkways, pet beds, electrical cords, etc?: Yes Adequate lighting in your home to reduce risk of falls?: Yes Life alert?: No Use of a cane, walker or w/c?: Yes (walker) Grab bars in the bathroom?: Yes Shower chair or bench in shower?: Yes Elevated toilet seat or a handicapped toilet?: Yes  TIMED UP AND GO:  Was the test performed?  no  Cognitive Function: 6CIT completed        12/16/2023    9:40 AM 12/10/2022   12:11 PM 12/06/2021   12:09 PM 11/30/2020   10:44 AM 11/24/2019   11:00 AM  6CIT Screen  What Year? 0 points 0 points 0 points 0 points 0 points  What month? 0 points 0 points 0 points 0 points 0 points  What time? 0 points 0 points 0 points 0 points 0 points  Count back from 20 0 points 0 points 0 points 0 points 0 points  Months in reverse 0 points 0 points 0 points 0 points 0 points  Repeat phrase 0 points 0 points 0 points 0 points 0 points  Total Score 0 points 0 points 0 points 0 points 0 points    Immunizations Immunization History  Administered Date(s) Administered   Fluad Trivalent(High Dose 65+) 06/19/2023   Influenza, High Dose Seasonal PF 05/09/2013, 05/08/2014, 05/20/2017, 05/26/2018, 05/03/2019, 05/27/2021   Influenza,inj,Quad PF,6+ Mos 05/15/2015, 06/04/2022, 06/04/2022   Influenza-Unspecified 05/15/2015, 05/07/2016, 05/20/2017, 05/03/2019   Moderna SARS-COV2 Booster Vaccination 03/04/2021   Moderna Sars-Covid-2 Vaccination 08/31/2019, 09/28/2019, 06/04/2020   Pfizer Covid-19 Vaccine Bivalent Booster 44yrs & up 06/06/2021   Pneumococcal Conjugate-13 08/15/2013   Pneumococcal Polysaccharide-23 12/18/2017   Tdap 06/02/2011, 09/18/2021   Zoster Recombinant(Shingrix ) 11/19/2020,  09/18/2021    Screening Tests Health Maintenance  Topic Date Due   Cervical Cancer Screening (Pap smear)  07/15/2013   DEXA SCAN  12/26/2022   COVID-19 Vaccine (5 - 2024-25 season) 12/31/2024 (Originally 04/12/2023)   MAMMOGRAM  03/09/2024   INFLUENZA VACCINE  03/11/2024   Medicare Annual Wellness (AWV)  12/15/2024   DTaP/Tdap/Td (3 - Td or Tdap) 09/19/2031   Pneumonia Vaccine 66+ Years old  Completed   Zoster Vaccines- Shingrix   Completed   HPV VACCINES  Aged Out   Meningococcal B Vaccine  Aged Out    Health Maintenance  Health Maintenance Due  Topic Date Due   Cervical Cancer Screening (Pap smear)  07/15/2013   DEXA SCAN  12/26/2022   Health Maintenance Items Addressed: See Nurse Notes  Additional Screening:  Vision Screening: Recommended annual ophthalmology exams for early detection of glaucoma and other disorders of the eye.  Dental Screening: Recommended annual dental exams for proper oral hygiene  Community Resource Referral / Chronic Care Management: CRR required this visit?  No   CCM required this visit?  No     Plan:  I have personally reviewed and noted the following in the patient's chart:   Medical and social history Use of alcohol, tobacco or illicit drugs  Current medications and supplements including opioid prescriptions. Patient is not currently taking opioid prescriptions. Functional ability and status Nutritional status Physical activity Advanced directives List of other physicians Hospitalizations, surgeries, and ER visits in previous 12 months Vitals Screenings to include cognitive, depression, and falls Referrals and appointments  In addition, I have reviewed and discussed with patient certain preventive protocols, quality metrics, and best practice recommendations. A written personalized care plan for preventive services as well as general preventive health recommendations were provided to patient.     Michaelle Adolphus,  CMA   12/16/2023   After Visit Summary: (MyChart) Due to this being a telephonic visit, the after visit summary with patients personalized plan was offered to patient via MyChart   Notes: Nothing significant to report at this time.

## 2023-12-17 ENCOUNTER — Ambulatory Visit (INDEPENDENT_AMBULATORY_CARE_PROVIDER_SITE_OTHER): Payer: Medicare PPO

## 2023-12-17 ENCOUNTER — Encounter: Payer: Self-pay | Admitting: Family Medicine

## 2023-12-17 ENCOUNTER — Ambulatory Visit (INDEPENDENT_AMBULATORY_CARE_PROVIDER_SITE_OTHER): Payer: Medicare PPO | Admitting: Family Medicine

## 2023-12-17 VITALS — BP 151/67 | HR 71 | Ht 62.0 in | Wt 114.0 lb

## 2023-12-17 DIAGNOSIS — G20A2 Parkinson's disease without dyskinesia, with fluctuations: Secondary | ICD-10-CM | POA: Diagnosis not present

## 2023-12-17 DIAGNOSIS — E7800 Pure hypercholesterolemia, unspecified: Secondary | ICD-10-CM | POA: Diagnosis not present

## 2023-12-17 DIAGNOSIS — I10 Essential (primary) hypertension: Secondary | ICD-10-CM | POA: Diagnosis not present

## 2023-12-17 DIAGNOSIS — Z780 Asymptomatic menopausal state: Secondary | ICD-10-CM | POA: Diagnosis not present

## 2023-12-17 LAB — LIPID PANEL

## 2023-12-17 MED ORDER — ROSUVASTATIN CALCIUM 10 MG PO TABS: 10.0000 mg | ORAL_TABLET | Freq: Every day | ORAL | 0 refills | Status: DC

## 2023-12-17 MED ORDER — LISINOPRIL 5 MG PO TABS: 5.0000 mg | ORAL_TABLET | Freq: Every day | ORAL | 3 refills | Status: AC

## 2023-12-17 NOTE — Progress Notes (Signed)
 BP (!) 151/67   Pulse 71   Ht 5\' 2"  (1.575 m)   Wt 114 lb (51.7 kg)   SpO2 99%   BMI 20.85 kg/m    Subjective:   Patient ID: Tracey Juarez, female    DOB: [birth year], 83 y.o.   MRN: [id number]  HPI: Tracey Juarez is a 83 y.o. female presenting on 12/17/2023 for Medical Management of Chronic Issues and Hypertension   HPI Parkinson's disease With her Parkinson's she is getting weaker and now that her husband passed away she is more at home and she said the other day she even had trouble getting up out of her recliner at home.  She does have some neighbors that she depends on and they do help her.  Her balance is significantly off she is at high risk for falling too.  Hypertension Patient is currently on lisinopril , and their blood pressure today is 151/67. Patient denies any lightheadedness or dizziness. Patient denies headaches, blurred vision, chest pains, shortness of breath, or weakness. Denies any side effects from medication and is content with current medication.   Hyperlipidemia Patient is coming in for recheck of his hyperlipidemia. The patient is currently taking Crestor . They deny any issues with myalgias or history of liver damage from it. They deny any focal numbness or weakness or chest pain.   Relevant past medical, surgical, family and social history reviewed and updated as indicated. Interim medical history since our last visit reviewed. Allergies and medications reviewed and updated.  Review of Systems  Constitutional:  Negative for chills and fever.  HENT:  Negative for congestion, ear discharge and ear pain.   Eyes:  Negative for redness and visual disturbance.  Respiratory:  Negative for chest tightness and shortness of breath.   Cardiovascular:  Negative for chest pain and leg swelling.  Genitourinary:  Negative for difficulty urinating and dysuria.  Musculoskeletal:  Negative for back pain and gait problem.  Skin:  Negative for rash.   Neurological:  Positive for weakness. Negative for light-headedness and headaches.  Psychiatric/Behavioral:  Negative for agitation and behavioral problems.   All other systems reviewed and are negative.   Per HPI unless specifically indicated above   Allergies as of 12/17/2023       Reactions   Cat Dander    Ondansetron  Swelling, Palpitations        Medication List        Accurate as of Dec 17, 2023  1:25 PM. If you have any questions, ask your nurse or doctor.          acetaminophen  325 MG tablet Commonly known as: TYLENOL  Take 650 mg by mouth every 6 (six) hours as needed.   Carbidopa -Levodopa  ER 25-100 MG tablet controlled release Commonly known as: SINEMET  CR Take 1 tablet by mouth 3 (three) times daily.   cetirizine 10 MG tablet Commonly known as: ZYRTEC Take 10 mg by mouth daily.   clopidogrel  75 MG tablet Commonly known as: PLAVIX  Take 1 tablet (75 mg total) by mouth daily. Restart on 08/01/23   estradiol  0.1 MG/GM vaginal cream Commonly known as: ESTRACE  Discard plastic applicator. Insert a blueberry size amount (approximately 1 gram) of cream on fingertip inside vagina at bedtime every night for 1 week then every other night. For long term use.   famotidine  10 MG tablet Commonly known as: PEPCID  Take 10 mg by mouth daily.   feeding supplement Liqd Take 1 Container by mouth 3 (three) times daily between meals.  fluticasone  50 MCG/ACT nasal spray Commonly known as: FLONASE  USE 1 SPRAY(S) IN EACH NOSTRIL TWICE DAILY AS NEEDED FOR ALLERGIES OR RHINITIS   Gemtesa  75 MG Tabs Generic drug: Vibegron  Take 1 tablet (75 mg total) by mouth daily.   levETIRAcetam  500 MG tablet Commonly known as: KEPPRA  Take 1 tablet (500 mg total) by mouth 2 (two) times daily.   lisinopril  5 MG tablet Commonly known as: ZESTRIL  Take 1 tablet (5 mg total) by mouth daily. What changed:  how much to take additional instructions   meclizine  12.5 MG tablet Commonly  known as: ANTIVERT  TAKE 1 TO 2 TABLETS BY MOUTH THREE TIMES DAILY AS NEEDED FOR DIZZINESS   rosuvastatin  10 MG tablet Commonly known as: CRESTOR  Take 1 tablet (10 mg total) by mouth daily.         Objective:   BP (!) 151/67   Pulse 71   Ht 5\' 2"  (1.575 m)   Wt 114 lb (51.7 kg)   SpO2 99%   BMI 20.85 kg/m   Wt Readings from Last 3 Encounters:  12/17/23 114 lb (51.7 kg)  12/16/23 113 lb (51.3 kg)  09/10/23 113 lb (51.3 kg)    Physical Exam Vitals and nursing note reviewed.  Constitutional:      General: She is not in acute distress.    Appearance: She is well-developed. She is not diaphoretic.  Eyes:     Conjunctiva/sclera: Conjunctivae normal.  Cardiovascular:     Rate and Rhythm: Normal rate and regular rhythm.     Heart sounds: Normal heart sounds. No murmur heard. Pulmonary:     Effort: Pulmonary effort is normal. No respiratory distress.     Breath sounds: Normal breath sounds. No wheezing.  Musculoskeletal:     Comments: 4 out of 5 strength in all 4 extremities, off balance with gait.  Skin:    General: Skin is warm and dry.     Findings: No rash.  Neurological:     Mental Status: She is alert and oriented to person, place, and time.     Coordination: Coordination normal.  Psychiatric:        Behavior: Behavior normal.       Assessment & Plan:   Problem List Items Addressed This Visit       Cardiovascular and Mediastinum   Hypertension   Relevant Medications   lisinopril  (ZESTRIL ) 5 MG tablet   rosuvastatin  (CRESTOR ) 10 MG tablet   Other Relevant Orders   CBC with Differential/Platelet   CMP14+EGFR     Nervous and Auditory   Parkinson disease (HCC)   Relevant Orders   Ambulatory referral to Physical Therapy     Other   Pure hypercholesterolemia - Primary   Relevant Medications   lisinopril  (ZESTRIL ) 5 MG tablet   rosuvastatin  (CRESTOR ) 10 MG tablet   Other Relevant Orders   CBC with Differential/Platelet   Lipid panel    Discussed  the possibility of assisted living placement, also discussed the possibility of her obtaining a life alert button and doing exercises and therapies to strengthen her core at home. Follow up plan: Return in about 6 months (around 06/18/2024), or if symptoms worsen or fail to improve, for Hypertension and hyperlipidemia and Parkinson's.  Counseling provided for all of the vaccine components Orders Placed This Encounter  Procedures   CBC with Differential/Platelet   CMP14+EGFR   Lipid panel   Ambulatory referral to Physical Therapy    Jolyne Needs, MD [facility] 12/17/2023, 1:25  PM

## 2023-12-18 ENCOUNTER — Other Ambulatory Visit: Payer: Self-pay | Admitting: Family Medicine

## 2023-12-18 ENCOUNTER — Encounter: Payer: Self-pay | Admitting: Family Medicine

## 2023-12-18 DIAGNOSIS — M810 Age-related osteoporosis without current pathological fracture: Secondary | ICD-10-CM

## 2023-12-18 LAB — CMP14+EGFR
ALT: 5 IU/L (ref 0–32)
AST: 25 IU/L (ref 0–40)
Albumin: 4.4 g/dL (ref 3.7–4.7)
Alkaline Phosphatase: 80 IU/L (ref 44–121)
BUN/Creatinine Ratio: 27 (ref 12–28)
BUN: 21 mg/dL (ref 8–27)
Bilirubin Total: 0.4 mg/dL (ref 0.0–1.2)
CO2: 26 mmol/L (ref 20–29)
Calcium: 9.9 mg/dL (ref 8.7–10.3)
Chloride: 103 mmol/L (ref 96–106)
Creatinine, Ser: 0.78 mg/dL (ref 0.57–1.00)
Globulin, Total: 1.9 g/dL (ref 1.5–4.5)
Glucose: 90 mg/dL (ref 70–99)
Potassium: 4.4 mmol/L (ref 3.5–5.2)
Sodium: 141 mmol/L (ref 134–144)
Total Protein: 6.3 g/dL (ref 6.0–8.5)
eGFR: 76 mL/min/{1.73_m2} (ref >59)

## 2023-12-18 LAB — CBC WITH DIFFERENTIAL/PLATELET
Basophils Absolute: 0 10*3/uL (ref 0.0–0.2)
Basos: 1 %
EOS (ABSOLUTE): 0 10*3/uL (ref 0.0–0.4)
Eos: 1 %
Hematocrit: 37.8 % (ref 34.0–46.6)
Hemoglobin: 12.1 g/dL (ref 11.1–15.9)
Immature Grans (Abs): 0 10*3/uL (ref 0.0–0.1)
Immature Granulocytes: 0 %
Lymphocytes Absolute: 0.9 10*3/uL (ref 0.7–3.1)
Lymphs: 21 %
MCH: 31 pg (ref 26.6–33.0)
MCHC: 32 g/dL (ref 31.5–35.7)
MCV: 97 fL (ref 79–97)
Monocytes Absolute: 0.6 10*3/uL (ref 0.1–0.9)
Monocytes: 13 %
Neutrophils Absolute: 2.8 10*3/uL (ref 1.4–7.0)
Neutrophils: 64 %
Platelets: 229 10*3/uL (ref 150–450)
RBC: 3.9 x10E6/uL (ref 3.77–5.28)
RDW: 13.2 % (ref 11.7–15.4)
WBC: 4.3 10*3/uL (ref 3.4–10.8)

## 2023-12-18 LAB — LIPID PANEL
Chol/HDL Ratio: 2.2 ratio (ref 0.0–4.4)
Cholesterol, Total: 171 mg/dL (ref 100–199)
HDL: 78 mg/dL (ref >39)
LDL Chol Calc (NIH): 82 mg/dL (ref 0–99)
Triglycerides: 54 mg/dL (ref 0–149)
VLDL Cholesterol Cal: 11 mg/dL (ref 5–40)

## 2023-12-19 ENCOUNTER — Other Ambulatory Visit: Payer: Self-pay | Admitting: Urology

## 2023-12-19 DIAGNOSIS — R3915 Urgency of urination: Secondary | ICD-10-CM

## 2023-12-19 DIAGNOSIS — N3941 Urge incontinence: Secondary | ICD-10-CM

## 2023-12-19 DIAGNOSIS — G20A2 Parkinson's disease without dyskinesia, with fluctuations: Secondary | ICD-10-CM

## 2023-12-19 DIAGNOSIS — N390 Urinary tract infection, site not specified: Secondary | ICD-10-CM

## 2023-12-19 DIAGNOSIS — R262 Difficulty in walking, not elsewhere classified: Secondary | ICD-10-CM

## 2023-12-19 DIAGNOSIS — N3281 Overactive bladder: Secondary | ICD-10-CM

## 2023-12-23 ENCOUNTER — Ambulatory Visit: Payer: Self-pay | Admitting: Family Medicine

## 2023-12-24 ENCOUNTER — Telehealth: Payer: Self-pay

## 2023-12-24 ENCOUNTER — Telehealth: Payer: Self-pay | Admitting: Urology

## 2023-12-24 NOTE — Telephone Encounter (Signed)
 Patient calling the office for samples of medication:   1.  What medication and dosage are you requesting samples for? Gemtesa   2.  Are you currently out of this medication?  Yes and she can't afford to pick up refill.  Is there something else she can take ?

## 2023-12-24 NOTE — Telephone Encounter (Signed)
 Patient is made aware Gemtesa  samples up front. Patient was given patient support program for Gemtesa . Patient voiced understanding.

## 2023-12-24 NOTE — Progress Notes (Signed)
 Care Guide Pharmacy Note  12/24/2023 Name: Tiernan Odonald MRN: [id number] DOB: 01/26/1941  Referred By: Hilton Lucky, MD Reason for referral: Complex Care Management (Outreach to schedule with Pharm d )   Casondra Vanderhyde is a 83 y.o. year old female who is a primary care patient of Dettinger, Lucio Sabin, MD.  Deeann Fare Fracassi was referred to the pharmacist for assistance related to: osteoporosis  Successful contact was made with the patient to discuss pharmacy services including being ready for the pharmacist to call at least 5 minutes before the scheduled appointment time and to have medication bottles and any blood pressure readings ready for review. The patient agreed to meet with the pharmacist via telephone visit on (date/time).01/20/2024  Lenton Rail , RMA     [place]  [facility], [facility] Guide  Direct Dial: [phone]  Website: .[email or web address]

## 2023-12-25 ENCOUNTER — Telehealth: Payer: Self-pay

## 2023-12-25 ENCOUNTER — Other Ambulatory Visit: Payer: Self-pay

## 2023-12-25 DIAGNOSIS — N3941 Urge incontinence: Secondary | ICD-10-CM

## 2023-12-25 DIAGNOSIS — R3915 Urgency of urination: Secondary | ICD-10-CM

## 2023-12-25 DIAGNOSIS — N3281 Overactive bladder: Secondary | ICD-10-CM

## 2023-12-25 DIAGNOSIS — G20A2 Parkinson's disease without dyskinesia, with fluctuations: Secondary | ICD-10-CM

## 2023-12-25 DIAGNOSIS — N390 Urinary tract infection, site not specified: Secondary | ICD-10-CM

## 2023-12-25 DIAGNOSIS — R262 Difficulty in walking, not elsewhere classified: Secondary | ICD-10-CM

## 2023-12-25 MED ORDER — GEMTESA 75 MG PO TABS: 1.0000 | ORAL_TABLET | Freq: Every day | ORAL | 3 refills | Status: DC

## 2023-12-25 NOTE — Telephone Encounter (Signed)
 Spoke to [place] at patient support program. Rx sent over to get patient Gemtesa  cost reduced.

## 2023-12-30 ENCOUNTER — Ambulatory Visit: Admitting: Urology

## 2024-01-11 ENCOUNTER — Telehealth: Payer: Self-pay | Admitting: Family Medicine

## 2024-01-11 NOTE — Telephone Encounter (Signed)
 Copied from CRM [id number]. Topic: Referral - Status >> Jan 11, 2024  2:01 PM Tracey Juarez wrote: Reason for CRM: Patient's referral for physical therapy is currently pending as of 12/17/23. Looking to get a status. Please give Jenette Mitchell a call back at [phone].

## 2024-01-12 ENCOUNTER — Telehealth: Payer: Self-pay

## 2024-01-12 NOTE — Telephone Encounter (Signed)
 Referral has been sent to [facility] [place] as Patient requested at this time.

## 2024-01-12 NOTE — Telephone Encounter (Signed)
 Copied from CRM [id number]. Topic: Clinical - Medication Question >> Jan 12, 2024  1:52 PM Essie A wrote: Reason for CRM: Patient would like to have Dr. Steen Eden refill a prescription called clopidogrel  (PLAVIX ) 75 MG tablet.  Please send to this pharmacy: [facility] - [place], [place] - [address] [place] [address] Phone: [phone] Fax: [phone] Hours: Not open 24 hours Let patient know if this is possible by calling [phone].

## 2024-01-12 NOTE — Telephone Encounter (Signed)
 Are you ok to fill this? Looks like another Dr has been filling

## 2024-01-13 ENCOUNTER — Telehealth: Payer: Self-pay

## 2024-01-13 NOTE — Telephone Encounter (Signed)
 It looks like it was prescribed by Dr. Winferd Hatter possibly in the past?  I cannot find a recent note from them, we just need to find out if she is supposed to stay on this long-term or if it is short-term like 3 to 6 months.  I do know if we can find something from the neurology that fits that because I was struggling to find it or if we can call the patient and just ask her how long she been on it and if it is a long-term medicine that I am fine taking it over.

## 2024-01-13 NOTE — Telephone Encounter (Signed)
 Left vm for pt to callback

## 2024-01-13 NOTE — Telephone Encounter (Signed)
 Demographics faxed to [phone]

## 2024-01-13 NOTE — Telephone Encounter (Signed)
 Pt r/c.

## 2024-01-13 NOTE — Telephone Encounter (Signed)
 Made Jenette Mitchell aware that referral has been placed ( 5/8) Number provided for [facility] and per Jenette Mitchell can fax referral to [phone]. Referral faxed.

## 2024-01-13 NOTE — Telephone Encounter (Signed)
 Copied from CRM [id number]. Topic: Medical Record Request - Provider/Facility Request >> Jan 13, 2024  9:12 AM Ivette P wrote: Reason for CRM: Levert Ready called in from [facility] to state they receive referrals from the office with no demographics sheet.  Levert Ready is asking if demographics sheet for pt can be faxed over so she can get the pt scheduled.    callback [phone] fax [phone]

## 2024-01-15 ENCOUNTER — Telehealth: Payer: Self-pay

## 2024-01-15 MED ORDER — CLOPIDOGREL BISULFATE 75 MG PO TABS: 75.0000 mg | ORAL_TABLET | Freq: Every day | ORAL | 1 refills | Status: DC

## 2024-01-15 NOTE — Addendum Note (Signed)
 Addended by: Francine Iron on: 01/15/2024 11:10 AM   Modules accepted: Orders

## 2024-01-15 NOTE — Telephone Encounter (Addendum)
 Pt made aware. Rx sent to [facility] [place]. Current med list is 75mg  daily of Plavix .

## 2024-01-15 NOTE — Telephone Encounter (Signed)
 Called and spoke with patient. She said she has been taking for 10-15 years. She said her specialist has been prescribing it but she missed her yearly appt with him this year because her husband passed away. He advised her last year that she didn't have to keep following up with him unless she just wanted to so she was wondering if you would take over prescribing it?

## 2024-01-15 NOTE — Telephone Encounter (Signed)
 Copied from CRM [id number]. Topic: Clinical - Medication Question >> Jan 12, 2024  1:52 PM Essie A wrote: Reason for CRM: Patient would like to have Dr. Steen Eden refill a prescription called clopidogrel  (PLAVIX ) 75 MG tablet.  Please send to this pharmacy: [facility] - [place], [place] - [address] [place] [address] Phone: [phone] Fax: [phone] Hours: Not open 24 hours Let patient know if this is possible by calling [phone].

## 2024-01-15 NOTE — Telephone Encounter (Signed)
 Copied from CRM [id number]. Topic: Clinical - Medication Question >> Jan 12, 2024  1:52 PM Essie A wrote: Reason for CRM: Patient would like to have Dr. Steen Eden refill a prescription called clopidogrel  (PLAVIX ) 75 MG tablet.  Please send to this pharmacy: [facility] - [place], [place] - [address] [place] [address] Phone: [phone] Fax: [phone] Hours: Not open 24 hours Let patient know if this is possible by calling [phone]. >> Jan 15, 2024 10:24 AM Retta Caster wrote: Patient did not get return call from office as stated on 06/04. Called office They will call her back [phone]

## 2024-01-15 NOTE — Telephone Encounter (Signed)
 Are you willing to refill? Looks like Dr Tat has been prescribing

## 2024-01-15 NOTE — Telephone Encounter (Signed)
 Okay then, yes that is fine, go ahead and refill the clopidogrel  for her.  I just wanted to make sure that it was not a short-term thing and that we kept it going when it was not supposed to be.

## 2024-01-20 ENCOUNTER — Other Ambulatory Visit (INDEPENDENT_AMBULATORY_CARE_PROVIDER_SITE_OTHER): Admitting: Pharmacist

## 2024-01-20 ENCOUNTER — Telehealth: Payer: Self-pay

## 2024-01-20 DIAGNOSIS — M810 Age-related osteoporosis without current pathological fracture: Secondary | ICD-10-CM

## 2024-01-20 MED ORDER — DENOSUMAB 60 MG/ML ~~LOC~~ SOSY
60.0000 mg | PREFILLED_SYRINGE | Freq: Once | SUBCUTANEOUS | Status: AC
  Administered 2024-02-09: 60 mg via SUBCUTANEOUS

## 2024-01-20 NOTE — Progress Notes (Signed)
 01/20/2024 Name: Tracey Juarez MRN: [id number] DOB: 11/27/1940  Chief Complaint  Patient presents with   Osteoporosis    Tracey Juarez is a 83 y.o. year old female who presented for a telephone visit. I connected with  Noella Baton on 01/20/24 by telephone and verified that I am speaking with the correct person using two identifiers. I discussed the limitations of evaluation and management by telemedicine. The patient expressed understanding and agreed to proceed.  Patient was located in her home and PharmD in office during this visit.   They were referred to the pharmacist by their PCP for assistance in managing osteoporosis .   Care Team: Primary Care Provider: Dettinger, Lucio Sabin, MD ; Next Scheduled Visit: 06/20/24  Medication Access/Adherence  Current Pharmacy:  [facility] [address] [place] - [address] [place] [address] Phone: [phone] Fax: [phone]  [facility] [phone] - [place], [place] - [address] [place] [address] Phone: [phone] Fax: [phone]  [facility] [address] [place] - [address] [place] [address] Phone: [phone] Fax: [phone]   Patient reports affordability concerns with their medications: No  Patient reports access/transportation concerns to their pharmacy: No  Patient reports adherence concerns with their medications:  No    Subjective: Patient reports last year she fell and broke her hip when she was on the bottom stair in her house. She says she has fallen several times since then, but not broken any bones. Ambulates with a walker at home. She does not remember taking risedronate , but also notes she has taken so many medications she does not recall all of them.   Osteoporosis:  Current medications: none Medications tried in the past: risedronate  35 mg weekly   Current supplements: none currently, drinks boost which  has some vitamin D   Current physical activity: mobility is limited  Most recent DEXA: 12/17/23 FINDINGS: Scan quality: Good.   RIGHT FEMORAL NECK:   BMD (in g/cm2): 0.750   T-score: -2.1   Z-score: 0.4   RIGHT TOTAL HIP:   BMD (in g/cm2): 0.619   T-score: -3.1   Z-score: -0.7   Rate of change from previous exam: -5.9 %   LEFT FOREARM (RADIUS 33%):   BMD (in g/cm2): 0.460   T-score: -4.8   Z-score: -2.0   FRAX 10-YEAR PROBABILITY OF FRACTURE:   FRAX not reported as the lowest BMD is not in the osteopenia range.   IMPRESSION: Osteoporosis based on BMD.  Current medication access support: [facility] Medicare   Objective:  Lab Results  Component Value Date   CREATININE 0.78 12/17/2023   BUN 21 12/17/2023   NA 141 12/17/2023   K 4.4 12/17/2023   CL 103 12/17/2023   CO2 26 12/17/2023    Lab Results  Component Value Date   CHOL 171 12/17/2023   HDL 78 12/17/2023   LDLCALC 82 12/17/2023   TRIG 54 12/17/2023   CHOLHDL 2.2 12/17/2023    Medications Reviewed Today     Reviewed by Philmore Bream, RPH (Pharmacist) on 01/20/24 at 1020  Med List Status: <None>   Medication Order Taking? Sig Documenting Provider Last Dose Status Informant  acetaminophen  (TYLENOL ) 325 MG tablet [id number] No Take 650 mg by mouth every 6 (six) hours as needed. [provider] Taking Active   Carbidopa -Levodopa  ER (SINEMET  CR) 25-100 MG tablet controlled release [id number] No Take 1 tablet by mouth 3 (  three) times daily. [provider] Taking Active   cetirizine (ZYRTEC) 10 MG tablet [id number] No Take 10 mg by mouth daily. [provider] Taking Active   clopidogrel  (PLAVIX ) 75 MG tablet [id number]  Take 1 tablet (75 mg total) by mouth daily. Restart on 08/01/23 Dettinger, Lucio Sabin, MD  Active   estradiol  (ESTRACE ) 0.1 MG/GM vaginal cream [id number] No Discard plastic applicator. Insert a blueberry size amount (approximately 1 gram) of cream on fingertip inside  vagina at bedtime every night for 1 week then every other night. For long term use. Lauretta Ponto, FNP Taking Active   famotidine  (PEPCID ) 10 MG tablet [id number] No Take 10 mg by mouth daily. [provider] Taking Active   feeding supplement (BOOST HIGH PROTEIN) LIQD [id number] No Take 1 Container by mouth 3 (three) times daily between meals. [provider] Taking Active   fluticasone  (FLONASE ) 50 MCG/ACT nasal spray [id number] No USE 1 SPRAY(S) IN EACH NOSTRIL TWICE DAILY AS NEEDED FOR ALLERGIES OR RHINITIS Dettinger, Lucio Sabin, MD Taking Active   levETIRAcetam  (KEPPRA ) 500 MG tablet [id number] No Take 1 tablet (500 mg total) by mouth 2 (two) times daily. Demaris Fillers, MD Taking Active   lisinopril  (ZESTRIL ) 5 MG tablet [id number]  Take 1 tablet (5 mg total) by mouth daily. Dettinger, Lucio Sabin, MD  Active   meclizine  (ANTIVERT ) 12.5 MG tablet [id number] No TAKE 1 TO 2 TABLETS BY MOUTH THREE TIMES DAILY AS NEEDED FOR DIZZINESS Dettinger, Lucio Sabin, MD Taking Active   rosuvastatin  (CRESTOR ) 10 MG tablet [id number]  Take 1 tablet (10 mg total) by mouth daily. Dettinger, Lucio Sabin, MD  Active   Vibegron  (GEMTESA ) 75 MG TABS [id number]  Take 1 tablet (75 mg total) by mouth daily. Lauretta Ponto, FNP  Active               Assessment/Plan:   Osteoporosis: - Currently inappropriately managed/opportunity for optimization. Patient with right hip T score -3.1 and left forearm T score -4.8 and she has a history of pathological hip fracture in 2024. History of breast cancer and treatment with anastrozole  for ~7 years which decreases BMD and increases fracture risk. She is currently high risk for falls with Parkinson's and use of walker. Appears she was on risedronate  from ~2018-2023, but she stopped this. Given she is high risk for fracture and has progressed to osteoporosis, would recommend to start Prolia. Discussed cost which is likely ~$300 with each injection on Medicare and she was  amenable to this.  - Reviewed recommendation for daily calcium  intake of 1200 mg and vitamin D  intake of [phone] units - Recommended to choose calcium  citrate formulation due to concurrent acid reflux medication - Reviewed benefits of weight bearing exercise - Recommend to start Prolia    Follow Up Plan: Follow up pending benefits investigation  Georga Killings, PharmD PGY-1 Pharmacy Resident

## 2024-01-20 NOTE — Telephone Encounter (Signed)
 Prolia VOB initiated via [email or web address]  Next Prolia inj DUE: NEW START

## 2024-01-22 ENCOUNTER — Other Ambulatory Visit (HOSPITAL_COMMUNITY): Payer: Self-pay

## 2024-01-22 NOTE — Telephone Encounter (Signed)
 Pt ready for scheduling for PROLIA  on or after : 01/22/24  Option# 1: Buy/Bill (Office supplied medication)  Out-of-pocket cost due at time of clinic visit: $---  Number of injection/visits approved: ---  Primary: [employer] MEDICARE Prolia  co-insurance: 20% Admin fee co-insurance: 0%  Secondary: --- Prolia  co-insurance:  Admin fee co-insurance:   Medical Benefit Details: Date Benefits were checked: 01/22/24 Deductible: NO/ Coinsurance: 20%/ Admin Fee: 0%  Prior Auth: DOES NOT MEET CRITERIA PA# Expiration Date:   # of doses approved: ----------------------------------------------------------------------- Option# 2- Med Obtained from pharmacy:  Pharmacy benefit: Copay $300 (Paid to pharmacy) Admin Fee: 0% (Pay at clinic)  Prior Auth: N/A PA# Expiration Date:   # of doses approved:   If patient wants fill through the pharmacy benefit please send prescription to: [email or web address], and include estimated need by date in rx notes. Pharmacy will ship medication directly to the office.  Patient NOT eligible for Prolia  Copay Card. Copay Card can make patient's cost as little as $25. Link to apply: [email or web address]://www.amgensupportplus.com/copay  ** This summary of benefits is an estimation of the patient's out-of-pocket cost. Exact cost may very based on individual plan coverage.

## 2024-01-22 NOTE — Telephone Encounter (Signed)
 Prior Authorization form/request asks a question that requires your assistance. Please see the question below and advise accordingly. The PA will not be submitted until the necessary information is received.       Insurance requires failure of contraindication to BOTH an oral and IV bisphosphate. Patient only has documented failure to an oral bisphosphate. Patient does not meet criteria for medical PA.   Patient can get Prolia  through pharmacy benefit - no PA required. Patient's copay is $300.

## 2024-01-22 NOTE — Telephone Encounter (Signed)
 Tracey Juarez

## 2024-01-26 ENCOUNTER — Telehealth: Payer: Self-pay | Admitting: Family Medicine

## 2024-01-26 ENCOUNTER — Telehealth: Payer: Self-pay

## 2024-01-26 NOTE — Telephone Encounter (Signed)
 Copied from CRM [id number]. Topic: Referral - Question >> Jan 26, 2024 11:50 AM Donald Frost wrote: Reason for CRM: The patient called in to make sure the referral and all her information was sent to [facility] in [facility] and after looking I told her yes. She is going to call Carolene Chute and check on things because she says someone told her to come in to the office there yesterday and she showed up and was told she didn't have an appt after all. She is confused as to what is going on. >> Jan 26, 2024  3:06 PM Jearlean Mince B wrote: Referral was sent to [facility] on 6/3. She will need to contact their Office for any Appt information.

## 2024-01-26 NOTE — Telephone Encounter (Signed)
 Copied from CRM [id number]. Topic: Referral - Question >> Jan 26, 2024 11:50 AM Donald Frost wrote: Reason for CRM: The patient called in to make sure the referral and all her information was sent to [facility] in [facility] and after looking I told her yes. She is going to call Carolene Chute and check on things because she says someone told her to come in to the office there yesterday and she showed up and was told she didn't have an appt after all. She is confused as to what is going on.

## 2024-01-26 NOTE — Telephone Encounter (Unsigned)
 Copied from CRM [id number]. Topic: Referral - Question >> Jan 26, 2024 11:50 AM Donald Frost wrote: Reason for CRM: The patient called in to make sure the referral and all her information was sent to [facility] in [facility] and after looking I told her yes. She is going to call Carolene Chute and check on things because she says someone told her to come in to the office there yesterday and she showed up and was told she didn't have an appt after all. She is confused as to what is going on. >> Jan 26, 2024  3:06 PM Jearlean Mince B wrote: Referral was sent to [facility] on 6/3. She will need to contact their Office for any Appt information.

## 2024-01-27 DIAGNOSIS — N3941 Urge incontinence: Secondary | ICD-10-CM | POA: Insufficient documentation

## 2024-01-27 DIAGNOSIS — N390 Urinary tract infection, site not specified: Secondary | ICD-10-CM | POA: Insufficient documentation

## 2024-01-27 DIAGNOSIS — N3281 Overactive bladder: Secondary | ICD-10-CM | POA: Insufficient documentation

## 2024-01-27 NOTE — Progress Notes (Unsigned)
 Name: Tracey Juarez DOB: 28-Nov-1940 MRN: [id number]  History of Present Illness: [name] is Juarez 83 y.o. female who presents today for follow up visit at [facility] [place].  Relevant History includes: 1. OAB with urge incontinence. - Neurogenic risks: Parkinson disease, prior stroke, age. - Previously failed Myrbetriq . 2. Recurrent UTl. 3. Vaginal atrophy. 4. Bilateral renal cysts. Per CT on 07/21/2023.   Urine culture results in past 12 months: - 07/21/2023: Positive for E. coli - 08/20/2023: Negative - 09/10/2023: Positive for Proteus mirabilis. Treated with Keflex . - 10/12/2023: Negative  At 1st visit on 10/12/2023: The plan was: 1. Discontinue Ditropan  (Oxybutynin ) XL.  2. Start Gemtesa  75 mg daily.  3. Start topical vaginal estrogen cream as prescribed. 4. Maintain adequate fluid intake daily to flush out the urinary tract. 5. Urinate every 4-6 hours while awake to minimize urinary stasis / bacterial overgrowth in the bladder. 6. Consider OTC supplements for UTI prevention. 7. Return in about 6 weeks (around 11/23/2023) for UA, PVR, & f/u with Tracey Lederer NP.  Today: She reports minimal improvement with Gemtesa  - now voiding only 1x/night on average but continues to have significantly bothersome urinary frequency, urgency, and urge incontinence. Denies straining to void, or sensations of incomplete emptying. She reports intermittent dysuria, which she thinks may be related to friction from her Depends.  She states she lost the topical vaginal estrogen cream and never really used it routinely.   Medications: Current Outpatient Medications  Medication Sig Dispense Refill   acetaminophen  (TYLENOL ) 325 MG tablet Take 650 mg by mouth every 6 (six) hours as needed.     Carbidopa -Levodopa  ER (SINEMET  CR) 25-100 MG tablet controlled release Take 1 tablet by mouth 3 (three) times daily.     cetirizine (ZYRTEC) 10 MG tablet Take 10 mg by mouth daily.      clopidogrel  (PLAVIX ) 75 MG tablet Take 1 tablet (75 mg total) by mouth daily. Restart on 08/01/23 90 tablet 1   estradiol  (ESTRACE ) 0.1 MG/GM vaginal cream Discard plastic applicator. Insert Juarez blueberry size amount (approximately 1 gram) of cream on fingertip inside vagina at bedtime every night for 1 week then every other night. For long term use. 30 g 3   famotidine  (PEPCID ) 10 MG tablet Take 10 mg by mouth daily.     feeding supplement (BOOST HIGH PROTEIN) LIQD Take 1 Container by mouth 3 (three) times daily between meals.     fluticasone  (FLONASE ) 50 MCG/ACT nasal spray USE 1 SPRAY(S) IN EACH NOSTRIL TWICE DAILY AS NEEDED FOR ALLERGIES OR RHINITIS 48 g 0   levETIRAcetam  (KEPPRA ) 500 MG tablet Take 1 tablet (500 mg total) by mouth 2 (two) times daily. 14 tablet 0   lisinopril  (ZESTRIL ) 5 MG tablet Take 1 tablet (5 mg total) by mouth daily. 90 tablet 3   meclizine  (ANTIVERT ) 12.5 MG tablet TAKE 1 TO 2 TABLETS BY MOUTH THREE TIMES DAILY AS NEEDED FOR DIZZINESS 60 tablet 5   rosuvastatin  (CRESTOR ) 10 MG tablet Take 1 tablet (10 mg total) by mouth daily. 90 tablet 0   Vibegron  (GEMTESA ) 75 MG TABS Take 1 tablet (75 mg total) by mouth daily. 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   Detrusor instability    Elevated cholesterol    GERD (gastroesophageal reflux disease)    Hot flashes    Humerus head fracture, left, with routine healing, subsequent encounter 2000   Hypertension    Melanoma (HCC)    Mixed basal-squamous cell carcinoma     skin   Parkinson disease (HCC)    Personal history of radiation therapy    Stroke (HCC)    on Plavix , no deficits   Past Surgical History:  Procedure Laterality Date   BRAIN SURGERY     BREAST LUMPECTOMY Left 04/2015   radiation   BREAST LUMPECTOMY WITH RADIOACTIVE SEED AND SENTINEL LYMPH NODE BIOPSY Left 04/25/2015   Procedure: LEFT BREAST PARTIAL MASTECTOMY WITH RADIOACTIVE SEED AND SENTINEL LYMPH NODE MAPPING;  Surgeon: Tracey Dryer, MD;  Location:  SURGERY CENTER;  Service: General;  Laterality: Left;   FOOT SURGERY  2004   KNEE SURGERY  2007   skin cancer excised     TONSILLECTOMY     Family History  Problem Relation Age of Onset   Hypertension Mother    Heart attack Mother    Heart attack Maternal Aunt    Heart attack Maternal Uncle    Breast cancer Neg Hx    Social History   Socioeconomic History   Marital status: Married    Spouse name: Tracey Juarez    Number of children: 0   Years of education: ged   Highest education level: GED or equivalent  Occupational History   Occupation: Retired  Tobacco Use   Smoking status: Never   Smokeless tobacco: Never  Vaping Use   Vaping status: Never Used  Substance and Sexual Activity   Alcohol use: No    Alcohol/week: 0.0 standard drinks of alcohol   Drug use: No   Sexual activity: Never    Birth control/protection: Post-menopausal    Comment: 1st intercourse 16 yo-2 partners  Other Topics Concern   Not on file  Social History Narrative   Not on file   Social Drivers of Health   Financial Resource Strain: Low Risk  (12/16/2023)   Overall Financial Resource Strain (CARDIA)    Difficulty of Paying Living Expenses: Not hard at all  Food Insecurity: No Food Insecurity (12/16/2023)   Hunger Vital Sign    Worried About Running Out of Food in the Last Year: Never true    Ran Out of Food in the Last Year: Never true  Transportation Needs: No Transportation Needs (12/16/2023)   PRAPARE - Scientist, research (physical sciences) (Medical): No    Lack of Transportation (Non-Medical): No  Physical Activity: Insufficiently Active (12/16/2023)   Exercise Vital Sign    Days of Exercise per Week: 3 days    Minutes of Exercise per Session: 20 min  Stress: No Stress Concern Present (12/16/2023)   [employer] of Occupational Health - Occupational Stress Questionnaire    Feeling of Stress : Only Juarez little  Social Connections: Moderately Isolated (12/16/2023)   Social Connection and Isolation Panel    Frequency of Communication with Friends and Family: More than three times Juarez week    Frequency of Social Gatherings with Friends and Family: More than three times Juarez week    Attends Religious Services: More than  4 times per year    Active Member of Clubs or Organizations: No    Attends Banker Meetings: Never    Marital Status: Widowed  Intimate Partner Violence: Not At Risk (12/16/2023)   Humiliation, Afraid, Rape, and Kick questionnaire    Fear of Current or Ex-Partner: No    Emotionally Abused: No    Physically Abused: No    Sexually Abused: No    Review of Systems Constitutional: Patient denies any unintentional weight loss or change in strength lntegumentary: Patient denies any rashes or pruritus Cardiovascular: Patient denies chest pain or syncope Respiratory: Patient denies shortness of breath Gastrointestinal: Patient denies constipation Musculoskeletal: Patient denies muscle cramps or weakness Neurologic: Patient denies convulsions or seizures Allergic/Immunologic: Patient denies recent allergic reaction(s) Hematologic/Lymphatic: Patient denies bleeding tendencies Endocrine: Patient denies heat/cold intolerance  GU: As per HPI.  OBJECTIVE Vitals:   01/28/24 1331  BP: (!) 163/71  Pulse: 71   There is no height or weight on file to calculate BMI.  Physical Examination Constitutional: No obvious distress; patient is non-toxic appearing  Cardiovascular: No visible lower  extremity edema.  Respiratory: The patient does not have audible wheezing/stridor; respirations do not appear labored  Gastrointestinal: Abdomen non-distended Musculoskeletal: Normal ROM of UEs  Skin: No obvious rashes/open sores  Neurologic: CN 2-12 grossly intact Psychiatric: Answered questions appropriately with normal affect  Hematologic/Lymphatic/Immunologic: No obvious bruises or sites of spontaneous bleeding  Urine microscopy: 0-5 WBC/hpf, 3-10 RBC/hpf, few bacteria PVR: 58 ml  ASSESSMENT Recurrent UTI - Plan: Urinalysis, Routine w reflex microscopic, BLADDER SCAN AMB NON-IMAGING  OAB (overactive bladder) - Plan: Urinalysis, Routine w reflex microscopic, BLADDER SCAN AMB NON-IMAGING  Urge incontinence - Plan: Urinalysis, Routine w reflex microscopic, BLADDER SCAN AMB NON-IMAGING  Parkinson's disease without dyskinesia, with fluctuating manifestations (HCC) - Plan: Urinalysis, Routine w reflex microscopic, BLADDER SCAN AMB NON-IMAGING  Atrophic vaginitis - Plan: Urinalysis, Routine w reflex microscopic, BLADDER SCAN AMB NON-IMAGING  Ambulatory dysfunction - Plan: Urinalysis, Routine w reflex microscopic, BLADDER SCAN AMB NON-IMAGING  We discussed the symptoms of overactive bladder (OAB), which include urinary urgency, frequency, nocturia, with or without urge incontinence.  While we may not know the exact etiology of OAB, several risk factors can be identified.  - Neurogenic risk factors: prior stroke, Parkinson's disease - Exacerbating factors: ambulatory dysfunction (functional incontinence)  We discussed the following management options in detail including potential benefits, risks, and side effects: Behavioral therapy: Minimize / avoid bladder irritants (such as caffeine) Bladder retraining / timed voiding Medication(s): - Not Juarez safe candidate for anticholinergic medications due to risk for side effects based on patient's age, comorbidities, and Parkinsons disease  -  Failed monotherapy with Gemtesa    Advised PTNS (posterior tibial nerve stimulation) on right side (has left hip metal implant per patient). Discussed procedure and potential risks/benefits. Pt verbalized understanding and agreement. All questions were answered.  PLAN Advised the following: 1. Continue Gemtesa  (Vibegron ) 75 mg daily. 2. Start topical vaginal estrogen cream at least 2 nights per week. 3. Minimize / avoid caffeine intake. 4. Work on timed voiding / bladder retraining. 5. Return for weekly nurse visits x12 for PTNS then f/u with NP in 3 months.  Orders Placed This Encounter  Procedures   Urinalysis, Routine w reflex microscopic   BLADDER SCAN AMB NON-IMAGING   Total time spent caring for the patient today was over 30 minutes. This includes time spent on the date of the visit reviewing the patient's chart before the visit,  time spent during the visit, and time spent after the visit on documentation. Over 50% of that time was spent in face-to-face time with this patient for direct counseling. E&M based on time and complexity of medical decision making.  It has been explained that the patient is to follow regularly with their PCP in addition to all other providers involved in their care and to follow instructions provided by these respective offices. Patient advised to contact urology clinic if any urologic-pertaining questions, concerns, new symptoms or problems arise in the interim period.  Patient Instructions  Overactive bladder (OAB) overview for patients:  Symptoms may include: urinary urgency (gotta go feeling) urinary frequency (voiding >8 times per day) night time urination (nocturia) urge incontinence of urine (UUI)  While we do not know the exact etiology of OAB, several treatment options exist including:  Behavioral therapy: Reducing fluid intake Decreasing bladder stimulants (such as caffeine) and irritants (such as acidic food, spicy foods, alcohol) Urge  suppression strategies Bladder retraining via timed voiding  Pelvic floor physical therapy  Medication(s) - can use one or both of the drug classes below. Anticholinergic / antimuscarinic medications:  Mechanism of action: Activate M3 receptors to reduce detrusor stimulation and increase bladder capacity   (parasympathetic nervous system). Effect: Relaxes the bladder to decrease overactivity, increase bladder storage capacity, and increase time between voids. Onset: Slow acting (may take 8-12 weeks to determine efficacy). Medications include: Vesicare (Solifenacin), Ditropan  (Oxybutynin ), Detrol (Tolterodine), Toviaz (Fesoterodine), Sanctura (Trospium), Urispas (Flavoxate), Enablex (Darifenacin), Bentyl (Dicyclomine), Levsin (Hyoscyamine ). Potential side effects include but are not limited to: Dry eyes, dry mouth, constipation, cognitive impairment, dementia risk with long term use, and urinary retention/ incomplete bladder emptying. Insurance companies generally prefer for patients to try 1-2 anticholinergic / antimuscarinic medications first due to low cost. Some exceptions are made based on patient-specific comorbidities / risk factors. Beta-3 agonist medications: Mechanism of action: Stimulates selective B3 adrenergic receptors to cause smooth muscle bladder relaxation (sympathetic nervous system). Effect: Relaxes the bladder to decrease overactivity, increase bladder storage capacity, and increase time between voids. Onset: Slow acting (may take 8-12 weeks to determine efficacy). Medications include: Myrbetriq  (Mirabegron ) and Vibegron  (Gemtesa ). Potential side effects include but are not limited to: urinary retention / incomplete bladder emptying and elevated blood pressure (more likely to occur in individuals with pre-existing uncontrolled hypertension). These medications tend to be more expensive than the anticholinergic / antimuscarinic medications.   For patients with refractory  OAB (if the above treatment options have been unsuccessful): Posterior tibial nerve stimulation (PTNS). Small acupuncture-type needle inserted near ankle with electric current to stimulate bladder via posterior tibial nerve pathway. Initially requires 12 weekly in-office treatments lasting 30 minutes each; followed by monthly in-office treatments lasting 30 minutes each for 1 year.  Bladder Botox injections. How it is done: Typically done via in-office cystoscopy; sometimes done in the OR depending on the situation. The bladder is numbed with lidocaine  instilled via Juarez catheter. Then the urologist injects Botox into the bladder muscle wall in about 20 locations. Causes local paralysis of the bladder muscle at the injection sites to reduce bladder muscle overactivity / spasms. The effect lasts for approximately 6 months and cannot be reversed once performed. Risks may included but are not limited to: infection, incomplete bladder emptying/ urinary retention, short term need for self-catheterization or indwelling catheter, and need for repeat therapy. There is Juarez 5-12% chance of needing to catheterize with Botox - that usually resolves in Juarez few months as the  Botox wears off. Typically Botox injections would need to be repeated every 3-12 months since this is not Juarez permanent therapy.  Sacral neuromodulation trial (Medtronic lnterStim or Axonics implant). Sacral neuromodulation is FDA-approved for uncontrolled urinary urgency, urinary frequency, urinary urge incontinence, non-obstructive urinary retention, or fecal incontinence. It is not FDA-approved as Juarez treatment for pain. The goal of this therapy is at least Juarez 50% improvement in symptoms. It is NOT realistic to expect Juarez 100% cure. This is Juarez Juarez 2-step outpatient procedure. After Juarez successful test period, Juarez permanent wire and generator are placed in the OR. We discussed the risk of infection. We reviewed the fact that about 30% of patients fail the test phase  and are not candidates for permanent generator placement. During the 1-2 week trial phase, symptoms are documented by the patient to determine response. If patient gets at least Juarez 50% improvement in symptoms, they may then proceed with Step 2. Step 1: Trial lead placement. Per physician discretion, may done one of two ways: Percutaneous nerve evaluation (PNE) in the [facility] urology office. Performed by urologist under local anesthesia (numbing the area with lidocaine ) using Juarez spinal needle for placement of test wire, which usually stays in place for 5-7 days to determine therapy response. Test lead placement in OR under anesthesia. Usually stays in place 2 weeks to determine therapy response. > Step 2: Permanent implantation of sacral neuromodulation device, which is performed in the OR.  Sacral neuromodulation implants: All are conditionally MRI safe. Manufacturer: Medtronic Website: [employer] therapy/right-for-you.html Options: lnterStim X: Non-rechargeable. The battery lasts 10 years on average. lnterStim Micro: Rechargeable. The battery lasts 15 years on average and must be charged routinely. Approximately 50% smaller implant than lnterStim X implant.  Manufacturer: Axonics Website: Findrealrelief.axonics.com Options: Non-rechargeable (Axonics F15): The battery lasts 15 years on average. Rechargeable (Axonics R20): The battery lasts 20 years on average and must be charged in office for about 1 hour every 6-10 months on average. Approximately 50% smaller implant than Axonics non-rechargeable implant.  Note: Generally the rechargeable devices are only advised for very small or thin patients who may not have sufficient adipose tissue to comfortably overlay the implanted device.  Suprapubic catheter (SP tube) placement. Only done in severely refractory OAB when all other options have failed or are not Juarez viable  treatment choice depending on patient factors. Involves placement of Juarez catheter through the lower abdomen into the bladder to continuously drain the bladder into an external collection bag, which patient can then empty at their convenience every few hours. Done via an outpatient surgical procedure in the OR under anesthesia. Risks may included but are not limited to: surgical site pain, infections, skin irritation / breakdown, chronic bacteriuria, symptomatic UTls. The SP tube must stay in place continuously. This is Juarez reversible procedure however - the insertion site will close if catheter is removed for more than Juarez few hours. The SP tube must be exchanged routinely every 4 weeks to prevent the catheter from becoming clogged with sediment. SP tube exchanges are typically performed at Juarez urology nurse visit or by Juarez home health nurse.   Electronically signed by:  Lauretta Ponto, FNP   01/28/24    2:04 PM

## 2024-01-27 NOTE — Telephone Encounter (Signed)
 Per pt shet has an appt with [facility] next week. She has no further concerns.

## 2024-01-28 ENCOUNTER — Encounter: Payer: Self-pay | Admitting: Urology

## 2024-01-28 ENCOUNTER — Ambulatory Visit: Admitting: Urology

## 2024-01-28 ENCOUNTER — Telehealth: Payer: Self-pay | Admitting: Family Medicine

## 2024-01-28 VITALS — BP 163/71 | HR 71

## 2024-01-28 DIAGNOSIS — N952 Postmenopausal atrophic vaginitis: Secondary | ICD-10-CM | POA: Diagnosis not present

## 2024-01-28 DIAGNOSIS — N3941 Urge incontinence: Secondary | ICD-10-CM

## 2024-01-28 DIAGNOSIS — N3281 Overactive bladder: Secondary | ICD-10-CM

## 2024-01-28 DIAGNOSIS — Z87440 Personal history of urinary (tract) infections: Secondary | ICD-10-CM

## 2024-01-28 DIAGNOSIS — N390 Urinary tract infection, site not specified: Secondary | ICD-10-CM

## 2024-01-28 DIAGNOSIS — R262 Difficulty in walking, not elsewhere classified: Secondary | ICD-10-CM

## 2024-01-28 DIAGNOSIS — G20A2 Parkinson's disease without dyskinesia, with fluctuations: Secondary | ICD-10-CM

## 2024-01-28 LAB — URINALYSIS, ROUTINE W REFLEX MICROSCOPIC
Bilirubin, UA: NEGATIVE
Glucose, UA: NEGATIVE
Ketones, UA: NEGATIVE
Leukocytes,UA: NEGATIVE
Nitrite, UA: NEGATIVE
Specific Gravity, UA: 1.02 (ref 1.005–1.030)
Urobilinogen, Ur: 0.2 mg/dL (ref 0.2–1.0)
pH, UA: 7 (ref 5.0–7.5)

## 2024-01-28 LAB — MICROSCOPIC EXAMINATION

## 2024-01-28 LAB — BLADDER SCAN AMB NON-IMAGING: Scan Result: 58

## 2024-01-28 NOTE — Telephone Encounter (Signed)
 DONE  Copied from CRM [id number]. Topic: Medical Record Request - Provider/Facility Request >> Jan 13, 2024  9:12 AM Ivette P wrote: Reason for CRM: Levert Ready called in from [facility] to state they receive referrals from the office with no demographics sheet.  Levert Ready is asking if demographics sheet for pt can be faxed over so she can get the pt scheduled.    callback [phone] fax [phone] >> Jan 28, 2024  3:11 PM Tiffany H wrote: Angie with [facility] in [place] called to request insurance information for patient. Provided necessary details to process first appointment but also please send insurance sheet to [facility].   Referral sent to: [facility] [place],  [address] [phone] fax [phone]

## 2024-01-28 NOTE — Patient Instructions (Signed)

## 2024-01-29 ENCOUNTER — Other Ambulatory Visit: Payer: Self-pay

## 2024-01-29 DIAGNOSIS — M810 Age-related osteoporosis without current pathological fracture: Secondary | ICD-10-CM

## 2024-01-29 MED ORDER — DENOSUMAB 60 MG/ML ~~LOC~~ SOSY: 60.0000 mg | PREFILLED_SYRINGE | Freq: Once | SUBCUTANEOUS | 0 refills | Status: AC

## 2024-02-01 ENCOUNTER — Telehealth: Payer: Self-pay

## 2024-02-01 NOTE — Telephone Encounter (Signed)
 Copied from CRM [id number]. Topic: Appointments - Scheduling Inquiry for Clinic >> Feb 01, 2024  1:14 PM Delon DASEN wrote: Reason for CRM: Patient needs appt for Prolia  injection- please call [phone]   Called and spoke to the patient - informed her that RX was sent to pharmacy and we are waiting for the medication to arrive and will call her to schedule appt at that time

## 2024-02-08 ENCOUNTER — Telehealth: Payer: Self-pay

## 2024-02-08 ENCOUNTER — Other Ambulatory Visit: Payer: Self-pay | Admitting: Urology

## 2024-02-08 DIAGNOSIS — G20A2 Parkinson's disease without dyskinesia, with fluctuations: Secondary | ICD-10-CM

## 2024-02-08 DIAGNOSIS — R262 Difficulty in walking, not elsewhere classified: Secondary | ICD-10-CM

## 2024-02-08 DIAGNOSIS — N390 Urinary tract infection, site not specified: Secondary | ICD-10-CM

## 2024-02-08 DIAGNOSIS — R3915 Urgency of urination: Secondary | ICD-10-CM

## 2024-02-08 DIAGNOSIS — N3281 Overactive bladder: Secondary | ICD-10-CM

## 2024-02-08 DIAGNOSIS — N3941 Urge incontinence: Secondary | ICD-10-CM

## 2024-02-08 NOTE — Telephone Encounter (Signed)
 Spoke with representative from [employer] about patient Tracey Juarez , representative asked our office hours, advised office is open Monday-Friday 8am-5pm. Representative stated that patient medication would be delivered tomorrow February 09, 2024 by 12pm. I called the patient back to let them know that medication would be delivered and scheduled appointment for February 09, 2024 for patient to come in and get injection. Advised patient and Tracey Juarez that if medication is not delivered by 1pm on February 09, 2024 I would reach out to reschedule the appointment. Tracey Juarez and patient agreed and verbalized understanding.

## 2024-02-08 NOTE — Telephone Encounter (Signed)
 Copied from CRM [id number]. Topic: Appointments - Appointment Scheduling >> Feb 08, 2024  3:05 PM Travis FALCON wrote: Sari is calling in wanting to know when patient can come in for her Prolia  shot. She has previously spoken with office and hasn't heard anything back since then.

## 2024-02-08 NOTE — Telephone Encounter (Signed)
 Returned [employer] call and advised that we have not recieved patient's prolia  in office yet but once it is delivered we will reach out to schedule appointment.

## 2024-02-09 ENCOUNTER — Ambulatory Visit

## 2024-02-09 DIAGNOSIS — M810 Age-related osteoporosis without current pathological fracture: Secondary | ICD-10-CM | POA: Diagnosis not present

## 2024-02-09 NOTE — Progress Notes (Signed)
 Patient is in office today for a nurse visit for  Prolia injection.   . Patient Injection was given in the  Right arm. Patient tolerated injection well.

## 2024-02-25 ENCOUNTER — Telehealth: Payer: Self-pay

## 2024-02-25 ENCOUNTER — Ambulatory Visit (INDEPENDENT_AMBULATORY_CARE_PROVIDER_SITE_OTHER)

## 2024-02-25 DIAGNOSIS — R399 Unspecified symptoms and signs involving the genitourinary system: Secondary | ICD-10-CM

## 2024-02-25 DIAGNOSIS — N3281 Overactive bladder: Secondary | ICD-10-CM

## 2024-02-25 LAB — URINALYSIS, ROUTINE W REFLEX MICROSCOPIC
Bilirubin, UA: NEGATIVE
Glucose, UA: NEGATIVE
Leukocytes,UA: NEGATIVE
Nitrite, UA: NEGATIVE
Specific Gravity, UA: 1.02 (ref 1.005–1.030)
Urobilinogen, Ur: 1 mg/dL (ref 0.2–1.0)
pH, UA: 6.5 (ref 5.0–7.5)

## 2024-02-25 LAB — MICROSCOPIC EXAMINATION: Bacteria, UA: NONE SEEN

## 2024-02-25 NOTE — Patient Instructions (Signed)

## 2024-02-25 NOTE — Telephone Encounter (Signed)
 Called pt to give ua results per NP [place] No evidence of UTI; no indication for antibiotic treatment.  Pt voiced her understanding

## 2024-02-25 NOTE — Progress Notes (Addendum)
 PTNS  Session # 1 of 12  PTNS was prescribed for the patient's Overactive Bladder symptoms. The voiding diary and/or patients symptoms were reviewed prior to the start of treatment.   Patient Goals: reduced urgency and frequency  Health & Social Factors: no change Caffeine: coffee 1 /day Alcohol: no Daytime voids #per day: 8 Night-time voids #per night: 1 Urgency: strong Incontinence Episodes #per day: 6 Treatment Plan/Comments: 6 Ankle used: right Treatment Setting: 6 Feeling/ Response: tingling sensation  PTNS Treatment The needle electrode was inserted into the lower, inner aspect of patient's leg. The surface electrode was placed on the inside arch of the foot on the treatment leg.The lead set was connected to the stimulator, and the needle electrode clip was connected to the needle electrode. The stimulator that produces an adjustable electrical pulse that travels to the sacral nerve plexus via the tibial nerve was increased until a patient response was observed.    Performed By: Exie LANES  Follow Up: as scheduled    Patient requested a ua due to painful urination per verbal from NP run ua

## 2024-03-03 ENCOUNTER — Ambulatory Visit

## 2024-03-03 DIAGNOSIS — N3281 Overactive bladder: Secondary | ICD-10-CM

## 2024-03-03 NOTE — Progress Notes (Signed)
 PTNS  Session # 2 of 12  PTNS was prescribed for the patient's Overactive Bladder symptoms. The voiding diary and/or patients symptoms were reviewed prior to the start of treatment.   Patient Goals: reduce urgency, frequency, no accidents, and void q 2 hrs.  Health & Social Factors:  Caffeine: coffee 1 /day Alcohol: no Daytime voids #per day: 8 Night-time voids #per night: 1 Urgency: stong Incontinence Episodes #per day: 6 Treatment Plan/Comments: 9  Ankle used: left Treatment Setting: 9 Feeling/ Response: positive sensation  PTNS Treatment The needle electrode was inserted into the lower, inner aspect of patient's leg. The surface electrode was placed on the inside arch of the foot on the treatment leg.The lead set was connected to the stimulator, and the needle electrode clip was connected to the needle electrode. The stimulator that produces an adjustable electrical pulse that travels to the sacral nerve plexus via the tibial nerve was increased until a patient response was observed.    Performed By: [facility] LPN  Follow Up: keep scheduled [place]

## 2024-03-03 NOTE — Patient Instructions (Signed)

## 2024-03-10 ENCOUNTER — Ambulatory Visit

## 2024-03-10 DIAGNOSIS — N3281 Overactive bladder: Secondary | ICD-10-CM | POA: Diagnosis not present

## 2024-03-10 NOTE — Progress Notes (Signed)
 PTNS  Session # 3 of 12  PTNS was prescribed for the patient's Overactive Bladder symptoms. The voiding diary and/or patients symptoms were reviewed prior to the start of treatment.   Patient Goals: reduced urgency, reduced frequency, No accidents void every 3 hours   Health & Social Factors: no change  Caffeine: denies use Alcohol: no Daytime voids #per day: 8 Night-time voids #per night: 1 Urgency: between mild and strong  Incontinence Episodes #per day: n/a Treatment Plan/Comments: no fluids before bed   Ankle used: right Treatment Setting: 9 Feeling/ Response: positive sensation  PTNS Treatment The needle electrode was inserted into the lower, inner aspect of patient's leg. The surface electrode was placed on the inside arch of the foot on the treatment leg.The lead set was connected to the stimulator, and the needle electrode clip was connected to the needle electrode. The stimulator that produces an adjustable electrical pulse that travels to the sacral nerve plexus via the tibial nerve was increased until a patient response was observed.    Performed By: Exie LANES CMA  Follow Up: as scheduled

## 2024-03-10 NOTE — Patient Instructions (Signed)

## 2024-03-17 ENCOUNTER — Ambulatory Visit

## 2024-03-17 DIAGNOSIS — N3281 Overactive bladder: Secondary | ICD-10-CM

## 2024-03-17 NOTE — Patient Instructions (Signed)

## 2024-03-17 NOTE — Progress Notes (Signed)
 PTNS  Session # 4 of 12  PTNS was prescribed for the patient's Overactive Bladder symptoms. The voiding diary and/or patients symptoms were reviewed prior to the start of treatment.   Patient Goals: Reduced urgency, reduced frequency , no accidents and void every 3 hours   Health & Social Factors: No Change  Caffeine: denies use Alcohol: no Daytime voids #per day: 6-8 Night-time voids #per night: 1 Urgency: Mild Incontinence Episodes #per day: 4 Treatment Plan/Comments: No fluids before bed   Ankle used: left Treatment Setting: 5 Feeling/ Response: tingle sensation  PTNS Treatment The needle electrode was inserted into the lower, inner aspect of patient's leg. The surface electrode was placed on the inside arch of the foot on the treatment leg.The lead set was connected to the stimulator, and the needle electrode clip was connected to the needle electrode. The stimulator that produces an adjustable electrical pulse that travels to the sacral nerve plexus via the tibial nerve was increased until a patient response was observed.    Performed By: Exie LANES CMA  Follow Up: As scheduled

## 2024-03-22 ENCOUNTER — Ambulatory Visit

## 2024-03-22 DIAGNOSIS — N3281 Overactive bladder: Secondary | ICD-10-CM

## 2024-03-22 NOTE — Patient Instructions (Signed)

## 2024-03-22 NOTE — Progress Notes (Signed)
 PTNS  Session # 5 of 12  PTNS was prescribed for the patient's Overactive Bladder symptoms. The voiding diary and/or patients symptoms were reviewed prior to the start of treatment.   Patient Goals: Reduce Urgency  Health & Social Factors:  Caffeine: denies use Alcohol: no Daytime voids #per day: 4 Night-time voids #per night: 1 Urgency: no Incontinence Episodes #per day: 0 Treatment Plan/Comments: no fluids before bed  Ankle used: right Treatment Setting: 7 Feeling/ Response: positive sensation  PTNS Treatment The needle electrode was inserted into the lower, inner aspect of patient's leg. The surface electrode was placed on the inside arch of the foot on the treatment leg.The lead set was connected to the stimulator, and the needle electrode clip was connected to the needle electrode. The stimulator that produces an adjustable electrical pulse that travels to the sacral nerve plexus via the tibial nerve was increased until a patient response was observed.    Performed By: Carlos, CMA  Follow Up: Weekly PTNS

## 2024-03-23 ENCOUNTER — Telehealth: Payer: Self-pay

## 2024-03-23 NOTE — Telephone Encounter (Signed)
 Pt returned phone call to confirm appt change pt was informed appt had been cancelled and she would resume her treatment on 08/28 pt voiced her understanding

## 2024-03-23 NOTE — Telephone Encounter (Signed)
 Detailed message left on pt answer machine making her aware appt for 03/31/24 cancel and PTNS will resume on 04/07/2024.

## 2024-03-24 ENCOUNTER — Ambulatory Visit

## 2024-03-31 ENCOUNTER — Ambulatory Visit

## 2024-04-06 ENCOUNTER — Ambulatory Visit

## 2024-04-07 ENCOUNTER — Ambulatory Visit (INDEPENDENT_AMBULATORY_CARE_PROVIDER_SITE_OTHER)

## 2024-04-07 ENCOUNTER — Ambulatory Visit

## 2024-04-07 DIAGNOSIS — N3281 Overactive bladder: Secondary | ICD-10-CM | POA: Diagnosis not present

## 2024-04-07 NOTE — Patient Instructions (Signed)

## 2024-04-07 NOTE — Progress Notes (Signed)
 PTNS  Session # 6 of 12  PTNS was prescribed for the patient's Overactive Bladder symptoms. The voiding diary and/or patients symptoms were reviewed prior to the start of treatment.   Patient Goals: Reduced Urgency  Health & Social Factors:  No Change Caffeine: 0 Alcohol: no Daytime voids #per day: 8 Night-time voids #per night: 1 Urgency: yes- Mild Incontinence Episodes #per day: 4 Treatment Plan/Comments: no fluids before bed  Ankle used: right Treatment Setting: 8 Feeling/ Response: positive sensation  PTNS Treatment The needle electrode was inserted into the lower, inner aspect of patient's leg. The surface electrode was placed on the inside arch of the foot on the treatment leg.The lead set was connected to the stimulator, and the needle electrode clip was connected to the needle electrode. The stimulator that produces an adjustable electrical pulse that travels to the sacral nerve plexus via the tibial nerve was increased until a patient response was observed.    Performed By: Carlos, CMA  Follow Up: Keep weekly PTNS

## 2024-04-13 ENCOUNTER — Ambulatory Visit (INDEPENDENT_AMBULATORY_CARE_PROVIDER_SITE_OTHER)

## 2024-04-13 DIAGNOSIS — N3281 Overactive bladder: Secondary | ICD-10-CM | POA: Diagnosis not present

## 2024-04-13 NOTE — Progress Notes (Signed)
 PTNS  Session # 7 of 12  PTNS was prescribed for the patient's Overactive Bladder symptoms. The voiding diary and/or patients symptoms were reviewed prior to the start of treatment.   Patient Goals: Reduced Urgency  Health & Social Factors:  Caffeine: None Alcohol: no Daytime voids #per day: 7 Night-time voids #per night: 1 Urgency: yes- Mild Incontinence Episodes #per day:  Treatment Plan/Comments: Toilet every 3 hours  Ankle used: right Treatment Setting: 11 Feeling/ Response: positive sensation  PTNS Treatment The needle electrode was inserted into the lower, inner aspect of patient's leg. The surface electrode was placed on the inside arch of the foot on the treatment leg.The lead set was connected to the stimulator, and the needle electrode clip was connected to the needle electrode. The stimulator that produces an adjustable electrical pulse that travels to the sacral nerve plexus via the tibial nerve was increased until a patient response was observed.    Performed By: Carlos, CMA  Follow Up: Keep Weekly PTNS

## 2024-04-13 NOTE — Patient Instructions (Signed)

## 2024-04-14 ENCOUNTER — Ambulatory Visit

## 2024-04-20 ENCOUNTER — Ambulatory Visit (INDEPENDENT_AMBULATORY_CARE_PROVIDER_SITE_OTHER)

## 2024-04-20 DIAGNOSIS — N3281 Overactive bladder: Secondary | ICD-10-CM | POA: Diagnosis not present

## 2024-04-20 NOTE — Patient Instructions (Signed)

## 2024-04-20 NOTE — Progress Notes (Signed)
 PTNS  Session # 8 of 12  PTNS was prescribed for the patient's Overactive Bladder symptoms. The voiding diary and/or patients symptoms were reviewed prior to the start of treatment.   Patient Goals: reduced urgency and frequency, no accidents, void every 3 hours  Health & Social Factors: no change Caffeine: denies use Alcohol: no Daytime voids #per day: 8 Night-time voids #per night: 1 Urgency: yes, mild Incontinence Episodes #per day: n/a Treatment Plan/Comments: reduce fluids, toilet every 3 hours  Ankle used: right Treatment Setting: 3 Feeling/ Response: positive sensation  PTNS Treatment The needle electrode was inserted into the lower, inner aspect of patient's leg. The surface electrode was placed on the inside arch of the foot on the treatment leg.The lead set was connected to the stimulator, and the needle electrode clip was connected to the needle electrode. The stimulator that produces an adjustable electrical pulse that travels to the sacral nerve plexus via the tibial nerve was increased until a patient response was observed.    Performed By: Veleria, CMA  Follow Up: as scheduled

## 2024-04-21 ENCOUNTER — Ambulatory Visit

## 2024-04-24 ENCOUNTER — Other Ambulatory Visit: Payer: Self-pay | Admitting: Family Medicine

## 2024-04-24 DIAGNOSIS — E7800 Pure hypercholesterolemia, unspecified: Secondary | ICD-10-CM

## 2024-04-27 ENCOUNTER — Ambulatory Visit

## 2024-04-27 DIAGNOSIS — N3281 Overactive bladder: Secondary | ICD-10-CM

## 2024-04-27 NOTE — Patient Instructions (Signed)

## 2024-04-27 NOTE — Progress Notes (Signed)
 PTNS  Session # 9 of 12  PTNS was prescribed for the patient's Overactive Bladder symptoms. The voiding diary and/or patients symptoms were reviewed prior to the start of treatment.   Patient Goals: Reduced Urgency  Health & Social Factors:  Caffeine: 0 Alcohol: no Daytime voids #per day: 8 Night-time voids #per night: 1 Urgency: yes Incontinence Episodes #per day: 6-8 Treatment Plan/Comments: Toilet every 2 hours  Ankle used: right Treatment Setting: 2 Feeling/ Response: positive sensation  PTNS Treatment The needle electrode was inserted into the lower, inner aspect of patient's leg. The surface electrode was placed on the inside arch of the foot on the treatment leg.The lead set was connected to the stimulator, and the needle electrode clip was connected to the needle electrode. The stimulator that produces an adjustable electrical pulse that travels to the sacral nerve plexus via the tibial nerve was increased until a patient response was observed.    Performed By: Carlos, CMA  Follow Up: Keep PTNS appt

## 2024-04-28 ENCOUNTER — Ambulatory Visit

## 2024-05-04 ENCOUNTER — Ambulatory Visit

## 2024-05-04 DIAGNOSIS — N3281 Overactive bladder: Secondary | ICD-10-CM

## 2024-05-04 NOTE — Patient Instructions (Signed)

## 2024-05-04 NOTE — Progress Notes (Signed)
 PTNS  Session # 10 of 12  PTNS was prescribed for the patient's Overactive Bladder symptoms. The voiding diary and/or patients symptoms were reviewed prior to the start of treatment.   Patient Goals: Reduced urgency  Health & Social Factors:  no change Caffeine: 0 Alcohol: no Daytime voids #per day: 7 Night-time voids #per night: 1 Urgency: yes mild Incontinence Episodes #per day: 2 Treatment Plan/Comments: 2  Ankle used: right Treatment Setting: 2-3 Feeling/ Response: positive sensation  PTNS Treatment The needle electrode was inserted into the lower, inner aspect of patient's leg. The surface electrode was placed on the inside arch of the foot on the treatment leg.The lead set was connected to the stimulator, and the needle electrode clip was connected to the needle electrode. The stimulator that produces an adjustable electrical pulse that travels to the sacral nerve plexus via the tibial nerve was increased until a patient response was observed.    Performed By: Carlos, CMA  Follow Up: Keep weekly PTNS

## 2024-05-05 ENCOUNTER — Ambulatory Visit

## 2024-05-10 ENCOUNTER — Ambulatory Visit

## 2024-05-10 DIAGNOSIS — N3281 Overactive bladder: Secondary | ICD-10-CM

## 2024-05-10 NOTE — Progress Notes (Signed)
 PTNS  Session # 11 of 12  PTNS was prescribed for the patient's Overactive Bladder symptoms. The voiding diary and/or patients symptoms were reviewed prior to the start of treatment.   Patient Goals: Reduced Urgency  Health & Social Factors: no chnage Caffeine: 0 Alcohol: no Daytime voids #per day: 8 Night-time voids #per night: 1 Urgency: yes mild Incontinence Episodes #per day: 3 Treatment Plan/Comments: Reduce fluids  Ankle used: left Treatment Setting: 2 Feeling/ Response: positive sensation  PTNS Treatment The needle electrode was inserted into the lower, inner aspect of patient's leg. The surface electrode was placed on the inside arch of the foot on the treatment leg.The lead set was connected to the stimulator, and the needle electrode clip was connected to the needle electrode. The stimulator that produces an adjustable electrical pulse that travels to the sacral nerve plexus via the tibial nerve was increased until a patient response was observed.    Performed By: Carlos, CMA  Follow Up: Keep weekly PTNS

## 2024-05-10 NOTE — Patient Instructions (Signed)

## 2024-05-12 ENCOUNTER — Ambulatory Visit

## 2024-05-16 ENCOUNTER — Ambulatory Visit

## 2024-05-17 ENCOUNTER — Ambulatory Visit (INDEPENDENT_AMBULATORY_CARE_PROVIDER_SITE_OTHER)

## 2024-05-17 DIAGNOSIS — N3281 Overactive bladder: Secondary | ICD-10-CM

## 2024-05-17 NOTE — Progress Notes (Signed)
 PTNS  Session # 12 of 12  PTNS was prescribed for the patient's Overactive Bladder symptoms. The voiding diary and/or patients symptoms were reviewed prior to the start of treatment.   Patient Goals: Reduced urgency  Health & Social Factors: no change Caffeine: 0 Alcohol: no Daytime voids #per day: 8 Night-time voids #per night: 1 Urgency: yes mild Incontinence Episodes #per day: 3 Treatment Plan/Comments: Toilet every three hours  Ankle used: left Treatment Setting: 8 Feeling/ Response: positive sensation  PTNS Treatment The needle electrode was inserted into the lower, inner aspect of patient's leg. The surface electrode was placed on the inside arch of the foot on the treatment leg.The lead set was connected to the stimulator, and the needle electrode clip was connected to the needle electrode. The stimulator that produces an adjustable electrical pulse that travels to the sacral nerve plexus via the tibial nerve was increased until a patient response was observed.    Performed By: Carlos, CMA  Follow Up: Keep OV with MD, Nieves

## 2024-05-23 ENCOUNTER — Telehealth: Payer: Self-pay

## 2024-05-23 NOTE — Telephone Encounter (Signed)
 Called pharmacy to let them know MD Nieves is not pt PCP pharmacy tech made aware tech advised us  to disregard

## 2024-05-23 NOTE — Telephone Encounter (Signed)
-----   Message from Nurse [facility] sent at 05/23/2024 10:30 AM EDT ----- Possible duplicate

## 2024-06-20 ENCOUNTER — Ambulatory Visit: Payer: Self-pay | Admitting: Family Medicine

## 2024-06-20 ENCOUNTER — Encounter: Payer: Self-pay | Admitting: Family Medicine

## 2024-06-20 VITALS — BP 143/73 | HR 73 | Ht 62.0 in | Wt 117.0 lb

## 2024-06-20 DIAGNOSIS — G20A1 Parkinson's disease without dyskinesia, without mention of fluctuations: Secondary | ICD-10-CM

## 2024-06-20 DIAGNOSIS — Z8673 Personal history of transient ischemic attack (TIA), and cerebral infarction without residual deficits: Secondary | ICD-10-CM

## 2024-06-20 DIAGNOSIS — I10 Essential (primary) hypertension: Secondary | ICD-10-CM

## 2024-06-20 DIAGNOSIS — E7800 Pure hypercholesterolemia, unspecified: Secondary | ICD-10-CM | POA: Diagnosis not present

## 2024-06-20 LAB — LIPID PANEL

## 2024-06-20 MED ORDER — CLOPIDOGREL BISULFATE 75 MG PO TABS: 75.0000 mg | ORAL_TABLET | Freq: Every day | ORAL | 3 refills | Status: AC

## 2024-06-20 MED ORDER — ROSUVASTATIN CALCIUM 10 MG PO TABS: 10.0000 mg | ORAL_TABLET | Freq: Every day | ORAL | 3 refills | Status: AC

## 2024-06-20 NOTE — Progress Notes (Signed)
 BP (!) 143/73   Pulse 73   Ht 5' 2 (1.575 m)   Wt 117 lb (53.1 kg)   SpO2 97%   BMI 21.40 kg/m    Subjective:   Patient ID: Tracey Juarez, female    DOB: 05/05/1941, 83 y.o.   MRN: [id number]  HPI: Tracey Juarez is a 83 y.o. female presenting on 06/20/2024 for Medical Management of Chronic Issues and Hypertension   Discussed the use of AI scribe software for clinical note transcription with the patient, who gave verbal consent to proceed.  History of Present Illness   Tracey Juarez is an 83 year old female with hypertension and Parkinson's disease who presents for a recheck of her conditions.  Hypertension management - Continues lisinopril  at a low dose of 5 mg, taking half a tablet daily - Monitors blood pressure at home, which remains well controlled  Parkinson's disease and mobility - Parkinson's disease remains stable under neurology care - Difficulty with ambulation due to stiffness, attributed to arthritis - Uses Tylenol  for arthritis pain - Previously received home health therapy for strength and mobility, currently unable to arrange for home visits  Antiplatelet therapy - On Plavix  for anticoagulation - No bleeding, hematuria, or hematochezia  Hyperlipidemia management - Taking rosuvastatin  for cholesterol control  Urinary symptoms and recurrent urinary tract infections - History of recurrent urinary tract infections - Previously used estrogen vaginal cream for UTI prevention, currently not using - Occasional urinary burning, attributed to medication  Cutaneous findings - History of sun damage - Monitors skin spots vigilantly - Uses lotion for skin care  Supplement use - Takes a daily multivitamin          Relevant past medical, surgical, family and social history reviewed and updated as indicated. Interim medical history since our last visit reviewed. Allergies and medications reviewed and updated.  Review of Systems   Constitutional:  Negative for chills and fever.  Eyes:  Negative for redness and visual disturbance.  Respiratory:  Negative for chest tightness and shortness of breath.   Cardiovascular:  Negative for chest pain and leg swelling.  Genitourinary:  Negative for difficulty urinating and dysuria.  Musculoskeletal:  Positive for arthralgias and gait problem. Negative for back pain.  Skin:  Negative for rash.  Neurological:  Negative for light-headedness and headaches.  Psychiatric/Behavioral:  Negative for agitation and behavioral problems.   All other systems reviewed and are negative.   Per HPI unless specifically indicated above   Allergies as of 06/20/2024       Reactions   Cat Dander    Ondansetron  Swelling, Palpitations        Medication List        Accurate as of June 20, 2024  2:13 PM. If you have any questions, ask your nurse or doctor.          acetaminophen  325 MG tablet Commonly known as: TYLENOL  Take 650 mg by mouth every 6 (six) hours as needed.   Carbidopa -Levodopa  ER 25-100 MG tablet controlled release Commonly known as: SINEMET  CR Take 1 tablet by mouth 3 (three) times daily.   cetirizine 10 MG tablet Commonly known as: ZYRTEC Take 10 mg by mouth daily.   clopidogrel  75 MG tablet Commonly known as: PLAVIX  Take 1 tablet (75 mg total) by mouth daily. Restart on 08/01/23   estradiol  0.1 MG/GM vaginal cream Commonly known as: ESTRACE  Discard plastic applicator. Insert a blueberry size amount (approximately 1 gram) of cream on fingertip inside  vagina at bedtime every night for 1 week then every other night. For long term use.   famotidine  10 MG tablet Commonly known as: PEPCID  Take 10 mg by mouth daily.   feeding supplement Liqd Take 1 Container by mouth 3 (three) times daily between meals.   fluticasone  50 MCG/ACT nasal spray Commonly known as: FLONASE  USE 1 SPRAY(S) IN EACH NOSTRIL TWICE DAILY AS NEEDED FOR ALLERGIES OR RHINITIS    Gemtesa  75 MG Tabs Generic drug: Vibegron  TAKE 1 TABLET BY MOUTH EVERY DAY   levETIRAcetam  500 MG tablet Commonly known as: KEPPRA  Take 1 tablet (500 mg total) by mouth 2 (two) times daily.   lisinopril  5 MG tablet Commonly known as: ZESTRIL  Take 1 tablet (5 mg total) by mouth daily.   meclizine  12.5 MG tablet Commonly known as: ANTIVERT  TAKE 1 TO 2 TABLETS BY MOUTH THREE TIMES DAILY AS NEEDED FOR DIZZINESS   rosuvastatin  10 MG tablet Commonly known as: CRESTOR  Take 1 tablet (10 mg total) by mouth daily.         Objective:   BP (!) 143/73   Pulse 73   Ht 5' 2 (1.575 m)   Wt 117 lb (53.1 kg)   SpO2 97%   BMI 21.40 kg/m   Wt Readings from Last 3 Encounters:  06/20/24 117 lb (53.1 kg)  12/17/23 114 lb (51.7 kg)  12/16/23 113 lb (51.3 kg)    Physical Exam Physical Exam   VITALS: BP- 143/73 CHEST: Lungs clear to auscultation. CARDIOVASCULAR: Heart sounds regular.         Assessment & Plan:   Problem List Items Addressed This Visit       Cardiovascular and Mediastinum   Hypertension - Primary   Relevant Medications   rosuvastatin  (CRESTOR ) 10 MG tablet   Other Relevant Orders   CBC With Diff/Platelet   CMP14+EGFR   Lipid panel     Nervous and Auditory   Parkinson's disease without dyskinesia or fluctuating manifestations (HCC)     Other   Pure hypercholesterolemia   Relevant Medications   rosuvastatin  (CRESTOR ) 10 MG tablet   Other Relevant Orders   CBC With Diff/Platelet   CMP14+EGFR   Lipid panel   History of CVA (cerebrovascular accident)   Relevant Medications   clopidogrel  (PLAVIX ) 75 MG tablet       Essential hypertension Blood pressure slightly elevated at 143/73 mmHg, acceptable with home readings. On lisinopril  5 mg, half tablet due to low diastolic readings. - Continue lisinopril  5 mg, half tablet daily. - Monitor blood pressure at home regularly.  Hypercholesterolemia Managed with rosuvastatin . - Ordered blood work to check  cholesterol levels.  Parkinson's disease Well-managed, no significant changes. Affects gait and mobility. - Continue current management with neurology.  Osteoarthritis Causes stiffness and mobility issues. No surgical interventions due to age. Home health therapy recommended but scheduling issues present. - Use Tylenol  for pain management. - Apply Voltaren  gel for arthritis pain. - Follow up with home health therapy to improve strength and mobility.  Urge incontinence and history of recurrent urinary tract infections Urge incontinence persists, history of recurrent UTIs. Discussed resuming estrogen cream for tissue health and UTI risk reduction. - Consider resuming estrogen vaginal cream once a week if UTIs recur. - Contact pharmacy for estrogen cream refill if needed.  Actinic keratosis and sun-damaged skin Sun-damaged skin and actinic keratosis present.          Follow up plan: Return in about 6 months (around 12/18/2024), or if symptoms  worsen or fail to improve, for Hypertension and hyperlipidemia and Parkinson's.  Counseling provided for all of the vaccine components Orders Placed This Encounter  Procedures   CBC With Diff/Platelet   CMP14+EGFR   Lipid panel    Fonda Levins, MD [facility] 06/20/2024, 2:13 PM

## 2024-06-21 LAB — LIPID PANEL
Cholesterol, Total: 164 mg/dL (ref 100–199)
HDL: 85 mg/dL (ref >39)
LDL CALC COMMENT:: 1.9 ratio (ref 0.0–4.4)
LDL Chol Calc (NIH): 69 mg/dL (ref 0–99)
Triglycerides: 46 mg/dL (ref 0–149)
VLDL Cholesterol Cal: 10 mg/dL (ref 5–40)

## 2024-06-21 LAB — CBC WITH DIFF/PLATELET
Basophils Absolute: 0 x10E3/uL (ref 0.0–0.2)
Basos: 0 %
EOS (ABSOLUTE): 0 x10E3/uL (ref 0.0–0.4)
Eos: 1 %
Hematocrit: 38.7 % (ref 34.0–46.6)
Hemoglobin: 12.6 g/dL (ref 11.1–15.9)
Immature Grans (Abs): 0 x10E3/uL (ref 0.0–0.1)
Immature Granulocytes: 0 %
Lymphocytes Absolute: 1.1 x10E3/uL (ref 0.7–3.1)
Lymphs: 22 %
MCH: 32 pg (ref 26.6–33.0)
MCHC: 32.6 g/dL (ref 31.5–35.7)
MCV: 98 fL — ABNORMAL HIGH (ref 79–97)
Monocytes Absolute: 0.7 x10E3/uL (ref 0.1–0.9)
Monocytes: 13 %
Neutrophils Absolute: 3.4 x10E3/uL (ref 1.4–7.0)
Neutrophils: 64 %
Platelets: 192 x10E3/uL (ref 150–450)
RBC: 3.94 x10E6/uL (ref 3.77–5.28)
RDW: 12.4 % (ref 11.7–15.4)
WBC: 5.3 x10E3/uL (ref 3.4–10.8)

## 2024-06-21 LAB — CMP14+EGFR
ALT: 6 IU/L (ref 0–32)
AST: 21 IU/L (ref 0–40)
Albumin: 4.3 g/dL (ref 3.7–4.7)
Alkaline Phosphatase: 51 IU/L (ref 48–129)
BUN/Creatinine Ratio: 27 (ref 12–28)
BUN: 20 mg/dL (ref 8–27)
Bilirubin Total: 0.5 mg/dL (ref 0.0–1.2)
CO2: 24 mmol/L (ref 20–29)
Calcium: 9.4 mg/dL (ref 8.7–10.3)
Chloride: 104 mmol/L (ref 96–106)
Creatinine, Ser: 0.73 mg/dL (ref 0.57–1.00)
Globulin, Total: 2.3 g/dL (ref 1.5–4.5)
Glucose: 81 mg/dL (ref 70–99)
Potassium: 4.2 mmol/L (ref 3.5–5.2)
Sodium: 141 mmol/L (ref 134–144)
Total Protein: 6.6 g/dL (ref 6.0–8.5)
eGFR: 82 mL/min/1.73 (ref >59)

## 2024-06-24 ENCOUNTER — Telehealth: Payer: Self-pay

## 2024-06-24 NOTE — Telephone Encounter (Signed)
 TCB to [facility] they have gone through a long list of agencies, gave her [facility]onso CHEADLE of [place] ph # [phone]

## 2024-06-24 NOTE — Telephone Encounter (Signed)
 Copied from CRM #[id number]. Topic: Clinical - Medical Advice >> Jun 24, 2024 11:34 AM Nathanel BROCKS wrote: Reason for CRM: [facility], [phone] they have tried to set up home health but they have exhausted the resources that they have it that area. Is there anything you can do to possibly assist them with.

## 2024-06-24 NOTE — Telephone Encounter (Signed)
**Note De-identified  [place] Obfuscation** Please advise 

## 2024-06-29 ENCOUNTER — Ambulatory Visit: Payer: Self-pay | Admitting: Family Medicine

## 2024-07-04 ENCOUNTER — Telehealth: Payer: Self-pay

## 2024-07-04 ENCOUNTER — Other Ambulatory Visit

## 2024-07-04 ENCOUNTER — Ambulatory Visit: Payer: Self-pay

## 2024-07-04 DIAGNOSIS — R300 Dysuria: Secondary | ICD-10-CM

## 2024-07-04 LAB — URINALYSIS, ROUTINE W REFLEX MICROSCOPIC
Bilirubin, UA: NEGATIVE
Glucose, UA: NEGATIVE
Ketones, UA: NEGATIVE
Leukocytes,UA: NEGATIVE
Nitrite, UA: NEGATIVE
Specific Gravity, UA: 1.02 (ref 1.005–1.030)
Urobilinogen, Ur: 1 mg/dL (ref 0.2–1.0)
pH, UA: 7 (ref 5.0–7.5)

## 2024-07-04 LAB — MICROSCOPIC EXAMINATION
Bacteria, UA: NONE SEEN
Epithelial Cells (non renal): NONE SEEN /HPF (ref 0–10)
Renal Epithel, UA: NONE SEEN /HPF
WBC, UA: NONE SEEN /HPF (ref 0–5)
Yeast, UA: NONE SEEN

## 2024-07-04 NOTE — Telephone Encounter (Signed)
 Spoke with pt and appt scheduled.

## 2024-07-04 NOTE — Telephone Encounter (Signed)
 Orders placed.

## 2024-07-04 NOTE — Telephone Encounter (Signed)
 Copied from CRM #[id number]. Topic: Clinical - Red Word Triage >> Jul 04, 2024 12:19 PM Emylou G wrote: Patient called.. sending in speciman.Tracey Juarez

## 2024-07-04 NOTE — Addendum Note (Signed)
 Addended by: MILAS KENT D on: 07/04/2024 01:59 PM   Modules accepted: Orders

## 2024-07-04 NOTE — Telephone Encounter (Signed)
 FYI Only or Action Required?: Action required by provider: Patient is requesting an antibiotic, declines OV as she does not have transportation to clinic, this RN advised OV may still be needed .  Patient was last seen in primary care on 06/20/2024 by Dettinger, Fonda LABOR, MD.  Called Nurse Triage reporting Dysuria.  Symptoms began yesterday.  Interventions attempted: OTC medications: Tylenol .  Symptoms are: unchanged.  Triage Disposition: See HCP Within 4 Hours (Or PCP Triage)  Patient/caregiver understands and will follow disposition?: No, wishes to speak with PCP     Copied from CRM #[id number]. Topic: Clinical - Red Word Triage >> Jul 04, 2024  7:43 AM Tobias CROME wrote: Red Word that prompted transfer to Nurse Triage: possible UTI, Requesting tele visit. burning when urinating, having to go frequently Reason for Disposition  Side (flank) or lower back pain present  Answer Assessment - Initial Assessment Questions 1. SEVERITY: How bad is the pain?  (e.g., Scale 1-10; mild, moderate, or severe)     6/10 2. FREQUENCY: How many times have you had painful urination today?      Every 45 min-1 hr she is urinating, painful every time 3. PATTERN: Is pain present every time you urinate or just sometimes?      Every time 4. ONSET: When did the painful urination start?      Yesterday 5. FEVER: Do you have a fever? If Yes, ask: What is your temperature, how was it measured, and when did it start?     Pt notes mild chills yesterday afternoon 6. PAST UTI: Have you had a urine infection before? If Yes, ask: When was the last time? and What happened that time?      Yes, not recent 7. CAUSE: What do you think is causing the painful urination?  (e.g., UTI, scratch, Herpes sore)     UTI 8. OTHER SYMPTOMS: Do you have any other symptoms? (e.g., blood in urine, flank pain, genital sores, urgency, vaginal discharge)     Mid back pain bilateral  Protocols used: Urination Pain -  Female-A-AH

## 2024-07-05 ENCOUNTER — Ambulatory Visit (INDEPENDENT_AMBULATORY_CARE_PROVIDER_SITE_OTHER): Admitting: Nurse Practitioner

## 2024-07-05 ENCOUNTER — Ambulatory Visit: Payer: Self-pay | Admitting: Nurse Practitioner

## 2024-07-05 ENCOUNTER — Encounter: Payer: Self-pay | Admitting: Nurse Practitioner

## 2024-07-05 VITALS — BP 148/69 | HR 69 | Temp 97.3°F | Ht 62.0 in | Wt 117.0 lb

## 2024-07-05 DIAGNOSIS — R350 Frequency of micturition: Secondary | ICD-10-CM

## 2024-07-05 NOTE — Patient Instructions (Signed)
 Peeing Often (Urinary Frequency) in Adults Urinary frequency means peeing, or urinating, more often than usual. This is also called overactive bladder. People usually pee 6 or 7 times in 24 hours. If you're concerned that you pee more often than usual, or if peeing often is starting to affect your day-to-day life, you may have an overactive bladder. How often you pee depends on: How much you drink and what you drink. How much you exercise. What medicines you're taking. The health of your bladder muscles and pelvic muscles. If you pee often, you may also feel an urgent need to pee. The stress and anxiety of needing to find a bathroom quickly can make this problem feel worse. This problem may go away on its own. You may also wish to seek treatment. Home treatments are the first step in treating the problem. Surgery may be done if home treatment fails. Follow these instructions at home: Bladder health Your health care provider can suggest ways to help you improve your bladder health. You may be told to: Start a bladder diary. Keep track of these things: How often you pee. How much you pee at one time. What you eat and drink. If you leak any pee. Follow a bladder training program. You may be told to: Wait before going to the bathroom. In doing this, you learn to ignore some of the urges to pee. Wait a minute after you pee and try to pee again. This helps if you feel you are not completely emptying your bladder. Set a time to pee. The goal is to slowly increase the amount of time in which you wait to pee. Do Kegel exercises. These exercises help the muscles that control peeing to become stronger. This may help you control the urge to pee.  Eating and drinking Eat and drink as told. You may be told to: Avoid caffeine. Drink small amounts of fluid more often. Avoid drinking in the evening. Stop drinking alcohol. Avoid foods or drinks that may irritate the bladder, such as: Coffee, tea, or  soda. Artificial sweeteners. Citrus fruits, tomato-based foods, and chocolate. Urinary frequency may be worse if you have trouble pooping (constipation). You may need to take these steps to help prevent or treat the problem: Take medicines to help you poop. Eat foods high in fiber, like beans, whole grains, and fresh fruits and vegetables. Drink more fluids as told. General instructions Take your medicines only as told. Keep all follow-up visits. Your provider needs to check if the home treatments are working or if you need more treatment. Contact a health care provider if: You start peeing more than before. You feel pain when you pee. You notice blood in your pee. Your pee looks cloudy. You have a fever or chills. You throw up or you feel like you may throw up. Get help right away if: You can't pee. This information is not intended to replace advice given to you by your health care provider. Make sure you discuss any questions you have with your health care provider. Document Revised: 05/07/2023 Document Reviewed: 05/07/2023 Elsevier Patient Education  2025 [employer].

## 2024-07-05 NOTE — Progress Notes (Signed)
 Subjective:    Patient ID: Tracey Juarez, female    DOB: August 30, 1940, 83 y.o.   MRN: [id number]   Chief Complaint: Dysuria   Dysuria  This is a new problem. The current episode started in the past 7 days. The problem occurs intermittently. The problem has been waxing and waning. The quality of the pain is described as burning. The pain is at a severity of 7/10. There has been no fever. She is Not sexually active. There is No history of pyelonephritis. Associated symptoms include frequency, hesitancy and urgency. She has tried nothing for the symptoms. The treatment provided mild relief.   Patient called [facility] yesterday stating she had UTI but did not want to be seen. She brought urine in yesterday and nurse called and made hr an appointment. 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  Eyes:  Negative for pain.  Respiratory:  Negative for shortness of breath.   Cardiovascular:  Negative for chest pain, palpitations and leg swelling.  Gastrointestinal:  Negative for abdominal pain.  Endocrine: Negative for polydipsia.  Genitourinary:  Positive for dysuria, frequency, hesitancy and urgency.  Skin:  Negative for rash.   Neurological:  Negative for dizziness, weakness and headaches.  Hematological:  Does not bruise/bleed easily.  All other systems reviewed and are negative.      Objective:   Physical Exam Constitutional:      Appearance: Normal appearance.  Cardiovascular:     Rate and Rhythm: Normal rate and regular rhythm.     Heart sounds: Normal heart sounds.  Pulmonary:     Effort: Pulmonary effort is normal.     Breath sounds: Normal breath sounds.  Abdominal:     General: There is no distension.     Tenderness: There is no abdominal tenderness. There is no right CVA tenderness or left CVA tenderness.  Skin:    General: Skin is warm.  Neurological:     General: No focal deficit present.     Mental Status: She is alert and oriented to person, place, and time.  Psychiatric:        Mood and Affect: Mood normal.        Behavior: Behavior normal.    BP (!) 148/69   Pulse 69   Temp (!) 97.3 F (36.3 C) (Temporal)   Ht 5' 2 (1.575 m)   Wt 117 lb (53.1 kg)   SpO2 97%   BMI 21.40 kg/m         Assessment & Plan:   Calee Nugent in today with chief complaint of Dysuria   1. Urinary frequency (Primary) Cranberry juice RTO prn    The above assessment and management plan was discussed with the patient. The patient verbalized understanding of and  has agreed to the management plan. Patient is aware to call the clinic if symptoms persist or worsen. Patient is aware when to return to the clinic for a follow-up visit. Patient educated on when it is appropriate to go to the emergency department.   Mary-Margaret Gladis, FNP

## 2024-07-07 LAB — URINE CULTURE

## 2024-07-15 ENCOUNTER — Telehealth: Payer: Self-pay

## 2024-07-15 DIAGNOSIS — M810 Age-related osteoporosis without current pathological fracture: Secondary | ICD-10-CM

## 2024-07-15 MED ORDER — DENOSUMAB 60 MG/ML ~~LOC~~ SOSY: 60.0000 mg | PREFILLED_SYRINGE | Freq: Once | SUBCUTANEOUS | Status: DC

## 2024-07-15 NOTE — Telephone Encounter (Signed)
 Prolia  sent for benefit verification

## 2024-07-16 ENCOUNTER — Other Ambulatory Visit: Payer: Self-pay | Admitting: Family Medicine

## 2024-07-18 ENCOUNTER — Other Ambulatory Visit: Payer: Self-pay | Admitting: Family Medicine

## 2024-07-25 ENCOUNTER — Ambulatory Visit: Admitting: Family Medicine

## 2024-07-25 ENCOUNTER — Encounter: Payer: Self-pay | Admitting: Family Medicine

## 2024-07-25 ENCOUNTER — Ambulatory Visit: Payer: Self-pay

## 2024-07-25 VITALS — BP 141/71 | HR 73 | Ht 62.0 in | Wt 118.0 lb

## 2024-07-25 DIAGNOSIS — M47816 Spondylosis without myelopathy or radiculopathy, lumbar region: Secondary | ICD-10-CM

## 2024-07-25 DIAGNOSIS — R600 Localized edema: Secondary | ICD-10-CM

## 2024-07-25 NOTE — Telephone Encounter (Signed)
 FYI Only or Action Required?: FYI only for provider: appointment scheduled on 07/25/24.  Patient was last seen in primary care on 07/05/2024 by Gladis Mustard, FNP.  Called Nurse Triage reporting Leg Swelling.  Symptoms began several days ago.  Interventions attempted: Rest, hydration, or home remedies.  Symptoms are: gradually worsening.  Triage Disposition: See HCP Within 4 Hours (Or PCP Triage)  Patient/caregiver understands and will follow disposition?: Yes, but will wait   Copied from CRM #[id number]. Topic: Clinical - Red Word Triage >> Jul 25, 2024  7:54 AM Emylou G wrote: [facility] that prompted transfer to Nurse Triage: legs are swollen - one worse than the other.. Reason for Disposition  [1] Thigh or calf pain AND [2] only 1 side AND [3] present > 1 hour  Answer Assessment - Initial Assessment Questions 1. ONSET: When did the swelling start? (e.g., minutes, hours, days)     Few days 2. LOCATION: What part of the leg is swollen?  Are both legs swollen or just one leg?     Bilateral knees to ankles 3. SEVERITY: How bad is the swelling? (e.g., localized; mild, moderate, severe)     Right worse then left 4. REDNESS: Is there redness or signs of infection?     denies 5. PAIN: Is the swelling painful to touch? If Yes, ask: How painful is it?   (Scale 1-10; mild, moderate or severe)     Worse when bending 6. FEVER: Do you have a fever? If Yes, ask: What is it, how was it measured, and when did it start?      denies 7. CAUSE: What do you think is causing the leg swelling?     unknown 8. MEDICAL HISTORY: Do you have a history of blood clots (e.g., DVT), cancer, heart failure, kidney disease, or liver failure?     yes 9. RECURRENT SYMPTOM: Have you had leg swelling before? If Yes, ask: When was the last time? What happened that time?     no 10. OTHER SYMPTOMS: Do you have any other symptoms? (e.g., chest pain, difficulty breathing)        Feels tight 11. PREGNANCY: Is there any chance you are pregnant? When was your last menstrual period?  Protocols used: Leg Swelling and Edema-A-AH

## 2024-07-25 NOTE — Telephone Encounter (Signed)
Noted  -LS

## 2024-07-25 NOTE — Progress Notes (Signed)
 BP (!) 141/71   Pulse 73   Ht 5' 2 (1.575 m)   Wt 118 lb (53.5 kg)   SpO2 93%   BMI 21.58 kg/m    Subjective:   Patient ID: Tracey Juarez, female    DOB: 10/14/1940, 83 y.o.   MRN: [id number]  HPI: Tracey Juarez is a 83 y.o. female presenting on 07/25/2024 for Edema (BLE), Back Pain, and Hip Pain   Discussed the use of AI scribe software for clinical note transcription with the patient, who gave verbal consent to proceed.  History of Present Illness   Tracey Juarez is an 83 year old female with arthritis who presents with worsening leg swelling and soreness.  Lower extremity edema and soreness - Worsening swelling in both legs, particularly from the knees down - Swelling is accompanied by soreness, especially in the skin due to stretching - Edema has been a chronic issue but has recently worsened, possibly related to weather changes - Swelling increases with prolonged sitting, even with foot elevation - Difficulty donning compression stockings or wraps due to limited mobility; requires assistance from neighbor for tasks such as putting on socks  Arthralgia and stiffness - Arthritis involving back, hips, knees, and ankles - Stiffness and soreness are most pronounced in the mornings - Pain and stiffness improve with movement throughout the day  Pain management and medication limitations - Currently takes Tylenol , two tablets at bedtime, with partial relief of symptoms - Uses topical Voltaren  gel but has difficulty applying it effectively - On Plavix  (clopidogrel ), which precludes use of NSAIDs such as ibuprofen or Aleve due to increased bleeding risk          Relevant past medical, surgical, family and social history reviewed and updated as indicated. Interim medical history since our last visit reviewed. Allergies and medications reviewed and updated.  Review of Systems  Constitutional:  Negative for chills and fever.  Eyes:  Negative for redness  and visual disturbance.  Respiratory:  Negative for chest tightness and shortness of breath.   Cardiovascular:  Positive for leg swelling. Negative for chest pain.  Genitourinary:  Negative for difficulty urinating and dysuria.  Musculoskeletal:  Positive for arthralgias, back pain and gait problem.  Skin:  Negative for rash.  Neurological:  Negative for light-headedness and headaches.  Psychiatric/Behavioral:  Negative for agitation and behavioral problems.   All other systems reviewed and are negative.   Per HPI unless specifically indicated above   Allergies as of 07/25/2024       Reactions   Cat Dander    Ondansetron  Swelling, Palpitations        Medication List        Accurate as of July 25, 2024  3:58 PM. If you have any questions, ask your nurse or doctor.          Carbidopa -Levodopa  ER 25-100 MG tablet controlled release Commonly known as: SINEMET  CR Take 1 tablet by mouth 3 (three) times daily. The timing of this medication is very important.   acetaminophen  325 MG tablet Commonly known as: TYLENOL  Take 650 mg by mouth every 6 (six) hours as needed.   cetirizine 10 MG tablet Commonly known as: ZYRTEC Take 10 mg by mouth daily.   clopidogrel  75 MG tablet Commonly known as: PLAVIX  Take 1 tablet (75 mg total) by mouth daily. Restart on 08/01/23   estradiol  0.1 MG/GM vaginal cream Commonly known as: ESTRACE  Discard plastic applicator. Insert a blueberry size amount (approximately 1 gram)  of cream on fingertip inside vagina at bedtime every night for 1 week then every other night. For long term use.   famotidine  10 MG tablet Commonly known as: PEPCID  Take 10 mg by mouth daily.   feeding supplement Liqd Take 1 Container by mouth 3 (three) times daily between meals.   fluticasone  50 MCG/ACT nasal spray Commonly known as: FLONASE  USE 1 SPRAY(S) IN EACH NOSTRIL TWICE DAILY AS NEEDED FOR ALLERGIES OR RHINITIS   Gemtesa  75 MG Tabs Generic drug:  Vibegron  TAKE 1 TABLET BY MOUTH EVERY DAY   levETIRAcetam  500 MG tablet Commonly known as: KEPPRA  Take 1 tablet (500 mg total) by mouth 2 (two) times daily.   lisinopril  5 MG tablet Commonly known as: ZESTRIL  Take 1 tablet (5 mg total) by mouth daily.   meclizine  12.5 MG tablet Commonly known as: ANTIVERT  TAKE 1 TO 2 TABLETS BY MOUTH THREE TIMES DAILY AS NEEDED FOR DIZZINESS   rosuvastatin  10 MG tablet Commonly known as: CRESTOR  Take 1 tablet (10 mg total) by mouth daily.               Durable Medical Equipment  (From admission, onward)           Start     Ordered   07/25/24 0000  For home use only DME Other see comment       Comments: Diagnosis peripheral edema lymphedema  Lymphedema pumps for legs to wear at night, knee length  Question:  Length of Need  Answer:  Lifetime   07/25/24 1552             Objective:   BP (!) 141/71   Pulse 73   Ht 5' 2 (1.575 m)   Wt 118 lb (53.5 kg)   SpO2 93%   BMI 21.58 kg/m   Wt Readings from Last 3 Encounters:  07/25/24 118 lb (53.5 kg)  07/05/24 117 lb (53.1 kg)  06/20/24 117 lb (53.1 kg)    Physical Exam Vitals and nursing note reviewed.  Constitutional:      General: She is not in acute distress.    Appearance: She is well-developed. She is not diaphoretic.  Musculoskeletal:        General: Swelling (2+ edema bilateral lower extremity) and tenderness present.  Skin:    General: Skin is warm and dry.     Findings: No rash.  Neurological:     Mental Status: She is alert and oriented to person, place, and time.     Coordination: Coordination normal.  Psychiatric:        Behavior: Behavior normal.    Physical Exam   SKIN: No signs of skin infection on legs.         Assessment & Plan:   Problem List Items Addressed This Visit   None Visit Diagnoses       Peripheral edema    -  Primary   Relevant Orders   For home use only DME Other see comment     Arthritis, lumbar spine               Chronic venous insufficiency Worsening leg swelling from knees down, likely due to chronic venous insufficiency. Swelling exacerbated by prolonged sitting and standing, with associated skin soreness due to nerve stretching. Compression stockings recommended but difficult to use due to physical limitations. Home therapy not available due to insurance and staffing issues. - Encouraged use of compression stockings daily, except during sleep. - Consider leg wraps as an  alternative to compression stockings. - Encouraged increased mobility to reduce swelling. - Explored alternative home therapy options through social work or [facility].  Osteoarthritis Chronic osteoarthritis with morning stiffness and soreness in knees and back. Current management includes Tylenol , which provides some relief. Limited options for NSAIDs due to concurrent use of Plavix . Voltaren  gel used but application is challenging. - Increase Tylenol  intake to up to six tablets per day as needed for pain relief. - Continue use of Voltaren  gel for localized pain relief.          Follow up plan: Return if symptoms worsen or fail to improve.  Counseling provided for all of the vaccine components Orders Placed This Encounter  Procedures   For home use only DME Other see comment    Fonda Levins, MD [facility] 07/25/2024, 3:58 PM

## 2024-08-08 ENCOUNTER — Telehealth: Payer: Self-pay

## 2024-08-08 DIAGNOSIS — M810 Age-related osteoporosis without current pathological fracture: Secondary | ICD-10-CM

## 2024-08-08 MED ORDER — DENOSUMAB 60 MG/ML ~~LOC~~ SOSY: 60.0000 mg | PREFILLED_SYRINGE | SUBCUTANEOUS | Status: AC

## 2024-08-08 NOTE — Telephone Encounter (Signed)
 Prolia  VOB initiated via [email or web address]  Next Prolia  inj DUE: 08/11/24    *Per [employer] website, Scheduled blackout dates: 08/11/2024 - 08/19/2024.*

## 2024-08-08 NOTE — Telephone Encounter (Signed)
 Prolia  sent for benefit verification

## 2024-08-12 ENCOUNTER — Other Ambulatory Visit (HOSPITAL_COMMUNITY): Payer: Self-pay

## 2024-08-12 NOTE — Telephone Encounter (Signed)
 Pt ready for scheduling for PROLIA  on or after : 08/12/24  Option# 1: Buy/Bill (Office supplied medication)  Out-of-pocket cost due at time of clinic visit: $377  Number of injection/visits approved: 2  Primary: [employer] Prolia  co-insurance: 20% Admin fee co-insurance: 20%  Secondary: --- Prolia  co-insurance:  Admin fee co-insurance:   Medical Benefit Details: Date Benefits were checked: 08/12/24 Deductible: NO/ Coinsurance: 20%/ Admin Fee: 20%  Prior Auth: APPROVED PA# [id number] Expiration Date: 08/12/24-08/12/25  # of doses approved: 2 ----------------------------------------------------------------------- Option# 2- Med Obtained from pharmacy: Prolia  is no longer preferred for pharmacy benefit. Jubbonti is now preferred. PRICING IS FOR JUBBONTI  Pharmacy benefit: Copay $768.99 (Paid to pharmacy) Admin Fee: 20% (Pay at clinic)  Prior Auth: N/A PA# Expiration Date:   # of doses approved:   If patient wants fill through the pharmacy benefit please send prescription to: [facility], and include estimated need by date in rx notes. Pharmacy will ship medication directly to the office.  Patient NOT eligible for Prolia  Copay Card. Copay Card can make patient's cost as little as $25. Link to apply: [email or web address]://[email or web address]  ** This summary of benefits is an estimation of the patient's out-of-pocket cost. Exact cost may very based on individual plan coverage.

## 2024-08-12 NOTE — Telephone Encounter (Signed)
 MEDICAL PA

## 2024-08-12 NOTE — Telephone Encounter (Signed)
 SABRA

## 2024-08-15 ENCOUNTER — Ambulatory Visit: Admitting: Urology

## 2024-08-17 NOTE — Telephone Encounter (Signed)
 Called patient to discuss cost and schedule appt - LMTCB

## 2024-08-18 NOTE — Telephone Encounter (Signed)
 Tracey Juarez called requesting to speak to [facility], please advise [phone]

## 2024-08-18 NOTE — Telephone Encounter (Signed)
 Called and spoke to Wendy and Yoshiye regarding cost of Prolia . Patient has recent fall and is in rehab center, she will cb to sch appt

## 2024-08-23 NOTE — Telephone Encounter (Signed)
 Patient has recent fall and is currently in rehab - she will call and schedule the appt once she is discharged.

## 2024-08-26 ENCOUNTER — Telehealth: Payer: Self-pay

## 2024-08-26 NOTE — Telephone Encounter (Signed)
 Recent ER visit 1/6 after a fall.  Pt needs a 57m HFU appt. Can be with the provider on call if Dr. Maryanne does not have a 39m appt available in the next 1-2w.

## 2024-08-29 NOTE — Telephone Encounter (Signed)
 Left message last Friday and this am for pt to call back and schedule a HFU appt. Advised that it can be with any provider.

## 2024-09-06 NOTE — Telephone Encounter (Signed)
 2nd message left today for pt to return call and schedule a HFU appt.

## 2024-09-09 NOTE — Telephone Encounter (Signed)
 Closing encounter. Unable to reach pt by phone.

## 2024-09-13 NOTE — Telephone Encounter (Addendum)
 Contacted and spoke with the patient in regard to her upcoming follow up appointment however due to the patient feeling unwell she requested that her friend Sari Simpers, to take down that information. Attempted to contact the patients emergency contact and received a voicemail with no identifiers. Left a message requesting a callback to [phone]. Will attempt to contact again at a later date/time.   Alexa Sweeney V 09/13/2024 9:52 AM ====================================== Patients friend reached back out in regards to her follow up appointment and confirmed [facility] telehealth appointment on 2/11 at 2:15 PM.   Johana Leverne GAILS 09/13/2024 10:35 AM

## 2024-09-15 ENCOUNTER — Ambulatory Visit

## 2024-09-15 DIAGNOSIS — M810 Age-related osteoporosis without current pathological fracture: Secondary | ICD-10-CM

## 2024-09-15 MED ORDER — DENOSUMAB 60 MG/ML ~~LOC~~ SOSY
60.0000 mg | PREFILLED_SYRINGE | Freq: Once | SUBCUTANEOUS | Status: AC
  Administered 2024-09-15: 60 mg via SUBCUTANEOUS

## 2024-09-15 NOTE — Progress Notes (Signed)
 Patient is in office today for a nurse visit for  Prolia injection.   . Patient Injection was given in the  Right arm. Patient tolerated injection well.

## 2024-09-16 ENCOUNTER — Other Ambulatory Visit: Payer: Self-pay | Admitting: Family Medicine

## 2024-09-16 DIAGNOSIS — R42 Dizziness and giddiness: Secondary | ICD-10-CM

## 2024-09-16 DIAGNOSIS — H9312 Tinnitus, left ear: Secondary | ICD-10-CM

## 2024-09-20 ENCOUNTER — Ambulatory Visit

## 2024-12-15 ENCOUNTER — Encounter

## 2024-12-19 ENCOUNTER — Ambulatory Visit: Admitting: Family Medicine

## 2025-01-23 ENCOUNTER — Ambulatory Visit: Admitting: Urology
